# Patient Record
Sex: Male | Born: 1945
Health system: Southern US, Community
[De-identification: ages and names within clinical notes are randomized; demographics above are authoritative.]

## PROBLEM LIST (undated history)

## (undated) DIAGNOSIS — K219 Gastro-esophageal reflux disease without esophagitis: Secondary | ICD-10-CM

## (undated) DIAGNOSIS — F419 Anxiety disorder, unspecified: Secondary | ICD-10-CM

## (undated) DIAGNOSIS — E119 Type 2 diabetes mellitus without complications: Secondary | ICD-10-CM

## (undated) DIAGNOSIS — E669 Obesity, unspecified: Secondary | ICD-10-CM

## (undated) DIAGNOSIS — N200 Calculus of kidney: Secondary | ICD-10-CM

## (undated) DIAGNOSIS — M1990 Unspecified osteoarthritis, unspecified site: Secondary | ICD-10-CM

## (undated) DIAGNOSIS — G4730 Sleep apnea, unspecified: Secondary | ICD-10-CM

## (undated) DIAGNOSIS — D751 Secondary polycythemia: Secondary | ICD-10-CM

## (undated) DIAGNOSIS — I10 Essential (primary) hypertension: Secondary | ICD-10-CM

## (undated) HISTORY — DX: Obesity, unspecified: E66.9

## (undated) HISTORY — DX: Unspecified osteoarthritis, unspecified site: M19.90

## (undated) HISTORY — PX: CHOLECYSTECTOMY: SHX55

## (undated) HISTORY — DX: Sleep apnea, unspecified: G47.30

## (undated) HISTORY — DX: Essential (primary) hypertension: I10

## (undated) HISTORY — DX: Calculus of kidney: N20.0

## (undated) HISTORY — DX: Gastro-esophageal reflux disease without esophagitis: K21.9

## (undated) HISTORY — DX: Type 2 diabetes mellitus without complications: E11.9

## (undated) HISTORY — DX: Secondary polycythemia: D75.1

---

## 1998-06-24 ENCOUNTER — Encounter: Admission: RE | Admit: 1998-06-24 | Discharge: 1998-06-24 | Payer: Self-pay | Admitting: Family Medicine

## 1998-07-02 ENCOUNTER — Encounter: Admission: RE | Admit: 1998-07-02 | Discharge: 1998-07-02 | Payer: Self-pay | Admitting: Family Medicine

## 1998-07-05 ENCOUNTER — Encounter: Admission: RE | Admit: 1998-07-05 | Discharge: 1998-07-05 | Payer: Self-pay | Admitting: Family Medicine

## 1999-05-18 ENCOUNTER — Encounter: Admission: RE | Admit: 1999-05-18 | Discharge: 1999-05-18 | Payer: Self-pay | Admitting: Family Medicine

## 1999-07-13 ENCOUNTER — Encounter: Admission: RE | Admit: 1999-07-13 | Discharge: 1999-07-13 | Payer: Self-pay | Admitting: Family Medicine

## 1999-07-18 ENCOUNTER — Encounter: Admission: RE | Admit: 1999-07-18 | Discharge: 1999-07-18 | Payer: Self-pay | Admitting: Family Medicine

## 1999-08-19 ENCOUNTER — Encounter: Admission: RE | Admit: 1999-08-19 | Discharge: 1999-08-19 | Payer: Self-pay | Admitting: Family Medicine

## 1999-10-31 ENCOUNTER — Encounter: Admission: RE | Admit: 1999-10-31 | Discharge: 1999-10-31 | Payer: Self-pay | Admitting: Family Medicine

## 1999-12-09 ENCOUNTER — Encounter: Admission: RE | Admit: 1999-12-09 | Discharge: 1999-12-09 | Payer: Self-pay | Admitting: Family Medicine

## 1999-12-22 ENCOUNTER — Encounter: Admission: RE | Admit: 1999-12-22 | Discharge: 1999-12-22 | Payer: Self-pay | Admitting: Family Medicine

## 2000-03-01 ENCOUNTER — Encounter: Admission: RE | Admit: 2000-03-01 | Discharge: 2000-03-01 | Payer: Self-pay | Admitting: Family Medicine

## 2000-04-02 ENCOUNTER — Encounter: Admission: RE | Admit: 2000-04-02 | Discharge: 2000-04-02 | Payer: Self-pay | Admitting: Family Medicine

## 2000-05-13 ENCOUNTER — Emergency Department (HOSPITAL_COMMUNITY): Admission: EM | Admit: 2000-05-13 | Discharge: 2000-05-13 | Payer: Self-pay

## 2000-05-31 ENCOUNTER — Encounter: Admission: RE | Admit: 2000-05-31 | Discharge: 2000-05-31 | Payer: Self-pay | Admitting: Family Medicine

## 2000-07-09 ENCOUNTER — Ambulatory Visit (HOSPITAL_COMMUNITY): Admission: RE | Admit: 2000-07-09 | Discharge: 2000-07-09 | Payer: Self-pay | Admitting: Sports Medicine

## 2000-07-12 ENCOUNTER — Encounter: Admission: RE | Admit: 2000-07-12 | Discharge: 2000-07-12 | Payer: Self-pay | Admitting: Family Medicine

## 2000-07-23 ENCOUNTER — Encounter: Admission: RE | Admit: 2000-07-23 | Discharge: 2000-07-23 | Payer: Self-pay | Admitting: Family Medicine

## 2000-10-05 ENCOUNTER — Encounter: Admission: RE | Admit: 2000-10-05 | Discharge: 2000-10-05 | Payer: Self-pay | Admitting: Family Medicine

## 2000-11-06 ENCOUNTER — Encounter: Admission: RE | Admit: 2000-11-06 | Discharge: 2000-11-06 | Payer: Self-pay | Admitting: Sports Medicine

## 2001-02-19 ENCOUNTER — Encounter: Admission: RE | Admit: 2001-02-19 | Discharge: 2001-02-19 | Payer: Self-pay | Admitting: Family Medicine

## 2001-04-30 ENCOUNTER — Encounter: Admission: RE | Admit: 2001-04-30 | Discharge: 2001-04-30 | Payer: Self-pay | Admitting: Family Medicine

## 2001-06-27 ENCOUNTER — Encounter: Admission: RE | Admit: 2001-06-27 | Discharge: 2001-06-27 | Payer: Self-pay | Admitting: Family Medicine

## 2001-08-29 ENCOUNTER — Encounter: Admission: RE | Admit: 2001-08-29 | Discharge: 2001-08-29 | Payer: Self-pay | Admitting: Family Medicine

## 2002-01-02 ENCOUNTER — Encounter: Admission: RE | Admit: 2002-01-02 | Discharge: 2002-01-02 | Payer: Self-pay | Admitting: Family Medicine

## 2002-02-18 ENCOUNTER — Encounter: Admission: RE | Admit: 2002-02-18 | Discharge: 2002-02-18 | Payer: Self-pay | Admitting: Family Medicine

## 2002-06-09 ENCOUNTER — Ambulatory Visit (HOSPITAL_COMMUNITY): Admission: RE | Admit: 2002-06-09 | Discharge: 2002-06-09 | Payer: Self-pay | Admitting: *Deleted

## 2002-06-09 ENCOUNTER — Encounter (INDEPENDENT_AMBULATORY_CARE_PROVIDER_SITE_OTHER): Payer: Self-pay | Admitting: *Deleted

## 2003-02-10 ENCOUNTER — Encounter: Admission: RE | Admit: 2003-02-10 | Discharge: 2003-02-10 | Payer: Self-pay | Admitting: Family Medicine

## 2003-11-23 ENCOUNTER — Encounter: Admission: RE | Admit: 2003-11-23 | Discharge: 2003-11-23 | Payer: Self-pay | Admitting: Family Medicine

## 2004-12-29 ENCOUNTER — Ambulatory Visit: Payer: Self-pay | Admitting: Family Medicine

## 2005-02-10 ENCOUNTER — Ambulatory Visit: Payer: Self-pay | Admitting: Family Medicine

## 2005-04-27 ENCOUNTER — Ambulatory Visit: Payer: Self-pay | Admitting: Family Medicine

## 2005-10-24 ENCOUNTER — Ambulatory Visit: Payer: Self-pay | Admitting: Family Medicine

## 2006-02-13 ENCOUNTER — Ambulatory Visit: Payer: Self-pay | Admitting: Family Medicine

## 2006-06-14 DIAGNOSIS — E669 Obesity, unspecified: Secondary | ICD-10-CM | POA: Insufficient documentation

## 2006-06-14 DIAGNOSIS — N200 Calculus of kidney: Secondary | ICD-10-CM | POA: Insufficient documentation

## 2006-06-14 DIAGNOSIS — J309 Allergic rhinitis, unspecified: Secondary | ICD-10-CM | POA: Insufficient documentation

## 2006-06-14 DIAGNOSIS — I10 Essential (primary) hypertension: Secondary | ICD-10-CM | POA: Insufficient documentation

## 2006-06-14 DIAGNOSIS — G4733 Obstructive sleep apnea (adult) (pediatric): Secondary | ICD-10-CM | POA: Insufficient documentation

## 2006-10-11 ENCOUNTER — Ambulatory Visit: Payer: Self-pay | Admitting: Family Medicine

## 2006-10-11 ENCOUNTER — Ambulatory Visit (HOSPITAL_COMMUNITY): Admission: RE | Admit: 2006-10-11 | Discharge: 2006-10-11 | Payer: Self-pay | Admitting: Family Medicine

## 2006-10-11 LAB — CONVERTED CEMR LAB
BUN: 17 mg/dL (ref 6–23)
CO2: 26 meq/L (ref 19–32)
Calcium: 9.4 mg/dL (ref 8.4–10.5)
Chloride: 97 meq/L (ref 96–112)
Cholesterol: 166 mg/dL (ref 0–200)
Creatinine, Ser: 1.29 mg/dL (ref 0.40–1.50)
Glucose, Bld: 98 mg/dL (ref 70–99)
HDL: 54 mg/dL (ref >39)
LDL Cholesterol: 102 mg/dL — ABNORMAL HIGH (ref 0–99)
PSA: 0.69 ng/mL (ref 0.10–4.00)
Potassium: 4.2 meq/L (ref 3.5–5.3)
Sodium: 139 meq/L (ref 135–145)
Total CHOL/HDL Ratio: 3.1
Triglycerides: 49 mg/dL (ref <150)
VLDL: 10 mg/dL (ref 0–40)

## 2006-10-15 ENCOUNTER — Encounter: Payer: Self-pay | Admitting: Family Medicine

## 2006-10-25 ENCOUNTER — Encounter: Payer: Self-pay | Admitting: Family Medicine

## 2006-11-05 ENCOUNTER — Encounter: Payer: Self-pay | Admitting: Family Medicine

## 2006-11-09 ENCOUNTER — Ambulatory Visit: Payer: Self-pay | Admitting: Family Medicine

## 2006-11-09 ENCOUNTER — Encounter: Payer: Self-pay | Admitting: Family Medicine

## 2006-11-09 LAB — CONVERTED CEMR LAB
HCT: 50.5 % (ref 39.0–52.0)
Hemoglobin: 17.3 g/dL — ABNORMAL HIGH (ref 13.0–17.0)
MCHC: 34.3 g/dL (ref 30.0–36.0)
MCV: 91.8 fL (ref 78.0–100.0)
Platelets: 254 10*3/uL (ref 150–400)
RBC: 5.5 M/uL (ref 4.22–5.81)
RDW: 13.2 % (ref 11.5–14.0)
WBC: 9.7 10*3/uL (ref 4.0–10.5)

## 2006-11-12 ENCOUNTER — Encounter: Payer: Self-pay | Admitting: Family Medicine

## 2006-12-18 ENCOUNTER — Ambulatory Visit (HOSPITAL_COMMUNITY): Admission: RE | Admit: 2006-12-18 | Discharge: 2006-12-18 | Payer: Self-pay | Admitting: Orthopaedic Surgery

## 2007-02-18 ENCOUNTER — Ambulatory Visit: Payer: Self-pay | Admitting: Family Medicine

## 2007-03-05 ENCOUNTER — Ambulatory Visit (HOSPITAL_BASED_OUTPATIENT_CLINIC_OR_DEPARTMENT_OTHER): Admission: RE | Admit: 2007-03-05 | Discharge: 2007-03-05 | Payer: Self-pay | Admitting: Orthopaedic Surgery

## 2007-05-27 ENCOUNTER — Ambulatory Visit: Payer: Self-pay | Admitting: Family Medicine

## 2007-05-28 ENCOUNTER — Telehealth: Payer: Self-pay | Admitting: *Deleted

## 2007-07-11 ENCOUNTER — Telehealth (INDEPENDENT_AMBULATORY_CARE_PROVIDER_SITE_OTHER): Payer: Self-pay | Admitting: *Deleted

## 2007-11-25 ENCOUNTER — Ambulatory Visit: Payer: Self-pay | Admitting: Family Medicine

## 2007-11-25 DIAGNOSIS — R609 Edema, unspecified: Secondary | ICD-10-CM | POA: Insufficient documentation

## 2007-11-25 LAB — CONVERTED CEMR LAB
ALT: 51 units/L (ref 0–53)
AST: 30 units/L (ref 0–37)
Albumin: 4.4 g/dL (ref 3.5–5.2)
Alkaline Phosphatase: 96 units/L (ref 39–117)
BUN: 13 mg/dL (ref 6–23)
CO2: 26 meq/L (ref 19–32)
Calcium: 9.5 mg/dL (ref 8.4–10.5)
Chloride: 101 meq/L (ref 96–112)
Creatinine, Ser: 1.28 mg/dL (ref 0.40–1.50)
Glucose, Bld: 111 mg/dL — ABNORMAL HIGH (ref 70–99)
HCT: 50.5 % (ref 39.0–52.0)
Hemoglobin: 17.6 g/dL — ABNORMAL HIGH (ref 13.0–17.0)
MCHC: 34.9 g/dL (ref 30.0–36.0)
MCV: 88.3 fL (ref 78.0–100.0)
PSA: 0.72 ng/mL (ref 0.10–4.00)
Platelets: 247 10*3/uL (ref 150–400)
Potassium: 4.1 meq/L (ref 3.5–5.3)
Pro B Natriuretic peptide (BNP): 26 pg/mL (ref 0.0–100.0)
RBC: 5.72 M/uL (ref 4.22–5.81)
RDW: 13.1 % (ref 11.5–15.5)
Sodium: 143 meq/L (ref 135–145)
Total Bilirubin: 1.5 mg/dL — ABNORMAL HIGH (ref 0.3–1.2)
Total Protein: 7.5 g/dL (ref 6.0–8.3)
WBC: 10.6 10*3/uL — ABNORMAL HIGH (ref 4.0–10.5)

## 2007-11-26 ENCOUNTER — Encounter: Payer: Self-pay | Admitting: Family Medicine

## 2008-02-24 ENCOUNTER — Ambulatory Visit: Payer: Self-pay | Admitting: Family Medicine

## 2008-02-24 ENCOUNTER — Encounter: Admission: RE | Admit: 2008-02-24 | Discharge: 2008-02-24 | Payer: Self-pay | Admitting: Family Medicine

## 2008-02-24 DIAGNOSIS — R51 Headache: Secondary | ICD-10-CM | POA: Insufficient documentation

## 2008-02-24 DIAGNOSIS — R519 Headache, unspecified: Secondary | ICD-10-CM | POA: Insufficient documentation

## 2008-02-26 ENCOUNTER — Telehealth: Payer: Self-pay | Admitting: *Deleted

## 2008-04-17 HISTORY — PX: HAND SURGERY: SHX662

## 2009-02-02 ENCOUNTER — Emergency Department (HOSPITAL_COMMUNITY): Admission: EM | Admit: 2009-02-02 | Discharge: 2009-02-02 | Payer: Self-pay | Admitting: Emergency Medicine

## 2009-02-15 ENCOUNTER — Ambulatory Visit: Payer: Self-pay | Admitting: Family Medicine

## 2009-02-15 DIAGNOSIS — M549 Dorsalgia, unspecified: Secondary | ICD-10-CM

## 2009-02-15 LAB — CONVERTED CEMR LAB
BUN: 19 mg/dL (ref 6–23)
CO2: 28 meq/L (ref 19–32)
Calcium: 9.8 mg/dL (ref 8.4–10.5)
Chloride: 101 meq/L (ref 96–112)
Creatinine, Ser: 1.19 mg/dL (ref 0.40–1.50)
Glucose, Bld: 101 mg/dL — ABNORMAL HIGH (ref 70–99)
PSA: 0.94 ng/mL (ref 0.10–4.00)
Potassium: 3.8 meq/L (ref 3.5–5.3)
Sodium: 142 meq/L (ref 135–145)

## 2009-02-16 ENCOUNTER — Encounter: Payer: Self-pay | Admitting: Family Medicine

## 2009-05-31 ENCOUNTER — Ambulatory Visit: Payer: Self-pay | Admitting: Family Medicine

## 2010-01-31 ENCOUNTER — Ambulatory Visit: Payer: Self-pay | Admitting: Family Medicine

## 2010-02-09 ENCOUNTER — Encounter: Payer: Self-pay | Admitting: Family Medicine

## 2010-02-09 ENCOUNTER — Ambulatory Visit: Payer: Self-pay | Admitting: Family Medicine

## 2010-05-19 ENCOUNTER — Encounter: Payer: Self-pay | Admitting: *Deleted

## 2010-05-19 NOTE — Assessment & Plan Note (Signed)
Summary: zostavx vaccine/bmc  Nurse Visit   Vital Signs:  Patient profile:   65 year old male Temp:     98.5 degrees F  Vitals Entered By: Theresia Lo RN (February 09, 2010 12:05 PM)  Allergies: No Known Drug Allergies  Immunizations Administered:  Zostavax # 1:    Vaccine Type: Zostavax    Site: Left arm    Mfr: Merck    Dose: 0.65 ml    Route: IM    Given by: Theresia Lo RN    Exp. Date: 05/01/2010    Lot #: [id number]    VIS given: 01/27/05 given February 09, 2010.  Orders Added: 1)  Zoster (Shingles) Vaccine Live [90736] 2)  Admin 1st Vaccine [phone]

## 2010-05-19 NOTE — Assessment & Plan Note (Signed)
Summary: f/u,df   Vital Signs:  Patient profile:   65 year old male Height:      67.5 inches Weight:      222.4 pounds BMI:     34.44 Temp:     97.9 degrees F oral Pulse rate:   52 / minute BP sitting:   137 / 83  (left arm) Cuff size:   regular  Vitals Entered By: Garen Grams LPN (January 31, 2010 4:04 PM) CC: f/u Pain Assessment Patient in pain? yes     Location: right hip   CC:  f/u.  History of Present Illness:  BACK PAIN (ICD-724.5) Was better for a few months but now has returned.  Worse with walking.  As per other episodes the  pain in right sciatic area with radiation down leg  Has had on/off for years since accident in 1980s Tylenol helps with pain some.  No weakness of legs or incontinence of bowel or bladder   HYPERTENSION, BENIGN SYSTEMIC (ICD-401.1) Disease Monitoring   Blood pressure range:  not measuring regularly     Chest pain: N     Dyspnea:N Medications   Compliance: daily   Lightheadedness: N     Edema:None Has lost 20 lbs since last visit!  Edema resolved No shortness of breath or orthopnea   ROS - as above PMH - Medications reviewed and updated in medication list.  Smoking Status noted in VS form       Habits & Providers  Alcohol-Tobacco-Diet     Tobacco Status: never  Current Medications (verified): 1)  Ibuprofen 600 Mg  Tabs (Ibuprofen) .Marland Kitchen.. 1 By Mouth Three Times A Day With Food 2)  Hydrochlorothiazide 25 Mg  Tabs (Hydrochlorothiazide) .... Take 1 Tab By Mouth Every Morning  Allergies: No Known Drug Allergies  Physical Exam  General:  Well-developed,well-nourished,in no acute distress; alert,appropriate and cooperative throughout examination Msk:  Tender over R sacral paraspinous area.  No deformity.  + SLR on R at 80 degrees  Neurologic:  Able to walk on tip toes and heels.  Romberg normal.      Impression & Recommendations:  Problem # 1:  BACK PAIN (ICD-724.5) Assessment Deteriorated  seems most consistent with  sciatica or disc.  He is not interested in surgery currently.  No red flags.  Will try gabapentin and continue tylenol His updated medication list for this problem includes:    Ibuprofen 600 Mg Tabs (Ibuprofen) .Marland Kitchen... 1 by mouth three times a day with food  Orders: [facility]- Est  Level 4 (82956)  Problem # 2:  HYPERTENSION, BENIGN SYSTEMIC (ICD-401.1) Assessment: Improved  improving lost likely due to continued weight loss.  Maybe able to stop medications if can get below 200 lb (his goal) His updated medication list for this problem includes:    Hydrochlorothiazide 25 Mg Tabs (Hydrochlorothiazide) .Marland Kitchen... Take 1 tab by mouth every morning  BP today: 137/83 Prior BP: 150/90 (05/31/2009)  Labs Reviewed: K+: 3.8 (02/15/2009) Creat: : 1.19 (02/15/2009)   Chol: 166 (10/11/2006)   HDL: 54 (10/11/2006)   LDL: 102 (10/11/2006)   TG: 49 (10/11/2006)  Orders: [facility]- Est  Level 4 (21308)  Problem # 3:  EDEMA- LOCALIZED (ICD-782.3) Assessment: Improved nearly gone due to weight loss  His updated medication list for this problem includes:    Hydrochlorothiazide 25 Mg Tabs (Hydrochlorothiazide) .Marland Kitchen... Take 1 tab by mouth every morning  Complete Medication List: 1)  Ibuprofen 600 Mg Tabs (Ibuprofen) .Marland Kitchen.. 1 by mouth three times a day  with food 2)  Hydrochlorothiazide 25 Mg Tabs (Hydrochlorothiazide) .... Take 1 tab by mouth every morning 3)  Gabapentin 100 Mg Caps (Gabapentin) .Marland Kitchen.. 1 three times a day for pain 4)  Triamcinolone Acetonide 0.5 % Oint (Triamcinolone acetonide) .... Apply two times a day to rough areas on elbows and knees.  30 gram  Patient Instructions: 1)  Call me if you have severe pain or any weakness 2)  Use the tylenol as needed for pain and also massage 3)  Use the gabapentin 100 mg once at night for several nights if does not make you sleepy then can take during the day.  If seems to help but makes you sleepy then can take up to 3 at night. 4)  Congrat on the weight loss and blood  pressure control 5)  Consider the [place] Zostavax shot Prescriptions: TRIAMCINOLONE ACETONIDE 0.5 % OINT (TRIAMCINOLONE ACETONIDE) apply two times a day to rough areas on elbows and knees.  30 gram  #1 x 2   Entered and Authorized by:   Pearlean Brownie MD   Signed by:   Pearlean Brownie MD on 01/31/2010   Method used:   Electronically to        [facility] [address] [phone]* (retail)       [address]       [place], [place]  [address]       Ph: [phone]       Fax: [phone]   RxID:   [id number] GABAPENTIN 100 MG CAPS (GABAPENTIN) 1 three times a day for pain  #30 x 1   Entered and Authorized by:   Pearlean Brownie MD   Signed by:   Pearlean Brownie MD on 01/31/2010   Method used:   Electronically to        [facility] [address] [phone]* (retail)       [address]       [place], [place]  [address]       Ph: [phone]       Fax: [phone]   RxID:   [id number]    Orders Added: 1)  [facility]- Est  Level 4 [16010]     Prevention & Chronic Care Immunizations   Influenza vaccine: Fluvax 3+  (02/18/2007)   Influenza vaccine due: 02/18/2008    Tetanus booster: Not documented    Pneumococcal vaccine: Done.  (01/16/2003)   Pneumococcal vaccine due: None    H. zoster vaccine: Not documented  Colorectal Screening   Hemoccult: Done.  (05/18/2000)   Hemoccult due: Not Indicated    Colonoscopy: Done.  (05/18/2002)   Colonoscopy due: 05/18/2012  Other Screening   PSA: 0.94  (02/15/2009)   PSA due due: 10/11/2007   Smoking status: never  (01/31/2010)  Lipids   Total Cholesterol: 166  (10/11/2006)   LDL: 102  (10/11/2006)   LDL Direct: Not documented   HDL: 54  (10/11/2006)   Triglycerides: 49  (10/11/2006)  Hypertension   Last Blood Pressure: 137 / 83  (01/31/2010)   Serum creatinine: 1.19  (02/15/2009)   Serum potassium 3.8  (02/15/2009)    Hypertension flowsheet reviewed?: Yes   Progress toward BP goal: At goal  Self-Management Support :    Personal Goals (by the next clinic visit) :      Personal blood pressure goal: 140/90  (02/15/2009)   Hypertension self-management support: BP self-monitoring log, Written self-care plan  (02/15/2009)

## 2010-05-19 NOTE — Miscellaneous (Signed)
Summary: ABN  ABN   Imported By: De Nurse 02/10/2010 15:13:47  _____________________________________________________________________  External Attachment:    Type:   Image     Comment:   External Document

## 2010-05-19 NOTE — Assessment & Plan Note (Signed)
Summary: f/up,tcb   Vital Signs:  Patient profile:   65 year old male Height:      67.5 inches Weight:      243.1 pounds BMI:     37.65 Temp:     97.5 degrees F oral Pulse rate:   60 / minute BP sitting:   150 / 90  (left arm) Cuff size:   large  Vitals Entered By: Garen Grams LPN (May 31, 2009 3:28 PM)  Serial Vital Signs/Assessments:  Comments: 4:03 PM Manual BP: 150/88 By: Garen Grams LPN   CC: lower back pain with numbness in feet Is Patient Diabetic? No Pain Assessment Patient in pain? yes     Location: lower back Intensity: 6   CC:  lower back pain with numbness in feet.  History of Present Illness: Current Problems:  BACK PAIN (ICD-724.5) has pain in right sciatic area and now with more tingly numbness in his R foot.  Has had for years since accident in 64s but thinks maybe a bit worse.  Usually gets relief with motrin over the counter.  Exercise makes feel better  FACIAL PAIN (ICD-784.0) still pain in R jaw TMJ area.  Is better with motrin.  Worse with chewing.  CT last year was negative.  No hearing problems.  Has not seen dentist  HYPERTENSION, BENIGN SYSTEMIC (ICD-401.1) Disease Monitoring   Blood pressure range:  not measuring regularly     Chest pain: N     Dyspnea:N Medications   Compliance: daily   Lightheadedness: N     Edema:trace in legs at end of day     Habits & Providers  Alcohol-Tobacco-Diet     Tobacco Status: never  Current Medications (verified): 1)  Ibuprofen 600 Mg  Tabs (Ibuprofen) .Marland Kitchen.. 1 By Mouth Three Times A Day With Food 2)  Hydrochlorothiazide 25 Mg  Tabs (Hydrochlorothiazide) .... Take 1 Tab By Mouth Every Morning  Allergies: No Known Drug Allergies  Physical Exam  General:  Well-developed,well-nourished,in no acute distress; alert,appropriate and cooperative throughout examination    Ears:  External ear exam shows no significant lesions or deformities.  Otoscopic examination reveals clear canals, tympanic  membranes are intact bilaterally without bulging, retraction, inflammation or discharge. Hearing is grossly normal bilaterally. Mouth:  Tender over R TMJ area worse with movement No masses  Lungs:  Normal respiratory effort, chest expands symmetrically. Lungs are clear to auscultation, no crackles or wheezes. Heart:  Normal rate and regular rhythm. S1 and S2 normal without gallop, murmur, click, rub or other extra sounds. Extremities:  No straight leg raise pain on R, no skin breakdown on foot, sparse hair growth No palpable pulse  Neurologic:   Able to walk on tip toes and heels.   Monofilament sensation is decreased on R sole compared to L    Impression & Recommendations:  Problem # 1:  BACK PAIN (ICD-724.5) Assessment Unchanged  Most consistent with continued problems from old disc injury in 27s.  Does not have any signs of neuropathy.  Has scant pulses but alble to exercise well without pain.  He is not interested in surgery or imaging but will consider these if worsening Would start with vascular evaluation His updated medication list for this problem includes:    Ibuprofen 600 Mg Tabs (Ibuprofen) .Marland Kitchen... 1 by mouth three times a day with food  Orders: [facility]- Est  Level 4 (84132)  Problem # 2:  FACIAL PAIN (ICD-784.0)  consistent with TMJ syndrome.  Negative CT in past.  Suggested analgesics and to see his dentist  His updated medication list for this problem includes:    Ibuprofen 600 Mg Tabs (Ibuprofen) .Marland Kitchen... 1 by mouth three times a day with food  Orders: [facility]- Est  Level 4 (99214)  Problem # 3:  HYPERTENSION, BENIGN SYSTEMIC (ICD-401.1)  Will not increase medications as long as losing weight and exercising.  continue to monitor  His updated medication list for this problem includes:    Hydrochlorothiazide 25 Mg Tabs (Hydrochlorothiazide) .Marland Kitchen... Take 1 tab by mouth every morning  BP today: 150/90 Prior BP: 160/94 (02/15/2009)  Labs Reviewed: K+: 3.8 (02/15/2009) Creat: :  1.19 (02/15/2009)   Chol: 166 (10/11/2006)   HDL: 54 (10/11/2006)   LDL: 102 (10/11/2006)   TG: 49 (10/11/2006)  Orders: [facility]- Est  Level 4 (16109)  Problem # 4:  OBESITY, NOS (ICD-278.00) lost another 5 lbs.  His goal is to weigh 200.  He would like to see a dietician to help with food choices.  Discussed planning his meals and making on the weekend since he is too tired to cook well during the week.    Complete Medication List: 1)  Ibuprofen 600 Mg Tabs (Ibuprofen) .Marland Kitchen.. 1 by mouth three times a day with food 2)  Hydrochlorothiazide 25 Mg Tabs (Hydrochlorothiazide) .... Take 1 tab by mouth every morning  Patient Instructions: 1)  Needs appointment with Dr Gerily[facility] 2)  Keep a record of your blood pressure readings if regularly > 150/90 call us and bring in next visit 3)  Come back in 6 mo to check blood pressure and back 4)  If back pain is worseing or if numbness is worse call us - would get blood vessel first

## 2010-07-21 LAB — POCT URINALYSIS DIP (DEVICE)
Ketones, ur: NEGATIVE mg/dL
Protein, ur: NEGATIVE mg/dL
pH: 5 (ref 5.0–8.0)

## 2010-08-30 NOTE — Op Note (Signed)
NAME:  Donald Jones, Donald Jones NO.:  [id number]   MEDICAL RECORD NO.:  [id number]          PATIENT TYPE:  AMB   LOCATION:  DSC                          FACILITY:  [facility]   PHYSICIAN:  Vanita Panda. Magnus Ivan, M.D.DATE OF BIRTH:  May 03, 1945   DATE OF PROCEDURE:  03/05/2007  DATE OF DISCHARGE:  03/05/2007                               OPERATIVE REPORT   PREOPERATIVE DIAGNOSIS:  Right index finger irritation with infection  versus retained suture.   POSTOPERATIVE DIAGNOSIS:  Right index finger soft tissue reaction from  retained suture.   PROCEDURE:  Irrigation and debridement of right index finger with  removal of retained suture.   SURGEON:  Vanita Panda. Magnus Ivan, M.D.   ANESTHESIA:  Regional Bier block, right upper extremity.   BLOOD LOSS:  Minimal.   ANTIBIOTICS:  1 g IV Ancef.   COMPLICATIONS:  None.   INDICATIONS:  Briefly, Donald Jones is a 65 year old right-hand-dominant  male who just a few months ago injured his right index finger in a work-  related incident, and he had a partial extensor tendon injury, and he  was taken to the operating room and had repair of the extensor tendon.  He subsequently did well with therapy and had gone back to its baseline  level of activity, but he continued to have a chronic wound develop over  the MCP joint at the site of the suture repair.  We tried whirlpool  treatments on this through [facility], and he was treated  with a course of antibiotics, but he never did seem to be infected, but  I felt like this was more of a irritation from retained suture and his  body's reaction to a retained suture.  I recommended he undergo  exploration of the wound for assessment of infection versus removal of  retained suture.  The risks and benefits of this were explained to him  who understood and he agreed to proceed with surgery.   PROCEDURE:  After informed consent was obtained, the appropriate right  arm was marked,  he was brought to the operating room and placed upon the  operating table.  Bier block was obtained of the right upper extremity  by anesthesia.  His skin was prepped and draped with DuraPrep and  sterile drapes, a time-out was called to identify the correct patient  and correct extremity.  I then used a 15-blade and made incision  directly over the wound at the level of the irritated tissue.  I took  this down to the deep tissues of the extensor tendon.  There was a large  retained Ethibond suture and I believe this was what was causing the  reaction of the soft tissues.  There was a large knot to this and I  removed this sharply with a knife.  There was no evidence of infection  whatsoever.  I used a rongeur to debride irritated tissue in order to  get a good bleeding healing response.  Since he was awake during the  case, I had him flex and extend the finger and the extensor tendon  repair held and he had good motion.  I then irrigated the wound  copiously with normal saline solution and then closed the skin with  interrupted 3-0 nylon suture in a simple format.  The wound was  infiltrated with 0.25% plain Marcaine.  I then placed Xeroform and a  well-padded sterile dressing on the finger.  The tourniquet was let down  at 20 minutes.  Hemostasis was obtained and the fingers did  pinken nicely.  He was taken to the recovery room in stable condition.  Postoperatively I will allow him to use the hand.  Sutures will remain  in place for 2 weeks.  I will keep him out of work for the rest of this  week and I will let him resume strenuous activity in another 2 weeks.      Vanita Panda. Magnus Ivan, M.D.  Electronically Signed     CYB/MEDQ  D:  03/05/2007  T:  03/06/2007  Job:  [id number]

## 2010-08-30 NOTE — Op Note (Signed)
NAME:  Donald Jones, Donald Jones NO.:  [id number]   MEDICAL RECORD NO.:  [id number]          PATIENT TYPE:  AMB   LOCATION:  SDS                          FACILITY:  [facility]   PHYSICIAN:  Vanita Panda. Magnus Ivan, M.D.DATE OF BIRTH:  13-Dec-1945   DATE OF PROCEDURE:  12/18/2006  DATE OF DISCHARGE:                               OPERATIVE REPORT   PREOPERATIVE DIAGNOSIS:  Right index finger extensor tendon laceration.   POSTOPERATIVE DIAGNOSIS:  Right index finger 70% laceration of extensor  tendon/hood.   PROCEDURE:  Direct primary repair of right index finger extensor tendon  laceration.   SURGEON:  Vanita Panda. Magnus Ivan, M.D.   ANESTHESIA:  General.   ANTIBIOTICS:  1 gram IV Ancef.   ESTIMATED BLOOD LOSS:  Minimal.   COMPLICATIONS:  None.   TOURNIQUET TIME:  12 minutes.   INDICATIONS:  Briefly, [name] is a 61-year right hand dominant  individual who injured his index finger at work last Thursday when he  was cut with a metal object.  He was seen at an Ur[facility] and  the physician there told me that there was a laceration of at least 50-  75% of the extensor tendon at the MCP joint.  The soft tissue was closed  with sutures, he was placed in a sling, and given follow up in my  office.  Given the extent of the injury, I recommend that he undergo  direct primary repair. The risks and benefits of this were explained,  were well understood, and he agreed to proceed with surgery.   DESCRIPTION OF PROCEDURE:  After informed consent was obtained and the  appropriate right hand was marked, [name] was brought to the  operating room and placed supine on the operating table.  General  anesthesia was then obtained. A nonsterile tourniquet was placed around  his upper arm and his right hand was prepped and draped with Betadine  scrub and paint.  A time out was called to identify the correct patient  and correct extremity.  We then elevated the tourniquet to  250 mmHg.  The small wound on the dorsum of the hand was transverse at the MCP  joint.  I extended this proximally and distally and at the extensor  hood, you could see that the ulnar side was completely lacerated up  until the mid portion of the tendon.  I was able to irrigate and clean  the tissues and I brought the extensor hood back together using  interrupted 3-0 Ethibond suture.  I irrigated the tissues again and  closed the skin with interrupted 3-0 Prolene suture.  Xeroform followed  by a well padded sterile dressing and splint were placed on the finger.  The tourniquet was let down 12 minutes and the fingers pinkened nicely.  The patient was awakened, extubated, and taken to the recovery room in  stable condition.   Postoperatively, I will put him on an extensor tendon repair protocol  but it will likely be a shortened protocol since the tendon was about  75% lacerated, but it was more the extensor hood,  will probable put him  quickly into the dynamic active passive splinting.      Vanita Panda. Magnus Ivan, M.D.  Electronically Signed     CYB/MEDQ  D:  12/18/2006  T:  12/18/2006  Job:  [id number]

## 2010-12-26 ENCOUNTER — Encounter: Payer: Self-pay | Admitting: Internal Medicine

## 2011-01-17 ENCOUNTER — Encounter: Payer: Self-pay | Admitting: Family Medicine

## 2011-01-17 ENCOUNTER — Ambulatory Visit (INDEPENDENT_AMBULATORY_CARE_PROVIDER_SITE_OTHER): Payer: BC Managed Care – PPO | Admitting: Internal Medicine

## 2011-01-17 ENCOUNTER — Encounter: Payer: Self-pay | Admitting: Internal Medicine

## 2011-01-17 VITALS — BP 124/78 | HR 61 | Temp 98.2°F | Resp 14 | Ht 66.75 in | Wt 218.5 lb

## 2011-01-17 DIAGNOSIS — I10 Essential (primary) hypertension: Secondary | ICD-10-CM

## 2011-01-17 DIAGNOSIS — Z23 Encounter for immunization: Secondary | ICD-10-CM

## 2011-01-17 DIAGNOSIS — R51 Headache: Secondary | ICD-10-CM

## 2011-01-17 MED ORDER — HYDROCHLOROTHIAZIDE 25 MG PO TABS: 25.0000 mg | ORAL_TABLET | Freq: Every day | ORAL | Status: DC

## 2011-01-17 NOTE — Assessment & Plan Note (Signed)
Pain c/w TMJ rec advil as he is doing, ice, see a dentist

## 2011-01-17 NOTE — Progress Notes (Signed)
  Subjective:    Patient ID: Donald Jones, male    DOB: [birth year], 65 y.o.   MRN: [id number]  HPI New patient, to get establish Hypertension, good medication compliance. Sleep apnea, he used to use a CPAP 5 years ago decided to loose wt, he reports a 80 pound weight loss, since then he is feeling well and discontinued his CPAP. Also complaining of pain in the right TMJ, worse when chewing. He does not have a dentist, he is edentulous , has removable partials   Past Medical History  Diagnosis Date  . Sleep apnea     not using a CPAP  since ~ 2007  . Hypertension   . Obesity   . Kidney stones     several episodes    Past Surgical History  Procedure Date  . Cholecystectomy   . Hand surgery 2010    R hand , d/t a injury, 3 surgeries    History   Social History  . Marital Status: Married    Spouse Name: N/A    Number of Children: 3   . 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 Smoking status: Never Smoker   . Smokeless tobacco: Never Used  . Alcohol Use: No  . Drug Use: No  . Sexually Active: Not on file   Other Topics Concern  . Not on file   Social History Narrative   1ST WIFE DIED FROM LUNG CANCER, 2ND WIFE DIED FROM RENAL CANCER. NOW IS ENGAGED .   Family History  Problem Relation Age of Onset  . Diabetes Mother   . Stroke      M and F  . Coronary artery disease      M?  . Colon cancer Neg Hx   . Prostate cancer Neg Hx      Review of Systems Denies any chest pain, shortness of breath No nausea, vomiting, diarrhea.     Objective:   Physical Exam  Constitutional: He is oriented to person, place, and time. He appears well-developed. No distress.  HENT:  Head: Normocephalic and atraumatic.  Right Ear: External ear normal.       Mouth opening is limited, removable denture seems loose. Throat without redness or discharge. TMJ slightly tender on the right, no click  Cardiovascular: Normal rate,  regular rhythm and normal heart sounds.   No murmur heard. Pulmonary/Chest: Breath sounds normal. No respiratory distress. He has no wheezes. He has no rales.  Musculoskeletal: He exhibits no edema.  Neurological: He is alert and oriented to person, place, and time.  Skin: He is not diaphoretic.          Assessment & Plan:

## 2011-01-17 NOTE — Patient Instructions (Signed)
Advil, ice at night, see a dentist    TMJ Problems  (Temporal Mandibular Joint Dysfunction) TMJ dysfunction means there are problems with the joint between your jaw and your skull. This is a joint lined by cartilage like other joints in your body but also has a small disc in the joint which keeps the bones from rubbing on each other. These joints are like other joints and can get inflamed (sore) from arthritis and other problems. When this joint gets sore, it can cause headaches and pain in the jaw and the face. CAUSES Usually the arthritic types of problems are caused by soreness in the joint. Soreness in the joint can also be caused by overuse. This may come from grinding your teeth. It may also come from mis-alignment in the joint. DIAGNOSIS (LEARNING WHAT IS WRONG) Diagnosis of this condition can often be made by history and exam. Sometimes your caregiver may need X-rays or an MRI scan to determine the exact cause. It may be necessary to see your dentist to determine if your teeth and jaws are lined up correctly. TREATMENT Most of the time this problem is not serious; however, sometimes it can persist (become chronic). When this happens medications that will cut down on inflammation (soreness) help. Sometimes a shot of cortisone into the joint will be helpful. If your teeth are not aligned it may help for your dentist to make a splint for your mouth that can help this problem. If no physical problems can be found, the problem may come from tension. If tension is found to be the cause, biofeedback or relaxation techniques may be helpful. HOME CARE INSTRUCTIONS  Later in the day, applications of ice packs may be helpful. Ice can be used in a plastic bag with a towel around it to prevent frostbite to skin. This may be used about every 2 hours for 20 to 30 minutes, as needed while awake, or as directed by your caregiver.   Only take over-the-counter or prescription medicines for pain, discomfort, or  fever as directed by your caregiver.   If physical therapy was prescribed, follow your caregiver's directions.   Wear mouth appliances as directed if they were given.  Document Released: 12/27/2000 Document Re-Released: 06/30/2008 [facility] Patient Information 2011 [place], [place].

## 2011-01-17 NOTE — Assessment & Plan Note (Signed)
Reports good compliance with medications, BP today normal. Will refill his medications, labs

## 2011-01-18 ENCOUNTER — Telehealth: Payer: Self-pay | Admitting: *Deleted

## 2011-01-18 LAB — CBC WITH DIFFERENTIAL/PLATELET
Basophils Absolute: 0 10*3/uL (ref 0.0–0.1)
Eosinophils Absolute: 0.3 10*3/uL (ref 0.0–0.7)
Lymphocytes Relative: 54.8 % — ABNORMAL HIGH (ref 12.0–46.0)
MCHC: 33.2 g/dL (ref 30.0–36.0)
MCV: 96.4 fl (ref 78.0–100.0)
Monocytes Absolute: 0.7 10*3/uL (ref 0.1–1.0)
Neutrophils Relative %: 26.4 % — ABNORMAL LOW (ref 43.0–77.0)
Platelets: 216 10*3/uL (ref 150.0–400.0)
RDW: 13.3 % (ref 11.5–14.6)

## 2011-01-18 LAB — BASIC METABOLIC PANEL
BUN: 15 mg/dL (ref 6–23)
CO2: 30 mEq/L (ref 19–32)
Calcium: 9.7 mg/dL (ref 8.4–10.5)
Chloride: 102 mEq/L (ref 96–112)
Creatinine, Ser: 1.2 mg/dL (ref 0.4–1.5)
Glucose, Bld: 102 mg/dL — ABNORMAL HIGH (ref 70–99)

## 2011-01-18 NOTE — Telephone Encounter (Signed)
MD informed

## 2011-01-20 NOTE — Progress Notes (Signed)
Quick Note:    Labs mailed.  ______

## 2011-01-24 LAB — BASIC METABOLIC PANEL
CO2: 33 — ABNORMAL HIGH
Calcium: 9.5
Creatinine, Ser: 1.35
GFR calc Af Amer: >60
GFR calc non Af Amer: 54 — ABNORMAL LOW
Sodium: 141

## 2011-01-24 LAB — POCT HEMOGLOBIN-HEMACUE
Hemoglobin: 19 — ABNORMAL HIGH
Operator id: 128471

## 2011-01-27 LAB — BASIC METABOLIC PANEL
BUN: 11
Calcium: 9.5
GFR calc non Af Amer: >60
Glucose, Bld: 109 — ABNORMAL HIGH
Potassium: 3.9
Sodium: 139

## 2011-01-27 LAB — CBC
HCT: 52.9 — ABNORMAL HIGH
Hemoglobin: 18.2 — ABNORMAL HIGH
Platelets: 274
RDW: 12.9
WBC: 11.3 — ABNORMAL HIGH

## 2011-06-05 ENCOUNTER — Ambulatory Visit (INDEPENDENT_AMBULATORY_CARE_PROVIDER_SITE_OTHER): Payer: BC Managed Care – PPO | Admitting: Internal Medicine

## 2011-06-05 DIAGNOSIS — M21371 Foot drop, right foot: Secondary | ICD-10-CM | POA: Insufficient documentation

## 2011-06-05 DIAGNOSIS — Z0000 Encounter for general adult medical examination without abnormal findings: Secondary | ICD-10-CM

## 2011-06-05 DIAGNOSIS — I10 Essential (primary) hypertension: Secondary | ICD-10-CM

## 2011-06-05 DIAGNOSIS — M549 Dorsalgia, unspecified: Secondary | ICD-10-CM

## 2011-06-05 NOTE — Progress Notes (Signed)
  Subjective:    Patient ID: Donald Jones, male    DOB: [birth year], 66 y.o.   MRN: [id number]  HPI CPX  Past medical history Hypertension Sleep apnea, diagnosed in 16-Sep-2005, not using CPAP Nephrolithiasis Back pain, chronic . Used to get shots  R foot  drop , used to see neurology  Past surgical history Cholecystectomy Hand surgery x3, right side, d/t injury  Social history Moved from [place] in 09/16/1993 1st wife died from lung cancer, 2nd wife died from renal cancer; engaged at present  3 children Occupation Aeronautical engineer Tobacco, never Alcohol, no  FH DM-- M Stroke--M F CAD-- ?M Colon ca--no Prostate ca--no  Review of Systems doing well. He reports that 5 years ago, he decided to exercise more, he lost weight. Since then he is doing better, no chest pain, no shortness of breath. Denies nausea, vomiting, diarrhea. occ sees blood in the toilet paper only with hard bowel movements. No difficulty urinating or blood in the urine. No anxiety or depression. History of foot drop, getting worse?.     Objective:   Physical Exam  Constitutional: He is oriented to person, place, and time. He appears well-developed. No distress.  Neck: No thyromegaly present.  Cardiovascular: Normal rate, regular rhythm and normal heart sounds.   No murmur heard.      Nl femoral pulses B  Pulmonary/Chest: Effort normal and breath sounds normal. No respiratory distress. He has no wheezes. He has no rales.  Abdominal: Soft. Bowel sounds are normal. He exhibits no distension. There is no tenderness. There is no rebound.  Genitourinary: Rectum normal and prostate normal.  Musculoskeletal:       No lower extremity edema.   Neurological: He is alert and oriented to person, place, and time.       Speech and gait are normal. Right foot flexion decreased. DTRs are symmetric. There is mild muscular atrophy noted at the distal right lower extremity  Skin: He is not diaphoretic.       Assessment & Plan:

## 2011-06-05 NOTE — Assessment & Plan Note (Signed)
Td ~ 2009 per pt Pneumonia shot 2004 Shingles shot 2011  cscope ~ 2008 in [place], @ ?day surgery, no report available--- will call the hospital Counseled: diet, exercise Labs

## 2011-06-05 NOTE — Assessment & Plan Note (Signed)
Uses 4 to 6 advils a day as needed Will  reassess on RTC, consider alternative meds (Mobic?)

## 2011-06-05 NOTE — Assessment & Plan Note (Signed)
Refer to PT-- brace?

## 2011-06-05 NOTE — Patient Instructions (Signed)
Please come back fasting: CMP, CBC, TSH, FLP, PSA--- dx v70 Check the  blood pressure 2 or 3 times a week, be sure it is less than 140/85. If it is consistently higher, let me know

## 2011-06-05 NOTE — Assessment & Plan Note (Signed)
BP slt elevated, see instructions

## 2011-06-06 ENCOUNTER — Encounter: Payer: Self-pay | Admitting: Internal Medicine

## 2011-06-06 ENCOUNTER — Other Ambulatory Visit: Payer: BC Managed Care – PPO

## 2011-06-07 ENCOUNTER — Other Ambulatory Visit (INDEPENDENT_AMBULATORY_CARE_PROVIDER_SITE_OTHER): Payer: BC Managed Care – PPO

## 2011-06-07 DIAGNOSIS — Z0000 Encounter for general adult medical examination without abnormal findings: Secondary | ICD-10-CM

## 2011-06-07 LAB — CBC WITH DIFFERENTIAL/PLATELET
Basophils Relative: 0.3 % (ref 0.0–3.0)
Eosinophils Absolute: 0.2 10*3/uL (ref 0.0–0.7)
Eosinophils Relative: 3.8 % (ref 0.0–5.0)
Hemoglobin: 18.3 g/dL (ref 13.0–17.0)
Lymphocytes Relative: 32.8 % (ref 12.0–46.0)
MCHC: 34.1 g/dL (ref 30.0–36.0)
MCV: 94.4 fl (ref 78.0–100.0)
Monocytes Absolute: 0.4 10*3/uL (ref 0.1–1.0)
Neutro Abs: 3.4 10*3/uL (ref 1.4–7.7)
RBC: 5.67 Mil/uL (ref 4.22–5.81)
WBC: 6.1 10*3/uL (ref 4.5–10.5)

## 2011-06-07 LAB — COMPREHENSIVE METABOLIC PANEL
ALT: 36 U/L (ref 0–53)
AST: 25 U/L (ref 0–37)
Albumin: 4.3 g/dL (ref 3.5–5.2)
CO2: 29 mEq/L (ref 19–32)
Calcium: 9.1 mg/dL (ref 8.4–10.5)
Chloride: 100 mEq/L (ref 96–112)
GFR: 65.51 mL/min (ref >60.00)
Potassium: 4.2 mEq/L (ref 3.5–5.1)

## 2011-06-07 LAB — LIPID PANEL: Cholesterol: 157 mg/dL (ref 0–200)

## 2011-06-08 ENCOUNTER — Encounter: Payer: Self-pay | Admitting: *Deleted

## 2011-06-19 ENCOUNTER — Ambulatory Visit: Payer: BC Managed Care – PPO | Admitting: *Deleted

## 2011-06-23 ENCOUNTER — Ambulatory Visit: Payer: BC Managed Care – PPO

## 2011-06-23 ENCOUNTER — Ambulatory Visit (INDEPENDENT_AMBULATORY_CARE_PROVIDER_SITE_OTHER): Payer: BC Managed Care – PPO | Admitting: Family Medicine

## 2011-06-23 VITALS — BP 150/94 | HR 67 | Temp 98.4°F | Resp 16 | Ht 66.0 in | Wt 220.0 lb

## 2011-06-23 DIAGNOSIS — R112 Nausea with vomiting, unspecified: Secondary | ICD-10-CM

## 2011-06-23 DIAGNOSIS — R1084 Generalized abdominal pain: Secondary | ICD-10-CM

## 2011-06-23 DIAGNOSIS — E860 Dehydration: Secondary | ICD-10-CM

## 2011-06-23 DIAGNOSIS — K5289 Other specified noninfective gastroenteritis and colitis: Secondary | ICD-10-CM

## 2011-06-23 DIAGNOSIS — R1011 Right upper quadrant pain: Secondary | ICD-10-CM

## 2011-06-23 DIAGNOSIS — R197 Diarrhea, unspecified: Secondary | ICD-10-CM

## 2011-06-23 DIAGNOSIS — K529 Noninfective gastroenteritis and colitis, unspecified: Secondary | ICD-10-CM

## 2011-06-23 LAB — POCT UA - MICROSCOPIC ONLY
Bacteria, U Microscopic: NEGATIVE
Casts, Ur, LPF, POC: NEGATIVE
Crystals, Ur, HPF, POC: NEGATIVE
Mucus, UA: POSITIVE
RBC, urine, microscopic: NEGATIVE
Yeast, UA: NEGATIVE

## 2011-06-23 LAB — POCT CBC
Granulocyte percent: 93.2 %G — AB (ref 37–80)
HCT, POC: 58.7 % — AB (ref 43.5–53.7)
Hemoglobin: 19.7 g/dL — AB (ref 14.1–18.1)
Lymph, poc: 0.7 (ref 0.6–3.4)
MCH, POC: 31.7 pg — AB (ref 27–31.2)
MCHC: 33.6 g/dL (ref 31.8–35.4)
MCV: 94.5 fL (ref 80–97)
MID (cbc): 0.6 (ref 0–0.9)
MPV: 9.3 fL (ref 0–99.8)
POC Granulocyte: 17.1 — AB (ref 2–6.9)
POC LYMPH PERCENT: 3.6 %L — AB (ref 10–50)
POC MID %: 3.2 % (ref 0–12)
Platelet Count, POC: 258 10*3/uL (ref 142–424)
RBC: 6.21 M/uL — AB (ref 4.69–6.13)
RDW, POC: 14 %
WBC: 18.3 10*3/uL — AB (ref 4.6–10.2)

## 2011-06-23 LAB — POCT URINALYSIS DIPSTICK
Bilirubin, UA: NEGATIVE
Glucose, UA: NEGATIVE
Ketones, UA: NEGATIVE
Leukocytes, UA: NEGATIVE
Nitrite, UA: NEGATIVE
Protein, UA: NEGATIVE
Spec Grav, UA: 1.02
Urobilinogen, UA: 1
pH, UA: 6

## 2011-06-23 LAB — GLUCOSE, POCT (MANUAL RESULT ENTRY): POC Glucose: 179

## 2011-06-23 MED ORDER — CIPROFLOXACIN HCL 500 MG PO TABS: 500.0000 mg | ORAL_TABLET | Freq: Two times a day (BID) | ORAL | Status: AC

## 2011-06-23 MED ORDER — SODIUM CHLORIDE 0.9 % IV SOLN: 8.0000 mg | Freq: Once | INTRAVENOUS | Status: DC

## 2011-06-23 MED ORDER — PROMETHAZINE HCL 25 MG PO TABS: 25.0000 mg | ORAL_TABLET | Freq: Three times a day (TID) | ORAL | Status: AC | PRN

## 2011-06-23 MED ORDER — ONDANSETRON HCL 4 MG/2ML IJ SOLN
8.0000 mg | Freq: Once | INTRAMUSCULAR | Status: AC
  Administered 2011-06-23: 8 mg via INTRAVENOUS

## 2011-06-23 NOTE — Progress Notes (Signed)
[facility]:  Office Visit  Chief Complaint:  Chief Complaint  Patient presents with  . Nausea    x today  . Abdominal Pain    x today  zantac  . Vomiting    x today  . Diarrhea    x today  watery    HPI: Donald Jones is a 66 y.o. male who complains of nonradiating acute onset of diffuse abdominal pain started at 2 AM. Associated with chills, nausea, nonbilious vomiting, nonbloody diarrhea. 5-6 episodes of vomiting, 6-7 episodes of diarrhea. Recent visit to restauarnat and ate chicken and pizza < 24 hours ago. Has a h/o of renal stones but does not feel like it, did not take BP meds due to vomiting. + sick contact with similar sxs in son. No recent abx, meds, travels. Tried pushing fluids but unable to maintain PO intake.   Past Medical History  Diagnosis Date  . Sleep apnea     not using a CPAP  since ~ 2007  . Hypertension   . Obesity   . Kidney stones     several episodes    Past Surgical History  Procedure Date  . Cholecystectomy   . Hand surgery 2010    R hand , d/t a injury, 3 surgeries    History   Social History  . Marital Status: Widowed    Spouse Name: N/A    Number of Children: 3   . 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 Smoking status: Never Smoker   . Smokeless tobacco: Never Used  . Alcohol Use: No  . Drug Use: No  . Sexually Active: None   Other Topics Concern  . None   Social History Narrative   1ST WIFE DIED FROM LUNG CANCER, 2ND WIFE DIED FROM RENAL CANCER. NOW IS ENGAGED .   Family History  Problem Relation Age of Onset  . Diabetes Mother   . Stroke      M and F  . Coronary artery disease      M?  . Colon cancer Neg Hx   . Prostate cancer Neg Hx    No Known Allergies Prior to Admission medications   Medication Sig Start Date End Date Taking? Authorizing Provider  hydrochlorothiazide (HYDRODIURIL) 25 MG tablet Take 1 tablet (25 mg total) by mouth  daily. Every morning 01/17/11  Yes Wanda Plump, MD  ibuprofen (ADVIL,MOTRIN) 600 MG tablet Take 600 mg by mouth. Three times a day with food    Yes Historical Provider, MD  Multiple Vitamin (MULTIVITAMIN) tablet Take 1 tablet by mouth daily.     Yes Historical Provider, MD     ROS: The patient denies fevers, chills, night sweats, unintentional weight loss, chest pain, palpitations, wheezing, dyspnea on exertion, dysuria, hematuria, melena, numbness, weakness, or tingling. + n/v/abd pain  All other systems have been reviewed and were otherwise negative with the exception of those mentioned in the HPI and as above.    PHYSICAL EXAM: Filed Vitals:   06/23/11 1130  BP: 150/94  Pulse: 67  Temp: 98.4 F (36.9 C)  Resp: 16   There were no vitals filed for this visit. There is no height or weight on file to calculate BMI.  General: Alert, no acute distress, obese HEENT:  Normocephalic, atraumatic, oropharynx patent. Oral mucosa dry, TM normal Cardiovascular:  Regular rate and rhythm, no rubs murmurs or gallops.  No  Carotid bruits, radial pulse intact. No pedal edema.  Respiratory: Clear to auscultation bilaterally.  No wheezes, rales, or rhonchi.  No cyanosis, no use of accessory musculature GI: No organomegaly, abdomen is soft and non-tender, + decrease bowel sounds.  No masses. Skin: No rashes. Neurologic: Facial musculature symmetric. Psychiatric: Patient is appropriate throughout our interaction. Lymphatic: No cervical lymphadenopathy Musculoskeletal: Gait intact.   LABS: Results for orders placed in visit on 06/23/11  POCT CBC      Component Value Range   WBC 18.3 (*) 4.6 - 10.2 (K/uL)   Lymph, poc 0.7  0.6 - 3.4    POC LYMPH PERCENT 3.6 (*) 10 - 50 (%L)   MID (cbc) 0.6  0 - 0.9    POC MID % 3.2  0 - 12 (%M)   POC Granulocyte 17.1 (*) 2 - 6.9    Granulocyte percent 93.2 (*) 37 - 80 (%G)   RBC 6.21 (*) 4.69 - 6.13 (M/uL)   Hemoglobin 19.7 (*) 14.1 - 18.1 (g/dL)   HCT, POC  16.1 (*) 09.6 - 53.7 (%)   MCV 94.5  80 - 97 (fL)   MCH, POC 31.7 (*) 27 - 31.2 (pg)   MCHC 33.6  31.8 - 35.4 (g/dL)   RDW, POC 04.5     Platelet Count, POC 258  142 - 424 (K/uL)   MPV 9.3  0 - 99.8 (fL)  GLUCOSE, POCT (MANUAL RESULT ENTRY)      Component Value Range   POC Glucose 179       EKG/XRAY:   Primary read interpreted by Dr. Conley Rolls at [facility]. Abd and CXR no acute process    ASSESSMENT/PLAN: Encounter Diagnoses  Name Primary?  . Acute generalized abdominal pain Yes  . Nausea & vomiting   . Dehydration   . Diarrhea     Gastroenteritis-viral vs more likely bacterial GI infection. Stool to be collected at home for stool WBC. Cipro 500 mg BID x 10 days.  Phenergen 25 mg q6-8hrs.  Patient received 2 bags of NS IVF and felt better afterwards.    Md Smola PHUONG, DO 06/23/2011 1:23 PM

## 2011-06-24 LAB — COMPREHENSIVE METABOLIC PANEL
BUN: 19 mg/dL (ref 6–23)
CO2: 27 mEq/L (ref 19–32)
Calcium: 10 mg/dL (ref 8.4–10.5)
Chloride: 97 mEq/L (ref 96–112)
Creat: 1.18 mg/dL (ref 0.50–1.35)

## 2011-06-24 LAB — COMPREHENSIVE METABOLIC PANEL WITH GFR
ALT: 49 U/L (ref 0–53)
AST: 51 U/L — ABNORMAL HIGH (ref 0–37)
Albumin: 4.9 g/dL (ref 3.5–5.2)
Alkaline Phosphatase: 82 U/L (ref 39–117)
Glucose, Bld: 147 mg/dL — ABNORMAL HIGH (ref 70–99)
Potassium: 5 meq/L (ref 3.5–5.3)
Sodium: 140 meq/L (ref 135–145)
Total Bilirubin: 2.9 mg/dL — ABNORMAL HIGH (ref 0.3–1.2)
Total Protein: 7.5 g/dL (ref 6.0–8.3)

## 2011-08-28 ENCOUNTER — Other Ambulatory Visit: Payer: Self-pay | Admitting: Internal Medicine

## 2011-08-28 NOTE — Telephone Encounter (Signed)
Refill done.  

## 2011-11-06 ENCOUNTER — Telehealth: Payer: Self-pay | Admitting: Family Medicine

## 2011-11-06 NOTE — Telephone Encounter (Signed)
LM for patient to call me if he has abd pain still. Told him about elevated bili.

## 2012-02-14 ENCOUNTER — Ambulatory Visit
Admission: RE | Admit: 2012-02-14 | Discharge: 2012-02-14 | Disposition: A | Discharge status: Home / Self Care | Payer: BC Managed Care – PPO | Source: Ambulatory Visit | Attending: Emergency Medicine | Admitting: Emergency Medicine

## 2012-02-14 ENCOUNTER — Telehealth: Payer: Self-pay | Admitting: Family Medicine

## 2012-02-14 ENCOUNTER — Ambulatory Visit: Payer: BC Managed Care – PPO

## 2012-02-14 ENCOUNTER — Ambulatory Visit (INDEPENDENT_AMBULATORY_CARE_PROVIDER_SITE_OTHER): Payer: BC Managed Care – PPO | Admitting: Emergency Medicine

## 2012-02-14 ENCOUNTER — Other Ambulatory Visit: Payer: Self-pay | Admitting: Physician Assistant

## 2012-02-14 VITALS — BP 132/78 | HR 58 | Temp 98.0°F | Resp 17 | Ht 65.5 in | Wt 210.0 lb

## 2012-02-14 DIAGNOSIS — D751 Secondary polycythemia: Secondary | ICD-10-CM

## 2012-02-14 DIAGNOSIS — R109 Unspecified abdominal pain: Secondary | ICD-10-CM

## 2012-02-14 DIAGNOSIS — R7989 Other specified abnormal findings of blood chemistry: Secondary | ICD-10-CM

## 2012-02-14 DIAGNOSIS — D45 Polycythemia vera: Secondary | ICD-10-CM

## 2012-02-14 LAB — COMPREHENSIVE METABOLIC PANEL
Albumin: 4.7 g/dL (ref 3.5–5.2)
BUN: 16 mg/dL (ref 6–23)
Calcium: 10.1 mg/dL (ref 8.4–10.5)
Chloride: 99 mEq/L (ref 96–112)
Glucose, Bld: 91 mg/dL (ref 70–99)
Potassium: 4.1 mEq/L (ref 3.5–5.3)

## 2012-02-14 LAB — POCT URINALYSIS DIPSTICK
Bilirubin, UA: NEGATIVE
Blood, UA: NEGATIVE
Glucose, UA: NEGATIVE
Spec Grav, UA: >=1.03

## 2012-02-14 LAB — POCT UA - MICROSCOPIC ONLY
Bacteria, U Microscopic: NEGATIVE
Casts, Ur, LPF, POC: NEGATIVE
Epithelial cells, urine per micros: NEGATIVE
Mucus, UA: NEGATIVE

## 2012-02-14 LAB — IFOBT (OCCULT BLOOD): IFOBT: POSITIVE

## 2012-02-14 LAB — POCT CBC
HCT, POC: 59.2 % — AB (ref 43.5–53.7)
Hemoglobin: 19.2 g/dL — AB (ref 14.1–18.1)
MCH, POC: 32.1 pg — AB (ref 27–31.2)
MPV: 9 fL (ref 0–99.8)
POC MID %: 6.1 %M (ref 0–12)
RBC: 5.99 M/uL (ref 4.69–6.13)
WBC: 9.3 10*3/uL (ref 4.6–10.2)

## 2012-02-14 MED ORDER — IOHEXOL 300 MG/ML  SOLN
125.0000 mL | Freq: Once | INTRAMUSCULAR | Status: AC | PRN
  Administered 2012-02-14: 125 mL via INTRAVENOUS

## 2012-02-14 MED ORDER — DICYCLOMINE HCL 20 MG PO TABS: 20.0000 mg | ORAL_TABLET | Freq: Four times a day (QID) | ORAL | Status: DC

## 2012-02-14 NOTE — Progress Notes (Signed)
Subjective:    Patient ID: Donald Jones, male    DOB: 09-27-1945, 66 y.o.   MRN: [id number]  HPI patient enters with onset for 48 hours ago of upper and lower abdominal pain associated with an aching in his back. He has not had any definite fever but he has had chills. He has had frequent loose stools during the day. He has had some mild nausea but no vomiting. His son is also been ill but his son's illness has resolved. He is status post cholecystectomy. He has not had any recent travel nor has he been on antibiotics recently.    Review of Systems     Objective:   Physical Exam HEENT exam is unremarkable I could not demonstrate any scleral icterus throat was clear chest was clear to auscultation and percussion. Cardiac exam revealed a regular rate without murmurs the abdomen appears slightly distended he the most significant area of tenderness is deep in the left lower quadrant. There is some mild upper abdominal discomfort rectal exam revealed a normal sized prostate with loose yellowish material which was sent for heme sure  [facility] reading (PRIMARY) by  Dr. Cleta Alberts no acute disease no evidence of obstruction no free air.  Results for orders placed in visit on 02/14/12  POCT CBC      Component Value Range   WBC 9.3  4.6 - 10.2 K/uL   Lymph, poc 2.4  0.6 - 3.4   POC LYMPH PERCENT 25.7  10 - 50 %L   MID (cbc) 0.6  0 - 0.9   POC MID % 6.1  0 - 12 %M   POC Granulocyte 6.3  2 - 6.9   Granulocyte percent 68.2  37 - 80 %G   RBC 5.99  4.69 - 6.13 M/uL   Hemoglobin 19.2 (*) 14.1 - 18.1 g/dL   HCT, POC 40.9 (*) 81.1 - 53.7 %   MCV 98.8 (*) 80 - 97 fL   MCH, POC 32.1 (*) 27 - 31.2 pg   MCHC 32.4  31.8 - 35.4 g/dL   RDW, POC 91.4     Platelet Count, POC 273  142 - 424 K/uL   MPV 9.0  0 - 99.8 fL  POCT UA - MICROSCOPIC ONLY      Component Value Range   WBC, Ur, HPF, POC neg     RBC, urine, microscopic neg     Bacteria, U Microscopic neg     Mucus, UA neg     Epithelial cells, urine per  micros neg     Crystals, Ur, HPF, POC neg     Casts, Ur, LPF, POC neg     Yeast, UA neg    POCT URINALYSIS DIPSTICK      Component Value Range   Color, UA yellow     Clarity, UA clear     Glucose, UA neg     Bilirubin, UA neg     Ketones, UA trace     Spec Grav, UA >=1.030     Blood, UA neg     pH, UA 5.0     Protein, UA neg     Urobilinogen, UA 0.2     Nitrite, UA neg     Leukocytes, UA Negative    IFOBT (OCCULT BLOOD)      Component Value Range   IFOBT Positive     Patient began drinking barium contrast for CT scan at 2pm.-A. Reis     Assessment & Plan:  Patient here  with change in bowel habits heme positive stool. Does have a significantly high hemoglobin raising the possibility of an ischemic issue with his bowel. We'll set him up to see GI to an emergency T. abdomen and pelvis today I will give him some Bentyl for cramping if necessary. Referral will also be made to hematology to evaluate his polycythemia .

## 2012-02-14 NOTE — Telephone Encounter (Signed)
Called patient and fiance Diane with results of CT. Per Dr. Cleta Alberts CT showed no reason for diarrhea or blood in stool. It did show DDD low back and  A slightly enlarged prostate. Follow up with GI come back in if worsening or not better. We sent in an RX for Bentyl he needs to start.

## 2012-02-14 NOTE — Progress Notes (Signed)
Spoke with Dr. Cleta Alberts, given CT results. Will call in Bentyl per his request and notify patient. Patient is to follow up with GI referral.

## 2012-02-14 NOTE — Patient Instructions (Addendum)
Please go to [facility] for CT abdomen pelvis. Referrals have been made to GI and hematology

## 2012-02-14 NOTE — Telephone Encounter (Signed)
Patients fiance returned call. She was notified and voiced understanding. She will let [place] know. If he has any questions suggested him give [place] a call.

## 2012-02-15 ENCOUNTER — Telehealth: Payer: Self-pay | Admitting: Oncology

## 2012-02-15 ENCOUNTER — Ambulatory Visit: Payer: BC Managed Care – PPO

## 2012-02-15 ENCOUNTER — Ambulatory Visit: Payer: BC Managed Care – PPO | Admitting: Oncology

## 2012-02-15 ENCOUNTER — Other Ambulatory Visit: Payer: BC Managed Care – PPO | Admitting: Lab

## 2012-02-15 NOTE — Telephone Encounter (Signed)
S/W pt in re NP appt 11/08 @ 3 w/ Dr. Gaylyn Rong Referring Dr. Cleta Alberts Dx-Polycythemia Welcome packet mailed.

## 2012-02-23 ENCOUNTER — Other Ambulatory Visit (HOSPITAL_BASED_OUTPATIENT_CLINIC_OR_DEPARTMENT_OTHER): Payer: BC Managed Care – PPO

## 2012-02-23 ENCOUNTER — Ambulatory Visit: Payer: BC Managed Care – PPO

## 2012-02-23 ENCOUNTER — Encounter: Payer: Self-pay | Admitting: Oncology

## 2012-02-23 ENCOUNTER — Ambulatory Visit (HOSPITAL_BASED_OUTPATIENT_CLINIC_OR_DEPARTMENT_OTHER): Payer: BC Managed Care – PPO | Admitting: Oncology

## 2012-02-23 ENCOUNTER — Telehealth: Payer: Self-pay | Admitting: Oncology

## 2012-02-23 VITALS — BP 154/87 | HR 57 | Temp 97.2°F | Resp 20 | Ht 65.5 in | Wt 216.0 lb

## 2012-02-23 DIAGNOSIS — D751 Secondary polycythemia: Secondary | ICD-10-CM

## 2012-02-23 DIAGNOSIS — D45 Polycythemia vera: Secondary | ICD-10-CM

## 2012-02-23 NOTE — Patient Instructions (Addendum)
1.  Issue:  Elevated red blood cell counts.  2.  Potential causes:  Smoking, diuretics, COPD, sleep apnea.  Rarely, it can be due to polycythemia vera (PV).  PV is associated with increased risk of blood clot.  Rarely, elevated red blood cell can be seen in kidney cancer.   3.  Work up:  JAK-2 mutation to rule out PV.  Serum erythropoietin to rule out exogenous source of production (such as kidney cancer).  If there is blood in urine, flank pain, fever, we may consider CT abdomen.  4.  Treatment:  If PV is confirmed, then hydrea pill and aspirin to decrease risk of blood clot.

## 2012-02-23 NOTE — Telephone Encounter (Signed)
appts made and printed for pt aom °

## 2012-02-23 NOTE — Progress Notes (Signed)
[facility]  [phone] [phone]     INITIAL HEMATOLOGY CONSULTATION    Referral MD:  [name], M.D.   Reason for Referral: polycythemia.    HPI: Donald Jones is a 66 year-old man with OSA, not on CPAP since 2007.  He has had recurrent kidney stone where he presents with abdominal pain.  He presented to ED where CT abdomen was negative.  However, he was noted to have chronic polycythemia ever since record was kept at [facility] since 2008.  He was thus kindly referred to the [facility] for evaluation.  Donald Jones presented to clinic for the first time today with his son.  He reported snoring at night.  He sleeps by himself and thus does not know if he has apneic episodes.  However, he does wake up in the morning not feeling well rested at all. He denies fever, anorexia, weight loss, fatigue, headache, visual changes, confusion, drenching night sweats, palpable lymph node swelling, mucositis, odynophagia, dysphagia, nausea vomiting, jaundice, chest pain, palpitation, shortness of breath, dyspnea on exertion, productive cough, gum bleeding, epistaxis, hematemesis, hemoptysis, abdominal pain, abdominal swelling, early satiety, melena, hematochezia, hematuria, skin rash, spontaneous bleeding, joint swelling, joint pain, heat or cold intolerance, bowel bladder incontinence, back pain, focal motor weakness, paresthesia, depression, suicidal or homicidal ideation, feeling hopelessness.     Past Medical History  Diagnosis Date  . Sleep apnea     not using a CPAP  since ~ 2007  . Hypertension   . Obesity     100-lb weight loss since 2011.   . Kidney stones     several episodes.  Last one in 2011.    Marland Kitchen GERD (gastroesophageal reflux disease)   :    Past Surgical History  Procedure Date  . Cholecystectomy   . Hand surgery 2010    R hand , d/t a injury, 3 surgeries   . Colonoscopy     [facility]  Neg.   :   CURRENT MEDS: Current Outpatient  Prescriptions  Medication Sig Dispense Refill  . acetaminophen (TYLENOL) 325 MG tablet Take 650 mg by mouth every 6 (six) hours as needed.      . dicyclomine (BENTYL) 20 MG tablet Take 1 tablet (20 mg total) by mouth every 6 (six) hours.  40 tablet  1  . hydrochlorothiazide (HYDRODIURIL) 25 MG tablet TAKE 1 TABLET BY MOUTH EVERY MORNING  90 tablet  3  . Multiple Vitamin (MULTIVITAMIN) tablet Take 1 tablet by mouth daily.            Allergies  Allergen Reactions  . Sulfa Antibiotics Nausea And Vomiting  :  Family History  Problem Relation Age of Onset  . Diabetes Mother   . Stroke      M and F  . Coronary artery disease      M?  . Colon cancer Neg Hx   . Prostate cancer Neg Hx   . Stroke Father   . Heart disease Sister   . Cancer Sister   . Alcohol abuse Sister   . Cancer Sister     colon  :  History   Social History  . Marital Status: Widowed    Spouse Name: N/A    Number of Children: 3   . 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 Smoking status: Never Smoker   . Smokeless tobacco: Never Used  .  Alcohol Use: No  . Drug Use: No  . Sexually Active: No   Other Topics Concern  . Not on file   Social History Narrative   1ST WIFE DIED FROM LUNG CANCER, 2ND WIFE DIED FROM RENAL CANCER. NOW IS ENGAGED .  :  REVIEW OF SYSTEM:  The rest of the 14-point review of sytem was negative.   Exam: ECOG 0  General:  well-nourished man, in no acute distress.  Eyes:  no scleral icterus.  ENT:  There were no oropharyngeal lesions.  Neck was without thyromegaly.  Lymphatics:  Negative cervical, supraclavicular or axillary adenopathy.  Respiratory: lungs were clear bilaterally without wheezing or crackles.  Cardiovascular:  Regular rate and rhythm, S1/S2, without murmur, rub or gallop.  There was no pedal edema.  GI:  abdomen was soft, flat, nontender, nondistended, without organomegaly.  Muscoloskeletal:  no spinal  tenderness of palpation of vertebral spine.  Skin exam was without echymosis, petichae.  Neuro exam was nonfocal.  Patient was able to get on and off exam table without assistance.  Gait was normal.  Patient was alerted and oriented.  Attention was good.   Language was appropriate.  Mood was normal without depression.  Speech was not pressured.  Thought content was not tangential.    LABS:  Lab Results  Component Value Date   WBC 9.3 02/14/2012   HGB 19.2* 02/14/2012   HCT 59.2* 02/14/2012   PLT 207.0 06/07/2011   GLUCOSE 91 02/14/2012   CHOL 157 06/07/2011   TRIG 42.0 06/07/2011   HDL 49.00 06/07/2011   LDLCALC 100* 06/07/2011   ALT 28 02/14/2012   AST 21 02/14/2012   NA 138 02/14/2012   K 4.1 02/14/2012   CL 99 02/14/2012   CREATININE 1.07 02/14/2012   BUN 16 02/14/2012   CO2 29 02/14/2012   PSA 0.94 02/15/2009    Ct Abdomen Pelvis W Contrast  02/14/2012  *RADIOLOGY REPORT*  Clinical Data: Mid to upper abdominal pain for 2 days, nausea, diarrhea, melena  CT ABDOMEN AND PELVIS WITH CONTRAST  Technique:  Multidetector CT imaging of the abdomen and pelvis was performed following the standard protocol during bolus administration of intravenous contrast.  Contrast: OMNIPAQUE IOHEXOL 300 MG/ML  SOLN  Comparison: Abdomen films of 02/14/2012  Findings: The lung bases are clear.  The liver enhances with no focal abnormality and no ductal dilatation is seen.  The gallbladder appears have been resected.  The pancreas is normal in size and the pancreatic duct is not dilated.  The adrenal glands and spleen are unremarkable.  The stomach is moderately distended with no abnormality noted.  The kidneys enhance with no calculus or mass and no hydronephrosis is seen.  The abdominal aorta is normal in caliber.  The mesenteric vasculature appears normal.  The urinary bladder is not well distended but is unremarkable.  The prostate is slightly prominent measuring 4.2 x 5.2 cm.  No abnormality of the colon  is seen.  The terminal ileum and the appendix are unremarkable.  Degenerative disc disease is present particularly at L4-5 and L5-S1.  No small bowel distention is seen.  IMPRESSION:  1.  No explanation for the patient's nausea and diarrhea is seen. The appendix and terminal ileum appear normal. 2.  Slightly prominent prostate. 3.  Degenerative disc disease at L4-5 and L5-S1.   Original Report Authenticated By: Juline Patch, M.D.     ASSESSMENT AND PLAN:   1.  OSA:  Not on CPAP.  2.  HTN:  On HCTZ diuretics. 3.  History of 2nd hand smoke without ssx of COPD.  4.  Recurrent kidney stone.   5.  Polycythemia:  -Potential causes:  diuretics, sleep apnea.  Rarely, it can be due to polycythemia vera (PV).  PV is associated with increased risk of blood clot.  Rarely, elevated red blood cell can be seen in kidney cancer.  His abdominal CT in 01/2012 was negative for renal mass.  - Work up:  JAK-2 mutation to rule out PV.  Serum erythropoietin to rule out exogenous source of production (such as kidney cancer). -Treatment:  If PV is confirmed, then hydrea pill and aspirin to decrease risk of blood clot. I advised him to resume CPAP to see if his Hgb normalizes - Follow up:  Lab only appointment in about 6 months.  Return visit in about 1 year.      Thank you for this referral.   The length of time of the face-to-face encounter was 45 minutes. More than 50% of time was spent counseling and coordination of care.

## 2012-03-05 ENCOUNTER — Encounter: Payer: Self-pay | Admitting: Oncology

## 2012-03-19 ENCOUNTER — Other Ambulatory Visit: Payer: Self-pay | Admitting: Physician Assistant

## 2012-04-22 ENCOUNTER — Ambulatory Visit (INDEPENDENT_AMBULATORY_CARE_PROVIDER_SITE_OTHER): Payer: BC Managed Care – PPO | Admitting: Internal Medicine

## 2012-04-22 ENCOUNTER — Encounter: Payer: Self-pay | Admitting: Internal Medicine

## 2012-04-22 VITALS — BP 140/82 | HR 56 | Temp 98.1°F | Ht 66.5 in | Wt 215.0 lb

## 2012-04-22 DIAGNOSIS — M21371 Foot drop, right foot: Secondary | ICD-10-CM

## 2012-04-22 DIAGNOSIS — Z0000 Encounter for general adult medical examination without abnormal findings: Secondary | ICD-10-CM

## 2012-04-22 DIAGNOSIS — D45 Polycythemia vera: Secondary | ICD-10-CM

## 2012-04-22 DIAGNOSIS — G4730 Sleep apnea, unspecified: Secondary | ICD-10-CM

## 2012-04-22 DIAGNOSIS — D751 Secondary polycythemia: Secondary | ICD-10-CM

## 2012-04-22 DIAGNOSIS — K589 Irritable bowel syndrome without diarrhea: Secondary | ICD-10-CM

## 2012-04-22 DIAGNOSIS — I10 Essential (primary) hypertension: Secondary | ICD-10-CM

## 2012-04-22 MED ORDER — DICYCLOMINE HCL 20 MG PO TABS: 20.0000 mg | ORAL_TABLET | Freq: Three times a day (TID) | ORAL | Status: DC | PRN

## 2012-04-22 NOTE — Assessment & Plan Note (Signed)
Was seen with GI problems few months ago at another clinic, CT was negative, was prescribed until good results. IBS? Plan: Refill Bentyl for now

## 2012-04-22 NOTE — Assessment & Plan Note (Addendum)
Td ~ 2009 per pt Pneumonia shot 2004 Shingles shot 2011 Had a flu shot   cscope ~ 2008 in [place], @ ?day surgery, no report available---> refer to GI, has a family history of colon cancer Counseled: diet, exercise Labs  EKG normal sinus rhythm

## 2012-04-22 NOTE — Patient Instructions (Signed)
Please come back fasting: FLP, PSA--- dx v70 --- Check the  blood pressure 2 or 3 times a week, be sure it is between 110/60 and 140/85. If it is consistently higher or lower, let me know

## 2012-04-22 NOTE — Assessment & Plan Note (Signed)
Long  history of right foot drop, was referred to PT but the insurance issues was not able to go

## 2012-04-22 NOTE — Progress Notes (Signed)
  Subjective:    Patient ID: Donald Jones, male    DOB: Mar 10, 1946, 67 y.o.   MRN: [id number]  HPI Request a CPX  Past Medical History  Diagnosis Date  . Sleep apnea   . Hypertension   . Obesity     100-lb weight loss since 2011.   . Kidney stones     several episodes.  Last one in 2011.    Marland Kitchen GERD (gastroesophageal reflux disease)   . Polycythemia     Past Surgical History  Procedure Date  . Cholecystectomy   . Hand surgery 2010    R hand , d/t a injury, 3 surgeries   . Colonoscopy    History   Social History  . Marital Status: Widowed    Spouse Name: N/A    Number of Children: 3   . 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 Smoking status: Never Smoker   . Smokeless tobacco: Never Used  . Alcohol Use: Yes  . Drug Use: No  . Sexually Active: No   Other Topics Concern  . Not on file   Social History Narrative   1ST WIFE DIED FROM LUNG CANCER, 2ND WIFE DIED FROM RENAL CANCER. Engaged    Family History  Problem Relation Age of Onset  . Diabetes Mother   . Stroke      M and F  . Coronary artery disease      M?  . Colon cancer Sister     age ~ 65  . Prostate cancer Neg Hx   . Stroke Father   . Heart disease Sister     ?  Marland Kitchen Alcohol abuse Sister     Review of Systems Since the last time he was here, was eval for abd pain, CT was (-), doing well w/ bentyl Also was seen by hematology d/t polycythemia denies CP-SOB No N-V-D- blood in stools  No dysuria-gross hematuria No anxiety or depression    Objective:   Physical Exam General -- alert, well-developed, and overweight appearing.   Neck --no thyromegaly Lungs -- normal respiratory effort, no intercostal retractions, no accessory muscle use, and normal breath sounds.   Heart-- normal rate, regular rhythm, no murmur, and no gallop.   Abdomen--soft, non-tender, no distention, no masses, no HSM, no guarding, and no rigidity.   Extremities-- no  pretibial edema bilaterally Rectal-- No external abnormalities noted. Normal sphincter tone. No rectal masses or tenderness. Brown stool, Hemoccult negative Prostate:  Prostate gland firm and smooth, no enlargement, nodularity, tenderness, mass, asymmetry or induration. Neurologic-- alert & oriented X3 and strength normal in all extremities. Psych-- Cognition and judgment appear intact. Alert and cooperative with normal attention span and concentration.  not anxious appearing and not depressed appearing.       Assessment & Plan:

## 2012-04-22 NOTE — Assessment & Plan Note (Signed)
Continue with hydrochlorothiazide, see instructions

## 2012-04-22 NOTE — Assessment & Plan Note (Addendum)
Dx years ago, restarted use Cpap ~ 02-2012 when he was diagnosed with polycythemia Plan--- refer to pulmonary , likely needs his settings adjusted

## 2012-04-23 ENCOUNTER — Encounter: Payer: Self-pay | Admitting: Gastroenterology

## 2012-04-26 ENCOUNTER — Other Ambulatory Visit (INDEPENDENT_AMBULATORY_CARE_PROVIDER_SITE_OTHER): Payer: BC Managed Care – PPO

## 2012-04-26 ENCOUNTER — Telehealth: Payer: Self-pay | Admitting: Internal Medicine

## 2012-04-26 DIAGNOSIS — Z5181 Encounter for therapeutic drug level monitoring: Secondary | ICD-10-CM

## 2012-04-26 DIAGNOSIS — Z79899 Other long term (current) drug therapy: Secondary | ICD-10-CM

## 2012-04-26 LAB — LIPID PANEL
LDL Cholesterol: 104 mg/dL — ABNORMAL HIGH (ref 0–99)
Total CHOL/HDL Ratio: 3

## 2012-04-26 NOTE — Telephone Encounter (Signed)
Pt is here and asked to make sure that the colonoscopy needs to be coded as a preventative and his insurance will pay. If not he will have a bill. He would like you to call him.

## 2012-04-29 NOTE — Telephone Encounter (Signed)
All coding and billing related to patient's Colonoscopy is up to the provider who will perform the procedure.  Patient will be given this information when he goes to his pre-visit appointment on 05/13/12.  I left message of above on patient's phone.

## 2012-05-07 ENCOUNTER — Ambulatory Visit (INDEPENDENT_AMBULATORY_CARE_PROVIDER_SITE_OTHER): Payer: BC Managed Care – PPO | Admitting: Pulmonary Disease

## 2012-05-07 ENCOUNTER — Encounter: Payer: Self-pay | Admitting: Pulmonary Disease

## 2012-05-07 VITALS — Ht 67.5 in | Wt 223.2 lb

## 2012-05-07 DIAGNOSIS — G4730 Sleep apnea, unspecified: Secondary | ICD-10-CM

## 2012-05-07 NOTE — Patient Instructions (Addendum)
Will get you started back on cpap with a new machine, and will re-optimize your pressure on the automatic setting.  Will let you know your level. Work on continued weight loss If doing well, would like to see you back in 6mos.  Please call if having issues with your cpap.

## 2012-05-07 NOTE — Progress Notes (Signed)
  Subjective:    Patient ID: Donald Jones, male    DOB: [birth year], 67 y.o.   MRN: [id number]  HPI The patient is a 67 year old male who I've been asked to see for management of obstructive sleep apnea.  He was diagnosed with sleep apnea in 2003, but his sleep study is currently not available.  He was started on CPAP at that time by otolaryngology, and used compliantly until approximately one year ago.  He started exercising more, and lost 70 pounds, and felt much better.  However, in November of last year was diagnosed as having polycythemia, and nocturnal hypoxemia was felt to be the culprit.  He has since restarted his CPAP machine, but it is not functioning properly, and he is not getting benefit.  He continues to have loud snoring, but unclear if he has an abnormal breathing pattern during sleep.  He is not rested most mornings upon arising, and notes significant sleep pressured during the day with periods of inactivity.  He can also get sleepiness while driving.  Of note, his Epworth score today is 13.     Review of Systems  Constitutional: Positive for unexpected weight change (EXPECTED decrease with exercise). Negative for fever.  HENT: Positive for nosebleeds, congestion, rhinorrhea and dental problem. Negative for ear pain, sore throat, sneezing, trouble swallowing, postnasal drip and sinus pressure.   Eyes: Negative for redness and itching.  Respiratory: Negative for cough, chest tightness, shortness of breath and wheezing.   Cardiovascular: Negative for palpitations and leg swelling.  Gastrointestinal: Positive for abdominal pain ( treated with medication). Negative for nausea and vomiting.       Acid heartburn  Genitourinary: Negative for dysuria.  Musculoskeletal: Positive for joint swelling.  Skin: Negative for rash.  Neurological: Negative for headaches.  Hematological: Does not bruise/bleed easily.  Psychiatric/Behavioral: Negative for dysphoric mood. The patient is not  nervous/anxious.        Objective:   Physical Exam Constitutional:  Obese male, no acute distress  HENT:  Nares patent without discharge  Oropharynx without exudate, palate and uvula are very thick and elongated.   Eyes:  Perrla, eomi, no scleral icterus  Neck:  No JVD, no TMG  Cardiovascular:  Normal rate, regular rhythm, no rubs or gallops.  No murmurs        Intact distal pulses  Pulmonary :  Normal breath sounds, no stridor or respiratory distress   No rales, rhonchi, or wheezing  Abdominal:  Soft, nondistended, bowel sounds present.  No tenderness noted.   Musculoskeletal:  No lower extremity edema noted.  Lymph Nodes:  No cervical lymphadenopathy noted  Skin:  No cyanosis noted  Neurologic:  Alert, appropriate, moves all 4 extremities without obvious deficit.         Assessment & Plan:

## 2012-05-07 NOTE — Assessment & Plan Note (Signed)
The patient has a history of obstructive sleep apnea dating back to 2003, and has responded well to CPAP in the past.  He has lost significant weight, but obviously still has significant symptoms off CPAP.  It is very unlikely that his sleep apnea has resolved.  He now has polycythemia, and this potentially could be secondary to nocturnal hypoxemia from his sleep apnea.  At this point, I would like to get him back on CPAP, and have his pressure optimized again.  His machine is over 34 years old, and will obviously need to be replaced.  I have also encouraged him to continue working on aggressive weight loss.

## 2012-05-13 ENCOUNTER — Ambulatory Visit (AMBULATORY_SURGERY_CENTER): Payer: BC Managed Care – PPO | Admitting: *Deleted

## 2012-05-13 ENCOUNTER — Encounter: Payer: Self-pay | Admitting: Gastroenterology

## 2012-05-13 ENCOUNTER — Telehealth: Payer: Self-pay | Admitting: *Deleted

## 2012-05-13 VITALS — Ht 67.0 in | Wt 221.8 lb

## 2012-05-13 DIAGNOSIS — Z1211 Encounter for screening for malignant neoplasm of colon: Secondary | ICD-10-CM

## 2012-05-13 MED ORDER — MOVIPREP 100 G PO SOLR: ORAL | Status: DC

## 2012-05-13 NOTE — Telephone Encounter (Signed)
Release has been faxed  

## 2012-05-13 NOTE — Progress Notes (Signed)
Patient's last colonoscopy was 06-09-02 with Dr.Peter Santogade. Release of information signed and given to [facility].

## 2012-05-13 NOTE — Telephone Encounter (Signed)
Patient for Direct Screening colon on 05-27-12. Patient had last colonoscopy with Dr.Peter Santogade on 06-09-2002. Release of information filled out and signed by pt. Will give to [name].

## 2012-05-27 ENCOUNTER — Ambulatory Visit (AMBULATORY_SURGERY_CENTER): Payer: BC Managed Care – PPO | Admitting: Gastroenterology

## 2012-05-27 ENCOUNTER — Encounter: Payer: Self-pay | Admitting: Gastroenterology

## 2012-05-27 VITALS — BP 123/77 | HR 54 | Temp 97.5°F | Resp 18 | Ht 67.0 in | Wt 221.0 lb

## 2012-05-27 DIAGNOSIS — K5730 Diverticulosis of large intestine without perforation or abscess without bleeding: Secondary | ICD-10-CM

## 2012-05-27 DIAGNOSIS — Z800 Family history of malignant neoplasm of digestive organs: Secondary | ICD-10-CM

## 2012-05-27 DIAGNOSIS — Z1211 Encounter for screening for malignant neoplasm of colon: Secondary | ICD-10-CM

## 2012-05-27 MED ORDER — SODIUM CHLORIDE 0.9 % IV SOLN: 500.0000 mL | INTRAVENOUS | Status: DC

## 2012-05-27 NOTE — Op Note (Signed)
[facility] [address] [place], [address]   COLONOSCOPY PROCEDURE REPORT  PATIENT: Donald, Jones  MR#: [id number] BIRTHDATE: [birth year] , 66  yrs. old GENDER: Male ENDOSCOPIST: Rachael Fee, MD REFERRED XB:JYNW Drue Novel, M.D. PROCEDURE DATE:  05/27/2012 PROCEDURE:   Colonoscopy, diagnostic and Colonoscopy, screening ASA CLASS:   Class III INDICATIONS: elevated risk screening, sister had colon cancer MEDICATIONS: Fentanyl 50 mcg IV, Versed 5 mg IV, and These medications were titrated to patient response per physician's verbal order  DESCRIPTION OF PROCEDURE:   After the risks benefits and alternatives of the procedure were thoroughly explained, informed consent was obtained.  A digital rectal exam revealed no abnormalities of the rectum.   The LB CF-H180AL [id number] and LB CF-Q180AL [id number]  endoscope was introduced through the anus and advanced to the cecum, which was identified by both the appendix and ileocecal valve. No adverse events experienced.   The quality of the prep was good, using MoviPrep  The instrument was then slowly withdrawn as the colon was fully examined.   COLON FINDINGS: There were a few small diverticulum in left side. The examination was otherwise normal.  Retroflexed views revealed no abnormalities. The time to cecum=1 minutes 38 seconds. Withdrawal time=6 minutes 40 seconds.  The scope was withdrawn and the procedure completed. COMPLICATIONS: There were no complications.  ENDOSCOPIC IMPRESSION: There were a few small diverticulum in left side. The examination was otherwise normal.  No polyps or cancers  RECOMMENDATIONS: Given your significant family history of colon cancer (sister had colon cancer), you should have a repeat colonoscopy in 5 years   eSigned:  Rachael Fee, MD 05/27/2012 8:39 AM

## 2012-05-27 NOTE — Patient Instructions (Signed)
YOU HAD AN ENDOSCOPIC PROCEDURE TODAY AT THE [facility]: Refer to the procedure report that was given to you for any specific questions about what was found during the examination.  If the procedure report does not answer your questions, please call your gastroenterologist to clarify.  If you requested that your care partner not be given the details of your procedure findings, then the procedure report has been included in a sealed envelope for you to review at your convenience later.  YOU SHOULD EXPECT: Some feelings of bloating in the abdomen. Passage of more gas than usual.  Walking can help get rid of the air that was put into your GI tract during the procedure and reduce the bloating. If you had a lower endoscopy (such as a colonoscopy or flexible sigmoidoscopy) you may notice spotting of blood in your stool or on the toilet paper. If you underwent a bowel prep for your procedure, then you may not have a normal bowel movement for a few days.  DIET: Your first meal following the procedure should be a light meal and then it is ok to progress to your normal diet.  A half-sandwich or bowl of soup is an example of a good first meal.  Heavy or fried foods are harder to digest and may make you feel nauseous or bloated.  Likewise meals heavy in dairy and vegetables can cause extra gas to form and this can also increase the bloating.  Drink plenty of fluids but you should avoid alcoholic beverages for 24 hours.  ACTIVITY: Your care partner should take you home directly after the procedure.  You should plan to take it easy, moving slowly for the rest of the day.  You can resume normal activity the day after the procedure however you should NOT DRIVE or use heavy machinery for 24 hours (because of the sedation medicines used during the test).    SYMPTOMS TO REPORT IMMEDIATELY: A gastroenterologist can be reached at any hour.  During normal business hours, 8:30 AM to 5:00 PM Monday through Friday,  call [phone].  After hours and on weekends, please call the GI answering service at [phone] who will take a message and have the physician on call contact you.   Following lower endoscopy (colonoscopy or flexible sigmoidoscopy):  Excessive amounts of blood in the stool  Significant tenderness or worsening of abdominal pains  Swelling of the abdomen that is new, acute  Fever of 100F or higher    FOLLOW UP: If any biopsies were taken you will be contacted by phone or by letter within the next 1-3 weeks.  Call your gastroenterologist if you have not heard about the biopsies in 3 weeks.  Our staff will call the home number listed on your records the next business day following your procedure to check on you and address any questions or concerns that you may have at that time regarding the information given to you following your procedure. This is a courtesy call and so if there is no answer at the home number and we have not heard from you through the emergency physician on call, we will assume that you have returned to your regular daily activities without incident.  SIGNATURES/CONFIDENTIALITY: You and/or your care partner have signed paperwork which will be entered into your electronic medical record.  These signatures attest to the fact that that the information above on your After Visit Summary has been reviewed and is understood.  Full responsibility of the confidentiality   of this discharge information lies with you and/or your care-partner.     

## 2012-05-27 NOTE — Progress Notes (Signed)
Patient did not have preoperative order for IV antibiotic SSI prophylaxis. (G8918)  Patient did not experience any of the following events: a burn prior to discharge; a fall within the facility; wrong site/side/patient/procedure/implant event; or a hospital transfer or hospital admission upon discharge from the facility. (G8907)  

## 2012-05-28 ENCOUNTER — Telehealth: Payer: Self-pay

## 2012-05-28 NOTE — Telephone Encounter (Signed)
Left message on answering machine. 

## 2012-06-04 ENCOUNTER — Telehealth: Payer: Self-pay | Admitting: Gastroenterology

## 2012-06-04 NOTE — Telephone Encounter (Signed)
Forward 4 pages from [facility] to Dr. Rob Bunting for review on 06-04-12 ym

## 2012-06-08 ENCOUNTER — Telehealth: Payer: Self-pay | Admitting: Gastroenterology

## 2012-06-08 NOTE — Telephone Encounter (Signed)
Colonoscopy Dr. Luther Parody 05/2002 for positive FOBT; no polyps described, was recommended to have repeat colonoscopy at 10 year interval

## 2012-08-22 ENCOUNTER — Other Ambulatory Visit: Payer: BC Managed Care – PPO | Admitting: Lab

## 2012-09-17 ENCOUNTER — Encounter (HOSPITAL_COMMUNITY): Payer: Self-pay | Admitting: Emergency Medicine

## 2012-09-17 ENCOUNTER — Encounter: Payer: Self-pay | Admitting: Family Medicine

## 2012-09-17 ENCOUNTER — Emergency Department (HOSPITAL_COMMUNITY)
Admission: EM | Admit: 2012-09-17 | Discharge: 2012-09-17 | Disposition: A | Discharge status: Home / Self Care | Payer: BC Managed Care – PPO | Attending: Emergency Medicine | Admitting: Emergency Medicine

## 2012-09-17 ENCOUNTER — Emergency Department (HOSPITAL_COMMUNITY): Payer: BC Managed Care – PPO

## 2012-09-17 ENCOUNTER — Ambulatory Visit (INDEPENDENT_AMBULATORY_CARE_PROVIDER_SITE_OTHER): Payer: BC Managed Care – PPO | Admitting: Family Medicine

## 2012-09-17 VITALS — BP 174/90 | HR 90 | Temp 98.5°F | Resp 20 | Ht 65.5 in | Wt 216.0 lb

## 2012-09-17 DIAGNOSIS — K219 Gastro-esophageal reflux disease without esophagitis: Secondary | ICD-10-CM | POA: Insufficient documentation

## 2012-09-17 DIAGNOSIS — Z8589 Personal history of malignant neoplasm of other organs and systems: Secondary | ICD-10-CM | POA: Insufficient documentation

## 2012-09-17 DIAGNOSIS — R5381 Other malaise: Secondary | ICD-10-CM | POA: Insufficient documentation

## 2012-09-17 DIAGNOSIS — Z79899 Other long term (current) drug therapy: Secondary | ICD-10-CM | POA: Insufficient documentation

## 2012-09-17 DIAGNOSIS — R079 Chest pain, unspecified: Secondary | ICD-10-CM

## 2012-09-17 DIAGNOSIS — E669 Obesity, unspecified: Secondary | ICD-10-CM | POA: Insufficient documentation

## 2012-09-17 DIAGNOSIS — R42 Dizziness and giddiness: Secondary | ICD-10-CM | POA: Insufficient documentation

## 2012-09-17 DIAGNOSIS — I10 Essential (primary) hypertension: Secondary | ICD-10-CM | POA: Insufficient documentation

## 2012-09-17 DIAGNOSIS — G4730 Sleep apnea, unspecified: Secondary | ICD-10-CM | POA: Insufficient documentation

## 2012-09-17 DIAGNOSIS — Z87442 Personal history of urinary calculi: Secondary | ICD-10-CM | POA: Insufficient documentation

## 2012-09-17 DIAGNOSIS — R1013 Epigastric pain: Secondary | ICD-10-CM

## 2012-09-17 LAB — POCT I-STAT TROPONIN I: Troponin i, poc: 0.01 ng/mL (ref 0.00–0.08)

## 2012-09-17 LAB — COMPREHENSIVE METABOLIC PANEL
AST: 22 U/L (ref 0–37)
Albumin: 3.8 g/dL (ref 3.5–5.2)
Calcium: 9.3 mg/dL (ref 8.4–10.5)
Chloride: 100 mEq/L (ref 96–112)
Creatinine, Ser: 1.19 mg/dL (ref 0.50–1.35)
Total Bilirubin: 1.2 mg/dL (ref 0.3–1.2)
Total Protein: 6.8 g/dL (ref 6.0–8.3)

## 2012-09-17 LAB — CBC WITH DIFFERENTIAL/PLATELET
Eosinophils Absolute: 0 10*3/uL (ref 0.0–0.7)
Eosinophils Relative: 1 % (ref 0–5)
HCT: 47.6 % (ref 39.0–52.0)
Hemoglobin: 17.5 g/dL — ABNORMAL HIGH (ref 13.0–17.0)
Lymphocytes Relative: 18 % (ref 12–46)
Lymphs Abs: 1.5 10*3/uL (ref 0.7–4.0)
MCH: 32.5 pg (ref 26.0–34.0)
MCV: 88.5 fL (ref 78.0–100.0)
Monocytes Absolute: 0.5 10*3/uL (ref 0.1–1.0)
Monocytes Relative: 6 % (ref 3–12)
RBC: 5.38 MIL/uL (ref 4.22–5.81)
WBC: 8.3 10*3/uL (ref 4.0–10.5)

## 2012-09-17 MED ORDER — NITROGLYCERIN 0.4 MG SL SUBL: 0.4000 mg | SUBLINGUAL_TABLET | Freq: Once | SUBLINGUAL | Status: DC

## 2012-09-17 MED ORDER — GI COCKTAIL ~~LOC~~
30.0000 mL | Freq: Once | ORAL | Status: AC
  Administered 2012-09-17: 30 mL via ORAL

## 2012-09-17 NOTE — ED Notes (Addendum)
Pt to ED via EMS with c/o chest pain at right epigastric, onset 1 week. Pt has Hx GERD and he thought it was a GERD related, indigestion. Pt given 81mg  ASA x4 and 1 nitro by [employer] urgent then EMS gave him 2 nitro. Pain decreased from 4/10 to 1/10. BP-133/69, pulse-64, O2 97% on room air, RR-16 nonlabored. ECG unremarkable for urgent care and EMS.

## 2012-09-17 NOTE — Progress Notes (Signed)
[facility] [address] [place] [address] [phone]  Date:  09/17/2012   Name:  Donald Jones   DOB:  1946/03/12   MRN:  [id number]  PCP:  [name], MD    Chief Complaint: No chief complaint on file.   History of Present Illness:  Donald Jones is a 67 y.o. very pleasant male patient who presents with the following:  He is here today with epigastric CP- he has noted worsening sx that he thinks are due to GERD over the last couple of weeks He exercises regularly on the elliptical machine at the gym and has not noted any problems with CP.  His pain gets worse after he eats, better with burping.  He does not have any known history of CAD, but notes that it does run in his family.   Also, for the last couple of days he has felt lightheaded and been more tired toward the end of the day.  He has been sweating a lot, and has felt nauseated.    No aspirin yet today  Patient Active Problem List   Diagnosis Date Noted  . IBS ? 04/22/2012  . Polycythemia   . Annual physical exam 06/05/2011  . Foot drop, right 06/05/2011  . BACK PAIN 02/15/2009  . OBESITY, NOS 06/14/2006  . HYPERTENSION, BENIGN SYSTEMIC 06/14/2006  . RHINITIS, ALLERGIC 06/14/2006  . NEPHROLITHIASIS 06/14/2006  . OSA (obstructive sleep apnea) 06/14/2006    Past Medical History  Diagnosis Date  . Sleep apnea   . Hypertension   . Obesity     100-lb weight loss since 2011.   . Kidney stones     several episodes.  Last one in 2011.    Marland Kitchen GERD (gastroesophageal reflux disease)   . Polycythemia     Past Surgical History  Procedure Laterality Date  . Cholecystectomy    . Hand surgery  2010    R hand , d/t a injury, 3 surgeries   . Colonoscopy  06-09-02    Dr.Santogade    History  Substance Use Topics  . Smoking status: Never Smoker   . Smokeless tobacco: Never Used  . Alcohol Use: No     Comment: none for 15 years    Family History  Problem Relation Age of Onset  . Diabetes  Mother   . Stroke      M and F  . Coronary artery disease      M?  . Colon cancer Sister 44    age ~ 90  . Prostate cancer Neg Hx   . Stroke Father   . Heart disease Sister     ?  Marland Kitchen Alcohol abuse Sister     Allergies  Allergen Reactions  . Sulfa Antibiotics Nausea And Vomiting    Medication list has been reviewed and updated.  Current Outpatient Prescriptions on File Prior to Visit  Medication Sig Dispense Refill  . acetaminophen (TYLENOL) 325 MG tablet Take 650 mg by mouth every 6 (six) hours as needed.      . Cyanocobalamin (B-12) 500 MCG TABS Take 1 tablet by mouth daily.      Marland Kitchen dicyclomine (BENTYL) 20 MG tablet Take 1 tablet (20 mg total) by mouth 3 (three) times daily as needed.  60 tablet  1  . hydrochlorothiazide (HYDRODIURIL) 25 MG tablet TAKE 1 TABLET BY MOUTH EVERY MORNING  90 tablet  3  . Multiple Vitamin (MULTIVITAMIN) tablet Take 1 tablet by mouth daily.  No current facility-administered medications on file prior to visit.    Review of Systems:  As per HPI- otherwise negative.   Physical Examination: There were no vitals filed for this visit. There were no vitals filed for this visit. There is no weight on file to calculate BMI. Ideal Body Weight:    GEN: WDWN, NAD, Non-toxic, A & O x 3, overweight HEENT: Atraumatic, Normocephalic. Neck supple. No masses, No LAD. Ears and Nose: No external deformity. CV: RRR, No M/G/R. No JVD. No thrill. No extra heart sounds. PULM: CTA B, no wheezes, crackles, rhonchi. No retractions. No resp. distress. No accessory muscle use. ABD: S, NT, ND, +BS. No rebound. No HSM. EXTR: No c/c/e NEURO Normal gait.  PSYCH: Normally interactive. Conversant. Not depressed or anxious appearing.  Calm demeanor.   EKG: normal Given a GI cocktail.  This helped some, but he continued to note chest discomfort and sweating.   At that point called EMS, applied O2 2L via [place], and gave aspirin 81 mg X4 po.   He reports he does not  use any medication such as viagra, cialis or levitra  Given nitroglycerin 0.4 mg once at 12:17 pm.  Recheck BP: 120/85.  He did note relief of his pain after nitro use Assessment and Plan: Chest pain, unspecified - Plan: EKG 12-Lead, gi cocktail (Maalox,Lidocaine,Donnatal)  Keighan is here with chest pain today.  May indicate unstable angina, although GERD may also be to blame.  He was brought back urgently due to "sweating buckets" in the lobby per front desk staff, and is noted to be sweating in clinic.  Will refer to ED via EMS today for further evaluation and CP rule- out. Also consider cholecystitis.  Of note he did feel better after nitroglycerin in clinic.     Signed Abbe Amsterdam, MD

## 2012-09-17 NOTE — ED Provider Notes (Signed)
History     CSN: [id number]  Arrival date & time 09/17/12  1258   First MD Initiated Contact with Patient 09/17/12 1310      Chief Complaint  Patient presents with  . Chest Pain     Patient is a 67 y.o. male presenting with abdominal pain. The history is provided by the patient.  Abdominal Pain This is a recurrent problem. The current episode started more than 1 week ago. The problem occurs daily. The problem has been gradually worsening. Associated symptoms include abdominal pain. Pertinent negatives include no shortness of breath. The symptoms are aggravated by eating. The symptoms are relieved by rest.  pt reports he has had epigastric pain for over 2 weeks.  He reports everytime he eats (usually with ketchup) he has burning and pain.  No sob.  No syncope This morning, the pain became severe in his epigastric region and he then developed slight dizziness He now feels improved He does report he was diaphoretic this morning with the pain but this has resolved   He has no h/o CAD/DVT/PE   He is very active and uses elliptical machine and has not had any CP/weakness or limitations in the past several weeks  Past Medical History  Diagnosis Date  . Sleep apnea   . Hypertension   . Obesity     100-lb weight loss since 2011.   . Kidney stones     several episodes.  Last one in 2011.    Marland Kitchen GERD (gastroesophageal reflux disease)   . Polycythemia     Past Surgical History  Procedure Laterality Date  . Cholecystectomy    . Hand surgery  2010    R hand , d/t a injury, 3 surgeries   . Colonoscopy  06-09-02    Dr.Santogade    Family History  Problem Relation Age of Onset  . Diabetes Mother   . Stroke      M and F  . Coronary artery disease      M?  . Colon cancer Sister 33    age ~ 29  . Prostate cancer Neg Hx   . Stroke Father   . Heart disease Sister     ?  Marland Kitchen Alcohol abuse Sister     History  Substance Use Topics  . Smoking status: Never Smoker   . Smokeless  tobacco: Never Used  . Alcohol Use: No     Comment: none for 15 years      Review of Systems  Constitutional: Positive for fatigue. Negative for fever.  Respiratory: Negative for shortness of breath.   Gastrointestinal: Positive for abdominal pain. Negative for vomiting and blood in stool.  Neurological: Positive for dizziness.  All other systems reviewed and are negative.    Allergies  Sulfa antibiotics  Home Medications   Current Outpatient Rx  Name  Route  Sig  Dispense  Refill  . acetaminophen (TYLENOL) 325 MG tablet   Oral   Take 650 mg by mouth every 6 (six) hours as needed.         . Cyanocobalamin (B-12) 500 MCG TABS   Oral   Take 1 tablet by mouth daily.         Marland Kitchen dicyclomine (BENTYL) 20 MG tablet   Oral   Take 1 tablet (20 mg total) by mouth 3 (three) times daily as needed.   60 tablet   1   . hydrochlorothiazide (HYDRODIURIL) 25 MG tablet      TAKE 1  TABLET BY MOUTH EVERY MORNING   90 tablet   3   . Multiple Vitamin (MULTIVITAMIN) tablet   Oral   Take 1 tablet by mouth daily.           Marland Kitchen omeprazole (PRILOSEC) 20 MG capsule   Oral   Take 20 mg by mouth daily.           BP 127/69  Pulse 58  Temp(Src) 98.6 F (37 C) (Oral)  Resp 12  SpO2 95%  Physical Exam CONSTITUTIONAL: Well developed/well nourished HEAD: Normocephalic/atraumatic EYES: EOMI/PERRL ENMT: Mucous membranes moist NECK: supple no meningeal signs SPINE:entire spine nontender CV: S1/S2 noted, no murmurs/rubs/gallops noted LUNGS: Lungs are clear to auscultation bilaterally, no apparent distress ABDOMEN: soft, nontender, no rebound or guarding GU:no cva tenderness NEURO: Pt is awake/alert, moves all extremitiesx4 EXTREMITIES: pulses normal, full ROM SKIN: warm, color normal PSYCH: no abnormalities of mood noted  ED Course  Procedures  Labs Reviewed  COMPREHENSIVE METABOLIC PANEL  CBC WITH DIFFERENTIAL  LIPASE, BLOOD  1:58 PM Pt here from urgent care for  evaluation He has no active CP His last episode of epigastric pain/sweating was around 10am It seems related to food and he reports he is very active My suspicion for ACS/PE/Dissection is low His HEART score is 3 currently He has already been given ASA He has no current pain 4:27 PM Pt stable, no new complaints Repeat EKG unchanged 4:48 PM Second troponin negative Referred to cardiology Pt stable in the ED   MDM  Nursing notes including past medical history and social history reviewed and considered in documentation xrays reviewed and considered Labs/vital reviewed and considered Previous records reviewed and considered - urgent care notes reviewed      Date: 09/17/2012  1306  Rate: 55  Rhythm: normal sinus rhythm  QRS Axis: normal  Intervals: normal  ST/T Wave abnormalities: nonspecific ST changes  Conduction Disutrbances:none  Narrative Interpretation:   Old EKG Reviewed: unchanged     Date: 09/17/2012 1607  Rate: 49  Rhythm: sinus bradycardia  QRS Axis: normal  Intervals: normal  ST/T Wave abnormalities: nonspecific ST changes  Conduction Disutrbances:none  Narrative Interpretation:   Old EKG Reviewed: unchanged from earlier    Joya Gaskins, MD 09/17/12 1648

## 2012-09-19 ENCOUNTER — Other Ambulatory Visit: Payer: Self-pay | Admitting: Internal Medicine

## 2012-09-19 NOTE — Telephone Encounter (Signed)
Refill done.  

## 2012-09-24 ENCOUNTER — Ambulatory Visit (INDEPENDENT_AMBULATORY_CARE_PROVIDER_SITE_OTHER): Payer: BC Managed Care – PPO | Admitting: Internal Medicine

## 2012-09-24 ENCOUNTER — Encounter: Payer: Self-pay | Admitting: Internal Medicine

## 2012-09-24 VITALS — BP 128/84 | HR 55 | Temp 98.5°F | Wt 216.0 lb

## 2012-09-24 DIAGNOSIS — I10 Essential (primary) hypertension: Secondary | ICD-10-CM

## 2012-09-24 DIAGNOSIS — L989 Disorder of the skin and subcutaneous tissue, unspecified: Secondary | ICD-10-CM | POA: Insufficient documentation

## 2012-09-24 DIAGNOSIS — K219 Gastro-esophageal reflux disease without esophagitis: Secondary | ICD-10-CM

## 2012-09-24 MED ORDER — OMEPRAZOLE 40 MG PO CPDR: 40.0000 mg | DELAYED_RELEASE_CAPSULE | Freq: Every day | ORAL | Status: DC

## 2012-09-24 NOTE — Assessment & Plan Note (Addendum)
Recent symptoms as described in the history of present illness are likely GERD related. Plan: Continue with PPIs, a new prescription for omeprazole was provided. Patient will call if symptoms increase or resurface. As long as he respond to PPIs, I don't see an indication for a EGD.

## 2012-09-24 NOTE — Assessment & Plan Note (Signed)
Well-controlled, recent BMP normal, no change 

## 2012-09-24 NOTE — Progress Notes (Signed)
  Subjective:    Patient ID: Donald Jones, male    DOB: [birth year], 67 y.o.   MRN: [id number]  HPI ER followup Seen in the ER 09/17/2012 with  Epigastric and R sided CP. Pain is described as burning, increase after eating, decrease after burping. He also reports that he self discontinue Prilosec 2 months prior to the symptoms because he was feeling very good. He thought it could be his heart because he felt sweaty and lightheaded from time to time however he is able to do Exercise vigorously without chest pain or shortness or breath. At the ER he had the following tests:  EKG normal sinus rhythm, chest x-ray essentially negative. CMP normal, CBC normal (hemoglobin 17.5 which is high but slightly lower than  Baseline). Troponin negative twice.  He take his BP medications regularly, normal ambulatory BPs. Has 2 skin lesions that he is concerned about. They have been there for 5 years and not changing much  Past Medical History  Diagnosis Date  . Sleep apnea     on Cpap  . Hypertension   . Obesity     100-lb weight loss since 2011.   . Kidney stones     several episodes.  Last one in 2011.    Marland Kitchen GERD (gastroesophageal reflux disease)   . Polycythemia    Past Surgical History  Procedure Laterality Date  . Cholecystectomy    . Hand surgery  2010    R hand , d/t a injury, 3 surgeries   . Colonoscopy  06-09-02    Dr.Santogade   History   Social History  . Marital Status: Widowed    Spouse Name: N/A    Number of Children: 3   . 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 Smoking status: Never Smoker   . Smokeless tobacco: Never Used  . Alcohol Use: No     Comment: none for 15 years  . Drug Use: No  . Sexually Active: No   Other Topics Concern  . Not on file   Social History Narrative   1ST WIFE DIED FROM LUNG CANCER, 2ND WIFE DIED FROM RENAL CANCER. Engaged     Review of Systems Since the ER visit, she restarted  OTC Prilosec 20 mg tablet daily and  feels better. Symptoms essentially resolved. Denies nausea, vomiting, change in the color of the stools. No recent fever, chills, cold fever No dysphagia or odynophagia.     Objective:   Physical Exam  HENT:  Head:     ,BP 128/84  Pulse 55  Temp(Src) 98.5 F (36.9 C) (Oral)  Wt 216 lb (97.977 kg)  BMI 35.38 kg/m2  SpO2 96%  General -- alert, well-developed, nad .    Lungs -- normal respiratory effort, no intercostal retractions, no accessory muscle use, and normal breath sounds.   Heart-- normal rate, regular rhythm, no murmur, and no gallop.   Abdomen--soft, non-tender, no distention, no masses, no HSM, no guarding, and no rigidity.   Extremities-- no pretibial edema bilaterally  Neurologic-- alert & oriented X3 and strength normal in all extremities. Psych-- Cognition and judgment appear intact. Alert and cooperative with normal attention span and concentration.  not anxious appearing and not depressed appearing.        Assessment & Plan:

## 2012-09-24 NOTE — Patient Instructions (Addendum)
Next visit with me should be by January 2015 (Physical exam, fasting ), please cancel the appointment for next month and schedule a new one.

## 2012-09-24 NOTE — Assessment & Plan Note (Signed)
Skin lesions, the one at  the left ear could be a  SCC. Patient is concerned, refer to dermatology

## 2012-09-25 ENCOUNTER — Encounter: Payer: Self-pay | Admitting: Internal Medicine

## 2012-10-21 ENCOUNTER — Ambulatory Visit: Payer: BC Managed Care – PPO | Admitting: Internal Medicine

## 2012-11-18 ENCOUNTER — Ambulatory Visit: Payer: BC Managed Care – PPO | Admitting: Pulmonary Disease

## 2012-11-25 ENCOUNTER — Ambulatory Visit: Payer: BC Managed Care – PPO | Admitting: Pulmonary Disease

## 2012-11-28 ENCOUNTER — Encounter: Payer: Self-pay | Admitting: Pulmonary Disease

## 2012-11-28 ENCOUNTER — Ambulatory Visit (INDEPENDENT_AMBULATORY_CARE_PROVIDER_SITE_OTHER): Payer: BC Managed Care – PPO | Admitting: Pulmonary Disease

## 2012-11-28 VITALS — BP 150/86 | HR 55 | Temp 98.3°F | Ht 65.0 in | Wt 222.4 lb

## 2012-11-28 DIAGNOSIS — G4733 Obstructive sleep apnea (adult) (pediatric): Secondary | ICD-10-CM

## 2012-11-28 NOTE — Patient Instructions (Addendum)
Continue with cpap, and try to wear at least every 4-6 hrs each night Keep up with mask cushion changes and supplies.  Work on weight loss followup with me in one year, but call if having issues with your machine.

## 2012-11-28 NOTE — Progress Notes (Signed)
  Subjective:    Patient ID: Donald Jones, male    DOB: 03/22/1946, 67 y.o.   MRN: [id number]  HPI Patient comes in today for followup of his obstructive sleep apnea.  He is wearing his CPAP on the automatic setting, and his download shows no significant mask leak and excellent control of his AHI.  His compliance is not quite an acceptable level, and I have encouraged him to work on this.  He feels that he is sleeping well with the device, and reports no issues with mask or pressure.   Review of Systems  Constitutional: Negative for fever and unexpected weight change.  HENT: Negative for ear pain, nosebleeds, congestion, sore throat, rhinorrhea, sneezing, trouble swallowing, dental problem, postnasal drip and sinus pressure.   Eyes: Negative for redness and itching.  Respiratory: Negative for cough, chest tightness, shortness of breath and wheezing.   Cardiovascular: Negative for palpitations and leg swelling.  Gastrointestinal: Negative for nausea and vomiting.  Genitourinary: Negative for dysuria.  Musculoskeletal: Negative for joint swelling.  Skin: Negative for rash.  Neurological: Negative for headaches.  Hematological: Does not bruise/bleed easily.  Psychiatric/Behavioral: Negative for dysphoric mood. The patient is not nervous/anxious.        Objective:   Physical Exam Overweight male in no acute distress Nose without purulence or discharge noted Neck without lymphadenopathy or thyromegaly No skin breakdown or pressure necrosis from the CPAP mask Lower extremities with minimal edema, no cyanosis Alert and oriented, does not appear to be sleepy, moves all 4 extremities       Assessment & Plan:

## 2012-11-28 NOTE — Assessment & Plan Note (Signed)
The patient feels that he is doing well with CPAP, and that it has helped his sleep and daytime alertness.  His download shows excellent control of his AHI, but I would like to see him use for more hours each night.  The patient states that he will work on this.  I have also encouraged him to work on weight loss.

## 2012-12-03 ENCOUNTER — Encounter: Payer: Self-pay | Admitting: Family Medicine

## 2012-12-03 ENCOUNTER — Ambulatory Visit (INDEPENDENT_AMBULATORY_CARE_PROVIDER_SITE_OTHER): Payer: BC Managed Care – PPO | Admitting: Family Medicine

## 2012-12-03 VITALS — BP 122/82 | HR 87 | Temp 98.9°F | Wt 221.4 lb

## 2012-12-03 DIAGNOSIS — E669 Obesity, unspecified: Secondary | ICD-10-CM | POA: Insufficient documentation

## 2012-12-03 DIAGNOSIS — Z23 Encounter for immunization: Secondary | ICD-10-CM

## 2012-12-03 DIAGNOSIS — L03115 Cellulitis of right lower limb: Secondary | ICD-10-CM

## 2012-12-03 DIAGNOSIS — L02419 Cutaneous abscess of limb, unspecified: Secondary | ICD-10-CM

## 2012-12-03 MED ORDER — CEPHALEXIN 500 MG PO CAPS: 500.0000 mg | ORAL_CAPSULE | Freq: Two times a day (BID) | ORAL | Status: DC

## 2012-12-03 NOTE — Patient Instructions (Addendum)
Cellulitis Cellulitis is an infection of the skin and the tissue beneath it. The infected area is usually red and tender. Cellulitis occurs most often in the arms and lower legs.  CAUSES  Cellulitis is caused by bacteria that enter the skin through cracks or cuts in the skin. The most common types of bacteria that cause cellulitis are Staphylococcus and Streptococcus. SYMPTOMS   Redness and warmth.  Swelling.  Tenderness or pain.  Fever. DIAGNOSIS  Your caregiver can usually determine what is wrong based on a physical exam. Blood tests may also be done. TREATMENT  Treatment usually involves taking an antibiotic medicine. HOME CARE INSTRUCTIONS   Take your antibiotics as directed. Finish them even if you start to feel better.  Keep the infected arm or leg elevated to reduce swelling.  Apply a warm cloth to the affected area up to 4 times per day to relieve pain.  Only take over-the-counter or prescription medicines for pain, discomfort, or fever as directed by your caregiver.  Keep all follow-up appointments as directed by your caregiver. SEEK MEDICAL CARE IF:   You notice red streaks coming from the infected area.  Your red area gets larger or turns dark in color.  Your bone or joint underneath the infected area becomes painful after the skin has healed.  Your infection returns in the same area or another area.  You notice a swollen bump in the infected area.  You develop new symptoms. SEEK IMMEDIATE MEDICAL CARE IF:   You have a fever.  You feel very sleepy.  You develop vomiting or diarrhea.  You have a general ill feeling (malaise) with muscle aches and pains. MAKE SURE YOU:   Understand these instructions.  Will watch your condition.  Will get help right away if you are not doing well or get worse. Document Released: 01/11/2005 Document Revised: 10/03/2011 Document Reviewed: 06/19/2011 ExitCare Patient Information 2014 [employer].  

## 2012-12-04 ENCOUNTER — Encounter: Payer: Self-pay | Admitting: Family Medicine

## 2012-12-04 DIAGNOSIS — L03115 Cellulitis of right lower limb: Secondary | ICD-10-CM | POA: Insufficient documentation

## 2012-12-04 NOTE — Assessment & Plan Note (Signed)
abx Alt cold and warm compresses rto prn

## 2012-12-04 NOTE — Progress Notes (Signed)
  Subjective:    Patient ID: Donald Jones, male    DOB: 03/05/1946, 67 y.o.   MRN: [id number]  HPI Pt here c/o large knot on R shin after falling on rocks about 3 weeks ago.  It is tender, hot to touch.  No fevers.  Review of Systems As above    Objective:   Physical Exam BP 122/82  Pulse 87  Temp(Src) 98.9 F (37.2 C) (Oral)  Wt 221 lb 6.4 oz (100.426 kg)  BMI 36.84 kg/m2  SpO2 97% General appearance: alert, cooperative, appears stated age and no distress Skin: + lump with surrounding errythema about 2-3 in diameter , nontender        Assessment & Plan:

## 2013-01-30 ENCOUNTER — Other Ambulatory Visit: Payer: Self-pay | Admitting: Oncology

## 2013-02-03 ENCOUNTER — Telehealth: Payer: Self-pay | Admitting: *Deleted

## 2013-02-03 NOTE — Telephone Encounter (Signed)
i will mail a letter/avs making the pt aware of his appts that was moved from 11/7 to 02/24/13...td

## 2013-02-05 ENCOUNTER — Ambulatory Visit (INDEPENDENT_AMBULATORY_CARE_PROVIDER_SITE_OTHER): Payer: BC Managed Care – PPO

## 2013-02-05 DIAGNOSIS — Z23 Encounter for immunization: Secondary | ICD-10-CM

## 2013-02-21 ENCOUNTER — Other Ambulatory Visit: Payer: BC Managed Care – PPO | Admitting: Lab

## 2013-02-21 ENCOUNTER — Other Ambulatory Visit: Payer: Self-pay | Admitting: Hematology and Oncology

## 2013-02-21 ENCOUNTER — Ambulatory Visit: Payer: BC Managed Care – PPO | Admitting: Oncology

## 2013-02-21 DIAGNOSIS — D751 Secondary polycythemia: Secondary | ICD-10-CM

## 2013-02-24 ENCOUNTER — Other Ambulatory Visit (HOSPITAL_BASED_OUTPATIENT_CLINIC_OR_DEPARTMENT_OTHER): Payer: BC Managed Care – PPO | Admitting: Lab

## 2013-02-24 ENCOUNTER — Ambulatory Visit (HOSPITAL_BASED_OUTPATIENT_CLINIC_OR_DEPARTMENT_OTHER): Payer: BC Managed Care – PPO | Admitting: Hematology and Oncology

## 2013-02-24 ENCOUNTER — Telehealth: Payer: Self-pay | Admitting: Hematology and Oncology

## 2013-02-24 VITALS — BP 138/78 | HR 57 | Temp 98.2°F | Resp 18 | Ht 65.0 in | Wt 225.1 lb

## 2013-02-24 DIAGNOSIS — D751 Secondary polycythemia: Secondary | ICD-10-CM

## 2013-02-24 DIAGNOSIS — G4733 Obstructive sleep apnea (adult) (pediatric): Secondary | ICD-10-CM

## 2013-02-24 LAB — CBC & DIFF AND RETIC
BASO%: 0.4 % (ref 0.0–2.0)
Basophils Absolute: 0 10*3/uL (ref 0.0–0.1)
EOS%: 1.9 % (ref 0.0–7.0)
HCT: 50 % — ABNORMAL HIGH (ref 38.4–49.9)
HGB: 18 g/dL — ABNORMAL HIGH (ref 13.0–17.1)
Immature Retic Fract: 3.8 % (ref 3.00–10.60)
MCH: 32 pg (ref 27.2–33.4)
MONO#: 0.5 10*3/uL (ref 0.1–0.9)
NEUT%: 54.8 % (ref 39.0–75.0)
RDW: 12.8 % (ref 11.0–14.6)
Retic %: 1.64 % (ref 0.80–1.80)
Retic Ct Abs: 92.17 10*3/uL (ref 34.80–93.90)
WBC: 8.4 10*3/uL (ref 4.0–10.3)
lymph#: 3.1 10*3/uL (ref 0.9–3.3)

## 2013-02-24 NOTE — Telephone Encounter (Signed)
appts made per 11/10 POF MW and Seward Grater both gone for day Email to MW to add 1 unit of blood for tomorrow after 3 pm..Marland KitchenCall pt when we know what time shh

## 2013-02-24 NOTE — Progress Notes (Signed)
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 He had background history of obstructive sleep apnea and stated that his been using CPAP machine for approximately 7 hours a day. He had phlebotomy 1 unit year ago INTERVAL HISTORY: Donald Jones 67 y.o. male returns for further workup. He denies any signs and symptoms of severe polycythemia. Denies any headaches, leg cramps, shortness breath or chest pain. He stated his been compliant using his CPAP machine. He denies any recent smoking. Denies any testosterone use.  I have reviewed the past medical history, past surgical history, social history and family history with the patient and they are unchanged from previous note.  ALLERGIES:  is allergic to sulfa antibiotics.  MEDICATIONS:  Current Outpatient Prescriptions  Medication Sig Dispense Refill  . acetaminophen (TYLENOL) 325 MG tablet Take 650 mg by mouth every 6 (six) hours as needed for pain.       . cephALEXin (KEFLEX) 500 MG capsule Take 1 capsule (500 mg total) by mouth 2 (two) times daily.  20 capsule  0  . Cyanocobalamin (B-12) 500 MCG TABS Take 1 tablet by mouth daily.      . Homeopathic Products (SINUS BUSTER NA) Place 1 puff into the nose daily as needed.      . hydrochlorothiazide (HYDRODIURIL) 25 MG tablet TAKE 1 TABLET BY MOUTH EVERY MORNING  90 tablet  1  . Multiple Vitamin (MULTIVITAMIN) tablet Take 1 tablet by mouth daily.       Marland Kitchen omeprazole (PRILOSEC) 40 MG capsule Take 1 capsule (40 mg total) by mouth daily.  90 capsule  3  . Tolnaftate (ANTI-FUNGAL EX) Apply 1 application topically daily as needed (for foot fungus.).      Marland Kitchen dicyclomine (BENTYL) 20 MG tablet Take 20 mg by mouth 3 (three) times daily as needed.       Current Facility-Administered Medications  Medication  Dose Route Frequency Provider Last Rate Last Dose  . nitroGLYCERIN (NITROSTAT) SL tablet 0.4 mg  0.4 mg Sublingual Once Gwenlyn Found Copland, MD         REVIEW OF SYSTEMS:   Constitutional: Denies fevers, chills or night sweats Eyes: Denies blurriness of vision Ears, nose, mouth, throat, and face: Denies mucositis or sore throat Respiratory: Denies cough, dyspnea or wheezes Cardiovascular: Denies palpitation, chest discomfort or lower extremity swelling Gastrointestinal:  Denies nausea, heartburn or change in bowel habits Skin: Denies abnormal skin rashes Lymphatics: Denies new lymphadenopathy or easy bruising Neurological:Denies numbness, tingling or new weaknesses Behavioral/Psych: Mood is stable, no new changes  All other systems were reviewed with the patient and are negative.  PHYSICAL EXAMINATION: ECOG PERFORMANCE STATUS: 0 - Asymptomatic  Filed Vitals:   02/24/13 1507  BP: 138/78  Pulse: 57  Temp: 98.2 F (36.8 C)  Resp: 18   Filed Weights   02/24/13 1507  Weight: 225 lb 1.6 oz (102.105 kg)    GENERAL:alert, no distress and comfortable. Patient is morbidly obese SKIN: skin color, texture, turgor are normal, no rashes or significant lesions there is a plethoric complexion EYES: normal, Conjunctiva are pink and non-injected, sclera clear OROPHARYNX:no exudate, no erythema and lips, buccal mucosa, and tongue normal  NECK: supple, thyroid normal size, non-tender, without nodularity LYMPH:  no palpable lymphadenopathy in the cervical, axillary or inguinal LUNGS: clear to auscultation and percussion with normal breathing effort  HEART: regular rate & rhythm and no murmurs and no lower extremity edema ABDOMEN:abdomen soft, non-tender and normal bowel sounds Musculoskeletal:no cyanosis of digits and no clubbing  NEURO: alert & oriented x 3 with fluent speech, no focal motor/sensory deficits  LABORATORY DATA:  I have reviewed the data as listed Results for orders placed in  visit on 02/24/13 (from the past 48 hour(s))  CBC & DIFF AND RETIC     Status: Abnormal   Collection Time    02/24/13  2:26 PM      Result Value Range   WBC 8.4  4.0 - 10.3 10e3/uL   NEUT# 4.6  1.5 - 6.5 10e3/uL   HGB 18.0 (*) 13.0 - 17.1 g/dL   HCT 81.1 (*) 91.4 - 78.2 %   Platelets 222  140 - 400 10e3/uL   MCV 89.0  79.3 - 98.0 fL   MCH 32.0  27.2 - 33.4 pg   MCHC 36.0  32.0 - 36.0 g/dL   RBC 9.56  2.13 - 0.86 10e6/uL   RDW 12.8  11.0 - 14.6 %   lymph# 3.1  0.9 - 3.3 10e3/uL   MONO# 0.5  0.1 - 0.9 10e3/uL   Eosinophils Absolute 0.2  0.0 - 0.5 10e3/uL   Basophils Absolute 0.0  0.0 - 0.1 10e3/uL   NEUT% 54.8  39.0 - 75.0 %   LYMPH% 36.7  14.0 - 49.0 %   MONO% 6.2  0.0 - 14.0 %   EOS% 1.9  0.0 - 7.0 %   BASO% 0.4  0.0 - 2.0 %   nRBC 0  0 - 0 %   Retic % 1.64  0.80 - 1.80 %   Retic Ct Abs 92.17  34.80 - 93.90 10e3/uL   Immature Retic Fract 3.80  3.00 - 10.60 %      ASSESSMENT:  Significant polycythemia  PLAN:  #1 secondary erythrocytosis He has a JAK 2 mutation analysis performed last year it came back negative. I have recheck erythropoietin level and is pending. I am also checking his ferritin level. I reviewed his most recent CT imaging myself which showed no evidence of splenomegaly I recommend removing one unit of blood as soon as possible. The patient agrees. In the meantime I have asked him to start aspirin to prevent risk of blood clot. I will see him back next week to review further test results. Depending on his ferritin level, we might have to phlebotomize him periodically in the future to keep his ferritin level down and prevent severe erythrocytosis. I do not think he would need a bone marrow aspirate and biopsy to rule out myeloproliferative disorder as I believe the yield is very low. #2 obstructive sleep apnea This is the most likely cause of his secondary erythrocytosis. The patient states that he's been compliant. Patient probably need to lose weight.  The  risk and benefits of phlebotomy is discussed with the patient and he agreed to proceed. All questions were answered. The patient knows to call the clinic with any problems, questions or concerns. No barriers to learning was detected.    Atiana Levier, MD 02/24/2013 3:30 PM

## 2013-02-25 ENCOUNTER — Ambulatory Visit (HOSPITAL_BASED_OUTPATIENT_CLINIC_OR_DEPARTMENT_OTHER): Payer: BC Managed Care – PPO

## 2013-02-25 ENCOUNTER — Telehealth: Payer: Self-pay | Admitting: Hematology and Oncology

## 2013-02-25 ENCOUNTER — Other Ambulatory Visit: Payer: Self-pay | Admitting: Hematology and Oncology

## 2013-02-25 VITALS — BP 130/85 | HR 58 | Temp 98.4°F | Resp 18

## 2013-02-25 DIAGNOSIS — D751 Secondary polycythemia: Secondary | ICD-10-CM

## 2013-02-25 DIAGNOSIS — D45 Polycythemia vera: Secondary | ICD-10-CM

## 2013-02-25 LAB — ERYTHROPOIETIN: Erythropoietin: 12.1 m[IU]/mL (ref 2.6–18.5)

## 2013-02-25 LAB — SEDIMENTATION RATE: Sed Rate: 1 mm/hr (ref 0–16)

## 2013-02-25 NOTE — Patient Instructions (Signed)

## 2013-02-25 NOTE — Progress Notes (Signed)
Therapeutic phlebotomy performed per MD order. 1 unit of blood removed. Patient tolerated well without any complications. Patient observed 30 mins afterwards and VS taken at that time. VS stable. Patient discharged home. Angelena Form, RN

## 2013-02-25 NOTE — Telephone Encounter (Signed)
sw pt adv to be here today at 3pm for phlebotomy and also adv if Dr Reece Agar replies to my email about whether or not he needs to be type and crossed and needs to arrive any earlier I will call him back as soon as I know..If pt does not hear from me will arrive at 3pm shh

## 2013-03-03 ENCOUNTER — Ambulatory Visit (HOSPITAL_BASED_OUTPATIENT_CLINIC_OR_DEPARTMENT_OTHER): Payer: BC Managed Care – PPO | Admitting: Hematology and Oncology

## 2013-03-03 ENCOUNTER — Other Ambulatory Visit (HOSPITAL_BASED_OUTPATIENT_CLINIC_OR_DEPARTMENT_OTHER): Payer: BC Managed Care – PPO | Admitting: Lab

## 2013-03-03 ENCOUNTER — Telehealth: Payer: Self-pay | Admitting: Hematology and Oncology

## 2013-03-03 VITALS — BP 145/80 | HR 58 | Temp 98.3°F | Resp 19 | Ht 65.0 in | Wt 226.8 lb

## 2013-03-03 DIAGNOSIS — G4733 Obstructive sleep apnea (adult) (pediatric): Secondary | ICD-10-CM

## 2013-03-03 DIAGNOSIS — D751 Secondary polycythemia: Secondary | ICD-10-CM

## 2013-03-03 LAB — CBC WITH DIFFERENTIAL/PLATELET
BASO%: 0.5 % (ref 0.0–2.0)
Basophils Absolute: 0.1 10*3/uL (ref 0.0–0.1)
EOS%: 2.9 % (ref 0.0–7.0)
HCT: 46.9 % (ref 38.4–49.9)
HGB: 16 g/dL (ref 13.0–17.1)
LYMPH%: 33.5 % (ref 14.0–49.0)
MCH: 31.6 pg (ref 27.2–33.4)
MCHC: 34.1 g/dL (ref 32.0–36.0)
MCV: 92.8 fL (ref 79.3–98.0)
MONO%: 7.3 % (ref 0.0–14.0)
NEUT%: 55.8 % (ref 39.0–75.0)

## 2013-03-03 NOTE — Progress Notes (Signed)
[facility] OFFICE PROGRESS NOTE  [name], MD DIAGNOSIS:  Secondary erythrocytosis from obstructive sleep apnea  SUMMARY OF HEMATOLOGIC HISTORY: This is a pleasant 53 year gentleman who is being referred here because of erythrocytosis. On 02/24/2013 we remove one unit of blood INTERVAL HISTORY: Donald Jones 67 y.o. male returns for further followup. He has very mild dizziness after phlebotomy but the next day he felt better with great improvement of energy level  I have reviewed the past medical history, past surgical history, social history and family history with the patient and they are unchanged from previous note.  ALLERGIES:  is allergic to sulfa antibiotics.  MEDICATIONS:  Current Outpatient Prescriptions  Medication Sig Dispense Refill  . acetaminophen (TYLENOL) 325 MG tablet Take 650 mg by mouth every 6 (six) hours as needed for pain.       . Cyanocobalamin (B-12) 500 MCG TABS Take 1 tablet by mouth daily.      Marland Kitchen dicyclomine (BENTYL) 20 MG tablet Take 20 mg by mouth 3 (three) times daily as needed.      . Homeopathic Products (SINUS BUSTER NA) Place 1 puff into the nose daily as needed.      . hydrochlorothiazide (HYDRODIURIL) 25 MG tablet TAKE 1 TABLET BY MOUTH EVERY MORNING  90 tablet  1  . Multiple Vitamin (MULTIVITAMIN) tablet Take 1 tablet by mouth daily.       Marland Kitchen omeprazole (PRILOSEC) 40 MG capsule Take 1 capsule (40 mg total) by mouth daily.  90 capsule  3  . Tolnaftate (ANTI-FUNGAL EX) Apply 1 application topically daily as needed (for foot fungus.).       Current Facility-Administered Medications  Medication Dose Route Frequency Provider Last Rate Last Dose  . nitroGLYCERIN (NITROSTAT) SL tablet 0.4 mg  0.4 mg Sublingual Once Gwenlyn Found Copland, MD         REVIEW OF SYSTEMS:   Constitutional: Denies fevers, chills or night sweats Eyes: Denies blurriness of vision Ears, nose, mouth, throat, and face: Denies mucositis or sore throat Respiratory:  Denies cough, dyspnea or wheezes Cardiovascular: Denies palpitation, chest discomfort or lower extremity swelling Gastrointestinal:  Denies nausea, heartburn or change in bowel habits Skin: Denies abnormal skin rashes Lymphatics: Denies new lymphadenopathy or easy bruising Neurological:Denies numbness, tingling or new weaknesses Behavioral/Psych: Mood is stable, no new changes  All other systems were reviewed with the patient and are negative.  PHYSICAL EXAMINATION: ECOG PERFORMANCE STATUS: 0 - Asymptomatic  Filed Vitals:   03/03/13 1531  BP: 145/80  Pulse: 58  Temp: 98.3 F (36.8 C)  Resp: 19   Filed Weights   03/03/13 1531  Weight: 226 lb 12.8 oz (102.876 kg)    GENERAL:alert, no distress and comfortable. The patient will plethoric  & morbidly obese NEURO: alert & oriented x 3 with fluent speech, no focal motor/sensory deficits  LABORATORY DATA:  I have reviewed the data as listed Results for orders placed in visit on 03/03/13 (from the past 48 hour(s))  CBC WITH DIFFERENTIAL     Status: None   Collection Time    03/03/13  3:07 PM      Result Value Range   WBC 9.7  4.0 - 10.3 10e3/uL   NEUT# 5.4  1.5 - 6.5 10e3/uL   HGB 16.0  13.0 - 17.1 g/dL   HCT 16.1  09.6 - 04.5 %   Platelets 243  140 - 400 10e3/uL   MCV 92.8  79.3 - 98.0 fL   MCH  31.6  27.2 - 33.4 pg   MCHC 34.1  32.0 - 36.0 g/dL   RBC 1.61  0.96 - 0.45 10e6/uL   RDW 13.1  11.0 - 14.6 %   lymph# 3.3  0.9 - 3.3 10e3/uL   MONO# 0.7  0.1 - 0.9 10e3/uL   Eosinophils Absolute 0.3  0.0 - 0.5 10e3/uL   Basophils Absolute 0.1  0.0 - 0.1 10e3/uL   NEUT% 55.8  39.0 - 75.0 %   LYMPH% 33.5  14.0 - 49.0 %   MONO% 7.3  0.0 - 14.0 %   EOS% 2.9  0.0 - 7.0 %   BASO% 0.5  0.0 - 2.0 %    Lab Results  Component Value Date   WBC 9.7 03/03/2013   HGB 16.0 03/03/2013   HCT 46.9 03/03/2013   MCV 92.8 03/03/2013   PLT 243 03/03/2013   ASSESSMENT & PLAN:  #1 secondary erythrocytosis Erythropoietin level was normal. This  is consistent with secondary erythrocytosis, likely due to under treated obstructive sleep apnea. I recommend to have his CBC rechecked periodically and keep the hemoglobin under 16 g. His hemoglobin today is exactly at 16 g. He does not require further phlebotomy. I gave him an order so to have CBC rechecked in January with his primary care provider. The patient will call me if his hemoglobin is over 16. I will see him back again in 6 months. All questions were answered. The patient knows to call the clinic with any problems, questions or concerns. No barriers to learning was detected.  I spent 15 minutes counseling the patient face to face. The total time spent in the appointment was 20 minutes and more than 50% was on counseling.     [facility], Waymond Meador, MD 03/03/2013 3:46 PM

## 2013-03-03 NOTE — Telephone Encounter (Signed)
gv and printed appt sched and avs for pt for May 2015 °

## 2013-03-04 ENCOUNTER — Ambulatory Visit: Payer: BC Managed Care – PPO | Admitting: Hematology and Oncology

## 2013-03-21 ENCOUNTER — Other Ambulatory Visit: Payer: Self-pay | Admitting: Internal Medicine

## 2013-03-21 NOTE — Telephone Encounter (Signed)
rx refilled per protocol. DJR  

## 2013-04-23 ENCOUNTER — Telehealth: Payer: Self-pay

## 2013-04-23 NOTE — Telephone Encounter (Signed)
Left message for call back Non identifiable  Flu vaccine--01/2013 Tdap--11/2012 PNA--2014, 2004 Shingles--01/2010 CCS--05/2012--Dr Jacobs--neg--next due 2019 due to [place] PSA--04/2012--2.62

## 2013-04-25 ENCOUNTER — Ambulatory Visit (INDEPENDENT_AMBULATORY_CARE_PROVIDER_SITE_OTHER): Payer: BC Managed Care – PPO | Admitting: Internal Medicine

## 2013-04-25 ENCOUNTER — Encounter: Payer: Self-pay | Admitting: Internal Medicine

## 2013-04-25 VITALS — BP 157/82 | HR 60 | Temp 98.0°F | Ht 66.9 in | Wt 225.0 lb

## 2013-04-25 DIAGNOSIS — L989 Disorder of the skin and subcutaneous tissue, unspecified: Secondary | ICD-10-CM

## 2013-04-25 DIAGNOSIS — Z0000 Encounter for general adult medical examination without abnormal findings: Secondary | ICD-10-CM

## 2013-04-25 DIAGNOSIS — G4733 Obstructive sleep apnea (adult) (pediatric): Secondary | ICD-10-CM

## 2013-04-25 DIAGNOSIS — K219 Gastro-esophageal reflux disease without esophagitis: Secondary | ICD-10-CM

## 2013-04-25 DIAGNOSIS — I10 Essential (primary) hypertension: Secondary | ICD-10-CM

## 2013-04-25 DIAGNOSIS — D751 Secondary polycythemia: Secondary | ICD-10-CM

## 2013-04-25 LAB — CBC WITH DIFFERENTIAL/PLATELET
BASOS ABS: 0 10*3/uL (ref 0.0–0.1)
Basophils Relative: 0.6 % (ref 0.0–3.0)
EOS PCT: 3.1 % (ref 0.0–5.0)
Eosinophils Absolute: 0.2 10*3/uL (ref 0.0–0.7)
HCT: 50.8 % (ref 39.0–52.0)
Hemoglobin: 17.3 g/dL — ABNORMAL HIGH (ref 13.0–17.0)
Lymphocytes Relative: 25.6 % (ref 12.0–46.0)
Lymphs Abs: 1.9 10*3/uL (ref 0.7–4.0)
MCHC: 34 g/dL (ref 30.0–36.0)
MCV: 93.1 fl (ref 78.0–100.0)
MONO ABS: 0.5 10*3/uL (ref 0.1–1.0)
MONOS PCT: 7.1 % (ref 3.0–12.0)
NEUTROS PCT: 63.6 % (ref 43.0–77.0)
Neutro Abs: 4.8 10*3/uL (ref 1.4–7.7)
PLATELETS: 252 10*3/uL (ref 150.0–400.0)
RBC: 5.46 Mil/uL (ref 4.22–5.81)
RDW: 13 % (ref 11.5–14.6)
WBC: 7.5 10*3/uL (ref 4.5–10.5)

## 2013-04-25 LAB — COMPREHENSIVE METABOLIC PANEL
ALBUMIN: 4.2 g/dL (ref 3.5–5.2)
ALT: 38 U/L (ref 0–53)
AST: 26 U/L (ref 0–37)
Alkaline Phosphatase: 67 U/L (ref 39–117)
BUN: 16 mg/dL (ref 6–23)
CHLORIDE: 102 meq/L (ref 96–112)
CO2: 30 meq/L (ref 19–32)
Calcium: 9.3 mg/dL (ref 8.4–10.5)
Creatinine, Ser: 1.2 mg/dL (ref 0.4–1.5)
GFR: 63.28 mL/min (ref >60.00)
GLUCOSE: 148 mg/dL — AB (ref 70–99)
POTASSIUM: 4 meq/L (ref 3.5–5.1)
SODIUM: 138 meq/L (ref 135–145)
TOTAL PROTEIN: 7 g/dL (ref 6.0–8.3)
Total Bilirubin: 1.4 mg/dL — ABNORMAL HIGH (ref 0.3–1.2)

## 2013-04-25 LAB — PSA: PSA: 1.83 ng/mL (ref 0.10–4.00)

## 2013-04-25 LAB — TSH: TSH: 0.34 u[IU]/mL — AB (ref 0.35–5.50)

## 2013-04-25 NOTE — Assessment & Plan Note (Signed)
Having secondary polycythemia, advise patient to contact pulmonary, may need an adjustment on his CPAP

## 2013-04-25 NOTE — Assessment & Plan Note (Signed)
States he went to see a dermatologist, they removed the left ear lesions and was "precancerous". Was not recommended to return to the office, advise patient to monitor his skin

## 2013-04-25 NOTE — Assessment & Plan Note (Addendum)
Td ~ 2009 per pt Pneumonia shot 2004 Shingles shot 2011 Had a flu shot   cscope ~ 2008 in [place],  [place] again 05-2012, Dr Ardis Hughs, neg, next 2019 due to [place] Counseled: diet, exercise Labs  (Patient nonfasting, will check cholesterol on return to the office)

## 2013-04-25 NOTE — Progress Notes (Signed)
Subjective:    Patient ID: Donald Jones, male    DOB: [birth year], 68 y.o.   MRN: [id number]  HPI Here for a CPX ,in addition we discussed the following issues: Needs a  CBC for hematology Developed URI 2 weeks ago with sore throat, mild cough, taking  an antihistamine which helps. Still has some clear nasal discharge, symptoms are worsened today.  Past Medical History  Diagnosis Date  . Sleep apnea     on Cpap  . Hypertension   . Obesity     100-lb weight loss since 2011.   . Kidney stones     several episodes.  Last one in 2011.    Marland Kitchen GERD (gastroesophageal reflux disease)   . Polycythemia    Past Surgical History  Procedure Laterality Date  . Cholecystectomy    . Hand surgery  2010    R hand , d/t a injury, 3 surgeries   . Colonoscopy  06-09-02    Dr.Santogade   Family History  Problem Relation Age of Onset  . Diabetes Mother   . Coronary artery disease Mother   . Colon cancer Sister 68    age ~ 23  . Prostate cancer Neg Hx   . Stroke Father     M and F  . Heart disease Sister     ?  Marland Kitchen Alcohol abuse Sister   . Hypertension Father    History   Social History  . Marital Status: Widowed    Spouse Name: N/A    Number of Children: 3   . 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 Smoking status: Never Smoker   . Smokeless tobacco: Never Used  . Alcohol Use: No     Comment: none for 15 years  . Drug Use: No  . Sexual Activity: No   Other Topics Concern  . Not on file   Social History Narrative   1ST WIFE DIED FROM LUNG CANCER, 2ND WIFE DIED FROM RENAL CANCER.     Married x 4      Review of Systems Diet, Exercise-- going back to the gym this month, diet no changed but plans to change  Had CP few months ago--- resolved  No  SOB, lower extremity edema No orthopnea , DOE Denies  nausea, vomiting diarrhea Denies  blood in the stools  GERD  Sx-- well controlled  No dysuria, gross hematuria,  difficulty urinating   No anxiety, depression     Objective:   Physical Exam  BP 157/82  Pulse 60  Temp(Src) 98 F (36.7 C)  Ht 5' 6.9" (1.699 m)  Wt 225 lb (102.059 kg)  BMI 35.36 kg/m2  SpO2 98% General -- alert, well-developed, NAD.   HEENT-- Not pale.   Nose slt  congested.  Lungs -- normal respiratory effort, no intercostal retractions, no accessory muscle use, and normal breath sounds.  Heart-- normal rate, regular rhythm, no murmur.  Abdomen-- Not distended, good bowel sounds,soft, non-tender.  Rectal-- No external abnormalities noted. Normal sphincter tone. No rectal masses or tenderness. No  Stool found Prostate--Prostate gland firm and smooth, no enlargement, nodularity, tenderness, mass, asymmetry or induration. Extremities-- no pretibial edema bilaterally  Neurologic--  alert & oriented X3. Speech normal, gait normal, strength normal in all extremities.   Psych-- Cognition and judgment appear intact. Cooperative with normal attention span and concentration. No anxious or depressed appearing.  Assessment & Plan:  URI-- see instructions

## 2013-04-25 NOTE — Assessment & Plan Note (Signed)
Followup by hematology, felt to be secondary probably due to under treated sleep apnea. Will check a CBC. Patient was started on aspirin.

## 2013-04-25 NOTE — Patient Instructions (Signed)
Get your blood work before you leave      Next visit is for routine check up regards  hypertension, cholesterol  in 6 months ,  fasting Please make an appointment    Check the  blood pressure 2 or 3 times a month   be sure it is between 110/60 and 140/85. Ideal blood pressure is 120/80. If it is consistently higher or lower, let me know  For the cold: Rest, fluids , tylenol For cough, take Mucinex DM twice a day as needed  For congestion use OTC Nasocort: 2 nasal sprays on each side of the nose daily until you feel better  Call if no better in few days Call anytime if the symptoms are severe    If you need more information about a healthy diet  visit  the American Heart Association, it  is a great resource online at:  http://www.richard-flynn.net/

## 2013-04-25 NOTE — Telephone Encounter (Signed)
Unable to reach pre visit.  

## 2013-04-25 NOTE — Assessment & Plan Note (Signed)
BP slightly elevated today but otherwise normal when checked. No change, self monitoring.

## 2013-04-25 NOTE — Progress Notes (Signed)
Pre visit review using our clinic review tool, if applicable. No additional management support is needed unless otherwise documented below in the visit note. 

## 2013-04-25 NOTE — Assessment & Plan Note (Signed)
Doing great on PPIs

## 2013-04-27 ENCOUNTER — Encounter: Payer: Self-pay | Admitting: Internal Medicine

## 2013-04-28 ENCOUNTER — Ambulatory Visit: Payer: BC Managed Care – PPO

## 2013-04-28 DIAGNOSIS — Z0000 Encounter for general adult medical examination without abnormal findings: Secondary | ICD-10-CM

## 2013-04-28 LAB — T4, FREE: Free T4: 1.03 ng/dL (ref 0.60–1.60)

## 2013-04-28 LAB — T3, FREE: T3 FREE: 3.1 pg/mL (ref 2.3–4.2)

## 2013-04-29 ENCOUNTER — Ambulatory Visit: Payer: BC Managed Care – PPO

## 2013-04-29 ENCOUNTER — Telehealth: Payer: Self-pay | Admitting: Internal Medicine

## 2013-04-29 DIAGNOSIS — Z0000 Encounter for general adult medical examination without abnormal findings: Secondary | ICD-10-CM

## 2013-04-29 LAB — HEMOGLOBIN A1C: HEMOGLOBIN A1C: 5.9 % (ref 4.6–6.5)

## 2013-04-29 NOTE — Telephone Encounter (Signed)
Advise patient: His blood sugar is slightly elevated, does not need any medication but definitely recommend to work on healthy diet ,stay active and lose some weight. His hemoglobin is 17.3, I sent a message to Dr Alvy Bimler The prostate, liver, kidney and potassium tests are normal. One of the thyroid test was slightly off and needs to be recheck when he comes back in 6 months. Overall good results.

## 2013-04-30 ENCOUNTER — Other Ambulatory Visit: Payer: Self-pay | Admitting: Hematology and Oncology

## 2013-04-30 ENCOUNTER — Telehealth: Payer: Self-pay | Admitting: *Deleted

## 2013-04-30 DIAGNOSIS — D751 Secondary polycythemia: Secondary | ICD-10-CM

## 2013-04-30 NOTE — Telephone Encounter (Signed)
Pt called back and left VM.  Called pt's home and s/w wife.  Informed her of order for phlebotomy and to expect call from [place].  She verbalized understanding and will relay message to pt.

## 2013-04-30 NOTE — Telephone Encounter (Signed)
Pt.notified

## 2013-04-30 NOTE — Telephone Encounter (Signed)
Message copied by Cathlean Cower on Wed Apr 30, 2013 10:37 AM ------      Message from: [facility], [place]: Wed Apr 30, 2013 10:20 AM      Regarding: need phlebotomy       Hi,            I just sent a POF for this pt to get phlebotomy. Please call pt      Thanks       ------

## 2013-04-30 NOTE — Telephone Encounter (Signed)
Left VM for pt to return nurse's call.  

## 2013-05-02 ENCOUNTER — Telehealth: Payer: Self-pay | Admitting: Hematology and Oncology

## 2013-05-02 ENCOUNTER — Telehealth: Payer: Self-pay | Admitting: *Deleted

## 2013-05-02 NOTE — Telephone Encounter (Signed)
Per staff message and POF I have scheduled appts.  JMW  

## 2013-05-02 NOTE — Telephone Encounter (Signed)
, °

## 2013-05-07 ENCOUNTER — Ambulatory Visit (HOSPITAL_BASED_OUTPATIENT_CLINIC_OR_DEPARTMENT_OTHER): Payer: BC Managed Care – PPO

## 2013-05-07 VITALS — BP 131/74 | HR 53 | Temp 97.7°F | Resp 18

## 2013-05-07 DIAGNOSIS — D751 Secondary polycythemia: Secondary | ICD-10-CM

## 2013-05-07 NOTE — Patient Instructions (Signed)

## 2013-05-07 NOTE — Progress Notes (Signed)
Patient given more po fluids to drink.  He was discharged at 1546.  He denied any dizziness when he stood.  He is aware of his next appointment, however reviewed with him to please call us if he has any sx before then and we can certainly check his hemoglobin.  Encouraged patient to rest, continue to drink fluids and no heavy lifting the rest of day.  Patients pulse was at 53 at time of discharge which is nearer his baseline.  He did admit to being nervous before phlebotomy (pulse was 80 at that time.)

## 2013-06-24 ENCOUNTER — Other Ambulatory Visit: Payer: Self-pay | Admitting: Internal Medicine

## 2013-07-17 ENCOUNTER — Encounter: Payer: Self-pay | Admitting: Internal Medicine

## 2013-07-17 ENCOUNTER — Ambulatory Visit (INDEPENDENT_AMBULATORY_CARE_PROVIDER_SITE_OTHER): Payer: BC Managed Care – PPO | Admitting: Internal Medicine

## 2013-07-17 VITALS — BP 143/83 | HR 55 | Temp 98.5°F | Wt 220.0 lb

## 2013-07-17 DIAGNOSIS — R197 Diarrhea, unspecified: Secondary | ICD-10-CM

## 2013-07-17 MED ORDER — DICYCLOMINE HCL 20 MG PO TABS: 20.0000 mg | ORAL_TABLET | Freq: Three times a day (TID) | ORAL | Status: DC | PRN

## 2013-07-17 NOTE — Progress Notes (Signed)
Subjective:    Patient ID: Donald Jones, male    DOB: 17-Apr-1946, 68 y.o.   MRN: [id number]  DOS:  07/17/2013 Type of  visit: Acute visit  Symptoms started 3 days ago with upper and lower crampy abdominal pain on and off, the next day he developed watery diarrhea without blood in the stools, maybe with some mucus. He took bentyl as needed and also yesterday he took 2 Imodium this.  Diarrhea is slowing down, had no diarrhea today. The abd pain has also decreased    ROS Denies fever or chills. Good by mouth tolerance, appetite is okay Has mild nausea but no vomiting. Other family members are afflicted with similar sx  Past Medical History  Diagnosis Date  . Sleep apnea     on Cpap  . Hypertension   . Obesity     100-lb weight loss since 2011.   . Kidney stones     several episodes.  Last one in 2011.    Marland Kitchen GERD (gastroesophageal reflux disease)   . Polycythemia     Past Surgical History  Procedure Laterality Date  . Cholecystectomy    . Hand surgery  2010    R hand , d/t a injury, 3 surgeries   . Colonoscopy  06-09-02    Dr.Santogade    History   Social History  . Marital Status: Widowed    Spouse Name: N/A    Number of Children: 3   . 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 Smoking status: Never Smoker   . Smokeless tobacco: Never Used  . Alcohol Use: No     Comment: none for 15 years  . Drug Use: No  . Sexual Activity: No   Other Topics Concern  . Not on file   Social History Narrative   1ST WIFE DIED FROM LUNG CANCER, 2ND WIFE DIED FROM RENAL CANCER.     Married x 4         Medication List       This list is accurate as of: 07/17/13  1:50 PM.  Always use your most recent med list.               acetaminophen 325 MG tablet  Commonly known as:  TYLENOL  Take 650 mg by mouth every 6 (six) hours as needed for pain.     ANTI-FUNGAL EX  Apply 1 application topically daily as needed (for  foot fungus.).     aspirin 81 MG tablet  Take 81 mg by mouth daily.     B-12 500 MCG Tabs  Take 1 tablet by mouth daily.     dicyclomine 20 MG tablet  Commonly known as:  BENTYL  Take 1 tablet (20 mg total) by mouth 3 (three) times daily as needed.     Fish Oil 1000 MG Caps  Take by mouth.     hydrochlorothiazide 25 MG tablet  Commonly known as:  HYDRODIURIL  TAKE 1 TABLET BY MOUTH EVERY MORNING     multivitamin tablet  Take 1 tablet by mouth daily.     omeprazole 40 MG capsule  Commonly known as:  PRILOSEC  Take 1 capsule (40 mg total) by mouth daily.     SINUS BUSTER NA  Place 1 puff into the nose daily as needed.           Objective:   Physical Exam BP 143/83  Pulse 55  Temp(Src) 98.5 F (36.9 C)  Wt 220 lb (99.791 kg)  SpO2 98%  General -- alert, well-developed, NAD.   HEENT-- Not pale or jaundice Lungs -- normal respiratory effort, no intercostal retractions, no accessory muscle use, and normal breath sounds.  Heart-- normal rate, regular rhythm, no murmur.  Abdomen-- Not distended, + bowel sounds,soft,  Minimal tenderness throughout w/o mass-rebound-rigidity Extremities-- no pretibial edema bilaterally   Psych-- Cognition and judgment appear intact. Cooperative with normal attention span and concentration. No anxious or depressed appearing.       Assessment & Plan:   Diarrhea, Acute diarrhea in the setting of other family members with similar symptoms. Already getting better. Recommend as needed Imodium and Bentyl, bland diet. See instructions

## 2013-07-17 NOTE — Patient Instructions (Signed)
Drink plenty of clear fluids. Follow a bland diet (chicken soup with rice for instance) and gradually go back to a normal diet. Okay to bentyl and Imodium as needed Call if not back to normal in few days, call if you have severe symptoms  Next appointment should be by June 2015

## 2013-07-17 NOTE — Progress Notes (Signed)
Pre visit review using our clinic review tool, if applicable. No additional management support is needed unless otherwise documented below in the visit note. 

## 2013-09-01 ENCOUNTER — Other Ambulatory Visit (HOSPITAL_BASED_OUTPATIENT_CLINIC_OR_DEPARTMENT_OTHER): Payer: BC Managed Care – PPO

## 2013-09-01 ENCOUNTER — Ambulatory Visit (HOSPITAL_BASED_OUTPATIENT_CLINIC_OR_DEPARTMENT_OTHER): Payer: BC Managed Care – PPO

## 2013-09-01 ENCOUNTER — Encounter: Payer: Self-pay | Admitting: Hematology and Oncology

## 2013-09-01 ENCOUNTER — Ambulatory Visit (HOSPITAL_BASED_OUTPATIENT_CLINIC_OR_DEPARTMENT_OTHER): Payer: BC Managed Care – PPO | Admitting: Hematology and Oncology

## 2013-09-01 VITALS — BP 153/73 | HR 56 | Temp 98.7°F | Resp 20 | Ht 69.5 in | Wt 225.9 lb

## 2013-09-01 DIAGNOSIS — D751 Secondary polycythemia: Secondary | ICD-10-CM

## 2013-09-01 DIAGNOSIS — G4733 Obstructive sleep apnea (adult) (pediatric): Secondary | ICD-10-CM

## 2013-09-01 LAB — CBC & DIFF AND RETIC
BASO%: 0.4 % (ref 0.0–2.0)
Basophils Absolute: 0 10*3/uL (ref 0.0–0.1)
EOS%: 4 % (ref 0.0–7.0)
Eosinophils Absolute: 0.3 10*3/uL (ref 0.0–0.5)
HEMATOCRIT: 49 % (ref 38.4–49.9)
HGB: 17.3 g/dL — ABNORMAL HIGH (ref 13.0–17.1)
Immature Retic Fract: 3 % (ref 3.00–10.60)
LYMPH%: 29 % (ref 14.0–49.0)
MCH: 31.7 pg (ref 27.2–33.4)
MCHC: 35.3 g/dL (ref 32.0–36.0)
MCV: 89.7 fL (ref 79.3–98.0)
MONO#: 0.6 10*3/uL (ref 0.1–0.9)
MONO%: 7.9 % (ref 0.0–14.0)
NEUT#: 4.2 10*3/uL (ref 1.5–6.5)
NEUT%: 58.7 % (ref 39.0–75.0)
Platelets: 204 10*3/uL (ref 140–400)
RBC: 5.46 10*6/uL (ref 4.20–5.82)
RDW: 13 % (ref 11.0–14.6)
RETIC %: 1.71 % (ref 0.80–1.80)
RETIC CT ABS: 93.37 10*3/uL (ref 34.80–93.90)
WBC: 7.2 10*3/uL (ref 4.0–10.3)
lymph#: 2.1 10*3/uL (ref 0.9–3.3)

## 2013-09-01 LAB — FERRITIN CHCC: FERRITIN: 54 ng/mL (ref 22–316)

## 2013-09-01 NOTE — Patient Instructions (Signed)

## 2013-09-01 NOTE — Progress Notes (Signed)
Phlebotomy completed per MD order. 1 unit of blood removed from right antecubital. Patient observed 30 minutes after phlebotomy. No problems or complications noted. Patient discharged home. Cindi Carbon, RN

## 2013-09-01 NOTE — Progress Notes (Signed)
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 Further workup excluded myeloproliferative disorder.  On 02/24/2013  and January 2015, we remove one unit of blood each time  INTERVAL HISTORY: Donald Jones 68 y.o. male returns for further followup. He feels well. He is compliant with aspirin therapy. He denies any chest pain, dizziness, leg cramps or any problems with each phlebotomy. No recent diagnosis of blood clots. The patient was recently diagnosed with irritable bowel syndrome.  I have reviewed the past medical history, past surgical history, social history and family history with the patient and they are unchanged from previous note.  ALLERGIES:  is allergic to sulfa antibiotics.  MEDICATIONS:  Current Outpatient Prescriptions  Medication Sig Dispense Refill  . acetaminophen (TYLENOL) 325 MG tablet Take 650 mg by mouth every 6 (six) hours as needed for pain.       Marland Kitchen aspirin 81 MG tablet Take 81 mg by mouth daily.      . Cyanocobalamin (B-12) 500 MCG TABS Take 1 tablet by mouth daily.      Marland Kitchen dicyclomine (BENTYL) 20 MG tablet Take 1 tablet (20 mg total) by mouth 3 (three) times daily as needed.  180 tablet  1  . Homeopathic Products (SINUS BUSTER NA) Place 1 puff into the nose daily as needed.      . hydrochlorothiazide (HYDRODIURIL) 25 MG tablet TAKE 1 TABLET BY MOUTH EVERY MORNING  90 tablet  1  . Multiple Vitamin (MULTIVITAMIN) tablet Take 1 tablet by mouth daily.       . Omega-3 Fatty Acids (FISH OIL) 1000 MG CAPS Take by mouth.      Marland Kitchen omeprazole (PRILOSEC) 40 MG capsule Take 1 capsule (40 mg total) by mouth daily.  90 capsule  3  . Tolnaftate (ANTI-FUNGAL EX) Apply 1 application topically daily as needed (for foot fungus.).       Current Facility-Administered  Medications  Medication Dose Route Frequency Provider Last Rate Last Dose  . nitroGLYCERIN (NITROSTAT) SL tablet 0.4 mg  0.4 mg Sublingual Once Gay Filler Copland, MD         REVIEW OF SYSTEMS:   Constitutional: Denies fevers, chills or night sweats Eyes: Denies blurriness of vision Ears, nose, mouth, throat, and face: Denies mucositis or sore throat Respiratory: Denies cough, dyspnea or wheezes Cardiovascular: Denies palpitation, chest discomfort or lower extremity swelling Skin: Denies abnormal skin rashes Lymphatics: Denies new lymphadenopathy or easy bruising Neurological:Denies numbness, tingling or new weaknesses Behavioral/Psych: Mood is stable, no new changes  All other systems were reviewed with the patient and are negative.  PHYSICAL EXAMINATION: ECOG PERFORMANCE STATUS: 0 - Asymptomatic  Filed Vitals:   09/01/13 1452  BP: 153/73  Pulse: 56  Temp: 98.7 F (37.1 C)  Resp: 20   Filed Weights   09/01/13 1452  Weight: 225 lb 14.4 oz (102.468 kg)    GENERAL:alert, no distress and comfortable. He looks plethoric  SKIN: skin color, texture, turgor are normal, no rashes or significant lesions EYES: normal, Conjunctiva are pink and non-injected, sclera clear OROPHARYNX:no exudate, no erythema and lips, buccal mucosa, and tongue normal  NECK: supple, thyroid normal size, non-tender, without nodularity LUNGS: clear to auscultation and percussion with normal breathing effort HEART: regular rate & rhythm and no murmurs and no lower extremity edema NEURO: alert & oriented  x 3 with fluent speech, no focal motor/sensory deficits  LABORATORY DATA:  I have reviewed the data as listed Results for orders placed in visit on 09/01/13 (from the past 48 hour(s))  CBC & DIFF AND RETIC     Status: Abnormal   Collection Time    09/01/13  2:29 PM      Result Value Ref Range   WBC 7.2  4.0 - 10.3 10e3/uL   NEUT# 4.2  1.5 - 6.5 10e3/uL   HGB 17.3 (*) 13.0 - 17.1 g/dL   HCT 49.0  38.4 -  49.9 %   Platelets 204  140 - 400 10e3/uL   MCV 89.7  79.3 - 98.0 fL   MCH 31.7  27.2 - 33.4 pg   MCHC 35.3  32.0 - 36.0 g/dL   RBC 5.46  4.20 - 5.82 10e6/uL   RDW 13.0  11.0 - 14.6 %   lymph# 2.1  0.9 - 3.3 10e3/uL   MONO# 0.6  0.1 - 0.9 10e3/uL   Eosinophils Absolute 0.3  0.0 - 0.5 10e3/uL   Basophils Absolute 0.0  0.0 - 0.1 10e3/uL   NEUT% 58.7  39.0 - 75.0 %   LYMPH% 29.0  14.0 - 49.0 %   MONO% 7.9  0.0 - 14.0 %   EOS% 4.0  0.0 - 7.0 %   BASO% 0.4  0.0 - 2.0 %   Retic % 1.71  0.80 - 1.80 %   Retic Ct Abs 93.37  34.80 - 93.90 10e3/uL   Immature Retic Fract 3.00  3.00 - 10.60 %    Lab Results  Component Value Date   WBC 7.2 09/01/2013   HGB 17.3* 09/01/2013   HCT 49.0 09/01/2013   MCV 89.7 09/01/2013   PLT 204 09/01/2013   ASSESSMENT & PLAN:  #1 secondary erythrocytosis Erythropoietin level was normal. This is consistent with secondary erythrocytosis, likely due to under treated obstructive sleep apnea. I recommend to have his CBC rechecked periodically and keep the hemoglobin under 16 g. The patient will remain on aspirin indefinitely. I will order another unit of blood to be removed today. I recommend the patient go to blood bank to attempt blood donation every 3 months to keep his hemoglobin down. I have not make a return appointment for the patient to come back unless he gets refused from blood donation the patient will call me. All questions were answered. The patient knows to call the clinic with any problems, questions or concerns. No barriers to learning was detected.  I spent 15 minutes counseling the patient face to face. The total time spent in the appointment was 20 minutes and more than 50% was on counseling.     Heath Lark, MD 09/01/2013 3:05 PM

## 2013-10-04 ENCOUNTER — Ambulatory Visit (INDEPENDENT_AMBULATORY_CARE_PROVIDER_SITE_OTHER): Payer: BC Managed Care – PPO | Admitting: Family Medicine

## 2013-10-04 VITALS — BP 128/78 | HR 72 | Temp 98.5°F | Ht 66.5 in | Wt 220.0 lb

## 2013-10-04 DIAGNOSIS — M545 Low back pain, unspecified: Secondary | ICD-10-CM

## 2013-10-04 LAB — POCT UA - MICROSCOPIC ONLY
Bacteria, U Microscopic: NEGATIVE
CASTS, UR, LPF, POC: NEGATIVE
CRYSTALS, UR, HPF, POC: NEGATIVE
Mucus, UA: NEGATIVE
RBC, urine, microscopic: NEGATIVE
Yeast, UA: NEGATIVE

## 2013-10-04 LAB — POCT URINALYSIS DIPSTICK
Bilirubin, UA: NEGATIVE
Glucose, UA: NEGATIVE
Ketones, UA: NEGATIVE
Leukocytes, UA: NEGATIVE
Nitrite, UA: NEGATIVE
PH UA: 7
PROTEIN UA: NEGATIVE
RBC UA: NEGATIVE
Spec Grav, UA: 1.01
UROBILINOGEN UA: 0.2

## 2013-10-04 MED ORDER — TRAMADOL HCL 50 MG PO TABS: 50.0000 mg | ORAL_TABLET | Freq: Three times a day (TID) | ORAL | Status: DC | PRN

## 2013-10-04 NOTE — Patient Instructions (Signed)
Your back pain does not seem to be either a kidney infection or stone.   I have sent in some pain relievers if you need them.  This is likely from the IBS.  If you start having pain when you pee, fevers, chills, vomiting, or other concerns come back and see [place].    It was good to meet you today

## 2013-10-04 NOTE — Progress Notes (Signed)
Donald Jones is a 68 y.o. male who presents to Urgent Care today with concern for UTI vs kidney stone:  1.  Back pain:  Started on Wednesday.  Right sided flank pain.  States radiates to Right abdomen.  Pain lasted until Friday, resolved yesterday PM.  Some very mild back pain today and also radiating to Left lumbar region.  Has history of kidney stones.  Last was 5 years ago.  States does not feel like that.  No dysuria, no urgency, no frequency, no hesitancy.  No fevers, chills, nausea, vomiting.  Eating and drinking well.  No malaise.  Has not tried any OTC analgesics for relief.       PMH reviewed.  Past Medical History  Diagnosis Date  . Sleep apnea     on Cpap  . Hypertension   . Obesity     100-lb weight loss since 2011.   . Kidney stones     several episodes.  Last one in 2011.    Marland Kitchen GERD (gastroesophageal reflux disease)   . Polycythemia    Past Surgical History  Procedure Laterality Date  . Cholecystectomy    . Hand surgery  2010    R hand , d/t a injury, 3 surgeries   . Colonoscopy  06-09-02    Dr.Santogade    Medications reviewed. Current Outpatient Prescriptions  Medication Sig Dispense Refill  . acetaminophen (TYLENOL) 325 MG tablet Take 650 mg by mouth every 6 (six) hours as needed for pain.       Marland Kitchen aspirin 81 MG tablet Take 81 mg by mouth daily.      . Cyanocobalamin (B-12) 500 MCG TABS Take 1 tablet by mouth daily.      Marland Kitchen dicyclomine (BENTYL) 20 MG tablet Take 1 tablet (20 mg total) by mouth 3 (three) times daily as needed.  180 tablet  1  . Homeopathic Products (SINUS BUSTER NA) Place 1 puff into the nose daily as needed.      . hydrochlorothiazide (HYDRODIURIL) 25 MG tablet TAKE 1 TABLET BY MOUTH EVERY MORNING  90 tablet  1  . Multiple Vitamin (MULTIVITAMIN) tablet Take 1 tablet by mouth daily.       . Omega-3 Fatty Acids (FISH OIL) 1000 MG CAPS Take by mouth.      Marland Kitchen omeprazole (PRILOSEC) 40 MG capsule Take 1 capsule (40 mg total) by mouth daily.  90 capsule   3  . Tolnaftate (ANTI-FUNGAL EX) Apply 1 application topically daily as needed (for foot fungus.).       Current Facility-Administered Medications  Medication Dose Route Frequency Oluwaseun Bruyere Last Rate Last Dose  . nitroGLYCERIN (NITROSTAT) SL tablet 0.4 mg  0.4 mg Sublingual Once Gay Filler Copland, MD        ROS as above otherwise neg.  No chest pain, palpitations, SOB, Fever, Chills, Abd pain, N/V/D.   Physical Exam:  BP 128/78  Pulse 72  Temp(Src) 98.5 F (36.9 C) (Oral)  Ht 5' 6.5" (1.689 m)  Wt 220 lb (99.791 kg)  BMI 34.98 kg/m2  SpO2 97% Gen:  Alert, cooperative patient who appears stated age in no acute distress.  Vital signs reviewed.  Ruddy complexion, well-tanned face, arms, chest. HEENT: EOMI,  MMM Pulm:  Clear to auscultation bilaterally with good air movement.  Cardiac:  Regular rate and rhythm without murmur auscultated.   Abd:  Soft/nondistended.  Minimally tender Right flank. No lower quandrant pain.   Back:  Minimal lumbar pain BL.  No actual CVA  tenderness.   Results for orders placed in visit on 10/04/13  POCT URINALYSIS DIPSTICK      Result Value Ref Range   Color, UA yellow     Clarity, UA clear     Glucose, UA neg     Bilirubin, UA neg     Ketones, UA neg     Spec Grav, UA 1.010     Blood, UA neg     pH, UA 7.0     Protein, UA neg     Urobilinogen, UA 0.2     Nitrite, UA neg     Leukocytes, UA Negative    POCT UA - MICROSCOPIC ONLY      Result Value Ref Range   WBC, Ur, HPF, POC 0-1     RBC, urine, microscopic neg     Bacteria, U Microscopic neg     Mucus, UA neg     Epithelial cells, urine per micros 0-1     Crystals, Ur, HPF, POC neg     Casts, Ur, LPF, POC neg     Yeast, UA neg      Assessment and Plan:  1.  Back pain: - sounds more like muscle pain than kidney stone, especially as radiates to left lower back - Urine completely negative.  No evidence of UTI nor stone currently. - Discussed likely MSK pain with patient.  Warning  precautions provided.   - Attempt OTC analgesics if needed.  Prescription for Tramadol if required. - also with extensive history of IBS.  Diarrhea has been persistent since Monday.  No abdominal pain with this except with BM's.  This is also possibility of etiology.

## 2013-10-13 ENCOUNTER — Other Ambulatory Visit: Payer: Self-pay | Admitting: Internal Medicine

## 2013-10-24 ENCOUNTER — Ambulatory Visit (INDEPENDENT_AMBULATORY_CARE_PROVIDER_SITE_OTHER): Payer: BC Managed Care – PPO | Admitting: Internal Medicine

## 2013-10-24 ENCOUNTER — Encounter: Payer: Self-pay | Admitting: Internal Medicine

## 2013-10-24 VITALS — BP 132/83 | HR 55 | Temp 97.8°F | Wt 221.0 lb

## 2013-10-24 DIAGNOSIS — K589 Irritable bowel syndrome without diarrhea: Secondary | ICD-10-CM

## 2013-10-24 DIAGNOSIS — I10 Essential (primary) hypertension: Secondary | ICD-10-CM

## 2013-10-24 DIAGNOSIS — E669 Obesity, unspecified: Secondary | ICD-10-CM

## 2013-10-24 DIAGNOSIS — R7989 Other specified abnormal findings of blood chemistry: Secondary | ICD-10-CM

## 2013-10-24 LAB — TSH: TSH: 0.3 u[IU]/mL — ABNORMAL LOW (ref 0.35–4.50)

## 2013-10-24 LAB — T3, FREE: T3 FREE: 3.3 pg/mL (ref 2.3–4.2)

## 2013-10-24 LAB — T4, FREE: FREE T4: 1.06 ng/dL (ref 0.60–1.60)

## 2013-10-24 NOTE — Assessment & Plan Note (Signed)
Last A1c was slightly elevated, we again discussed the need to diet and exercise

## 2013-10-24 NOTE — Progress Notes (Signed)
Pre visit review using our clinic review tool, if applicable. No additional management support is needed unless otherwise documented below in the visit note. 

## 2013-10-24 NOTE — Assessment & Plan Note (Signed)
reports that overall feels better when he takes [place]

## 2013-10-24 NOTE — Patient Instructions (Signed)
Get your blood work before you leave   Next visit is for a physical exam by 04-2014 fasting Please make an appointment

## 2013-10-24 NOTE — Progress Notes (Signed)
Subjective:    Patient ID: Donald Jones, male    DOB: Feb 02, 1946, 67 y.o.   MRN: [id number]  DOS:  10/24/2013 Type of visit - description: ROV History: Since the last time he was here he is doing well. Ambulatory BPs are usually normal GERD symptoms well controlled Last TSH suppressed, due for repeat labs   ROS Denies chest pain or difficulty breathing. No weight loss. No headache or dizziness  Past Medical History  Diagnosis Date  . Sleep apnea     on Cpap  . Hypertension   . Obesity     100-lb weight loss since 2011.   . Kidney stones     several episodes.  Last one in 2011.    Marland Kitchen GERD (gastroesophageal reflux disease)   . Polycythemia     Past Surgical History  Procedure Laterality Date  . Cholecystectomy    . Hand surgery  2010    R hand , d/t a injury, 3 surgeries   . Colonoscopy  06-09-02    Dr.Santogade    History   Social History  . Marital Status: Widowed    Spouse Name: N/A    Number of Children: 3   . 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 Smoking status: Never Smoker   . Smokeless tobacco: Never Used  . Alcohol Use: No     Comment: none for 15 years  . Drug Use: No  . Sexual Activity: No   Other Topics Concern  . Not on file   Social History Narrative   1ST WIFE DIED FROM LUNG CANCER, 2ND WIFE DIED FROM RENAL CANCER.     Married x 4         Medication List       This list is accurate as of: 10/24/13 11:59 PM.  Always use your most recent med list.               acetaminophen 325 MG tablet  Commonly known as:  TYLENOL  Take 650 mg by mouth every 6 (six) hours as needed for pain.     ANTI-FUNGAL EX  Apply 1 application topically daily as needed (for foot fungus.).     aspirin 81 MG tablet  Take 81 mg by mouth daily.     B-12 500 MCG Tabs  Take 1 tablet by mouth daily.     dicyclomine 20 MG tablet  Commonly known as:  BENTYL  Take 1 tablet (20 mg total) by mouth 3  (three) times daily as needed.     Fish Oil 1000 MG Caps  Take by mouth.     hydrochlorothiazide 25 MG tablet  Commonly known as:  HYDRODIURIL  TAKE 1 TABLET BY MOUTH EVERY MORNING     multivitamin tablet  Take 1 tablet by mouth daily.     omeprazole 40 MG capsule  Commonly known as:  PRILOSEC  TAKE 1 CAPSULE BY MOUTH DAILY.     SINUS BUSTER NA  Place 1 puff into the nose daily as needed.     traMADol 50 MG tablet  Commonly known as:  ULTRAM  Take 1 tablet (50 mg total) by mouth every 8 (eight) hours as needed.           Objective:   Physical Exam BP 132/83  Pulse 55  Temp(Src) 97.8 F (36.6 C)  Wt 221 lb (100.245 kg)  SpO2 96% General -- alert,  well-developed, NAD.   Lungs -- normal respiratory effort, no intercostal retractions, no accessory muscle use, and normal breath sounds.  Heart-- normal rate, regular rhythm, no murmur.   Extremities-- no pretibial edema bilaterally  Neurologic--  alert & oriented X3. Speech normal, gait appropriate for age, strength symmetric and appropriate for age.  Psych-- Cognition and judgment appear intact. Cooperative with normal attention span and concentration. No anxious or depressed appearing.        Assessment & Plan:    Abnormal TSH, check labs

## 2013-10-24 NOTE — Assessment & Plan Note (Signed)
Good compliance of medication, normal ambulatory BPs, last BMP normal, no change

## 2013-10-27 ENCOUNTER — Telehealth: Payer: Self-pay | Admitting: Internal Medicine

## 2013-10-27 NOTE — Telephone Encounter (Signed)
Relevant patient education assigned to patient using Emmi. ° °

## 2013-11-28 ENCOUNTER — Ambulatory Visit: Payer: BC Managed Care – PPO | Admitting: Pulmonary Disease

## 2013-12-01 ENCOUNTER — Ambulatory Visit (INDEPENDENT_AMBULATORY_CARE_PROVIDER_SITE_OTHER): Payer: BC Managed Care – PPO | Admitting: Pulmonary Disease

## 2013-12-01 ENCOUNTER — Encounter: Payer: Self-pay | Admitting: Pulmonary Disease

## 2013-12-01 VITALS — BP 110/70 | HR 57 | Temp 97.9°F | Ht 65.0 in | Wt 224.4 lb

## 2013-12-01 DIAGNOSIS — G4733 Obstructive sleep apnea (adult) (pediatric): Secondary | ICD-10-CM

## 2013-12-01 NOTE — Progress Notes (Signed)
   Subjective:    Patient ID: Donald Jones, male    DOB: 1946-01-19, 68 y.o.   MRN: [id number]  HPI The patient comes in today for followup of his obstructive sleep apnea. He is wearing CPAP compliantly, and is having no issues with his mask fit. His only complaint is that his lower pressure limit on the automatic setting does not seem high enough when he initially puts on the CPAP device. I have told him that we can increase this. Overall, he feels that he is sleeping well with the device, and is satisfied with his daytime alertness. The patient's weight is fairly stable from the last visit a year.   Review of Systems  Constitutional: Negative for fever and unexpected weight change.  HENT: Negative for congestion, dental problem, ear pain, nosebleeds, postnasal drip, rhinorrhea, sinus pressure, sneezing, sore throat and trouble swallowing.   Eyes: Negative for redness and itching.  Respiratory: Negative for cough, chest tightness, shortness of breath and wheezing.   Cardiovascular: Negative for palpitations and leg swelling.  Gastrointestinal: Negative for nausea and vomiting.  Genitourinary: Negative for dysuria.  Musculoskeletal: Negative for joint swelling.  Skin: Negative for rash.  Neurological: Negative for headaches.  Hematological: Does not bruise/bleed easily.  Psychiatric/Behavioral: Negative for dysphoric mood. The patient is not nervous/anxious.        Objective:   Physical Exam Overweight male in no acute distress Nose without purulence or discharge noted Neck without lymphadenopathy or thyromegaly No skin breakdown or pressure necrosis from the CPAP mask Lower extremities without edema, no cyanosis Alert and oriented, moves all 4 extremities.       Assessment & Plan:

## 2013-12-01 NOTE — Assessment & Plan Note (Signed)
The patient is doing extremely well on CPAP, and feels that he sleeps well with adequate daytime alertness. He would like to have a little more initial pressure, and we'll therefore change the lower limit of his setting to 7 cm of water. I've also encouraged him to work aggressively on weight loss, and to keep up with his mask changes and supplies.

## 2013-12-01 NOTE — Patient Instructions (Signed)
Will have your lower pressure limit increased to 7.  Let me know if this is not enough. Keep working on weight loss, and keep up with mask changes and supplies. followup with me again in one year.

## 2013-12-12 ENCOUNTER — Other Ambulatory Visit: Payer: Self-pay | Admitting: Pulmonary Disease

## 2013-12-12 DIAGNOSIS — G4733 Obstructive sleep apnea (adult) (pediatric): Secondary | ICD-10-CM

## 2013-12-23 ENCOUNTER — Other Ambulatory Visit: Payer: Self-pay

## 2013-12-23 MED ORDER — HYDROCHLOROTHIAZIDE 25 MG PO TABS: ORAL_TABLET | ORAL | Status: DC

## 2014-01-27 ENCOUNTER — Encounter: Payer: Self-pay | Admitting: Internal Medicine

## 2014-01-27 ENCOUNTER — Ambulatory Visit (INDEPENDENT_AMBULATORY_CARE_PROVIDER_SITE_OTHER): Payer: BC Managed Care – PPO | Admitting: Internal Medicine

## 2014-01-27 VITALS — BP 142/88 | HR 60 | Temp 98.7°F | Wt 225.5 lb

## 2014-01-27 DIAGNOSIS — K589 Irritable bowel syndrome without diarrhea: Secondary | ICD-10-CM

## 2014-01-27 DIAGNOSIS — Z23 Encounter for immunization: Secondary | ICD-10-CM

## 2014-01-27 NOTE — Progress Notes (Signed)
Pre visit review using our clinic review tool, if applicable. No additional management support is needed unless otherwise documented below in the visit note. 

## 2014-01-27 NOTE — Assessment & Plan Note (Addendum)
One-day history of GI symptoms as described above, on further questioning, he has these symptoms on and off. Chart is reviewed: ---cscope ~ 2008 in [place], [place] again 05-2012, Dr Ardis Hughs, neg, next 2019 due to [place] ---CT abd.  01-2012 neg In the past he was diagnosed with IBS, I agreed that that is probably the culprit of his symptoms. Recommend to go back on [place], take Bentyl as needed (he does not been sleepy on it) and call if he is not improving (Symptoms could be related with early appendicitis or diverticulitis, patient aware). See instructions

## 2014-01-27 NOTE — Patient Instructions (Addendum)
Go back on Activia  daily Take diciclomine  as needed Call anytime if you have severe symptoms, fever, chills, blood in the stools or if you are not gradually improving in few days Next visit by January 2016 for your physical

## 2014-01-27 NOTE — Progress Notes (Signed)
Subjective:    Patient ID: Donald Jones, male    DOB: Jul 15, 1945, 68 y.o.   MRN: [id number]  DOS:  01/27/2014 Type of visit - description : acute Interval history: One-day history of need for abdominal pain described as grabbing, pressure, decrease when he passes gas or has a bowel movement. Took gas-x OTC and feels better. A week ago he ran out of probiotic called Activia  ROS Denies fever or chills Appetite is normal No nausea, vomiting, heartburn. 2 weeks ago so few drops of red blood in the toilet paper after a bowel movement, he was mildly constipated. Otherwise no blood in the stools. No dysuria, gross hematuria or difficulty urinating. Admits to moderate stress.   Past Medical History  Diagnosis Date  . Sleep apnea     on Cpap  . Hypertension   . Obesity     100-lb weight loss since 2011.   . Kidney stones     several episodes.  Last one in 2011.    Marland Kitchen GERD (gastroesophageal reflux disease)   . Polycythemia     Past Surgical History  Procedure Laterality Date  . Cholecystectomy    . Hand surgery  2010    R hand , d/t a injury, 3 surgeries   . Colonoscopy  06-09-02    Dr.Santogade    History   Social History  . Marital Status: Widowed    Spouse Name: N/A    Number of Children: 3   . 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 Smoking status: Never Smoker   . Smokeless tobacco: Never Used  . Alcohol Use: No     Comment: none for 15 years  . Drug Use: No  . Sexual Activity: No   Other Topics Concern  . Not on file   Social History Narrative   1ST WIFE DIED FROM LUNG CANCER, 2ND WIFE DIED FROM RENAL CANCER.     Married x 4         Medication List       This list is accurate as of: 01/27/14  6:27 PM.  Always use your most recent med list.               acetaminophen 325 MG tablet  Commonly known as:  TYLENOL  Take 650 mg by mouth every 6 (six) hours as needed for pain.     ANTI-FUNGAL EX  Apply 1 application topically daily as needed (for foot fungus.).     aspirin 81 MG tablet  Take 81 mg by mouth daily.     B-12 500 MCG Tabs  Take 1 tablet by mouth daily.     dicyclomine 20 MG tablet  Commonly known as:  BENTYL  Take 1 tablet (20 mg total) by mouth 3 (three) times daily as needed.     Fish Oil 1000 MG Caps  Take by mouth.     hydrochlorothiazide 25 MG tablet  Commonly known as:  HYDRODIURIL  TAKE 1 TABLET BY MOUTH EVERY MORNING     multivitamin tablet  Take 1 tablet by mouth daily.     omeprazole 40 MG capsule  Commonly known as:  PRILOSEC  TAKE 1 CAPSULE BY MOUTH DAILY.     SINUS BUSTER NA  Place 1 puff into the nose daily as needed.           Objective:   Physical Exam BP 142/88  Pulse  60  Temp(Src) 98.7 F (37.1 C) (Oral)  Wt 225 lb 8 oz (102.286 kg)  SpO2 97% General -- alert, well-developed, NAD.  HEENT-- Not pale. No jaundice  Lungs -- normal respiratory effort, no intercostal retractions, no accessory muscle use, and normal breath sounds.  Heart-- normal rate, regular rhythm, no murmur.  Abdomen-- Not distended, good bowel sounds,soft, non-tender. No rebound or rigidity. No mass,organomegaly.  Extremities-- no pretibial edema bilaterally  Neurologic--  alert & oriented X3. Speech normal, gait appropriate for age, strength symmetric and appropriate for age.  Psych-- Cognition and judgment appear intact. Cooperative with normal attention span and concentration. No anxious or depressed appearing.     Assessment & Plan:

## 2014-02-17 ENCOUNTER — Ambulatory Visit (INDEPENDENT_AMBULATORY_CARE_PROVIDER_SITE_OTHER): Payer: BC Managed Care – PPO | Admitting: Internal Medicine

## 2014-02-17 ENCOUNTER — Encounter: Payer: Self-pay | Admitting: Internal Medicine

## 2014-02-17 VITALS — BP 165/86 | HR 62 | Temp 98.1°F | Wt 223.5 lb

## 2014-02-17 DIAGNOSIS — J069 Acute upper respiratory infection, unspecified: Secondary | ICD-10-CM

## 2014-02-17 MED ORDER — BENZONATATE 100 MG PO CAPS: 100.0000 mg | ORAL_CAPSULE | Freq: Three times a day (TID) | ORAL | Status: DC | PRN

## 2014-02-17 MED ORDER — AMOXICILLIN 500 MG PO CAPS: 1000.0000 mg | ORAL_CAPSULE | Freq: Two times a day (BID) | ORAL | Status: DC

## 2014-02-17 NOTE — Patient Instructions (Addendum)
Rest, fluids , tylenol  If  cough, take tessalon perles  Ok to try Mucinex DM again if you like to , watch for side effects   If nasal  congestion use OTC Nasocort: 2 nasal sprays on each side of the nose daily until you feel better  Take the antibiotic as prescribed  (Amoxicillin) if no better in 3-4 days   Call if not gradually better over the next  10 days  Call anytime if the symptoms are severe   Check the  blood pressure 2 or 3 times a   Week  Be sure your blood pressure is between  145/85  and 110/65.  if it is consistently higher or lower, let me know  <

## 2014-02-17 NOTE — Progress Notes (Signed)
Pre visit review using our clinic review tool, if applicable. No additional management support is needed unless otherwise documented below in the visit note. 

## 2014-02-17 NOTE — Progress Notes (Signed)
Subjective:    Patient ID: Donald Jones, male    DOB: 10-27-1945, 68 y.o.   MRN: [id number]  DOS:  02/17/2014 Type of visit - description : acute  Interval history: Symptoms started about 7 days ago: Cough, sore throat, chest congestion. Took Mucinex DM OTC and he felt his urinary flow was  slow, discontinued OTCs  yesterday and feels better today.   ROS Some ear/eye discomfort. No fever chills + Sinus pain and congestion without nasal discharge No nausea vomiting, had mild diarrhea this week. Occasional sputum production.  Past Medical History  Diagnosis Date  . Sleep apnea     on Cpap  . Hypertension   . Obesity     100-lb weight loss since 2011.   . Kidney stones     several episodes.  Last one in 2011.    Marland Kitchen GERD (gastroesophageal reflux disease)   . Polycythemia     Past Surgical History  Procedure Laterality Date  . Cholecystectomy    . Hand surgery  2010    R hand , d/t a injury, 3 surgeries   . Colonoscopy  06-09-02    Dr.Santogade    History   Social History  . Marital Status: Widowed    Spouse Name: N/A    Number of Children: 3   . 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 Smoking status: Never Smoker   . Smokeless tobacco: Never Used  . Alcohol Use: No     Comment: none for 15 years  . Drug Use: No  . Sexual Activity: No   Other Topics Concern  . Not on file   Social History Narrative   1ST WIFE DIED FROM LUNG CANCER, 2ND WIFE DIED FROM RENAL CANCER.     Married x 4         Medication List       This list is accurate as of: 02/17/14  6:10 PM.  Always use your most recent med list.               acetaminophen 325 MG tablet  Commonly known as:  TYLENOL  Take 650 mg by mouth every 6 (six) hours as needed for pain.     amoxicillin 500 MG capsule  Commonly known as:  AMOXIL  Take 2 capsules (1,000 mg total) by mouth 2 (two) times daily.     ANTI-FUNGAL EX  Apply 1  application topically daily as needed (for foot fungus.).     aspirin 81 MG tablet  Take 81 mg by mouth daily.     B-12 500 MCG Tabs  Take 1 tablet by mouth daily.     benzonatate 100 MG capsule  Commonly known as:  TESSALON  Take 1 capsule (100 mg total) by mouth 3 (three) times daily as needed for cough.     dicyclomine 20 MG tablet  Commonly known as:  BENTYL  Take 1 tablet (20 mg total) by mouth 3 (three) times daily as needed.     Fish Oil 1000 MG Caps  Take by mouth.     hydrochlorothiazide 25 MG tablet  Commonly known as:  HYDRODIURIL  TAKE 1 TABLET BY MOUTH EVERY MORNING     multivitamin tablet  Take 1 tablet by mouth daily.     omeprazole 40 MG capsule  Commonly known as:  PRILOSEC  TAKE 1 CAPSULE BY MOUTH DAILY.     SINUS  BUSTER NA  Place 1 puff into the nose daily as needed.           Objective:   Physical Exam BP 165/86 mmHg  Pulse 62  Temp(Src) 98.1 F (36.7 C) (Oral)  Wt 223 lb 8 oz (101.379 kg)  SpO2 96%  General -- alert, well-developed, NAD.  HEENT-- Not pale.  R Ear-- normal L ear-- normal Throat symmetric, no redness or discharge. Face symmetric, sinuses not tender to palpation. Nose slt congested. Lungs -- normal respiratory effort, no intercostal retractions, no accessory muscle use, and  Few ronchi.  Heart-- normal rate, regular rhythm, no murmur.  Extremities-- no pretibial edema bilaterally  Neurologic--  alert & oriented X3. Speech normal, gait appropriate for age, strength symmetric and appropriate for age.  Psych-- Cognition and judgment appear intact. Cooperative with normal attention span and concentration. No anxious or depressed appearing.       Assessment & Plan:    URI, See  instructions, apparently had some side effects with Mucinex DM, okay to try again cautiously.  BP today slightly elevated, recommend to check BPs at home.

## 2014-03-19 ENCOUNTER — Other Ambulatory Visit: Payer: Self-pay | Admitting: Internal Medicine

## 2014-06-30 ENCOUNTER — Other Ambulatory Visit: Payer: Self-pay | Admitting: Internal Medicine

## 2014-07-24 ENCOUNTER — Encounter: Payer: Self-pay | Admitting: Internal Medicine

## 2014-07-24 ENCOUNTER — Ambulatory Visit (INDEPENDENT_AMBULATORY_CARE_PROVIDER_SITE_OTHER): Payer: Managed Care, Other (non HMO) | Admitting: Internal Medicine

## 2014-07-24 VITALS — BP 134/80 | HR 55 | Temp 98.1°F | Resp 16 | Ht 65.0 in | Wt 226.0 lb

## 2014-07-24 DIAGNOSIS — H6981 Other specified disorders of Eustachian tube, right ear: Secondary | ICD-10-CM | POA: Diagnosis not present

## 2014-07-24 DIAGNOSIS — J3089 Other allergic rhinitis: Secondary | ICD-10-CM | POA: Diagnosis not present

## 2014-07-24 DIAGNOSIS — J029 Acute pharyngitis, unspecified: Secondary | ICD-10-CM | POA: Diagnosis not present

## 2014-07-24 MED ORDER — AMOXICILLIN 500 MG PO CAPS: 500.0000 mg | ORAL_CAPSULE | Freq: Three times a day (TID) | ORAL | Status: DC

## 2014-07-24 NOTE — Patient Instructions (Addendum)
Plain Mucinex (NOT D) for thick secretions ;force NON dairy fluids .   Nasal cleansing in the shower as discussed with lather of mild shampoo.After 10 seconds wash off lather while  exhaling through nostrils. Make sure that all residual soap is removed to prevent irritation.  Flonase OR Nasacort AQ 1 spray in each nostril twice a day as needed. Use the "crossover" technique into opposite nostril spraying toward opposite ear @ 45 degree angle, not straight up into nostril.  Plain Allegra (NOT D )  160 daily , Loratidine 10 mg , OR Zyrtec 10 mg @ bedtime  as needed for itchy eyes & sneezing.  Fill the  prescription for antibiotic if fever, discolored nasal or chest secretions or significant pain above & below eyes appear in the next 48-72 hours. 

## 2014-07-24 NOTE — Progress Notes (Signed)
   Subjective:    Patient ID: Donald Jones, male    DOB: 05-08-1945, 69 y.o.   MRN: [id number]  HPI Symptoms began 7-10 days ago as itchy eyes and rhinitis with clear drainage. As of last night he developed a scratchy throat. This morning he awoke with pressure discomfort in the right ear approximately @ 3 AM. He describes frontal and facial pressure without frank pain. He has clear postnasal drainage which persists. He has some anosmia and nasal obstruction.  He's been using Tylenol, OTC cough drops & Nasacort but only once a day.   Review of Systems    He denies nasal purulence, fever, chills, sweats. He has no cough, wheezing, shortness of breath.      Objective:   Physical Exam General appearance:Adequately nourished; no acute distress or increased work of breathing is present.  Pattern alopecia is present. He has a mustache and beard. There is increase hirsutism in otic canals and nares.  Lymphatic: No  lymphadenopathy about the head, neck, or axilla .  Eyes: No conjunctival inflammation or lid edema is present. There is no scleral icterus.  Ears:  External ear exam shows no significant lesions or deformities.  Otoscopic examination reveals clear canals, tympanic membranes are intact bilaterally without bulging, retraction, inflammation or discharge.The tympanic membranes are dull.  Nose:  External nasal examination shows no deformity or inflammation. There is marked erythema of the nasal mucosa especially left more than the right.  No septal dislocation or deviation.No obstruction to airflow.   Oral exam: Dental hygiene is good; lips and gums are healthy appearing.There is no oropharyngeal erythema or exudate .  Neck:  No deformities, thyromegaly, masses, or tenderness noted.   Supple with full range of motion without pain.   Heart:  Normal rate and regular rhythm. S1 and S2 normal without gallop, murmur, click, rub or other extra sounds.   Lungs:Chest clear to auscultation; no  wheezes, rhonchi,rales ,or rubs present.  Extremities:  No cyanosis, edema, or clubbing  noted    Skin: Warm & dry w/o tenting or jaundice. No significant lesions or rash.       Assessment & Plan:  #1 allergic rhinitis with sore throat; criteria for rhinosinusitis not present  #2 eustachian tube dysfunction, right  Plan: See orders and recommendations

## 2014-07-24 NOTE — Progress Notes (Signed)
Pre visit review using our clinic review tool, if applicable. No additional management support is needed unless otherwise documented below in the visit note. 

## 2014-09-09 ENCOUNTER — Other Ambulatory Visit: Payer: Self-pay | Admitting: Internal Medicine

## 2014-10-05 ENCOUNTER — Ambulatory Visit (INDEPENDENT_AMBULATORY_CARE_PROVIDER_SITE_OTHER): Payer: Managed Care, Other (non HMO) | Admitting: Internal Medicine

## 2014-10-05 ENCOUNTER — Encounter: Payer: Self-pay | Admitting: Internal Medicine

## 2014-10-05 VITALS — BP 124/64 | HR 58 | Temp 98.2°F | Ht 65.0 in | Wt 227.2 lb

## 2014-10-05 DIAGNOSIS — R7309 Other abnormal glucose: Secondary | ICD-10-CM

## 2014-10-05 DIAGNOSIS — D649 Anemia, unspecified: Secondary | ICD-10-CM

## 2014-10-05 DIAGNOSIS — E0590 Thyrotoxicosis, unspecified without thyrotoxic crisis or storm: Secondary | ICD-10-CM | POA: Insufficient documentation

## 2014-10-05 DIAGNOSIS — I10 Essential (primary) hypertension: Secondary | ICD-10-CM

## 2014-10-05 DIAGNOSIS — R7989 Other specified abnormal findings of blood chemistry: Secondary | ICD-10-CM

## 2014-10-05 DIAGNOSIS — E119 Type 2 diabetes mellitus without complications: Secondary | ICD-10-CM | POA: Insufficient documentation

## 2014-10-05 DIAGNOSIS — D751 Secondary polycythemia: Secondary | ICD-10-CM

## 2014-10-05 DIAGNOSIS — R7303 Prediabetes: Secondary | ICD-10-CM

## 2014-10-05 NOTE — Assessment & Plan Note (Signed)
Check an A1c today

## 2014-10-05 NOTE — Assessment & Plan Note (Signed)
BP seems to be well-controlled, on hydrochlorothiazide, check a BMP

## 2014-10-05 NOTE — Assessment & Plan Note (Signed)
TSH was suppressed at some point, normal free T3 free T4. Plan: Labs

## 2014-10-05 NOTE — Assessment & Plan Note (Addendum)
History of polycythemia, last seen by hematology 11-2013. See hematology comments below. He now presents with reportedly anemia in the context of frequent blood donations (q 2 months?), GI review systems negative, he is up-to-date on his colonoscopies. Plan: CBC, iron, ferritin, Z12 folic acid. Further advice w/ results,.  Hematology 11-2013: secondary erythrocytosis Erythropoietin level was normal. This is consistent with secondary erythrocytosis, likely due to under treated obstructive sleep apnea. I recommend to have his CBC rechecked periodically and keep the hemoglobin under 16 g. The patient will remain on aspirin indefinitely. I will order another unit of blood to be removed today. I recommend the patient go to blood bank to attempt blood donation every 3 months to keep his hemoglobin down. I have not make a return appointment for the patient to come back unless he gets refused from blood donation the patient will call me.

## 2014-10-05 NOTE — Progress Notes (Signed)
Pre visit review using our clinic review tool, if applicable. No additional management support is needed unless otherwise documented below in the visit note. 

## 2014-10-05 NOTE — Patient Instructions (Signed)
Go to the lab.

## 2014-10-05 NOTE — Progress Notes (Signed)
Subjective:    Patient ID: Donald Jones, male    DOB: 1945-06-24, 69 y.o.   MRN: [id number]  DOS:  10/05/2014 Type of visit - description : Acute Interval history: Last week went to donate blood and was told he has  anemia, reportedly hemoglobin was 12.4. Also, history of hypertension, good compliance of medication, no recent labs, due for a BMP Sleep apnea, good compliance with the CPAP every night History of abnormal TSH, due for labs    Review of Systems  No fever chills No nausea, vomiting, diarrhea blood in the stools. No abdominal pain. Not feeling sleepy, somewhat fatigue.  Past Medical History  Diagnosis Date  . Sleep apnea     on Cpap  . Hypertension   . Obesity     100-lb weight loss since 2011.   . Kidney stones     several episodes.  Last one in 2011.    Marland Kitchen GERD (gastroesophageal reflux disease)   . Polycythemia     Past Surgical History  Procedure Laterality Date  . Cholecystectomy    . Hand surgery  2010    R hand , d/t a injury, 3 surgeries   . Colonoscopy  06-09-02    Dr.Santogade    History   Social History  . Marital Status: Widowed    Spouse Name: N/A  . Number of Children: 3   . 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 Smoking status: Never Smoker   . Smokeless tobacco: Never Used  . Alcohol Use: No     Comment: none for 15 years  . Drug Use: No  . Sexual Activity: No   Other Topics Concern  . Not on file   Social History Narrative   1ST WIFE DIED FROM LUNG CANCER, 2ND WIFE DIED FROM RENAL CANCER.     Married x 4         Medication List       This list is accurate as of: 10/05/14 11:59 PM.  Always use your most recent med list.               acetaminophen 325 MG tablet  Commonly known as:  TYLENOL  Take 650 mg by mouth every 6 (six) hours as needed for pain.     ANTI-FUNGAL EX  Apply 1 application topically daily as needed (for foot fungus.).     aspirin 81  MG tablet  Take 81 mg by mouth daily.     B-12 500 MCG Tabs  Take 1 tablet by mouth daily.     dicyclomine 20 MG tablet  Commonly known as:  BENTYL  Take 1 tablet (20 mg total) by mouth 3 (three) times daily as needed for spasms.     Fish Oil 1000 MG Caps  Take by mouth.     Ginkgo 60 MG Tabs  Take 2 tablets by mouth daily.     hydrochlorothiazide 25 MG tablet  Commonly known as:  HYDRODIURIL  TAKE 1 TABLET BY MOUTH EVERY MORNING     multivitamin tablet  Take 1 tablet by mouth daily.     omeprazole 40 MG capsule  Commonly known as:  PRILOSEC  TAKE 1 CAPSULE BY MOUTH DAILY.     SINUS BUSTER NA  Place 1 puff into the nose daily as needed.           Objective:   Physical Exam BP 124/64 mmHg  Pulse 58  Temp(Src) 98.2 F (36.8 C) (Oral)  Ht 5\' 5"  (1.651 m)  Wt 227 lb 4 oz (103.08 kg)  BMI 37.82 kg/m2  SpO2 98%  General:   Well developed, well nourished . NAD.  HEENT:  Normocephalic . Face symmetric, atraumatic Neck: No thyromegaly Lungs:  CTA B Normal respiratory effort, no intercostal retractions, no accessory muscle use. Heart: RRR,  no murmur.  trace pretibial edema bilaterally  Abdomen,  Not distended, soft, nontender Skin: Not pale. Not jaundice Neurologic:  alert & oriented X3.  Speech normal, gait appropriate for age and unassisted Psych--  Cognition and judgment appear intact.  Cooperative with normal attention span and concentration.  Behavior appropriate. No anxious or depressed appearing.       Assessment & Plan:

## 2014-10-06 LAB — TSH: TSH: 0.51 u[IU]/mL (ref 0.35–4.50)

## 2014-10-06 LAB — CBC WITH DIFFERENTIAL/PLATELET
Basophils Absolute: 0.1 10*3/uL (ref 0.0–0.1)
Basophils Relative: 1.3 % (ref 0.0–3.0)
Eosinophils Absolute: 0.4 10*3/uL (ref 0.0–0.7)
Eosinophils Relative: 4.3 % (ref 0.0–5.0)
HEMATOCRIT: 38.8 % — AB (ref 39.0–52.0)
HEMOGLOBIN: 12 g/dL — AB (ref 13.0–17.0)
LYMPHS PCT: 30.8 % (ref 12.0–46.0)
Lymphs Abs: 2.6 10*3/uL (ref 0.7–4.0)
MCHC: 31 g/dL (ref 30.0–36.0)
MCV: 76.3 fl — ABNORMAL LOW (ref 78.0–100.0)
Monocytes Absolute: 0.5 10*3/uL (ref 0.1–1.0)
Monocytes Relative: 5.4 % (ref 3.0–12.0)
Neutro Abs: 5 10*3/uL (ref 1.4–7.7)
Neutrophils Relative %: 58.2 % (ref 43.0–77.0)
PLATELETS: 254 10*3/uL (ref 150.0–400.0)
RBC: 5.08 Mil/uL (ref 4.22–5.81)
RDW: 16.9 % — ABNORMAL HIGH (ref 11.5–15.5)
WBC: 8.6 10*3/uL (ref 4.0–10.5)

## 2014-10-06 LAB — BASIC METABOLIC PANEL
BUN: 22 mg/dL (ref 6–23)
CO2: 30 meq/L (ref 19–32)
CREATININE: 1.29 mg/dL (ref 0.40–1.50)
Calcium: 9.6 mg/dL (ref 8.4–10.5)
Chloride: 105 mEq/L (ref 96–112)
GFR: 59.07 mL/min — ABNORMAL LOW (ref >60.00)
GLUCOSE: 90 mg/dL (ref 70–99)
POTASSIUM: 4.5 meq/L (ref 3.5–5.1)
Sodium: 140 mEq/L (ref 135–145)

## 2014-10-06 LAB — IRON: IRON: 8 ug/dL — AB (ref 42–165)

## 2014-10-06 LAB — HEMOGLOBIN A1C: Hgb A1c MFr Bld: 6.5 % (ref 4.6–6.5)

## 2014-10-06 LAB — T4, FREE: FREE T4: 1.05 ng/dL (ref 0.60–1.60)

## 2014-10-06 LAB — FERRITIN: Ferritin: 6.2 ng/mL — ABNORMAL LOW (ref 22.0–322.0)

## 2014-10-06 LAB — VITAMIN B12: Vitamin B-12: 1175 pg/mL — ABNORMAL HIGH (ref 211–911)

## 2014-10-06 LAB — T3, FREE: T3 FREE: 3.5 pg/mL (ref 2.3–4.2)

## 2014-10-06 LAB — FOLATE

## 2014-10-08 NOTE — Addendum Note (Signed)
Addended by: Wilfrid Lund on: 10/08/2014 03:37 PM   Modules accepted: Orders

## 2014-10-12 ENCOUNTER — Other Ambulatory Visit: Payer: Self-pay

## 2014-10-14 ENCOUNTER — Other Ambulatory Visit: Payer: Self-pay | Admitting: Internal Medicine

## 2014-10-27 ENCOUNTER — Ambulatory Visit: Payer: Managed Care, Other (non HMO)

## 2014-10-27 ENCOUNTER — Other Ambulatory Visit (HOSPITAL_BASED_OUTPATIENT_CLINIC_OR_DEPARTMENT_OTHER): Payer: Managed Care, Other (non HMO)

## 2014-10-27 ENCOUNTER — Ambulatory Visit (HOSPITAL_BASED_OUTPATIENT_CLINIC_OR_DEPARTMENT_OTHER): Payer: Managed Care, Other (non HMO) | Admitting: Family

## 2014-10-27 ENCOUNTER — Other Ambulatory Visit: Payer: Self-pay | Admitting: Family

## 2014-10-27 ENCOUNTER — Encounter: Payer: Self-pay | Admitting: Family

## 2014-10-27 VITALS — BP 160/71 | HR 51 | Temp 97.9°F | Wt 227.0 lb

## 2014-10-27 DIAGNOSIS — D751 Secondary polycythemia: Secondary | ICD-10-CM | POA: Diagnosis not present

## 2014-10-27 DIAGNOSIS — D649 Anemia, unspecified: Secondary | ICD-10-CM

## 2014-10-27 LAB — CBC WITH DIFFERENTIAL (CANCER CENTER ONLY)
BASO#: 0 10*3/uL (ref 0.0–0.2)
BASO%: 0.4 % (ref 0.0–2.0)
EOS%: 4.4 % (ref 0.0–7.0)
Eosinophils Absolute: 0.3 10*3/uL (ref 0.0–0.5)
HCT: 38.9 % (ref 38.7–49.9)
HEMOGLOBIN: 12.3 g/dL — AB (ref 13.0–17.1)
LYMPH#: 2.8 10*3/uL (ref 0.9–3.3)
LYMPH%: 40.8 % (ref 14.0–48.0)
MCH: 23.9 pg — ABNORMAL LOW (ref 28.0–33.4)
MCHC: 31.6 g/dL — ABNORMAL LOW (ref 32.0–35.9)
MCV: 76 fL — AB (ref 82–98)
MONO#: 0.6 10*3/uL (ref 0.1–0.9)
MONO%: 8.4 % (ref 0.0–13.0)
NEUT#: 3.2 10*3/uL (ref 1.5–6.5)
NEUT%: 46 % (ref 40.0–80.0)
Platelets: 256 10*3/uL (ref 145–400)
RBC: 5.14 10*6/uL (ref 4.20–5.70)
RDW: 16.4 % — ABNORMAL HIGH (ref 11.1–15.7)
WBC: 6.9 10*3/uL (ref 4.0–10.0)

## 2014-10-27 LAB — CHCC SATELLITE - SMEAR

## 2014-10-27 NOTE — Progress Notes (Signed)
Hematology/Oncology Consultation   Name: Pinchos Topel      MRN: [id number]    Location: Room/bed info not found  Date: 10/27/2014 Time:11:49 AM   REFERRING PHYSICIAN: Colon Branch, MD  REASON FOR CONSULT: Anemia   DIAGNOSIS: Secondary Polycythemia   HISTORY OF PRESENT ILLNESS: Mr. Savage is a very pleasant 69 yo male with a history of polycythemia secondary to obstructive sleep apnea and diuretics. He has been going the [facility] and donating blood for the last 3 years. He recently went to donate blood and they told him his hgb was too low at 12.5. Males must have a Hgb pf 13.0 or greater. He was found to be iron deficient as well.  He now uses his CPAP machine at night. He is asymptomatic at this time and has no complaints.  His JAK2 assay in November 2013 was negative.  He has no family history of elevated Hgb or anemia.  He had a sister that passed away from cirrhosis due to ETOH abuse. No other history of liver problems.  His last colonoscopy was in 2014 and was negative. He had a sister diagnosed with colon cancer. With her history, he has a colonoscopy every 6 years. There have been no changes in his bowel or bladder habits. No blood in his urine or stool. He does have IBS and fluctuates between constipation and diarrhea. He takes Bentyl daily and Prilosec.  He has had no episodes of bleeding or bruising.   He stay active going to the gym 3 days a week. He works in Biomedical scientist full time. He has had some SOB and fatigue when working out in the heat and humidity but states that it passes quickly.  He has seasonal allergies and takes claritin.  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 He was knocked off a platform by a forklift and sustained some nerve damage in his lower back. As a result, he has some foot drop in the right foot.  He dose not smoke or drink alcohol.  He has a healthy appetite and makes sure to stay well hydrated. No  significant weight loss or gain.   ROS: All other 10 point review of systems is negative.   PAST MEDICAL HISTORY:   Past Medical History  Diagnosis Date  . Sleep apnea     on Cpap  . Hypertension   . Obesity     100-lb weight loss since 2011.   . Kidney stones     several episodes.  Last one in 2011.    Marland Kitchen GERD (gastroesophageal reflux disease)   . Polycythemia     ALLERGIES: Allergies  Allergen Reactions  . Sulfa Antibiotics Nausea And Vomiting      MEDICATIONS:  Current Outpatient Prescriptions on File Prior to Visit  Medication Sig Dispense Refill  . acetaminophen (TYLENOL) 325 MG tablet Take 650 mg by mouth every 6 (six) hours as needed for pain.     Marland Kitchen aspirin 81 MG tablet Take 81 mg by mouth daily.    . Cyanocobalamin (B-12) 500 MCG TABS Take 1 tablet by mouth daily.    Marland Kitchen dicyclomine (BENTYL) 20 MG tablet Take 1 tablet (20 mg total) by mouth 3 (three) times daily as needed for spasms. 180 tablet 0  . Ginkgo 60 MG TABS Take 2 tablets by mouth daily.    . Homeopathic Products (SINUS BUSTER NA) Place 1 puff into the  nose daily as needed.    . hydrochlorothiazide (HYDRODIURIL) 25 MG tablet TAKE 1 TABLET BY MOUTH EVERY MORNING 90 tablet 1  . Multiple Vitamin (MULTIVITAMIN) tablet Take 1 tablet by mouth daily.     . Omega-3 Fatty Acids (FISH OIL) 1000 MG CAPS Take by mouth.    Marland Kitchen omeprazole (PRILOSEC) 40 MG capsule Take 1 capsule (40 mg total) by mouth daily. 90 capsule 3  . Tolnaftate (ANTI-FUNGAL EX) Apply 1 application topically daily as needed (for foot fungus.).     Current Facility-Administered Medications on File Prior to Visit  Medication Dose Route Frequency Provider Last Rate Last Dose  . nitroGLYCERIN (NITROSTAT) SL tablet 0.4 mg  0.4 mg Sublingual Once Darreld Mclean, MD         PAST SURGICAL HISTORY Past Surgical History  Procedure Laterality Date  . Cholecystectomy    . Hand surgery  2010    R hand , d/t a injury, 3 surgeries   . Colonoscopy  06-09-02     Dr.Santogade    FAMILY HISTORY: Family History  Problem Relation Age of Onset  . Diabetes Mother   . Coronary artery disease Mother   . Colon cancer Sister 43    age ~ 4  . Prostate cancer Neg Hx   . Stroke Father     M and F  . Heart disease Sister     ?  Marland Kitchen Alcohol abuse Sister   . Hypertension Father     SOCIAL HISTORY:  reports that he has never smoked. He has never used smokeless tobacco. He reports that he does not drink alcohol or use illicit drugs.  PERFORMANCE STATUS: The patient's performance status is 0 - Asymptomatic  PHYSICAL EXAM: Most Recent Vital Signs: Blood pressure 160/71, pulse 51, temperature 97.9 F (36.6 C), temperature source Oral, weight 227 lb (102.967 kg). BP 160/71 mmHg  Pulse 51  Temp(Src) 97.9 F (36.6 C) (Oral)  Wt 227 lb (102.967 kg)  General Appearance:    Alert, cooperative, no distress, appears stated age  Head:    Normocephalic, without obvious abnormality, atraumatic  Eyes:    PERRL, conjunctiva/corneas clear, EOM's intact, fundi    benign, both eyes             Throat:   Lips, mucosa, and tongue normal; teeth and gums normal  Neck:   Supple, symmetrical, trachea midline, no adenopathy;       thyroid:  No enlargement/tenderness/nodules; no carotid   bruit or JVD  Back:     Symmetric, no curvature, ROM normal, no CVA tenderness  Lungs:     Clear to auscultation bilaterally, respirations unlabored  Chest wall:    No tenderness or deformity  Heart:    Regular rate and rhythm, S1 and S2 normal, no murmur, rub   or gallop  Abdomen:     Soft, non-tender, bowel sounds active all four quadrants,    no masses, no organomegaly        Extremities:   Extremities normal, atraumatic, no cyanosis or edema  Pulses:   2+ and symmetric all extremities  Skin:   Skin color, texture, turgor normal, no rashes or lesions  Lymph nodes:   Cervical, supraclavicular, and axillary nodes normal  Neurologic:   CNII-XII intact. Normal strength, sensation  and reflexes      throughout   LABORATORY DATA:  Results for orders placed or performed in visit on 10/27/14 (from the past 48 hour(s))  CBC with Differential [facility])  Status: Abnormal   Collection Time: 10/27/14 11:25 AM  Result Value Ref Range   WBC 6.9 4.0 - 10.0 10e3/uL   RBC 5.14 4.20 - 5.70 10e6/uL   HGB 12.3 (L) 13.0 - 17.1 g/dL   HCT 38.9 38.7 - 49.9 %   MCV 76 (L) 82 - 98 fL   MCH 23.9 (L) 28.0 - 33.4 pg   MCHC 31.6 (L) 32.0 - 35.9 g/dL   RDW 16.4 (H) 11.1 - 15.7 %   Platelets 256 145 - 400 10e3/uL   NEUT# 3.2 1.5 - 6.5 10e3/uL   LYMPH# 2.8 0.9 - 3.3 10e3/uL   MONO# 0.6 0.1 - 0.9 10e3/uL   Eosinophils Absolute 0.3 0.0 - 0.5 10e3/uL   BASO# 0.0 0.0 - 0.2 10e3/uL   NEUT% 46.0 40.0 - 80.0 %   LYMPH% 40.8 14.0 - 48.0 %   MONO% 8.4 0.0 - 13.0 %   EOS% 4.4 0.0 - 7.0 %   BASO% 0.4 0.0 - 2.0 %      RADIOGRAPHY: No results found.     PATHOLOGY: None  ASSESSMENT/PLAN: Mr. Machuca is a very pleasant 69 yo male with a history of polycythemia secondary to obstructive sleep apnea and diuretics. He has been going the red cross and donating blood for the last 3 years but was recently denies due to his Hgb being below their new parameter. He is asymptomatic and has no complaints at this time.  Now that he is using his CPAP each night he has noticed a major change. This has helped correct his polycythemia for the most part. He can still donate every 2-4 months when his Hgb meets the [facility] guidelines. This will help keep him iron deficient.  Dr. Marin Olp did view his blood smear and did not identify any abnormality or evidence of malignancy. All questions were answered. We do not need to follow-up with him at this time. He is welcome to contact us with any hematologic questions or concerns in the future. We would certainly be glad to see him again.   He was discussed with and also seen by Dr. Marin Olp and he is in agreement with the aforementioned.   [facility]     Addendum:   I saw and examined the patient with Ezzard Ditmer. We look at his blood smear. He had  A normochromic and normocytic population of red blood cells. There were no nucleated red cells. There were no teardrop cells. She had no target cells. There were no inclusion bodies. White cells were normalin morphology maturation. I saw no hypersegmented polys. There were no immature myeloid cells. There were no atypical lymphocytes. Platelets were adequate in number and size.  I would have to think that he has  Anemia from iron deficiency because of blood donations.  I really don't see anything that we need to do regarding this. He is asymptomatic.  He certainly could have secondary polycythemia from sleep apnea. His iron studies showed a ferritin of 12 and iron saturation of 8%.  His vitamin B-12 was 1466. his reticulocyte count was normal. If he wants to keep donating blood to the [facility] , he certainly can.   I don't see any evidence of a bone marrow disorder. He had his JAK2 assay done 3 years ago and this was normal.  This does not need to be repeated.  I don't think we have to see him back in the office.  It was nice talking to him about his life up  in [place].   We spent about 45 minutes with him.   Laurey Arrow

## 2014-10-28 LAB — IRON AND TIBC CHCC
%SAT: 8 % — ABNORMAL LOW (ref 20–55)
Iron: 29 ug/dL — ABNORMAL LOW (ref 42–163)
TIBC: 351 ug/dL (ref 202–409)
UIBC: 323 ug/dL (ref 117–376)

## 2014-10-28 LAB — FERRITIN CHCC: Ferritin: 12 ng/ml — ABNORMAL LOW (ref 22–316)

## 2014-10-29 LAB — LACTATE DEHYDROGENASE: LDH: 171 U/L (ref 94–250)

## 2014-10-29 LAB — COMPREHENSIVE METABOLIC PANEL
ALK PHOS: 61 U/L (ref 39–117)
ALT: 23 U/L (ref 0–53)
AST: 19 U/L (ref 0–37)
Albumin: 4.2 g/dL (ref 3.5–5.2)
BILIRUBIN TOTAL: 1.2 mg/dL (ref 0.2–1.2)
BUN: 20 mg/dL (ref 6–23)
CALCIUM: 9.4 mg/dL (ref 8.4–10.5)
CO2: 27 meq/L (ref 19–32)
Chloride: 102 mEq/L (ref 96–112)
Creatinine, Ser: 1.22 mg/dL (ref 0.50–1.35)
GLUCOSE: 106 mg/dL — AB (ref 70–99)
Potassium: 4.3 mEq/L (ref 3.5–5.3)
Sodium: 141 mEq/L (ref 135–145)
TOTAL PROTEIN: 6.8 g/dL (ref 6.0–8.3)

## 2014-10-29 LAB — VITAMIN B12: Vitamin B-12: 1466 pg/mL — ABNORMAL HIGH (ref 211–911)

## 2014-10-29 LAB — RETICULOCYTES (CHCC)
ABS Retic: 78.3 10*3/uL (ref 19.0–186.0)
RBC.: 5.22 MIL/uL (ref 4.22–5.81)
RETIC CT PCT: 1.5 % (ref 0.4–2.3)

## 2014-10-29 LAB — ERYTHROPOIETIN: Erythropoietin: 42.9 m[IU]/mL — ABNORMAL HIGH (ref 2.6–18.5)

## 2014-12-02 ENCOUNTER — Ambulatory Visit: Payer: Medicare Other | Admitting: Pulmonary Disease

## 2014-12-12 ENCOUNTER — Other Ambulatory Visit: Payer: Self-pay | Admitting: Internal Medicine

## 2014-12-14 NOTE — Telephone Encounter (Signed)
Rx sent 

## 2014-12-14 NOTE — Telephone Encounter (Signed)
Ok 180, no rf

## 2014-12-14 NOTE — Telephone Encounter (Signed)
Pt is requesting refill on Bentyl.  Last OV: 10/05/2014 Last Fill: 09/10/2014 #[id number] 0RF  Okay to refill?

## 2015-01-03 ENCOUNTER — Ambulatory Visit (INDEPENDENT_AMBULATORY_CARE_PROVIDER_SITE_OTHER): Payer: Managed Care, Other (non HMO) | Admitting: Physician Assistant

## 2015-01-03 VITALS — BP 132/70 | HR 67 | Temp 98.4°F | Resp 16 | Ht 65.0 in | Wt 228.0 lb

## 2015-01-03 DIAGNOSIS — T63444A Toxic effect of venom of bees, undetermined, initial encounter: Secondary | ICD-10-CM

## 2015-01-03 MED ORDER — CLOBETASOL PROPIONATE 0.05 % EX OINT: 1.0000 | TOPICAL_OINTMENT | Freq: Two times a day (BID) | CUTANEOUS | Status: DC

## 2015-01-03 NOTE — Patient Instructions (Signed)
Applying cold compresses over the areas will help. Applying the steroid topical 1-2 times daily over irritated areas until the itching subsides will help.  Taking ibuprofen every 6 hours will help. Taking an antihistamine like zyrtec daily for the next week will help.

## 2015-01-03 NOTE — Progress Notes (Signed)
   Subjective:    Patient ID: Donald Jones, male    DOB: 13-Apr-1946, 69 y.o.   MRN: [id number]  Chief Complaint  Patient presents with  . Insect Bite    Stung by Yellow Jacket yesterday    Medications, allergies, past medical history, surgical history, family history, social history and problem list reviewed and updated.  HPI  69 yom presents after wasp sting.   Sustained several wasp stings yest afternoon to arms, back, chest, abdomen. Itching and swelling has persisted over areas so presents today. Denies cp, sob, trouble swallowing, lip/tongue swelling. Has not applied anything over the stings.   Review of Systems No fevers, chills.     Objective:   Physical Exam  Constitutional: He appears well-developed and well-nourished.  Non-toxic appearance. He does not have a sickly appearance. He does not appear ill. No distress.  BP 132/70 mmHg  Pulse 67  Temp(Src) 98.4 F (36.9 C) (Oral)  Resp 16  Ht 5\' 5"  (1.651 m)  Wt 228 lb (103.42 kg)  BMI 37.94 kg/m2  SpO2 97%   HENT:  Mouth/Throat: Uvula is midline, oropharynx is clear and moist and mucous membranes are normal.  Pulmonary/Chest: Effort normal and breath sounds normal.  Skin:  Multiple urticarial areas bilateral arms, right flank, abdomen with swelling, redness. Each approx 0.5-1cm in size.       Assessment & Plan:   Bee sting reaction, undetermined intent, initial encounter - Plan: clobetasol ointment (TEMOVATE) 0.05 %, DISCONTINUED: clobetasol ointment (TEMOVATE) 0.05 % --topical steroids, nsaids, cold compresses, anti histamines --no oral steroids as last a1c 6.5 --no signs angioedema  Julieta Gutting, PA-C Physician Assistant-Certified [facility]  01/03/2015 4:04 PM

## 2015-01-04 ENCOUNTER — Other Ambulatory Visit: Payer: Self-pay | Admitting: Internal Medicine

## 2015-01-21 ENCOUNTER — Telehealth: Payer: Self-pay | Admitting: Behavioral Health

## 2015-01-21 NOTE — Telephone Encounter (Signed)
Unable to reach patient at time of Pre-Visit Call.  Left message for patient to return call when available.    

## 2015-01-22 ENCOUNTER — Encounter: Payer: Self-pay | Admitting: Internal Medicine

## 2015-01-22 ENCOUNTER — Ambulatory Visit (INDEPENDENT_AMBULATORY_CARE_PROVIDER_SITE_OTHER): Payer: Managed Care, Other (non HMO) | Admitting: Internal Medicine

## 2015-01-22 VITALS — BP 130/78 | HR 57 | Temp 97.8°F | Ht 65.0 in | Wt 226.5 lb

## 2015-01-22 DIAGNOSIS — Z0000 Encounter for general adult medical examination without abnormal findings: Secondary | ICD-10-CM | POA: Diagnosis not present

## 2015-01-22 DIAGNOSIS — Z23 Encounter for immunization: Secondary | ICD-10-CM | POA: Diagnosis not present

## 2015-01-22 DIAGNOSIS — E119 Type 2 diabetes mellitus without complications: Secondary | ICD-10-CM

## 2015-01-22 DIAGNOSIS — Z1159 Encounter for screening for other viral diseases: Secondary | ICD-10-CM

## 2015-01-22 DIAGNOSIS — Z09 Encounter for follow-up examination after completed treatment for conditions other than malignant neoplasm: Secondary | ICD-10-CM

## 2015-01-22 DIAGNOSIS — D751 Secondary polycythemia: Secondary | ICD-10-CM

## 2015-01-22 LAB — CBC WITH DIFFERENTIAL/PLATELET
BASOS ABS: 0.1 10*3/uL (ref 0.0–0.1)
Basophils Relative: 1 % (ref 0–1)
Eosinophils Absolute: 0.2 10*3/uL (ref 0.0–0.7)
Eosinophils Relative: 3 % (ref 0–5)
HEMATOCRIT: 37.2 % — AB (ref 39.0–52.0)
HEMOGLOBIN: 11.1 g/dL — AB (ref 13.0–17.0)
LYMPHS ABS: 2.9 10*3/uL (ref 0.7–4.0)
LYMPHS PCT: 38 % (ref 12–46)
MCH: 22.1 pg — ABNORMAL LOW (ref 26.0–34.0)
MCHC: 29.8 g/dL — ABNORMAL LOW (ref 30.0–36.0)
MCV: 74 fL — AB (ref 78.0–100.0)
MPV: 9.2 fL (ref 8.6–12.4)
Monocytes Absolute: 0.7 10*3/uL (ref 0.1–1.0)
Monocytes Relative: 9 % (ref 3–12)
NEUTROS ABS: 3.8 10*3/uL (ref 1.7–7.7)
NEUTROS PCT: 49 % (ref 43–77)
Platelets: 289 10*3/uL (ref 150–400)
RBC: 5.03 MIL/uL (ref 4.22–5.81)
RDW: 16.5 % — ABNORMAL HIGH (ref 11.5–15.5)
WBC: 7.7 10*3/uL (ref 4.0–10.5)

## 2015-01-22 LAB — LIPID PANEL
CHOL/HDL RATIO: 2.9 ratio (ref <=5.0)
CHOLESTEROL: 148 mg/dL (ref 125–200)
HDL: 51 mg/dL (ref >=40)
LDL Cholesterol: 89 mg/dL (ref <130)
Triglycerides: 42 mg/dL (ref <150)
VLDL: 8 mg/dL (ref <30)

## 2015-01-22 LAB — HEMOGLOBIN A1C
Hgb A1c MFr Bld: 6.6 % — ABNORMAL HIGH (ref <5.7)
MEAN PLASMA GLUCOSE: 143 mg/dL — AB (ref <117)

## 2015-01-22 LAB — IRON: IRON: 16 ug/dL — AB (ref 50–180)

## 2015-01-22 NOTE — Progress Notes (Signed)
Pre visit review using our clinic review tool, if applicable. No additional management support is needed unless otherwise documented below in the visit note. 

## 2015-01-22 NOTE — Patient Instructions (Signed)
Get your blood work before you leave    Please consider visit these websites for more information:  www.begintheconversation.org  theconversationproject.org  Next visit in 6 months, fasting.   Fall Prevention and Home Safety Falls cause injuries and can affect all age groups. It is possible to use preventive measures to significantly decrease the likelihood of falls. There are many simple measures which can make your home safer and prevent falls. OUTDOORS  Repair cracks and edges of walkways and driveways.  Remove high doorway thresholds.  Trim shrubbery on the main path into your home.  Have good outside lighting.  Clear walkways of tools, rocks, debris, and clutter.  Check that handrails are not broken and are securely fastened. Both sides of steps should have handrails.  Have leaves, snow, and ice cleared regularly.  Use sand or salt on walkways during winter months.  In the garage, clean up grease or oil spills. BATHROOM  Install night lights.  Install grab bars by the toilet and in the tub and shower.  Use non-skid mats or decals in the tub or shower.  Place a plastic non-slip stool in the shower to sit on, if needed.  Keep floors dry and clean up all water on the floor immediately.  Remove soap buildup in the tub or shower on a regular basis.  Secure bath mats with non-slip, double-sided rug tape.  Remove throw rugs and tripping hazards from the floors. BEDROOMS  Install night lights.  Make sure a bedside light is easy to reach.  Do not use oversized bedding.  Keep a telephone by your bedside.  Have a firm chair with side arms to use for getting dressed.  Remove throw rugs and tripping hazards from the floor. KITCHEN  Keep handles on pots and pans turned toward the center of the stove. Use back burners when possible.  Clean up spills quickly and allow time for drying.  Avoid walking on wet floors.  Avoid hot utensils and knives.  Position  shelves so they are not too high or low.  Place commonly used objects within easy reach.  If necessary, use a sturdy step stool with a grab bar when reaching.  Keep electrical cables out of the way.  Do not use floor polish or wax that makes floors slippery. If you must use wax, use non-skid floor wax.  Remove throw rugs and tripping hazards from the floor. STAIRWAYS  Never leave objects on stairs.  Place handrails on both sides of stairways and use them. Fix any loose handrails. Make sure handrails on both sides of the stairways are as long as the stairs.  Check carpeting to make sure it is firmly attached along stairs. Make repairs to worn or loose carpet promptly.  Avoid placing throw rugs at the top or bottom of stairways, or properly secure the rug with carpet tape to prevent slippage. Get rid of throw rugs, if possible.  Have an electrician put in a light switch at the top and bottom of the stairs. OTHER FALL PREVENTION TIPS  Wear low-heel or rubber-soled shoes that are supportive and fit well. Wear closed toe shoes.  When using a stepladder, make sure it is fully opened and both spreaders are firmly locked. Do not climb a closed stepladder.  Add color or contrast paint or tape to grab bars and handrails in your home. Place contrasting color strips on first and last steps.  Learn and use mobility aids as needed. Install an electrical emergency response system.  Turn  on lights to avoid dark areas. Replace light bulbs that burn out immediately. Get light switches that glow.  Arrange furniture to create clear pathways. Keep furniture in the same place.  Firmly attach carpet with non-skid or double-sided tape.  Eliminate uneven floor surfaces.  Select a carpet pattern that does not visually hide the edge of steps.  Be aware of all pets. OTHER HOME SAFETY TIPS  Set the water temperature for 120 F (48.8 C).  Keep emergency numbers on or near the telephone.  Keep  smoke detectors on every level of the home and near sleeping areas. Document Released: 03/24/2002 Document Revised: 10/03/2011 Document Reviewed: 06/23/2011 [facility] Patient Information 2015 [place], [place]. This information is not intended to replace advice given to you by your health care provider. Make sure you discuss any questions you have with your health care provider.   Preventive Care for Adults Ages 39 and over  Blood pressure check.** / Every 1 to 2 years.  Lipid and cholesterol check.**/ Every 5 years beginning at age 60.  Lung cancer screening. / Every year if you are aged 57-80 years and have a 30-pack-year history of smoking and currently smoke or have quit within the past 15 years. Yearly screening is stopped once you have quit smoking for at least 15 years or develop a health problem that would prevent you from having lung cancer treatment.  Fecal occult blood test (FOBT) of stool. / Every year beginning at age 70 and continuing until age 94. You may not have to do this test if you get a colonoscopy every 10 years.  Flexible sigmoidoscopy** or colonoscopy.** / Every 5 years for a flexible sigmoidoscopy or every 10 years for a colonoscopy beginning at age 78 and continuing until age 54.  Hepatitis C blood test.** / For all people born from [birth year] through 1963 and any individual with known risks for hepatitis C.  Abdominal aortic aneurysm (AAA) screening.** / A one-time screening for ages 68 to 31 years who are current or former smokers.  Skin self-exam. / Monthly.  Influenza vaccine. / Every year.  Tetanus, diphtheria, and acellular pertussis (Tdap/Td) vaccine.** / 1 dose of Td every 10 years.  Varicella vaccine.** / Consult your health care provider.  Zoster vaccine.** / 1 dose for adults aged 61 years or older.  Pneumococcal 13-valent conjugate (PCV13) vaccine.** / Consult your health care provider.  Pneumococcal polysaccharide (PPSV23) vaccine.** / 1 dose for all  adults aged 16 years and older.  Meningococcal vaccine.** / Consult your health care provider.  Hepatitis A vaccine.** / Consult your health care provider.  Hepatitis B vaccine.** / Consult your health care provider.  Haemophilus influenzae type b (Hib) vaccine.** / Consult your health care provider. **Family history and personal history of risk and conditions may change your health care provider's recommendations. Document Released: 05/30/2001 Document Revised: 04/08/2013 Document Reviewed: 08/29/2010 [facility] Patient Information 2015 [place], [place]. This information is not intended to replace advice given to you by your health care provider. Make sure you discuss any questions you have with your health care provider.

## 2015-01-22 NOTE — Progress Notes (Signed)
Subjective:    Patient ID: Donald Jones, male    DOB: 1945-12-11, 69 y.o.   MRN: [id number]  DOS:  01/22/2015 Type of visit - description :  Here for Medicare AWV:  1. Risk factors based on Past M, S, F history: reviewed 2. Physical Activities:  Gym x 3/week  3. Depression/mood: neg screening  4. Hearing:  No problems noted or reported  5. ADL's: independent, drives  6. Fall Risk: no recent falls, prevention discussed , see AVS 7. home Safety: does feel safe at home  8. Height, weight, & visual acuity: see VS, due to see the eye doctor, offered referral  9. Counseling: provided 10. Labs ordered based on risk factors: if needed  11. Referral Coordination: if needed 12. Care Plan, see assessment and plan , written personalized plan provided , see AVS 13. Cognitive Assessment: motor skills and cognition appropriate for age 62. Care team updated  15. End-of-life care discussed , see AVS  In addition, today we discussed the following:  HTN: good compliance with meds, reports normal ambulatory BPs    sleep apnea: on  CPAP     Review of Systems Constitutional: No fever. No chills. No unexplained wt changes. No unusual sweats  HEENT: No dental problems, no ear discharge, no facial swelling, no voice changes. No eye discharge, no eye  redness , no  intolerance to light   Respiratory: No wheezing , no  difficulty breathing. No cough , no mucus production  Cardiovascular: No CP, occasionally peri-ankle edema at the end of the day. No palpitations  GI: no nausea, no vomiting, no diarrhea , no  abdominal pain.  No blood in the stools. No dysphagia, no odynophagia    Endocrine: No polyphagia, no polyuria , no polydipsia  GU: No dysuria, gross hematuria, difficulty urinating. No urinary urgency, no frequency.  Musculoskeletal: No joint swellings or unusual aches or pains  Skin: No change in the color of the skin, palor , no  Rash  Allergic, immunologic: No environmental  allergies , no  food allergies  Neurological: No dizziness no  syncope. No headaches. No diplopia, no slurred, no slurred speech, no motor deficits, no facial  Numbness  Hematological: No enlarged lymph nodes, no easy bruising , no unusual bleedings  Psychiatry: No suicidal ideas, no hallucinations, no beavior problems, no confusion.  No unusual/severe anxiety, no depression    Past Medical History  Diagnosis Date  . Sleep apnea     on Cpap  . Hypertension   . Obesity     100-lb weight loss since 2011.   . Kidney stones     several episodes.  Last one in 2011.    Marland Kitchen GERD (gastroesophageal reflux disease)   . Polycythemia     Past Surgical History  Procedure Laterality Date  . Cholecystectomy    . Hand surgery  2010    R hand , d/t a injury, 3 surgeries   . Colonoscopy  06-09-02    Dr.Santogade    Social History   Social History  . Marital Status: Widowed    Spouse Name: N/A  . Number of Children: 3   . 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 Smoking status: Never Smoker   . Smokeless tobacco: Never Used  . Alcohol Use: No     Comment: none for 15 years  . Drug Use: No  . Sexual Activity: No  Other Topics Concern  . Not on file   Social History Narrative   1ST WIFE DIED FROM LUNG CANCER, 2ND WIFE DIED FROM RENAL CANCER.     Married x 4 , lives w/ wife     Family History  Problem Relation Age of Onset  . Diabetes Mother   . Coronary artery disease Mother   . Colon cancer Sister 66    age ~ 10  . Prostate cancer Neg Hx   . Stroke Father     M and F  . Heart disease Sister     ?  Marland Kitchen Alcohol abuse Sister   . Hypertension Father        Medication List       This list is accurate as of: 01/22/15 11:59 PM.  Always use your most recent med list.               acetaminophen 325 MG tablet  Commonly known as:  TYLENOL  Take 650 mg by mouth every 6 (six) hours as needed for pain.      ANTI-FUNGAL EX  Apply 1 application topically daily as needed (for foot fungus.).     aspirin 81 MG tablet  Take 81 mg by mouth daily.     B-12 500 MCG Tabs  Take 1 tablet by mouth daily.     clobetasol ointment 0.05 %  Commonly known as:  TEMOVATE  Apply 1 application topically 2 (two) times daily.     dicyclomine 20 MG tablet  Commonly known as:  BENTYL  Take 1 tablet (20 mg total) by mouth 3 (three) times daily as needed for spasms.     Fish Oil 1000 MG Caps  Take by mouth.     Ginkgo 60 MG Tabs  Take 2 tablets by mouth daily.     hydrochlorothiazide 25 MG tablet  Commonly known as:  HYDRODIURIL  Take 1 tablet (25 mg total) by mouth every morning.     multivitamin tablet  Take 1 tablet by mouth daily.     omeprazole 40 MG capsule  Commonly known as:  PRILOSEC  Take 1 capsule (40 mg total) by mouth daily.     SINUS BUSTER NA  Place 1 puff into the nose daily as needed.           Objective:   Physical Exam BP 130/78 mmHg  Pulse 57  Temp(Src) 97.8 F (36.6 C) (Oral)  Ht 5\' 5"  (1.651 m)  Wt 226 lb 8 oz (102.74 kg)  BMI 37.69 kg/m2  SpO2 98% General:   Well developed, well nourished . NAD.  Neck:  Full range of motion. Supple. No  thyromegaly HEENT:  Normocephalic . Face symmetric, atraumatic Lungs:  CTA B Normal respiratory effort, no intercostal retractions, no accessory muscle use. Heart: RRR,  no murmur.  No pretibial edema bilaterally  Abdomen:  Not distended, soft, non-tender. No rebound or rigidity. Rectal:  External abnormalities: none. Normal sphincter tone. No rectal masses or tenderness.  No stools Prostate: Prostate gland firm and smooth, no enlargement, nodularity, tenderness, mass, asymmetry or induration.  Skin: Exposed areas without rash. Not pale. Not jaundice Neurologic:  alert & oriented X3.  Speech normal, gait appropriate for age and unassisted Strength symmetric and appropriate for age.  Psych: Cognition and judgment  appear intact.  Cooperative with normal attention span and concentration.  Behavior appropriate. No anxious or depressed appearing.    Assessment & Plan:    Assessment  > DM  HTN Polycythemia OSA on CPAP GERD IBS? Urolithiasis, several episodes Foot drop, right  Plan  DM: Diet and exercise discussed. Recommend an eye check regularly HTN: Seems well-controlled, check a BMP Polycythemia: Recently seen by hematology, they recommend him to continue with donations if Hg is ok. Will check a CBC, iron and ferritin. OSA On CPAP RTC 6 months

## 2015-01-22 NOTE — Assessment & Plan Note (Addendum)
Td 2014, pneumonia shot 2004; prevnar: 01-2015; Shingles shot 2011 flu shot today  CCS: cscope ~ 2008 in [place],  [place] again 05-2012, Dr Ardis Hughs, neg, next 2019 due to [place] DRE wnl, PSA today Counseled: diet, exercise

## 2015-01-23 DIAGNOSIS — Z09 Encounter for follow-up examination after completed treatment for conditions other than malignant neoplasm: Secondary | ICD-10-CM | POA: Insufficient documentation

## 2015-01-23 LAB — MICROALBUMIN / CREATININE URINE RATIO
CREATININE, URINE: 142.9 mg/dL
MICROALB UR: 0.4 mg/dL (ref <2.0)
Microalb Creat Ratio: 2.8 mg/g (ref 0.0–30.0)

## 2015-01-23 LAB — HEPATITIS C ANTIBODY: HCV Ab: NEGATIVE

## 2015-01-23 LAB — FERRITIN: FERRITIN: 10 ng/mL — AB (ref 22–322)

## 2015-01-23 NOTE — Assessment & Plan Note (Signed)
DM: Diet and exercise discussed. Recommend an eye check regularly HTN: Seems well-controlled, check a BMP Polycythemia: Recently seen by hematology, they recommend him to continue with donations if Hg is ok. Will check a CBC, iron and ferritin. OSA On CPAP RTC 6 months

## 2015-02-11 ENCOUNTER — Encounter: Payer: Self-pay | Admitting: Internal Medicine

## 2015-02-11 ENCOUNTER — Ambulatory Visit (INDEPENDENT_AMBULATORY_CARE_PROVIDER_SITE_OTHER): Payer: Managed Care, Other (non HMO) | Admitting: Internal Medicine

## 2015-02-11 VITALS — BP 110/64 | HR 52 | Ht 65.0 in | Wt 233.0 lb

## 2015-02-11 DIAGNOSIS — K219 Gastro-esophageal reflux disease without esophagitis: Secondary | ICD-10-CM | POA: Diagnosis not present

## 2015-02-11 DIAGNOSIS — G4733 Obstructive sleep apnea (adult) (pediatric): Secondary | ICD-10-CM | POA: Diagnosis not present

## 2015-02-11 NOTE — Patient Instructions (Addendum)
Order- [facility]- continue CPAP auto 5-20, mask of choice, supplies, humidifier.  Patient needs replacement hose and supplies now. Please add AirView       Dx OSA

## 2015-02-11 NOTE — Progress Notes (Signed)
12/01/13- Dr Gwenette Greet HPI The patient comes in today for followup of his obstructive sleep apnea. He is wearing CPAP compliantly, and is having no issues with his mask fit. His only complaint is that his lower pressure limit on the automatic setting does not seem high enough when he initially puts on the CPAP device. I have told him that we can increase this. Overall, he feels that he is sleeping well with the device, and is satisfied with his daytime alertness. The patient's weight is fairly stable from the last visit a year.  02/11/15- 76 yoM followed for obstructive sleep apnea, complicated by essential hypertension, obesity, allergic rhinitis, obesity FOLLOWS FOR:  pt. states he wears CPAP 6-7 hours every night. pressure is good. supplies needed(hose and mask) CPAP auto 5-20, [facility] Don't find original sleep study. Had flu and pneumonia vax. Sleeping well wo EDS or snore. Notices a little wheeze occasionally only while eating  ROS-see HPI   Negative unless "+" Constitutional:    weight loss, night sweats, fevers, chills, fatigue, lassitude. HEENT:    headaches, difficulty swallowing, tooth/dental problems, sore throat,       sneezing, itching, ear ache, nasal congestion, post nasal drip, snoring CV:    chest pain, orthopnea, PND, swelling in lower extremities, anasarca,                                                 dizziness, palpitations Resp:   shortness of breath with exertion or at rest.                productive cough,   non-productive cough, coughing up of blood.              change in color of mucus.  wheezing.   Skin:    rash or lesions. GI:  No-   heartburn, indigestion, abdominal pain, nausea, vomiting, diarrhea,                 change in bowel habits, loss of appetite GU: dysuria, change in color of urine, no urgency or frequency.   flank pain. MS:   joint pain, stiffness, decreased range of motion, back pain. Neuro-     nothing unusual Psych:  change in mood or affect.   depression or anxiety.   memory loss.  OBJ- Physical Exam General- Alert, Oriented, Affect-appropriate, Distress- none acute Skin- rash-none, lesions- none, excoriation- none Lymphadenopathy- none Head- atraumatic            Eyes- Gross vision intact, PERRLA, conjunctivae and secretions clear            Ears- Hearing, canals-normal            Nose- Clear, no-Septal dev, mucus, polyps, erosion, perforation             Throat- Mallampati II , mucosa clear , drainage- none, tonsils- atrophic Neck- flexible , trachea midline, no stridor , thyroid nl, carotid no bruit Chest - symmetrical excursion , unlabored           Heart/CV- RRR , no murmur , no gallop  , no rub, nl s1 s2                           - JVD- none , edema- none, stasis changes- none, varices- none  Lung- clear to P&A, wheeze- none, cough- none , dullness-none, rub- none           Chest wall-  Abd-  Br/ Gen/ Rectal- Not done, not indicated Extrem- cyanosis- none, clubbing, none, atrophy- none, strength- nl Neuro- grossly intact to observation

## 2015-02-13 NOTE — Assessment & Plan Note (Signed)
He is satisfied with his current settings Plan-no changes needed. Download for documentation

## 2015-02-13 NOTE — Assessment & Plan Note (Signed)
We discussed careful swallowing and LPR precautions

## 2015-03-01 ENCOUNTER — Other Ambulatory Visit: Payer: Self-pay | Admitting: Internal Medicine

## 2015-03-29 ENCOUNTER — Other Ambulatory Visit: Payer: Self-pay | Admitting: Internal Medicine

## 2015-06-28 ENCOUNTER — Other Ambulatory Visit: Payer: Self-pay | Admitting: Internal Medicine

## 2015-06-28 NOTE — Telephone Encounter (Signed)
Okay #180, no refills 

## 2015-06-28 NOTE — Telephone Encounter (Signed)
Pt is requesting refill on Bentyl.  Last OV: 01/22/2015  Last Fill: 03/01/2015 #180 and 0RF Pt sig: 1 tablet tid PRN   Okay to refill?

## 2015-06-28 NOTE — Telephone Encounter (Signed)
Rx sent 

## 2015-10-14 ENCOUNTER — Other Ambulatory Visit: Payer: Self-pay | Admitting: Internal Medicine

## 2015-10-28 ENCOUNTER — Other Ambulatory Visit: Payer: Self-pay | Admitting: Internal Medicine

## 2015-10-28 NOTE — Telephone Encounter (Signed)
Okay #180, no refills 

## 2015-10-28 NOTE — Telephone Encounter (Signed)
Pt is requesting refill on Bentyl.  Last OV: 01/22/2015, appt scheduled 11/17/2015 Last Fill: 06/28/2015 #180 and 0RF Pt sig: 1 tablet po tid prn   Okay to refill?

## 2015-10-28 NOTE — Telephone Encounter (Signed)
Rx sent 

## 2015-11-17 ENCOUNTER — Ambulatory Visit (INDEPENDENT_AMBULATORY_CARE_PROVIDER_SITE_OTHER): Payer: 59 | Admitting: Internal Medicine

## 2015-11-17 ENCOUNTER — Encounter: Payer: Self-pay | Admitting: Internal Medicine

## 2015-11-17 VITALS — BP 122/72 | HR 58 | Temp 98.1°F | Resp 12 | Ht 65.0 in | Wt 225.2 lb

## 2015-11-17 DIAGNOSIS — D751 Secondary polycythemia: Secondary | ICD-10-CM | POA: Diagnosis not present

## 2015-11-17 DIAGNOSIS — E118 Type 2 diabetes mellitus with unspecified complications: Secondary | ICD-10-CM

## 2015-11-17 DIAGNOSIS — I10 Essential (primary) hypertension: Secondary | ICD-10-CM

## 2015-11-17 NOTE — Progress Notes (Signed)
Pre visit review using our clinic review tool, if applicable. No additional management support is needed unless otherwise documented below in the visit note. 

## 2015-11-17 NOTE — Patient Instructions (Addendum)
GO TO THE LAB : Get the blood work     GO TO THE FRONT DESK Schedule your next appointment for a  physical exam in 3-4 months   You have diabetes, good information about diet can be found at the American diabetes Association website. If you like we can send you to see a nutritionist.

## 2015-11-17 NOTE — Progress Notes (Signed)
Subjective:    Patient ID: Donald Jones, male    DOB: 03-25-1946, 70 y.o.   MRN: [id number]  DOS:  11/17/2015 Type of visit - description : Routine checkup Interval history: DJD, has on and off left knee pain, decreased to some extent when he  start moving but when he frequently gets in and out of the truck it hurts more. No swelling. Taking Tylenol. DM: Patient says he did not know he has diabetes. HTN: Good med compliance, BP today is very good Polycythemia, no recent donations per my instructions.  Review of Systems Denies chest pain or difficulty breathing No nausea, vomiting, diarrhea. On a CPAP  Past Medical History:  Diagnosis Date  . GERD (gastroesophageal reflux disease)   . Hypertension   . Kidney stones    several episodes.  Last one in 2011.    . Obesity    100-lb weight loss since 2011.   . Polycythemia   . Sleep apnea    on Cpap    Past Surgical History:  Procedure Laterality Date  . CHOLECYSTECTOMY    . HAND SURGERY  2010   R hand , d/t a injury, 3 surgeries     Social History   Social History  . Marital status: Widowed    Spouse name: N/A  . Number of children: 3   . 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 Smoking status: Never Smoker  . Smokeless tobacco: Never Used  . Alcohol use No     Comment: none for 15 years  . Drug use: No  . Sexual activity: No   Other Topics Concern  . Not on file   Social History Narrative   1ST WIFE DIED FROM LUNG CANCER, 2ND WIFE DIED FROM RENAL CANCER.     Married x 4 , lives w/ wife        Medication List       Accurate as of 11/17/15 11:59 PM. Always use your most recent med list.          acetaminophen 325 MG tablet Commonly known as:  TYLENOL Take 650 mg by mouth every 6 (six) hours as needed for pain.   aspirin 81 MG tablet Take 81 mg by mouth daily.   B-12 500 MCG Tabs Take 1 tablet by mouth daily.   dicyclomine 20 MG  tablet Commonly known as:  BENTYL Take 1 tablet (20 mg total) by mouth 3 (three) times daily with meals as needed for spasms.   Flaxseed Oil 1000 MG Caps Take 1,000 mg by mouth daily.   Ginkgo 60 MG Tabs Take 2 tablets by mouth daily.   hydrochlorothiazide 25 MG tablet Commonly known as:  HYDRODIURIL Take 1 tablet (25 mg total) by mouth daily.   multivitamin tablet Take 1 tablet by mouth daily.   omeprazole 40 MG capsule Commonly known as:  PRILOSEC Take 1 capsule (40 mg total) by mouth daily.   SINUS BUSTER NA Place 1 puff into the nose daily as needed.          Objective:   Physical Exam BP 122/72 (BP Location: Left Arm, Patient Position: Sitting, Cuff Size: Normal)   Pulse (!) 58   Temp 98.1 F (36.7 C) (Oral)   Resp 12   Ht 5\' 5"  (1.651 m)   Wt 225 lb 4 oz (102.2 kg)   SpO2 96%   BMI 37.48 kg/m  General:  Well developed, well nourished . NAD.  HEENT:  Normocephalic . Face symmetric, atraumatic Lungs:  CTA B Normal respiratory effort, no intercostal retractions, no accessory muscle use. Heart: RRR,  no murmur.  No pretibial edema bilaterally  DIABETIC FEET EXAM: No lower extremity edema Skin normal, nails dystrophic throughout Pinprick examination of the feet normal. Neurologic:  alert & oriented X3.  Speech normal, gait  unassisted and slightly limited by foot drop Psych--  Cognition and judgment appear intact.  Cooperative with normal attention span and concentration.  Behavior appropriate. No anxious or depressed appearing.      Assessment & Plan:   Assessment  > DM  HTN Polycythemia- sees hematology, likely d/t OSA-diuretics, JAK2 (-) ~ 2014, last OV 10-2014, f/u prn OSA on CPAP DJD- saw Dr Rhona Raider before  GERD IBS? Urolithiasis, several episodes Foot drop, right  PLAN: DM: I advised patient that he does have DM with a A1c of 6.6, diet discussed, he is active. Encourage self learning. See instructions. Will check A1c. HTN: cont  diuretics, check a BMP Polycythemia: Last visit with hematology ~10-2014, rec to RTC PRN. Last labs show mild anemia and iron deficiency, he was instructed not to donate blood . Will check labs again today. DJD: C/o of knee pain on and off, wonders about the dose of Tylenol, recommend not to use > 1000 mg bid on regular basis RTC 3-4 months, CPX

## 2015-11-18 LAB — CBC WITH DIFFERENTIAL/PLATELET
BASOS PCT: 0.3 % (ref 0.0–3.0)
Basophils Absolute: 0 10*3/uL (ref 0.0–0.1)
EOS PCT: 2.9 % (ref 0.0–5.0)
Eosinophils Absolute: 0.3 10*3/uL (ref 0.0–0.7)
HEMATOCRIT: 42.6 % (ref 39.0–52.0)
HEMOGLOBIN: 13.6 g/dL (ref 13.0–17.0)
LYMPHS ABS: 3.1 10*3/uL (ref 0.7–4.0)
Lymphocytes Relative: 35.5 % (ref 12.0–46.0)
MCHC: 31.8 g/dL (ref 30.0–36.0)
MCV: 76.2 fl — AB (ref 78.0–100.0)
MONO ABS: 0.6 10*3/uL (ref 0.1–1.0)
Monocytes Relative: 7.1 % (ref 3.0–12.0)
NEUTROS ABS: 4.8 10*3/uL (ref 1.4–7.7)
Neutrophils Relative %: 54.2 % (ref 43.0–77.0)
Platelets: 264 10*3/uL (ref 150.0–400.0)
RBC: 5.59 Mil/uL (ref 4.22–5.81)
RDW: 18 % — ABNORMAL HIGH (ref 11.5–15.5)
WBC: 8.8 10*3/uL (ref 4.0–10.5)

## 2015-11-18 LAB — BASIC METABOLIC PANEL
BUN: 17 mg/dL (ref 6–23)
CO2: 31 meq/L (ref 19–32)
Calcium: 9.6 mg/dL (ref 8.4–10.5)
Chloride: 102 mEq/L (ref 96–112)
Creatinine, Ser: 1.36 mg/dL (ref 0.40–1.50)
GFR: 55.39 mL/min — AB (ref >60.00)
GLUCOSE: 99 mg/dL (ref 70–99)
POTASSIUM: 4.5 meq/L (ref 3.5–5.1)
Sodium: 140 mEq/L (ref 135–145)

## 2015-11-18 LAB — FERRITIN: Ferritin: 8.6 ng/mL — ABNORMAL LOW (ref 22.0–322.0)

## 2015-11-18 LAB — HEMOGLOBIN A1C: Hgb A1c MFr Bld: 6.8 % — ABNORMAL HIGH (ref 4.6–6.5)

## 2015-11-18 LAB — IRON: IRON: 13 ug/dL — AB (ref 42–165)

## 2015-11-18 NOTE — Assessment & Plan Note (Signed)
DM: I advised patient that he does have DM with a A1c of 6.6, diet discussed, he is active. Encourage self learning. See instructions. Will check A1c. HTN: cont diuretics, check a BMP Polycythemia: Last visit with hematology ~10-2014, rec to RTC PRN. Last labs show mild anemia and iron deficiency, he was instructed not to donate blood . Will check labs again today. DJD: C/o of knee pain on and off, wonders about the dose of Tylenol, recommend not to use > 1000 mg bid on regular basis RTC 3-4 months, CPX

## 2015-12-31 ENCOUNTER — Other Ambulatory Visit: Payer: Self-pay | Admitting: Internal Medicine

## 2016-01-14 ENCOUNTER — Other Ambulatory Visit: Payer: Self-pay | Admitting: Internal Medicine

## 2016-01-28 ENCOUNTER — Ambulatory Visit: Payer: Medicare Other

## 2016-02-03 ENCOUNTER — Ambulatory Visit (INDEPENDENT_AMBULATORY_CARE_PROVIDER_SITE_OTHER): Payer: 59

## 2016-02-03 DIAGNOSIS — Z23 Encounter for immunization: Secondary | ICD-10-CM | POA: Diagnosis not present

## 2016-02-08 ENCOUNTER — Other Ambulatory Visit: Payer: Self-pay | Admitting: Internal Medicine

## 2016-02-14 ENCOUNTER — Encounter: Payer: Self-pay | Admitting: Internal Medicine

## 2016-02-14 ENCOUNTER — Ambulatory Visit (INDEPENDENT_AMBULATORY_CARE_PROVIDER_SITE_OTHER): Payer: 59 | Admitting: Internal Medicine

## 2016-02-14 DIAGNOSIS — G4733 Obstructive sleep apnea (adult) (pediatric): Secondary | ICD-10-CM | POA: Diagnosis not present

## 2016-02-14 NOTE — Progress Notes (Signed)
12/01/13- Dr Gwenette Greet HPI The patient comes in today for followup of his obstructive sleep apnea. He is wearing CPAP compliantly, and is having no issues with his mask fit. His only complaint is that his lower pressure limit on the automatic setting does not seem high enough when he initially puts on the CPAP device. I have told him that we can increase this. Overall, he feels that he is sleeping well with the device, and is satisfied with his daytime alertness. The patient's weight is fairly stable from the last visit a year.  02/11/15- 9 yoM followed for obstructive sleep apnea, complicated by essential hypertension, obesity, allergic rhinitis, obesity FOLLOWS FOR:  pt. states he wears CPAP 6-7 hours every night. pressure is good. supplies needed(hose and mask) CPAP auto 5-20, [facility] Don't find original sleep study. Had flu and pneumonia vax. Sleeping well wo EDS or snore. Notices a little wheeze occasionally only while eating  02/14/2016-70 year old male never smoker followed for OSA, complicated by HBP, obesity, allergic rhinitis CPAP auto 5-20/[facility] Follow up. pt states need a new mask. pt states he is sleeping 7 hours.  He feels he is doing quite well with CPAP auto at these pressures and his nasal mask. Sleeping better. Notices occasional wheeze after meals without heartburn or reflux recognized.  ROS-see HPI   Negative unless "+" Constitutional:    weight loss, night sweats, fevers, chills, fatigue, lassitude. HEENT:    headaches, difficulty swallowing, tooth/dental problems, sore throat,       sneezing, itching, ear ache, nasal congestion, post nasal drip, snoring CV:    chest pain, orthopnea, PND, swelling in lower extremities, anasarca,                                                 dizziness, palpitations Resp:   shortness of breath with exertion or at rest.                productive cough,   non-productive cough, coughing up of blood.              change in color of mucus.   + wheezing.   Skin:    rash or lesions. GI:  No-   heartburn, indigestion, abdominal pain, nausea, vomiting, diarrhea,                 change in bowel habits, loss of appetite GU: dysuria, change in color of urine, no urgency or frequency.   flank pain. MS:   joint pain, stiffness, decreased range of motion, back pain. Neuro-     nothing unusual Psych:  change in mood or affect.  depression or anxiety.   memory loss.  OBJ- Physical Exam General- Alert, Oriented, Affect-appropriate, Distress- none acute, + overweight Skin- rash-none, lesions- none, excoriation- none, + tattoos Lymphadenopathy- none Head- atraumatic            Eyes- Gross vision intact, PERRLA, conjunctivae and secretions clear            Ears- Hearing, canals-normal            Nose- Clear, no-Septal dev, mucus, polyps, erosion, perforation             Throat- Mallampati II , mucosa clear , drainage- none, tonsils- atrophic Neck- flexible , trachea midline, no stridor , thyroid nl, carotid no bruit Chest - symmetrical excursion ,  unlabored           Heart/CV- RRR , no murmur , no gallop  , no rub, nl s1 s2                           - JVD- none , edema- none, stasis changes- none, varices- none           Lung- clear to P&A, wheeze- none, cough- none , dullness-none, rub- none           Chest wall-  Abd-  Br/ Gen/ Rectal- Not done, not indicated Extrem- cyanosis- none, clubbing, none, atrophy- none, strength- nl Neuro- grossly intact to observation

## 2016-02-14 NOTE — Patient Instructions (Addendum)
We can continue CPAP auto 5-20 , mask of choice, humidifier, supplies, AirView     Dx OSA  Please call if we can help 

## 2016-02-15 NOTE — Assessment & Plan Note (Signed)
Doing well with auto 5-20/[facility]. Weight loss would help. He needs replacement mask and supplies as discussed.

## 2016-02-15 NOTE — Assessment & Plan Note (Signed)
Weight loss would help medical status significantly and is encouraged.

## 2016-02-22 ENCOUNTER — Ambulatory Visit (INDEPENDENT_AMBULATORY_CARE_PROVIDER_SITE_OTHER): Payer: 59 | Admitting: Internal Medicine

## 2016-02-22 ENCOUNTER — Encounter: Payer: Self-pay | Admitting: Internal Medicine

## 2016-02-22 VITALS — BP 124/80 | HR 58 | Temp 98.1°F | Resp 14 | Ht 65.0 in | Wt 221.1 lb

## 2016-02-22 DIAGNOSIS — I10 Essential (primary) hypertension: Secondary | ICD-10-CM | POA: Diagnosis not present

## 2016-02-22 DIAGNOSIS — Z0000 Encounter for general adult medical examination without abnormal findings: Secondary | ICD-10-CM | POA: Diagnosis not present

## 2016-02-22 DIAGNOSIS — D751 Secondary polycythemia: Secondary | ICD-10-CM

## 2016-02-22 DIAGNOSIS — M21371 Foot drop, right foot: Secondary | ICD-10-CM | POA: Diagnosis not present

## 2016-02-22 DIAGNOSIS — Z125 Encounter for screening for malignant neoplasm of prostate: Secondary | ICD-10-CM

## 2016-02-22 NOTE — Assessment & Plan Note (Addendum)
Td 2014, pneumonia shot 2004; prevnar: 01-2015; Shingles shot 2011; had a flu shot    CCS: cscope ~ 2008 in [place],  [place] again 05-2012, Dr Ardis Hughs, neg, next 2019 due to [place] DRE wnl 2016, check a  PSA today Counseled: diet, exercise Labs: AST, ALT, FLP, CBC, iron, ferritin, PSA, A1c

## 2016-02-22 NOTE — Progress Notes (Signed)
Pre visit review using our clinic review tool, if applicable. No additional management support is needed unless otherwise documented below in the visit note. 

## 2016-02-22 NOTE — Patient Instructions (Signed)
GO TO THE FRONT DESK Schedule your next appointment for a  Check up in 6 months   Schedule labs to be done this week, fasting        Fall Prevention and Home Safety Falls cause injuries and can affect all age groups. It is possible to use preventive measures to significantly decrease the likelihood of falls. There are many simple measures which can make your home safer and prevent falls. OUTDOORS  Repair cracks and edges of walkways and driveways.  Remove high doorway thresholds.  Trim shrubbery on the main path into your home.  Have good outside lighting.  Clear walkways of tools, rocks, debris, and clutter.  Check that handrails are not broken and are securely fastened. Both sides of steps should have handrails.  Have leaves, snow, and ice cleared regularly.  Use sand or salt on walkways during winter months.  In the garage, clean up grease or oil spills. BATHROOM  Install night lights.  Install grab bars by the toilet and in the tub and shower.  Use non-skid mats or decals in the tub or shower.  Place a plastic non-slip stool in the shower to sit on, if needed.  Keep floors dry and clean up all water on the floor immediately.  Remove soap buildup in the tub or shower on a regular basis.  Secure bath mats with non-slip, double-sided rug tape.  Remove throw rugs and tripping hazards from the floors. BEDROOMS  Install night lights.  Make sure a bedside light is easy to reach.  Do not use oversized bedding.  Keep a telephone by your bedside.  Have a firm chair with side arms to use for getting dressed.  Remove throw rugs and tripping hazards from the floor. KITCHEN  Keep handles on pots and pans turned toward the center of the stove. Use back burners when possible.  Clean up spills quickly and allow time for drying.  Avoid walking on wet floors.  Avoid hot utensils and knives.  Position shelves so they are not too high or low.  Place commonly  used objects within easy reach.  If necessary, use a sturdy step stool with a grab bar when reaching.  Keep electrical cables out of the way.  Do not use floor polish or wax that makes floors slippery. If you must use wax, use non-skid floor wax.  Remove throw rugs and tripping hazards from the floor. STAIRWAYS  Never leave objects on stairs.  Place handrails on both sides of stairways and use them. Fix any loose handrails. Make sure handrails on both sides of the stairways are as long as the stairs.  Check carpeting to make sure it is firmly attached along stairs. Make repairs to worn or loose carpet promptly.  Avoid placing throw rugs at the top or bottom of stairways, or properly secure the rug with carpet tape to prevent slippage. Get rid of throw rugs, if possible.  Have an electrician put in a light switch at the top and bottom of the stairs. OTHER FALL PREVENTION TIPS  Wear low-heel or rubber-soled shoes that are supportive and fit well. Wear closed toe shoes.  When using a stepladder, make sure it is fully opened and both spreaders are firmly locked. Do not climb a closed stepladder.  Add color or contrast paint or tape to grab bars and handrails in your home. Place contrasting color strips on first and last steps.  Learn and use mobility aids as needed. Install an electrical emergency response  system.  Turn on lights to avoid dark areas. Replace light bulbs that burn out immediately. Get light switches that glow.  Arrange furniture to create clear pathways. Keep furniture in the same place.  Firmly attach carpet with non-skid or double-sided tape.  Eliminate uneven floor surfaces.  Select a carpet pattern that does not visually hide the edge of steps.  Be aware of all pets. OTHER HOME SAFETY TIPS  Set the water temperature for 120 F (48.8 C).  Keep emergency numbers on or near the telephone.  Keep smoke detectors on every level of the home and near sleeping  areas. Document Released: 03/24/2002 Document Revised: 10/03/2011 Document Reviewed: 06/23/2011 [facility] Patient Information 2015 [place], [place]. This information is not intended to replace advice given to you by your health care provider. Make sure you discuss any questions you have with your health care provider.   Preventive Care for Adults Ages 79 and over  Blood pressure check.** / Every 1 to 2 years.  Lipid and cholesterol check.**/ Every 5 years beginning at age 68.  Lung cancer screening. / Every year if you are aged 68-80 years and have a 30-pack-year history of smoking and currently smoke or have quit within the past 15 years. Yearly screening is stopped once you have quit smoking for at least 15 years or develop a health problem that would prevent you from having lung cancer treatment.  Fecal occult blood test (FOBT) of stool. / Every year beginning at age 68 and continuing until age 88. You may not have to do this test if you get a colonoscopy every 10 years.  Flexible sigmoidoscopy** or colonoscopy.** / Every 5 years for a flexible sigmoidoscopy or every 10 years for a colonoscopy beginning at age 68 and continuing until age 16.  Hepatitis C blood test.** / For all people born from [birth year] through 1963 and any individual with known risks for hepatitis C.  Abdominal aortic aneurysm (AAA) screening.** / A one-time screening for ages 72 to 30 years who are current or former smokers.  Skin self-exam. / Monthly.  Influenza vaccine. / Every year.  Tetanus, diphtheria, and acellular pertussis (Tdap/Td) vaccine.** / 1 dose of Td every 10 years.  Varicella vaccine.** / Consult your health care provider.  Zoster vaccine.** / 1 dose for adults aged 16 years or older.  Pneumococcal 13-valent conjugate (PCV13) vaccine.** / Consult your health care provider.  Pneumococcal polysaccharide (PPSV23) vaccine.** / 1 dose for all adults aged 45 years and older.  Meningococcal vaccine.** /  Consult your health care provider.  Hepatitis A vaccine.** / Consult your health care provider.  Hepatitis B vaccine.** / Consult your health care provider.  Haemophilus influenzae type b (Hib) vaccine.** / Consult your health care provider. **Family history and personal history of risk and conditions may change your health care provider's recommendations. Document Released: 05/30/2001 Document Revised: 04/08/2013 Document Reviewed: 08/29/2010 [facility] Patient Information 2015 [place], [place]. This information is not intended to replace advice given to you by your health care provider. Make sure you discuss any questions you have with your health care provider.

## 2016-02-22 NOTE — Progress Notes (Signed)
Subjective:    Patient ID: Donald Jones, male    DOB: October 18, 1945, 70 y.o.   MRN: [id number]  DOS:  02/22/2016 Type of visit - description : Complete physical exam Interval history: In general feeling well, we review his labs and medications.   Review of Systems Constitutional: No fever. No chills. No unexplained wt changes. No unusual sweats  HEENT: No dental problems, no ear discharge, no facial swelling, no voice changes. No eye discharge, no eye  redness , no  intolerance to light   Respiratory: No wheezing , no  difficulty breathing. No cough , no mucus production  Cardiovascular: No CP, no leg swelling , no  Palpitations  GI: Last week for a day had some diarrhea with loose stools and frequent bowel movements, no blood in the stools, nausea, vomiting. Symptoms resolved  Endocrine: No polyphagia, no polyuria , no polydipsia  GU: No dysuria, gross hematuria, difficulty urinating. No urinary urgency, no frequency.  Musculoskeletal: No joint swellings or unusual aches or pains  Skin: No change in the color of the skin, palor , no  Rash  Allergic, immunologic: No environmental allergies , no  food allergies  Neurological: No dizziness no  syncope. No headaches. No diplopia, no slurred, no slurred speech, no motor deficits, no facial  Numbness  Hematological: No enlarged lymph nodes, no easy bruising , no unusual bleedings  Psychiatry: No suicidal ideas, no hallucinations, no beavior problems, no confusion.  No unusual/severe anxiety, no depression   Past Medical History:  Diagnosis Date  . GERD (gastroesophageal reflux disease)   . Hypertension   . Kidney stones    several episodes.  Last one in 2011.    . Obesity    100-lb weight loss since 2011.   . Polycythemia   . Sleep apnea    on Cpap    Past Surgical History:  Procedure Laterality Date  . CHOLECYSTECTOMY    . HAND SURGERY  2010   R hand , d/t a injury, 3 surgeries     Social History   Social  History  . Marital status: Widowed    Spouse name: N/A  . Number of children: 3   . 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 Smoking status: Never Smoker  . Smokeless tobacco: Never Used  . Alcohol use No     Comment: none for 15 years  . Drug use: No  . Sexual activity: No   Other Topics Concern  . Not on file   Social History Narrative   1ST WIFE DIED FROM LUNG CANCER, 2ND WIFE DIED FROM RENAL CANCER.     Married x 4 , lives w/ wife     Family History  Problem Relation Age of Onset  . Diabetes Mother   . Coronary artery disease Mother   . Stroke Father     M and F  . Hypertension Father   . Heart disease Sister     ?  Marland Kitchen Alcohol abuse Sister   . Colon cancer Sister 71    age ~ 70  . Prostate cancer Neg Hx        Medication List       Accurate as of 02/22/16 11:59 PM. Always use your most recent med list.          acetaminophen 325 MG tablet Commonly known as:  TYLENOL Take 650 mg by mouth every 6 (six)  hours as needed for pain.   aspirin 81 MG tablet Take 81 mg by mouth daily.   B-12 500 MCG Tabs Take 1 tablet by mouth daily.   dicyclomine 20 MG tablet Commonly known as:  BENTYL Take 1 tablet (20 mg total) by mouth 3 (three) times daily before meals.   Flaxseed Oil 1000 MG Caps Take 1,000 mg by mouth daily.   Ginkgo 60 MG Tabs Take 2 tablets by mouth daily.   hydrochlorothiazide 25 MG tablet Commonly known as:  HYDRODIURIL Take 1 tablet (25 mg total) by mouth daily.   multivitamin tablet Take 1 tablet by mouth daily.   omeprazole 40 MG capsule Commonly known as:  PRILOSEC Take 1 capsule (40 mg total) by mouth daily.   SINUS BUSTER NA Place 1 puff into the nose daily as needed.          Objective:   Physical Exam BP 124/80 (BP Location: Left Arm, Patient Position: Sitting, Cuff Size: Normal)   Pulse (!) 58   Temp 98.1 F (36.7 C) (Oral)   Resp 14   Ht 5\' 5"  (1.651 m)    Wt 221 lb 2 oz (100.3 kg)   SpO2 97%   BMI 36.80 kg/m   General:   Well developed, overweight appearing . NAD.  Neck: No  thyromegaly  HEENT:  Normocephalic . Face symmetric, atraumatic. Anicteric face Lungs:  CTA B Normal respiratory effort, no intercostal retractions, no accessory muscle use. Heart: RRR,  no murmur.  No pretibial edema bilaterally  Abdomen:  Not distended, soft, non-tender. No rebound or rigidity.   Skin: Exposed areas without rash. Not pale. Not jaundice Neurologic:  alert & oriented X3.  Speech normal, gait appropriate for age and unassisted Strength symmetric and appropriate for age.  Psych: Cognition and judgment appear intact.  Cooperative with normal attention span and concentration.  Behavior appropriate. No anxious or depressed appearing.    Assessment & Plan:   assessment  > DM  HTN Polycythemia- sees hematology, likely d/t OSA-diuretics, JAK2 (-) ~ 2014, last OV 10-2014, f/u prn OSA on CPAP DJD- saw Dr Rhona Raider before  R Foot drop  GERD IBS? GI sx on and off, previously dx w/ IBS Urolithiasis, several episodes Foot drop, right  PLAN: DM:  Room for  improvement on diet, counseled, check A1c, microalbumin. Had an eye checkup 2 weeks ago and reportedly normal HTN: Controlled on diuretics. Polycythemia: Last hemoglobin satisfactory but iron was low, since then he has not donated blood. Recheck a CBC, iron and ferritin. Sleep apnea: Encourage good CPAP compliance Right foot drop: Was about to fall few times, recommend to see rehabilitation medicine for treatment. Will call when ready. RTC 6 months

## 2016-02-23 NOTE — Assessment & Plan Note (Signed)
DM:  Room for  improvement on diet, counseled, check A1c, microalbumin. Had an eye checkup 2 weeks ago and reportedly normal HTN: Controlled on diuretics. Polycythemia: Last hemoglobin satisfactory but iron was low, since then he has not donated blood. Recheck a CBC, iron and ferritin. Sleep apnea: Encourage good CPAP compliance Right foot drop: Was about to fall few times, recommend to see rehabilitation medicine for treatment. Will call when ready. RTC 6 months

## 2016-02-24 ENCOUNTER — Other Ambulatory Visit (INDEPENDENT_AMBULATORY_CARE_PROVIDER_SITE_OTHER): Payer: 59

## 2016-02-24 DIAGNOSIS — Z0000 Encounter for general adult medical examination without abnormal findings: Secondary | ICD-10-CM | POA: Diagnosis not present

## 2016-02-24 DIAGNOSIS — Z125 Encounter for screening for malignant neoplasm of prostate: Secondary | ICD-10-CM

## 2016-02-24 DIAGNOSIS — D751 Secondary polycythemia: Secondary | ICD-10-CM | POA: Diagnosis not present

## 2016-02-24 LAB — CBC WITH DIFFERENTIAL/PLATELET
BASOS ABS: 0 10*3/uL (ref 0.0–0.1)
Basophils Relative: 0.6 % (ref 0.0–3.0)
EOS ABS: 0.2 10*3/uL (ref 0.0–0.7)
Eosinophils Relative: 2.5 % (ref 0.0–5.0)
HEMATOCRIT: 45 % (ref 39.0–52.0)
Hemoglobin: 14.7 g/dL (ref 13.0–17.0)
LYMPHS ABS: 2 10*3/uL (ref 0.7–4.0)
LYMPHS PCT: 30.9 % (ref 12.0–46.0)
MCHC: 32.7 g/dL (ref 30.0–36.0)
MCV: 79.5 fl (ref 78.0–100.0)
MONO ABS: 0.5 10*3/uL (ref 0.1–1.0)
Monocytes Relative: 8.6 % (ref 3.0–12.0)
NEUTROS ABS: 3.7 10*3/uL (ref 1.4–7.7)
NEUTROS PCT: 57.4 % (ref 43.0–77.0)
PLATELETS: 236 10*3/uL (ref 150.0–400.0)
RBC: 5.66 Mil/uL (ref 4.22–5.81)
RDW: 17.1 % — AB (ref 11.5–15.5)
WBC: 6.4 10*3/uL (ref 4.0–10.5)

## 2016-02-24 LAB — MICROALBUMIN / CREATININE URINE RATIO
CREATININE, U: 112.4 mg/dL
MICROALB UR: 0.9 mg/dL (ref 0.0–1.9)
Microalb Creat Ratio: 0.8 mg/g (ref 0.0–30.0)

## 2016-02-24 LAB — LIPID PANEL
CHOL/HDL RATIO: 3
Cholesterol: 156 mg/dL (ref 0–200)
HDL: 47.6 mg/dL (ref >39.00)
LDL CALC: 99 mg/dL (ref 0–99)
NonHDL: 108.77
TRIGLYCERIDES: 47 mg/dL (ref 0.0–149.0)
VLDL: 9.4 mg/dL (ref 0.0–40.0)

## 2016-02-24 LAB — PSA: PSA: 2.34 ng/mL (ref 0.10–4.00)

## 2016-02-24 LAB — AST: AST: 21 U/L (ref 0–37)

## 2016-02-24 LAB — ALT: ALT: 27 U/L (ref 0–53)

## 2016-02-24 LAB — IRON: Iron: 57 ug/dL (ref 42–165)

## 2016-02-24 LAB — HEMOGLOBIN A1C: HEMOGLOBIN A1C: 6.6 % — AB (ref 4.6–6.5)

## 2016-02-24 LAB — FERRITIN: Ferritin: 10.3 ng/mL — ABNORMAL LOW (ref 22.0–322.0)

## 2016-02-28 ENCOUNTER — Other Ambulatory Visit: Payer: Self-pay | Admitting: Internal Medicine

## 2016-03-05 ENCOUNTER — Encounter: Payer: Self-pay | Admitting: Internal Medicine

## 2016-05-22 ENCOUNTER — Encounter (HOSPITAL_COMMUNITY): Payer: Self-pay

## 2016-05-22 ENCOUNTER — Emergency Department (HOSPITAL_COMMUNITY)
Admission: EM | Admit: 2016-05-22 | Discharge: 2016-05-22 | Disposition: A | Discharge status: Home / Self Care | Payer: 59 | Attending: Emergency Medicine | Admitting: Emergency Medicine

## 2016-05-22 DIAGNOSIS — S61211A Laceration without foreign body of left index finger without damage to nail, initial encounter: Secondary | ICD-10-CM | POA: Insufficient documentation

## 2016-05-22 DIAGNOSIS — W260XXA Contact with knife, initial encounter: Secondary | ICD-10-CM | POA: Diagnosis not present

## 2016-05-22 DIAGNOSIS — Y939 Activity, unspecified: Secondary | ICD-10-CM | POA: Insufficient documentation

## 2016-05-22 DIAGNOSIS — Y92009 Unspecified place in unspecified non-institutional (private) residence as the place of occurrence of the external cause: Secondary | ICD-10-CM | POA: Insufficient documentation

## 2016-05-22 DIAGNOSIS — Y999 Unspecified external cause status: Secondary | ICD-10-CM | POA: Insufficient documentation

## 2016-05-22 DIAGNOSIS — Z23 Encounter for immunization: Secondary | ICD-10-CM | POA: Insufficient documentation

## 2016-05-22 DIAGNOSIS — Z7982 Long term (current) use of aspirin: Secondary | ICD-10-CM | POA: Diagnosis not present

## 2016-05-22 DIAGNOSIS — Z79899 Other long term (current) drug therapy: Secondary | ICD-10-CM | POA: Diagnosis not present

## 2016-05-22 DIAGNOSIS — I10 Essential (primary) hypertension: Secondary | ICD-10-CM | POA: Diagnosis not present

## 2016-05-22 MED ORDER — TETANUS-DIPHTH-ACELL PERTUSSIS 5-2.5-18.5 LF-MCG/0.5 IM SUSP
0.5000 mL | Freq: Once | INTRAMUSCULAR | Status: AC
  Administered 2016-05-22: 0.5 mL via INTRAMUSCULAR
  Filled 2016-05-22: qty 0.5

## 2016-05-22 MED ORDER — LIDOCAINE HCL (PF) 1 % IJ SOLN
5.0000 mL | Freq: Once | INTRAMUSCULAR | Status: AC
  Administered 2016-05-22: 5 mL
  Filled 2016-05-22: qty 30

## 2016-05-22 NOTE — ED Notes (Signed)
Pt states that he cut himself with a pocket knife while trying to cut tennis balls to place on a walker, pt has a laceration noted on left hand pointer finger near the crease of the hand that has stopped bleeding at this time, area has pain 1/10  Soreness per pt . Pt is alert and oriented x 4.

## 2016-05-22 NOTE — Discharge Instructions (Signed)
Keep area clean and dry. Pat dry after shower. Do not soak. Keep out of sun to help prevent scar formation. Please return here or urgent care in 10 days for recheck and suture removal.   Contact a health care provider if: You received a tetanus shot and you have swelling, severe pain, redness, or bleeding at the injection site. You have a fever. A wound that was closed breaks open. You notice a bad smell coming from your wound or your dressing. You notice something coming out of the wound, such as wood or glass. Your pain is not controlled with medicine. You have increased redness, swelling, or pain at the site of your wound. You have fluid, blood, or pus coming from your wound. You notice a change in the color of your skin near your wound. You need to change the dressing frequently due to fluid, blood, or pus draining from the wound. You develop a new rash. You develop numbness around the wound. Get help right away if: You develop severe swelling around the wound. Your pain suddenly increases and is severe. You develop painful lumps near the wound or on skin that is anywhere on your body. You have a red streak going away from your wound. The wound is on your hand or foot and you cannot properly move a finger or toe. The wound is on your hand or foot and you notice that your fingers or toes look pale or bluish.

## 2016-05-22 NOTE — ED Notes (Signed)
Pt laceration cleansed with wound cleanser, and covered with clean dry gauze.

## 2016-05-22 NOTE — ED Provider Notes (Signed)
[facility] DEPT Provider Note   CSN: [id number] Arrival date & time: 05/22/16  1846   By signing my name below, I, Donald Jones, attest that this documentation has been prepared under the direction and in the presence of non-physician practitioner, Flonnie Overman, PA-C. Electronically Signed: Dolores Jones, Scribe. 05/22/2016. 8:42 PM.   History   Chief Complaint Chief Complaint  Patient presents with  . Laceration   The history is provided by the patient. No language interpreter was used.   HPI Comments:  Donald Jones is a 71 y.o. male who presents to the Emergency Department having cut his left second digit with a knife at home. He notes that he was trying to cut a tennis ball with a pocket knife when he slipped and cut his finger. Bleeding controlled PTA. He has not tried anything PTA. He denies any fevers, chills, nausea, or vomiting. Pt has full ROM, strength and sensation to the area. He is compliant with a daily aspirin but otherwise does not take any blood-thinning medication. Pt does not know the timing of his last tetanus.   Past Medical History:  Diagnosis Date  . GERD (gastroesophageal reflux disease)   . Hypertension   . Kidney stones    several episodes.  Last one in 2011.    . Obesity    100-lb weight loss since 2011.   . Polycythemia   . Sleep apnea    on Cpap    Patient Active Problem List   Diagnosis Date Noted  . PCP NOTES >>> 01/23/2015  . Abnormal TSH 10/05/2014  . Prediabetes 10/05/2014  . Obesity (BMI 30-39.9) 12/03/2012  . GERD (gastroesophageal reflux disease) 09/24/2012  . Skin lesion 09/24/2012  . IBS ? 04/22/2012  . Polycythemia   . Annual physical exam 06/05/2011  . Foot drop, right 06/05/2011  . BACK PAIN 02/15/2009  . Obesity 06/14/2006  . HYPERTENSION, BENIGN SYSTEMIC 06/14/2006  . RHINITIS, ALLERGIC 06/14/2006  . NEPHROLITHIASIS 06/14/2006  . OSA (obstructive sleep apnea) 06/14/2006    Past Surgical History:  Procedure  Laterality Date  . CHOLECYSTECTOMY    . HAND SURGERY  2010   R hand , d/t a injury, 3 surgeries        Home Medications    Prior to Admission medications   Medication Sig Start Date End Date Taking? Authorizing Provider  acetaminophen (TYLENOL) 325 MG tablet Take 650 mg by mouth every 6 (six) hours as needed for pain.     Historical Provider, MD  aspirin 81 MG tablet Take 81 mg by mouth daily.    Historical Provider, MD  Cyanocobalamin (B-12) 500 MCG TABS Take 1 tablet by mouth daily.    Historical Provider, MD  dicyclomine (BENTYL) 20 MG tablet Take 1 tablet (20 mg total) by mouth 3 (three) times daily before meals. 02/08/16   Colon Branch, MD  Flaxseed, Linseed, (FLAXSEED OIL) 1000 MG CAPS Take 1,000 mg by mouth daily.    Historical Provider, MD  Ginkgo 60 MG TABS Take 2 tablets by mouth daily.    Historical Provider, MD  Homeopathic Products (SINUS BUSTER NA) Place 1 puff into the nose daily as needed.    Historical Provider, MD  hydrochlorothiazide (HYDRODIURIL) 25 MG tablet Take 1 tablet (25 mg total) by mouth daily. 02/28/16   Colon Branch, MD  Multiple Vitamin (MULTIVITAMIN) tablet Take 1 tablet by mouth daily.     Historical Provider, MD  omeprazole (PRILOSEC) 40 MG capsule Take 1 capsule (40 mg  total) by mouth daily. 01/14/16   Colon Branch, MD    Family History Family History  Problem Relation Age of Onset  . Diabetes Mother   . Coronary artery disease Mother   . Stroke Father     M and F  . Hypertension Father   . Heart disease Sister     ?  Marland Kitchen Alcohol abuse Sister   . Colon cancer Sister 54    age ~ 57  . Prostate cancer Neg Hx     Social History Social History  Substance Use Topics  . Smoking status: Never Smoker  . Smokeless tobacco: Never Used  . Alcohol use No     Comment: none for 15 years     Allergies   Sulfa antibiotics   Review of Systems Review of Systems  Constitutional: Negative for chills and fever.  Gastrointestinal: Negative for nausea and  vomiting.  Skin: Positive for wound.  Neurological: Negative for weakness and numbness.     Physical Exam Updated Vital Signs BP 168/77 (BP Location: Right Arm)   Pulse 76   Temp 98.9 F (37.2 C) (Oral)   Resp 18   Ht 5\' 5"  (1.651 m)   Wt 102.5 kg   SpO2 100%   BMI 37.61 kg/m   Physical Exam  Constitutional: He is oriented to person, place, and time. He appears well-developed and well-nourished. No distress.  HENT:  Head: Normocephalic and atraumatic.  Eyes: Conjunctivae are normal.  Cardiovascular: Normal rate and intact distal pulses.   Pulmonary/Chest: Effort normal.  Abdominal: He exhibits no distension.  Neurological: He is alert and oriented to person, place, and time.  Strength and sensation intact bilaterally.   Skin: Skin is warm and dry.  2cm laceration to left second digit with no surrounding erythema.   Psychiatric: He has a normal mood and affect.  Nursing note and vitals reviewed.    ED Treatments / Results  DIAGNOSTIC STUDIES:  Oxygen Saturation is 100% on RA, normal by my interpretation.    COORDINATION OF CARE:  8:58 PM Discussed treatment plan with pt at bedside and pt agreed to plan.  Labs (all labs ordered are listed, but only abnormal results are displayed) Labs Reviewed - No data to display  EKG  EKG Interpretation None       Radiology No results found.  Procedures .Marland KitchenLaceration Repair Date/Time: 05/22/2016 10:22 PM Performed by: Bettey Costa Authorized by: Bettey Costa   Consent:    Consent obtained:  Verbal and written   Consent given by:  Patient   Risks discussed:  Infection, pain, poor cosmetic result and poor wound healing   Alternatives discussed:  No treatment and delayed treatment Anesthesia (see MAR for exact dosages):    Anesthesia method:  Local infiltration and nerve block   Local anesthetic:  Lidocaine 1% w/o epi   Block location:  Base of second digit of left hand   Block needle gauge:   25 G   Block anesthetic:  Lidocaine 1% w/o epi   Block technique:  Digital block   Block injection procedure:  Negative aspiration for blood, anatomic landmarks identified, anatomic landmarks palpated, introduced needle and incremental injection   Block outcome:  Anesthesia achieved Laceration details:    Location:  Finger   Finger location:  L index finger   Length (cm):  2   Depth (mm):  2 Repair type:    Repair type:  Simple Pre-procedure details:    Preparation:  Patient  was prepped and draped in usual sterile fashion Exploration:    Hemostasis achieved with:  Direct pressure   Wound exploration: wound explored through full range of motion and entire depth of wound probed and visualized     Wound extent: areolar tissue violated     Wound extent: no fascia violation noted, no foreign bodies/material noted, no muscle damage noted, no nerve damage noted, no tendon damage noted, no underlying fracture noted and no vascular damage noted     Contaminated: no   Treatment:    Area cleansed with:  Betadine and Shur-Clens   Amount of cleaning:  Standard   Irrigation solution:  Sterile saline   Irrigation volume:  50 cc   Irrigation method:  Pressure wash   Visualized foreign bodies/material removed: no   Skin repair:    Repair method:  Sutures   Suture size:  4-0   Suture material:  Prolene   Suture technique:  Simple interrupted   Number of sutures:  2 Approximation:    Approximation:  Close   Vermilion border: well-aligned   Post-procedure details:    Dressing:  Bulky dressing   Patient tolerance of procedure:  Tolerated well, no immediate complications   (including critical care time)  Medications Ordered in ED Medications  Tdap (BOOSTRIX) injection 0.5 mL (0.5 mLs Intramuscular Given 05/22/16 2112)  lidocaine (PF) (XYLOCAINE) 1 % injection 5 mL (5 mLs Infiltration Given 05/22/16 2113)     Initial Impression / Assessment and Plan / ED Course  I have reviewed the triage  vital signs and the nursing notes.  Pertinent labs & imaging results that were available during my care of the patient were reviewed by me and considered in my medical decision making (see chart for details).     Pressure irrigation performed. Wound explored and base of wound visualized in a bloodless field without evidence of foreign body.  Laceration occurred < 8 hours prior to repair which was well tolerated. Tdap given today.  Pt has no comorbidities to effect normal wound healing. Pt discharged without antibiotics.  Discussed suture home care with patient and answered questions. Pt to follow-up for wound check and suture removal in 10 days; they are to return to the ED sooner for signs of infection. Pt is hemodynamically stable with no complaints prior to dc.  Applied splint to affected left index finger prior to discharge.    Final Clinical Impressions(s) / ED Diagnoses   Final diagnoses:  Laceration of left index finger without foreign body without damage to nail, initial encounter    New Prescriptions Discharge Medication List as of 05/22/2016 10:33 PM    I personally performed the services described in this documentation, which was scribed in my presence. The recorded information has been reviewed and is accurate.    [place], [place] 05/23/16 [place], MD 05/23/16 [phone]

## 2016-05-22 NOTE — ED Triage Notes (Signed)
Pt was cutting a tennis ball for a walker for his son and cut his L pointer finger with the knife. Bleeding controlled. No blood thinners. Pt has a hx of HTN and is medicated.

## 2016-05-31 ENCOUNTER — Ambulatory Visit (INDEPENDENT_AMBULATORY_CARE_PROVIDER_SITE_OTHER): Payer: 59 | Admitting: Internal Medicine

## 2016-05-31 ENCOUNTER — Encounter: Payer: Self-pay | Admitting: Internal Medicine

## 2016-05-31 VITALS — BP 138/80 | HR 57 | Temp 98.0°F | Resp 14 | Ht 65.0 in | Wt 226.2 lb

## 2016-05-31 DIAGNOSIS — S6990XD Unspecified injury of unspecified wrist, hand and finger(s), subsequent encounter: Secondary | ICD-10-CM

## 2016-05-31 DIAGNOSIS — I10 Essential (primary) hypertension: Secondary | ICD-10-CM | POA: Diagnosis not present

## 2016-05-31 NOTE — Progress Notes (Signed)
Subjective:    Patient ID: Donald Jones, male    DOB: 08-15-1945, 71 y.o.   MRN: [id number]  DOS:  05/31/2016 Type of visit - description : ER follow-up Interval history: Martin Majestic to the ER 05/22/2016, had a cut to his left second digit with a knife at home. Got a tetanus shot, had a laceration repair . Here for stitches removal. Also concerned about his BP, days ago was in the 180s/115. He also thinks they BPs at different from one arm to the other    BP Readings from Last 3 Encounters:  05/31/16 138/80  05/22/16 168/77  02/22/16 124/80     Review of Systems No fever, chills. No discharge from the finger  Past Medical History:  Diagnosis Date  . GERD (gastroesophageal reflux disease)   . Hypertension   . Kidney stones    several episodes.  Last one in 2011.    . Obesity    100-lb weight loss since 2011.   . Polycythemia   . Sleep apnea    on Cpap    Past Surgical History:  Procedure Laterality Date  . CHOLECYSTECTOMY    . HAND SURGERY  2010   R hand , d/t a injury, 3 surgeries     Social History   Social History  . Marital status: Widowed    Spouse name: N/A  . Number of children: 3   . 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 Smoking status: Never Smoker  . Smokeless tobacco: Never Used  . Alcohol use No     Comment: none for 15 years  . Drug use: No  . Sexual activity: No   Other Topics Concern  . Not on file   Social History Narrative   1ST WIFE DIED FROM LUNG CANCER, 2ND WIFE DIED FROM RENAL CANCER.     Married x 4 , lives w/ wife      Allergies as of 05/31/2016      Reactions   Sulfa Antibiotics Nausea And Vomiting      Medication List       Accurate as of 05/31/16  3:06 PM. Always use your most recent med list.          acetaminophen 325 MG tablet Commonly known as:  TYLENOL Take 650 mg by mouth every 6 (six) hours as needed for pain.   aspirin 81 MG tablet Take 81 mg by  mouth daily.   B-12 500 MCG Tabs Take 1 tablet by mouth daily.   dicyclomine 20 MG tablet Commonly known as:  BENTYL Take 1 tablet (20 mg total) by mouth 3 (three) times daily before meals.   Flaxseed Oil 1000 MG Caps Take 1,000 mg by mouth daily.   Ginkgo 60 MG Tabs Take 2 tablets by mouth daily.   hydrochlorothiazide 25 MG tablet Commonly known as:  HYDRODIURIL Take 1 tablet (25 mg total) by mouth daily.   multivitamin tablet Take 1 tablet by mouth daily.   omeprazole 40 MG capsule Commonly known as:  PRILOSEC Take 1 capsule (40 mg total) by mouth daily.   SINUS BUSTER NA Place 1 puff into the nose daily as needed.          Objective:   Physical Exam BP 138/80 (BP Location: Right Arm, Patient Position: Sitting, Cuff Size: Normal)   Pulse (!) 57   Temp 98 F (36.7 C) (Oral)   Resp 14  Ht 5\' 5"  (1.651 m)   Wt 226 lb 4 oz (102.6 kg)   SpO2 97%   BMI 37.65 kg/m   General:   Well developed, well nourished . NAD.  HEENT:  Normocephalic . Face symmetric, atraumatic  MSK: Wound@ left second finger seems to be healing well, no redness, discharge, bleeding. Distal capillary refill normal. Good color and digit is warm. Neurologic:  alert & oriented X3.  Speech normal, gait appropriate for age and unassisted Psych--  Cognition and judgment appear intact.  Cooperative with normal attention span and concentration.  Behavior appropriate. No anxious or depressed appearing.      Assessment & Plan:  assessment  > DM  HTN Polycythemia- sees hematology, likely d/t OSA-diuretics, JAK2 (-) ~ 2014, last OV 10-2014, f/u prn OSA on CPAP DJD- saw Dr Rhona Raider before  R Foot drop  GERD IBS? GI sx on and off, previously dx w/ IBS Urolithiasis, several episodes Foot drop, right  PLAN: Injury, secondary finger: 2 stitches removed (apparently he had an additional stitch, it is gone). Rec to keep the area covered, clean ,dry.Area was redressed with a large gause and  tape. HTN: BP was elevated one time, in the 180s. Today is very good. Good compliance with medication. Also thinks that his BPs discrepant, we checked the BP R arm 138, on the left 126. Not a big difference. Will recheck in both arms on RTC. Continue monitoring  BPs at home

## 2016-05-31 NOTE — Progress Notes (Signed)
Pre visit review using our clinic review tool, if applicable. No additional management support is needed unless otherwise documented below in the visit note. 

## 2016-05-31 NOTE — Patient Instructions (Signed)
  Keep the  finger clean, dry and covered for few more days. Call if you are not gradually improving   Check the  blood pressure 2 or 3 times a   week   daily Be sure your blood pressure is between 110/65 and  145/85. If it is consistently higher or lower, let me know

## 2016-06-01 NOTE — Assessment & Plan Note (Signed)
Injury, secondary finger: 2 stitches removed (apparently he had an additional stitch, it is gone). Rec to keep the area covered, clean ,dry.Area was redressed with a large gause and tape. HTN: BP was elevated one time, in the 180s. Today is very good. Good compliance with medication. Also thinks that his BPs discrepant, we checked the BP R arm 138, on the left 126. Not a big difference. Will recheck in both arms on RTC. Continue monitoring  BPs at home

## 2016-08-21 ENCOUNTER — Ambulatory Visit (INDEPENDENT_AMBULATORY_CARE_PROVIDER_SITE_OTHER): Payer: 59 | Admitting: Internal Medicine

## 2016-08-21 ENCOUNTER — Encounter: Payer: Self-pay | Admitting: Internal Medicine

## 2016-08-21 VITALS — BP 128/78 | HR 79 | Temp 97.9°F | Resp 14 | Ht 65.0 in | Wt 222.4 lb

## 2016-08-21 DIAGNOSIS — R7303 Prediabetes: Secondary | ICD-10-CM | POA: Diagnosis not present

## 2016-08-21 DIAGNOSIS — I10 Essential (primary) hypertension: Secondary | ICD-10-CM | POA: Diagnosis not present

## 2016-08-21 DIAGNOSIS — D751 Secondary polycythemia: Secondary | ICD-10-CM

## 2016-08-21 DIAGNOSIS — L237 Allergic contact dermatitis due to plants, except food: Secondary | ICD-10-CM | POA: Diagnosis not present

## 2016-08-21 MED ORDER — OMEPRAZOLE 40 MG PO CPDR: 40.0000 mg | DELAYED_RELEASE_CAPSULE | Freq: Every day | ORAL | 3 refills | Status: DC

## 2016-08-21 MED ORDER — HYDROCORTISONE 2.5 % EX CREA: TOPICAL_CREAM | Freq: Two times a day (BID) | CUTANEOUS | 1 refills | Status: DC

## 2016-08-21 MED ORDER — ZOSTER VAC RECOMB ADJUVANTED 50 MCG/0.5ML IM SUSR: 0.5000 mL | Freq: Once | INTRAMUSCULAR | 1 refills | Status: AC

## 2016-08-21 NOTE — Patient Instructions (Signed)
GO TO THE LAB : Get the blood work     GO TO THE FRONT DESK Schedule your next appointment for a  Physical exam by 02-2017

## 2016-08-21 NOTE — Progress Notes (Signed)
Subjective:    Patient ID: Donald Jones, male    DOB: 1945-10-03, 71 y.o.   MRN: [id number]  DOS:  08/21/2016 Type of visit - description : rov Interval history: Good medication compliance, no apparent side effects, needs some refills. Was working on his son's yard few days ago, got poison ivy, wonders if I could prescribe a cream to have just in case. Overall itching has decreased.   Review of Systems Denies fever chills  Past Medical History:  Diagnosis Date  . GERD (gastroesophageal reflux disease)   . Hypertension   . Kidney stones    several episodes.  Last one in 2011.    . Obesity    100-lb weight loss since 2011.   . Polycythemia   . Sleep apnea    on Cpap    Past Surgical History:  Procedure Laterality Date  . CHOLECYSTECTOMY    . HAND SURGERY  2010   R hand , d/t a injury, 3 surgeries     Social History   Social History  . Marital status: Widowed    Spouse name: N/A  . Number of children: 3   . 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 Smoking status: Never Smoker  . Smokeless tobacco: Never Used  . Alcohol use No     Comment: none for 15 years  . Drug use: No  . Sexual activity: No   Other Topics Concern  . Not on file   Social History Narrative   1ST WIFE DIED FROM LUNG CANCER, 2ND WIFE DIED FROM RENAL CANCER.     Married x 4 , lives w/ wife      Allergies as of 08/21/2016      Reactions   Sulfa Antibiotics Nausea And Vomiting      Medication List       Accurate as of 08/21/16 11:59 PM. Always use your most recent med list.          acetaminophen 325 MG tablet Commonly known as:  TYLENOL Take 650 mg by mouth every 6 (six) hours as needed for pain.   aspirin 81 MG tablet Take 81 mg by mouth daily.   B-12 500 MCG Tabs Take 1 tablet by mouth daily.   dicyclomine 20 MG tablet Commonly known as:  BENTYL Take 1 tablet (20 mg total) by mouth 3 (three) times daily before  meals.   Flaxseed Oil 1000 MG Caps Take 1,000 mg by mouth daily.   Ginkgo 60 MG Tabs Take 2 tablets by mouth daily.   hydrochlorothiazide 25 MG tablet Commonly known as:  HYDRODIURIL Take 1 tablet (25 mg total) by mouth daily.   hydrocortisone 2.5 % cream Apply topically 2 (two) times daily.   multivitamin tablet Take 1 tablet by mouth daily.   omeprazole 40 MG capsule Commonly known as:  PRILOSEC Take 1 capsule (40 mg total) by mouth daily.   SINUS BUSTER NA Place 1 puff into the nose daily as needed.   Zoster Vac Recomb Adjuvanted injection Commonly known as:  SHINGRIX Inject 0.5 mLs into the muscle once.          Objective:   Physical Exam BP 128/78 (BP Location: Left Arm, Patient Position: Sitting, Cuff Size: Normal)   Pulse 79   Temp 97.9 F (36.6 C) (Oral)   Resp 14   Ht 5\' 5"  (1.651 m)   Wt 222 lb 6 oz (  100.9 kg)   SpO2 97%   BMI 37.01 kg/m  General:   Well developed, well nourished . NAD.  HEENT:  Normocephalic . Face symmetric, atraumatic Lungs:  CTA B Normal respiratory effort, no intercostal retractions, no accessory muscle use. Heart: RRR,  no murmur.  No pretibial edema bilaterally  Skin: Not pale. Not jaundice. Few scattered maculopapular erythematous areas betwen the elbows and wrists Neurologic:  alert & oriented X3.  Speech normal, gait appropriate for age , does have a dropfoot Psych--  Cognition and judgment appear intact.  Cooperative with normal attention span and concentration.  Behavior appropriate. No anxious or depressed appearing.      Assessment & Plan:   Assessment   DM  HTN Polycythemia- sees hematology, likely d/t OSA-diuretics, JAK2 (-) ~ 2014, last OV 10-2014, f/u prn OSA on CPAP DJD- saw Dr Rhona Raider before  R Foot drop  GERD IBS? GI sx on and off, previously dx w/ IBS Urolithiasis, several episodes Foot drop, right  PLAN: DM: Diet control, check a A1c HTN: On hydrochlorothiazide, checking a BMP History  of polycythemia: Check a CBC GERD: Refill PPIs, requests 90 day supply Poison ivy: Prevention discussed, prescribed hydrocortisone cream 2.5% as needed Primary care: Wonders about the new shingles shot, discussed, printed shingrex Rx (we are out of it today) to be used at his local pharmacy. RTC, CPX 02-2017

## 2016-08-21 NOTE — Progress Notes (Signed)
Pre visit review using our clinic review tool, if applicable. No additional management support is needed unless otherwise documented below in the visit note. 

## 2016-08-22 LAB — CBC WITH DIFFERENTIAL/PLATELET
BASOS PCT: 0.9 % (ref 0.0–3.0)
Basophils Absolute: 0.1 10*3/uL (ref 0.0–0.1)
EOS PCT: 3 % (ref 0.0–5.0)
Eosinophils Absolute: 0.3 10*3/uL (ref 0.0–0.7)
HCT: 48.4 % (ref 39.0–52.0)
Hemoglobin: 15.6 g/dL (ref 13.0–17.0)
LYMPHS ABS: 2.8 10*3/uL (ref 0.7–4.0)
Lymphocytes Relative: 31.3 % (ref 12.0–46.0)
MCHC: 32.3 g/dL (ref 30.0–36.0)
MCV: 83.8 fl (ref 78.0–100.0)
MONO ABS: 0.7 10*3/uL (ref 0.1–1.0)
MONOS PCT: 7.5 % (ref 3.0–12.0)
Neutro Abs: 5.2 10*3/uL (ref 1.4–7.7)
Neutrophils Relative %: 57.3 % (ref 43.0–77.0)
Platelets: 243 10*3/uL (ref 150.0–400.0)
RBC: 5.77 Mil/uL (ref 4.22–5.81)
RDW: 15.7 % — AB (ref 11.5–15.5)
WBC: 9.1 10*3/uL (ref 4.0–10.5)

## 2016-08-22 LAB — BASIC METABOLIC PANEL
BUN: 16 mg/dL (ref 6–23)
CO2: 31 mEq/L (ref 19–32)
Calcium: 9.9 mg/dL (ref 8.4–10.5)
Chloride: 102 mEq/L (ref 96–112)
Creatinine, Ser: 1.31 mg/dL (ref 0.40–1.50)
GFR: 57.71 mL/min — ABNORMAL LOW (ref >60.00)
Glucose, Bld: 99 mg/dL (ref 70–99)
POTASSIUM: 4.8 meq/L (ref 3.5–5.1)
SODIUM: 140 meq/L (ref 135–145)

## 2016-08-22 LAB — HEMOGLOBIN A1C: HEMOGLOBIN A1C: 7.1 % — AB (ref 4.6–6.5)

## 2016-08-22 NOTE — Assessment & Plan Note (Signed)
DM: Diet control, check a A1c HTN: On hydrochlorothiazide, checking a BMP History of polycythemia: Check a CBC GERD: Refill PPIs, requests 90 day supply Poison ivy: Prevention discussed, prescribed hydrocortisone cream 2.5% as needed Primary care: Wonders about the new shingles shot, discussed, printed shingrex Rx (we are out of it today) to be used at his local pharmacy. RTC, CPX 02-2017

## 2016-08-26 ENCOUNTER — Other Ambulatory Visit: Payer: Self-pay | Admitting: Internal Medicine

## 2016-10-30 ENCOUNTER — Ambulatory Visit (INDEPENDENT_AMBULATORY_CARE_PROVIDER_SITE_OTHER): Payer: 59 | Admitting: Internal Medicine

## 2016-10-30 ENCOUNTER — Encounter: Payer: Self-pay | Admitting: Internal Medicine

## 2016-10-30 VITALS — BP 136/70 | HR 51 | Temp 97.7°F | Resp 14 | Ht 65.0 in | Wt 223.5 lb

## 2016-10-30 DIAGNOSIS — R1032 Left lower quadrant pain: Secondary | ICD-10-CM | POA: Diagnosis not present

## 2016-10-30 LAB — CBC WITH DIFFERENTIAL/PLATELET
BASOS PCT: 0.5 % (ref 0.0–3.0)
Basophils Absolute: 0 10*3/uL (ref 0.0–0.1)
EOS PCT: 2.1 % (ref 0.0–5.0)
Eosinophils Absolute: 0.2 10*3/uL (ref 0.0–0.7)
HCT: 47.1 % (ref 39.0–52.0)
HEMOGLOBIN: 15.4 g/dL (ref 13.0–17.0)
LYMPHS ABS: 2.5 10*3/uL (ref 0.7–4.0)
Lymphocytes Relative: 31 % (ref 12.0–46.0)
MCHC: 32.8 g/dL (ref 30.0–36.0)
MCV: 84.3 fl (ref 78.0–100.0)
MONO ABS: 0.6 10*3/uL (ref 0.1–1.0)
Monocytes Relative: 7 % (ref 3.0–12.0)
NEUTROS PCT: 59.4 % (ref 43.0–77.0)
Neutro Abs: 4.8 10*3/uL (ref 1.4–7.7)
Platelets: 225 10*3/uL (ref 150.0–400.0)
RBC: 5.58 Mil/uL (ref 4.22–5.81)
RDW: 16.2 % — AB (ref 11.5–15.5)
WBC: 8 10*3/uL (ref 4.0–10.5)

## 2016-10-30 LAB — URINALYSIS, ROUTINE W REFLEX MICROSCOPIC
Bilirubin Urine: NEGATIVE
Hgb urine dipstick: NEGATIVE
KETONES UR: NEGATIVE
Leukocytes, UA: NEGATIVE
Nitrite: NEGATIVE
PH: 6 (ref 5.0–8.0)
RBC / HPF: NONE SEEN (ref 0–?)
SPECIFIC GRAVITY, URINE: 1.02 (ref 1.000–1.030)
Total Protein, Urine: NEGATIVE
Urine Glucose: NEGATIVE
Urobilinogen, UA: 0.2 (ref 0.0–1.0)
WBC UA: NONE SEEN (ref 0–?)

## 2016-10-30 MED ORDER — DICYCLOMINE HCL 20 MG PO TABS: 20.0000 mg | ORAL_TABLET | Freq: Three times a day (TID) | ORAL | 2 refills | Status: DC

## 2016-10-30 NOTE — Patient Instructions (Addendum)
GO TO THE LAB : Get the blood work and provide a urine sample    Drink plenty of fluids  Bentyl as needed for pain  Pepto-Bismol as needed for diarrhea.  Call if not gradually better in few days  Call if severe symptoms, fever, chills, blood in the stools

## 2016-10-30 NOTE — Progress Notes (Signed)
Pre visit review using our clinic review tool, if applicable. No additional management support is needed unless otherwise documented below in the visit note. 

## 2016-10-30 NOTE — Progress Notes (Signed)
Subjective:    Patient ID: Donald Jones, male    DOB: 1946-02-10, 71 y.o.   MRN: [id number]  DOS:  10/30/2016 Type of visit - description : Acute Interval history:  Symptoms started approximately a week ago with pain located on the LLQ  abdomen, pain is not radiating, on and off, described as a soreness or cramping. Yesterday, started with diarrhea described as loose, yellowish stools, several episodes, some today as well (two). Had these symptoms before, at some point was diagnosed with IBS  Review of Systems  No fever chills Have nausea yesterday but no vomiting. No constipation. No blood in the stools No dysuria or gross hematuria. Appetite remains normal.  Past Medical History:  Diagnosis Date  . GERD (gastroesophageal reflux disease)   . Hypertension   . Kidney stones    several episodes.  Last one in 2011.    . Obesity    100-lb weight loss since 2011.   . Polycythemia   . Sleep apnea    on Cpap    Past Surgical History:  Procedure Laterality Date  . CHOLECYSTECTOMY    . HAND SURGERY  2010   R hand , d/t a injury, 3 surgeries     Social History   Social History  . Marital status: Widowed    Spouse name: N/A  . Number of children: 3   . 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 Smoking status: Never Smoker  . Smokeless tobacco: Never Used  . Alcohol use No     Comment: none for 15 years  . Drug use: No  . Sexual activity: No   Other Topics Concern  . Not on file   Social History Narrative   1ST WIFE DIED FROM LUNG CANCER, 2ND WIFE DIED FROM RENAL CANCER.     Married x 4 , lives w/ wife      Allergies as of 10/30/2016      Reactions   Sulfa Antibiotics Nausea And Vomiting      Medication List       Accurate as of 10/30/16 11:59 PM. Always use your most recent med list.          acetaminophen 325 MG tablet Commonly known as:  TYLENOL Take 650 mg by mouth every 6 (six) hours as  needed for pain.   aspirin 81 MG tablet Take 81 mg by mouth daily.   B-12 500 MCG Tabs Take 1 tablet by mouth daily.   dicyclomine 20 MG tablet Commonly known as:  BENTYL Take 1 tablet (20 mg total) by mouth 3 (three) times daily before meals.   Flaxseed Oil 1000 MG Caps Take 1,000 mg by mouth daily.   Ginkgo 60 MG Tabs Take 2 tablets by mouth daily.   hydrochlorothiazide 25 MG tablet Commonly known as:  HYDRODIURIL Take 1 tablet (25 mg total) by mouth daily.   hydrocortisone 2.5 % cream Apply topically 2 (two) times daily.   multivitamin tablet Take 1 tablet by mouth daily.   omeprazole 40 MG capsule Commonly known as:  PRILOSEC Take 1 capsule (40 mg total) by mouth daily.   SINUS BUSTER NA Place 1 puff into the nose daily as needed.          Objective:   Physical Exam  Abdominal:     BP 136/70 (BP Location: Left Arm, Patient Position: Sitting, Cuff Size: Normal)   Pulse (!) 51  Temp 97.7 F (36.5 C) (Oral)   Resp 14   Ht 5\' 5"  (1.651 m)   Wt 223 lb 8 oz (101.4 kg)   SpO2 96%   BMI 37.19 kg/m  General:   Well developed, well nourished . NAD.  HEENT:  Normocephalic . Face symmetric, atraumatic. Not pale or jaundice. Lungs:  CTA B Normal respiratory effort, no intercostal retractions, no accessory muscle use. Heart: RRR,  no murmur.  no pretibial edema bilaterally  Abdomen:  Not distended, see graphic Skin: Not pale. Not jaundice Neurologic:  alert & oriented X3.  Speech normal, gait appropriate for age and unassisted Psych--  Cognition and judgment appear intact.  Cooperative with normal attention span and concentration.  Behavior appropriate. No anxious or depressed appearing.    Assessment & Plan:     Assessment   DM  HTN Polycythemia- sees hematology, likely d/t OSA-diuretics, JAK2 (-) ~ 2014, last OV 10-2014, f/u prn OSA on CPAP DJD- saw Dr Rhona Raider before  R Foot drop  GERD IBS? GI sx on and off, previously dx w/  IBS Urolithiasis, several episodes Foot drop, right  PLAN: LLQ abd pain: Pain started a week ago, developed diarrhea yesterday. No red flag sx, exam confirms discomfort in the area but no worrisome findings. IBS? Early diverticulitis? Pain related to acute diarrhea? Others? Plam: UA, CBC, urine culture. Conservative treatment with lots of fluids, Bentyl, Pepto-Bismol. If no better needs to let me know, consider further eval versus empiric treatment for possible diverticulitis. RTC, CPX 02-2017

## 2016-10-31 ENCOUNTER — Other Ambulatory Visit: Payer: Self-pay

## 2016-10-31 ENCOUNTER — Telehealth: Payer: Self-pay | Admitting: Internal Medicine

## 2016-10-31 LAB — URINE CULTURE: Organism ID, Bacteria: NO GROWTH

## 2016-10-31 MED ORDER — DICYCLOMINE HCL 20 MG PO TABS: 20.0000 mg | ORAL_TABLET | Freq: Three times a day (TID) | ORAL | 2 refills | Status: DC

## 2016-10-31 NOTE — Telephone Encounter (Signed)
Caller name: Lawerance Matsuo Relationship to patient:self Can be reached: [phone] Pharmacy: [facility], [place]  Reason for call: pt called pharmacy this morning and they did not receive. May be having trouble with e-scripts. Please call in per pt request.

## 2016-10-31 NOTE — Telephone Encounter (Signed)
Bentyl prescription printed and faxed to [facility].

## 2016-10-31 NOTE — Assessment & Plan Note (Signed)
LLQ abd pain: Pain started a week ago, developed diarrhea yesterday. No red flag sx, exam confirms discomfort in the area but no worrisome findings. IBS? Early diverticulitis? Pain related to acute diarrhea? Others? Plam: UA, CBC, urine culture. Conservative treatment with lots of fluids, Bentyl, Pepto-Bismol. If no better needs to let me know, consider further eval versus empiric treatment for possible diverticulitis. RTC, CPX 02-2017

## 2016-11-13 ENCOUNTER — Ambulatory Visit (HOSPITAL_COMMUNITY)
Admission: EM | Admit: 2016-11-13 | Discharge: 2016-11-13 | Disposition: A | Discharge status: Home / Self Care | Payer: 59 | Attending: Emergency Medicine | Admitting: Emergency Medicine

## 2016-11-13 ENCOUNTER — Encounter (HOSPITAL_COMMUNITY): Payer: Self-pay | Admitting: *Deleted

## 2016-11-13 DIAGNOSIS — R1032 Left lower quadrant pain: Secondary | ICD-10-CM | POA: Diagnosis not present

## 2016-11-13 DIAGNOSIS — R197 Diarrhea, unspecified: Secondary | ICD-10-CM

## 2016-11-13 LAB — POCT URINALYSIS DIP (DEVICE)
BILIRUBIN URINE: NEGATIVE
Glucose, UA: NEGATIVE mg/dL
HGB URINE DIPSTICK: NEGATIVE
KETONES UR: NEGATIVE mg/dL
LEUKOCYTES UA: NEGATIVE
NITRITE: NEGATIVE
PH: 5.5 (ref 5.0–8.0)
Protein, ur: NEGATIVE mg/dL
Specific Gravity, Urine: 1.025 (ref 1.005–1.030)
Urobilinogen, UA: 0.2 mg/dL (ref 0.0–1.0)

## 2016-11-13 MED ORDER — METRONIDAZOLE 500 MG PO TABS: 500.0000 mg | ORAL_TABLET | Freq: Two times a day (BID) | ORAL | 0 refills | Status: DC

## 2016-11-13 MED ORDER — CIPROFLOXACIN HCL 500 MG PO TABS
500.0000 mg | ORAL_TABLET | Freq: Two times a day (BID) | ORAL | 0 refills | Status: DC
Start: 2016-11-13 — End: 2016-11-17

## 2016-11-13 NOTE — Discharge Instructions (Signed)
Pt will need to have a ct scan to abd to rule out diverticulitis

## 2016-11-13 NOTE — ED Triage Notes (Addendum)
Went to  His  Dr      3   [place]  Ago  Was   Seen    For  Diverticulitis      He  Reports   l   Sided   Abdominal  Pain  And  Diarrhea            Off  And  On  X  3  Weeks  Pain  In l  Flank  As  Well   History  Of  Kidney  Stones  In past

## 2016-11-13 NOTE — ED Provider Notes (Signed)
CSN: [id number]     Arrival date & time 11/13/16  1532 History   First MD Initiated Contact with Patient 11/13/16 1658     Chief Complaint  Patient presents with  . Abdominal Pain   (Consider location/radiation/quality/duration/timing/severity/associated sxs/prior Treatment) Pt c/o LLQ abd pain for a few days now with loose bowels x4 today. States that he seen his pcp a few weeks ago for diverticulitis flair up and they said they would just watch and monitor. Thought that it was better but is not completley going away. Denies any n/v no fevers. States that the pain is radiating to LT flank. He has had an kidney stone in the past but wanted to rule this out before going to the hospital for a ct scan. Eating and drinking fluids. Has not taken anything PTA       Past Medical History:  Diagnosis Date  . GERD (gastroesophageal reflux disease)   . Hypertension   . Kidney stones    several episodes.  Last one in 2011.    . Obesity    100-lb weight loss since 2011.   . Polycythemia   . Sleep apnea    on Cpap   Past Surgical History:  Procedure Laterality Date  . CHOLECYSTECTOMY    . HAND SURGERY  2010   R hand , d/t a injury, 3 surgeries    Family History  Problem Relation Age of Onset  . Diabetes Mother   . Coronary artery disease Mother   . Stroke Father        M and F  . Hypertension Father   . Heart disease Sister        ?  Marland Kitchen Alcohol abuse Sister   . Colon cancer Sister 20       age ~ 29  . Prostate cancer Neg Hx    Social History  Substance Use Topics  . Smoking status: Never Smoker  . Smokeless tobacco: Never Used  . Alcohol use No     Comment: none for 15 years    Review of Systems  Constitutional: Negative.   Respiratory: Negative.   Cardiovascular: Negative.   Gastrointestinal: Positive for abdominal pain and diarrhea.  Endocrine: Negative.   Genitourinary: Negative.   Musculoskeletal: Negative.   Neurological: Negative.     Allergies  Sulfa  antibiotics  Home Medications   Prior to Admission medications   Medication Sig Start Date End Date Taking? Authorizing Provider  acetaminophen (TYLENOL) 325 MG tablet Take 650 mg by mouth every 6 (six) hours as needed for pain.     [provider]  aspirin 81 MG tablet Take 81 mg by mouth daily.    [provider]  Cyanocobalamin (B-12) 500 MCG TABS Take 1 tablet by mouth daily.    [provider]  dicyclomine (BENTYL) 20 MG tablet Take 1 tablet (20 mg total) by mouth 3 (three) times daily before meals. 10/31/16   Colon Branch, MD  Flaxseed, Linseed, (FLAXSEED OIL) 1000 MG CAPS Take 1,000 mg by mouth daily.    [provider]  Ginkgo 60 MG TABS Take 2 tablets by mouth daily.    [provider]  Homeopathic Products (SINUS BUSTER NA) Place 1 puff into the nose daily as needed.    [provider]  hydrochlorothiazide (HYDRODIURIL) 25 MG tablet Take 1 tablet (25 mg total) by mouth daily. 02/28/16   Colon Branch, MD  hydrocortisone 2.5 % cream Apply topically 2 (two) times daily.  08/21/16   Colon Branch, MD  Multiple Vitamin (MULTIVITAMIN) tablet Take 1 tablet by mouth daily.     [provider]  omeprazole (PRILOSEC) 40 MG capsule Take 1 capsule (40 mg total) by mouth daily. 08/21/16   Colon Branch, MD   Meds Ordered and Administered this Visit  Medications - No data to display  BP (!) 178/76 (BP Location: Left Arm) Comment: rn notified  Pulse 60   Temp 98.7 F (37.1 C) (Oral)   Resp 16   SpO2 100%  No data found.   Physical Exam  Constitutional: He appears well-developed.  Cardiovascular: Normal rate and regular rhythm.   Pulmonary/Chest: Effort normal and breath sounds normal.  Abdominal: Soft. Bowel sounds are normal.  Musculoskeletal: Normal range of motion.  Neurological: He is alert.  Skin: Skin is warm. Capillary refill takes less than 2 seconds.    Urgent Care Course     Procedures (including critical care  time)  Labs Review Labs Reviewed - No data to display  Imaging Review No results found.           MDM   1. Left lower quadrant pain   2. Diarrhea, unspecified type     Will need to be seen with pcp and have a ct scan for rule out of diverticulitis. Will start on abx regimen.  Follow up with your pcp    Melanee Left, NP 11/14/16 1147

## 2016-11-15 ENCOUNTER — Encounter (HOSPITAL_COMMUNITY): Payer: Self-pay | Admitting: Emergency Medicine

## 2016-11-15 ENCOUNTER — Telehealth: Payer: Self-pay | Admitting: Medical

## 2016-11-15 ENCOUNTER — Emergency Department (HOSPITAL_COMMUNITY): Payer: 59

## 2016-11-15 ENCOUNTER — Emergency Department (HOSPITAL_COMMUNITY)
Admission: EM | Admit: 2016-11-15 | Discharge: 2016-11-15 | Disposition: A | Discharge status: Home / Self Care | Payer: 59 | Attending: Emergency Medicine | Admitting: Emergency Medicine

## 2016-11-15 DIAGNOSIS — E876 Hypokalemia: Secondary | ICD-10-CM

## 2016-11-15 DIAGNOSIS — R109 Unspecified abdominal pain: Secondary | ICD-10-CM | POA: Diagnosis present

## 2016-11-15 DIAGNOSIS — Z79899 Other long term (current) drug therapy: Secondary | ICD-10-CM | POA: Diagnosis not present

## 2016-11-15 DIAGNOSIS — I10 Essential (primary) hypertension: Secondary | ICD-10-CM | POA: Insufficient documentation

## 2016-11-15 DIAGNOSIS — Z7982 Long term (current) use of aspirin: Secondary | ICD-10-CM | POA: Insufficient documentation

## 2016-11-15 DIAGNOSIS — M5136 Other intervertebral disc degeneration, lumbar region: Secondary | ICD-10-CM

## 2016-11-15 DIAGNOSIS — R197 Diarrhea, unspecified: Secondary | ICD-10-CM

## 2016-11-15 LAB — URINALYSIS, ROUTINE W REFLEX MICROSCOPIC
Bacteria, UA: NONE SEEN
Bilirubin Urine: NEGATIVE
Glucose, UA: NEGATIVE mg/dL
Hgb urine dipstick: NEGATIVE
KETONES UR: NEGATIVE mg/dL
Leukocytes, UA: NEGATIVE
Nitrite: NEGATIVE
PH: 5 (ref 5.0–8.0)
Protein, ur: NEGATIVE mg/dL
RBC / HPF: NONE SEEN RBC/hpf (ref 0–5)
SPECIFIC GRAVITY, URINE: 1.012 (ref 1.005–1.030)
SQUAMOUS EPITHELIAL / LPF: NONE SEEN
WBC, UA: NONE SEEN WBC/hpf (ref 0–5)

## 2016-11-15 LAB — COMPREHENSIVE METABOLIC PANEL
ALT: 33 U/L (ref 17–63)
AST: 31 U/L (ref 15–41)
Albumin: 4.4 g/dL (ref 3.5–5.0)
Alkaline Phosphatase: 62 U/L (ref 38–126)
Anion gap: 8 (ref 5–15)
BILIRUBIN TOTAL: 1.5 mg/dL — AB (ref 0.3–1.2)
BUN: 13 mg/dL (ref 6–20)
CHLORIDE: 100 mmol/L — AB (ref 101–111)
CO2: 26 mmol/L (ref 22–32)
CREATININE: 1.32 mg/dL — AB (ref 0.61–1.24)
Calcium: 9.4 mg/dL (ref 8.9–10.3)
GFR calc Af Amer: >60 mL/min (ref >60)
GFR, EST NON AFRICAN AMERICAN: 54 mL/min — AB (ref >60)
GLUCOSE: 121 mg/dL — AB (ref 65–99)
POTASSIUM: 3.4 mmol/L — AB (ref 3.5–5.1)
Sodium: 134 mmol/L — ABNORMAL LOW (ref 135–145)
Total Protein: 7.3 g/dL (ref 6.5–8.1)

## 2016-11-15 LAB — CBC
HEMATOCRIT: 46.6 % (ref 39.0–52.0)
Hemoglobin: 15.6 g/dL (ref 13.0–17.0)
MCH: 27.3 pg (ref 26.0–34.0)
MCHC: 33.5 g/dL (ref 30.0–36.0)
MCV: 81.6 fL (ref 78.0–100.0)
PLATELETS: 242 10*3/uL (ref 150–400)
RBC: 5.71 MIL/uL (ref 4.22–5.81)
RDW: 14.9 % (ref 11.5–15.5)
WBC: 6.9 10*3/uL (ref 4.0–10.5)

## 2016-11-15 LAB — LIPASE, BLOOD: LIPASE: 27 U/L (ref 11–51)

## 2016-11-15 MED ORDER — POTASSIUM CHLORIDE CRYS ER 20 MEQ PO TBCR
40.0000 meq | EXTENDED_RELEASE_TABLET | Freq: Once | ORAL | Status: AC
  Administered 2016-11-15: 40 meq via ORAL
  Filled 2016-11-15: qty 2

## 2016-11-15 MED ORDER — TRAMADOL HCL 50 MG PO TABS: 50.0000 mg | ORAL_TABLET | Freq: Four times a day (QID) | ORAL | 0 refills | Status: DC | PRN

## 2016-11-15 MED ORDER — TRAMADOL HCL 50 MG PO TABS
50.0000 mg | ORAL_TABLET | Freq: Once | ORAL | Status: AC
  Administered 2016-11-15: 50 mg via ORAL
  Filled 2016-11-15: qty 1

## 2016-11-15 NOTE — ED Triage Notes (Signed)
Pt presents with LLQ abd pain for couple days with darker than usual stools; pt reports "been told it could be diverticulosis";

## 2016-11-15 NOTE — Discharge Instructions (Signed)
It was our pleasure to provide your ER care today - we hope that you feel better.  The ct scan was read as showing no acute process.   Note was made of degenerative disc changes in your lower back, which could cause nerve impingement and your pain, pain with trying to stand/straighten back up, etc.   Since there is no indication of diverticulitis, you may stop the antibiotic.  For diarrhea, drink plenty of fluids. You may try imodium.   Take motrin or aleve as need for pain.   You may also take ultram as need for pain - no driving for the next 4 hours, or when taking ultram.   Follow up with primary care doctor in the next couple weeks.  Also have your blood pressure rechecked then as it is high today.   Your potassium level is slightly low (3.4) - eat plenty of fruits and vegetables, and follow up with your doctor.   Return to ER if worse, new symptoms, intractable pain, other concern.

## 2016-11-15 NOTE — Telephone Encounter (Signed)
Appointment with me on Friday should be 30 minute. He is actually in ED now. Saw Dr. [name] 2 weeks ago. May have gone to UC an now in ED. Looks like cause of pain difficult to find. So need 30 minutes rather than 15.

## 2016-11-15 NOTE — Telephone Encounter (Signed)
Done

## 2016-11-15 NOTE — ED Provider Notes (Signed)
[facility] DEPT Provider Note   CSN: [id number] Arrival date & time: 11/15/16  4166     History   Chief Complaint Chief Complaint  Patient presents with  . Abdominal Pain    HPI Donald Jones is a 71 y.o. male.  Pt c/o left flank pain for past 2 weeks, gradual onset, constant, moderate, waxes/wanes intensity. No radiation to groin or leg. Hx irritable bowel, kidney stones and diverticula.  Was placed on cipro and flagyl 2 days ago for possible diverticulitis. Denies hematuria or dysuria. No fever or chills. States yesterday bent over, pain was worse and painful to straighten back up. Normal appetite. +few episodes diarrhea, had 2 loose stools today.    The history is provided by the patient.    Past Medical History:  Diagnosis Date  . GERD (gastroesophageal reflux disease)   . Hypertension   . Kidney stones    several episodes.  Last one in 2011.    . Obesity    100-lb weight loss since 2011.   . Polycythemia   . Sleep apnea    on Cpap    Patient Active Problem List   Diagnosis Date Noted  . PCP NOTES >>> 01/23/2015  . Abnormal TSH 10/05/2014  . Prediabetes 10/05/2014  . Obesity (BMI 30-39.9) 12/03/2012  . GERD (gastroesophageal reflux disease) 09/24/2012  . Skin lesion 09/24/2012  . IBS ? 04/22/2012  . Polycythemia   . Annual physical exam 06/05/2011  . Foot drop, right 06/05/2011  . BACK PAIN 02/15/2009  . Obesity 06/14/2006  . HYPERTENSION, BENIGN SYSTEMIC 06/14/2006  . RHINITIS, ALLERGIC 06/14/2006  . NEPHROLITHIASIS 06/14/2006  . OSA (obstructive sleep apnea) 06/14/2006    Past Surgical History:  Procedure Laterality Date  . CHOLECYSTECTOMY    . HAND SURGERY  2010   R hand , d/t a injury, 3 surgeries        Home Medications    Prior to Admission medications   Medication Sig Start Date End Date Taking? Authorizing Provider  acetaminophen (TYLENOL) 325 MG tablet Take 650 mg by mouth every 6 (six) hours as needed for pain.     [provider]  aspirin 81 MG tablet Take 81 mg by mouth daily.    [provider]  ciprofloxacin (CIPRO) 500 MG tablet Take 1 tablet (500 mg total) by mouth 2 (two) times daily. 11/13/16   Melanee Left, NP  Cyanocobalamin (B-12) 500 MCG TABS Take 1 tablet by mouth daily.    [provider]  dicyclomine (BENTYL) 20 MG tablet Take 1 tablet (20 mg total) by mouth 3 (three) times daily before meals. 10/31/16   Colon Branch, MD  Flaxseed, Linseed, (FLAXSEED OIL) 1000 MG CAPS Take 1,000 mg by mouth daily.    [provider]  Ginkgo 60 MG TABS Take 2 tablets by mouth daily.    [provider]  Homeopathic Products (SINUS BUSTER NA) Place 1 puff into the nose daily as needed.    [provider]  hydrochlorothiazide (HYDRODIURIL) 25 MG tablet Take 1 tablet (25 mg total) by mouth daily. 02/28/16   Colon Branch, MD  hydrocortisone 2.5 % cream Apply topically 2 (two) times daily. 08/21/16   Colon Branch, MD  metroNIDAZOLE (FLAGYL) 500 MG tablet Take 1 tablet (500 mg total) by mouth 2 (two) times daily. 11/13/16   Melanee Left, NP  Multiple Vitamin (MULTIVITAMIN) tablet Take 1 tablet by mouth daily.     [provider]  omeprazole (PRILOSEC) 40 MG capsule Take 1 capsule (40 mg total) by mouth daily. 08/21/16   Colon Branch, MD    Family History Family History  Problem Relation Age of Onset  . Diabetes Mother   . Coronary artery disease Mother   . Stroke Father        M and F  . Hypertension Father   . Heart disease Sister        ?  Marland Kitchen Alcohol abuse Sister   . Colon cancer Sister 73       age ~ 9  . Prostate cancer Neg Hx     Social History Social History  Substance Use Topics  . Smoking status: Never Smoker  . Smokeless tobacco: Never Used  . Alcohol use No     Comment: none for 15 years     Allergies   Sulfa antibiotics   Review of Systems Review of Systems  Constitutional: Negative for fever.  HENT: Negative for sore  throat.   Eyes: Negative for redness.  Respiratory: Negative for shortness of breath.   Cardiovascular: Negative for chest pain.  Gastrointestinal: Positive for abdominal pain and diarrhea.  Endocrine: Negative for polyuria.  Genitourinary: Positive for flank pain. Negative for dysuria, hematuria and testicular pain.  Musculoskeletal: Positive for back pain. Negative for neck pain.  Skin: Negative for rash.  Neurological: Negative for headaches.  Hematological: Does not bruise/bleed easily.  Psychiatric/Behavioral: Negative for confusion.     Physical Exam Updated Vital Signs BP (!) 176/86 (BP Location: Right Arm)   Pulse (!) 58   Temp 98.1 F (36.7 C) (Oral)   Resp 16   Ht 1.651 m (5\' 5" )   Wt 102.5 kg (226 lb)   SpO2 99%   BMI 37.61 kg/m   Physical Exam  Constitutional: He is oriented to person, place, and time. He appears well-developed and well-nourished. No distress.  HENT:  Mouth/Throat: Oropharynx is clear and moist.  Eyes: Conjunctivae are normal.  Neck: Neck supple. No tracheal deviation present.  Cardiovascular: Normal rate, regular rhythm, normal heart sounds and intact distal pulses.   Pulmonary/Chest: Effort normal and breath sounds normal. No accessory muscle usage. No respiratory distress.  Abdominal: Soft. Bowel sounds are normal. He exhibits no distension and no mass. There is no tenderness. There is no rebound and no guarding. No hernia.  Genitourinary:  Genitourinary Comments: No cva tenderness. No scrotal/testicular pain/tenderness.  Musculoskeletal: He exhibits no edema.  Neurological: He is alert and oriented to person, place, and time.  Skin: Skin is warm and dry. No rash noted. He is not diaphoretic.  No shingles/rash to area of pain  Psychiatric: He has a normal mood and affect.  Nursing note and vitals reviewed.    ED Treatments / Results  Labs (all labs ordered are listed, but only abnormal results are displayed) Results for orders placed  or performed during the hospital encounter of 11/15/16  Lipase, blood  Result Value Ref Range   Lipase 27 11 - 51 U/L  Comprehensive metabolic panel  Result Value Ref Range   Sodium 134 (L) 135 - 145 mmol/L   Potassium 3.4 (L) 3.5 - 5.1 mmol/L   Chloride 100 (L) 101 - 111 mmol/L   CO2 26 22 - 32 mmol/L   Glucose, Bld 121 (H) 65 - 99 mg/dL   BUN 13 6 - 20 mg/dL   Creatinine, Ser 1.32 (H) 0.61 - 1.24 mg/dL   Calcium 9.4 8.9 - 10.3 mg/dL  Total Protein 7.3 6.5 - 8.1 g/dL   Albumin 4.4 3.5 - 5.0 g/dL   AST 31 15 - 41 U/L   ALT 33 17 - 63 U/L   Alkaline Phosphatase 62 38 - 126 U/L   Total Bilirubin 1.5 (H) 0.3 - 1.2 mg/dL   GFR calc non Af Amer 54 (L) >60 mL/min   GFR calc Af Amer >60 >60 mL/min   Anion gap 8 5 - 15  CBC  Result Value Ref Range   WBC 6.9 4.0 - 10.5 K/uL   RBC 5.71 4.22 - 5.81 MIL/uL   Hemoglobin 15.6 13.0 - 17.0 g/dL   HCT 46.6 39.0 - 52.0 %   MCV 81.6 78.0 - 100.0 fL   MCH 27.3 26.0 - 34.0 pg   MCHC 33.5 30.0 - 36.0 g/dL   RDW 14.9 11.5 - 15.5 %   Platelets 242 150 - 400 K/uL  Urinalysis, Routine w reflex microscopic  Result Value Ref Range   Color, Urine YELLOW YELLOW   APPearance CLEAR CLEAR   Specific Gravity, Urine 1.012 1.005 - 1.030   pH 5.0 5.0 - 8.0   Glucose, UA NEGATIVE NEGATIVE mg/dL   Hgb urine dipstick NEGATIVE NEGATIVE   Bilirubin Urine NEGATIVE NEGATIVE   Ketones, ur NEGATIVE NEGATIVE mg/dL   Protein, ur NEGATIVE NEGATIVE mg/dL   Nitrite NEGATIVE NEGATIVE   Leukocytes, UA NEGATIVE NEGATIVE   RBC / HPF NONE SEEN 0 - 5 RBC/hpf   WBC, UA NONE SEEN 0 - 5 WBC/hpf   Bacteria, UA NONE SEEN NONE SEEN   Squamous Epithelial / LPF NONE SEEN NONE SEEN   Mucous PRESENT     EKG  EKG Interpretation None       Radiology Ct Renal Stone Study  Result Date: 11/15/2016 CLINICAL DATA:  Left flank pain and nausea. History of kidney stones. EXAM: CT ABDOMEN AND PELVIS WITHOUT CONTRAST TECHNIQUE: Multidetector CT imaging of the abdomen and pelvis  was performed following the standard protocol without IV contrast. COMPARISON:  CT abdomen and pelvis dated February 14, 2012. FINDINGS: Lower chest: No acute abnormality.  Bilateral gynecomastia. Hepatobiliary: Hepatic steatosis. Status post cholecystectomy. No biliary dilatation. Pancreas: Moderate fatty atrophy. No ductal dilatation or surrounding inflammatory changes. Spleen: Normal in size without focal abnormality. Adrenals/Urinary Tract: Adrenal glands are unremarkable. Bilateral renal cortical scarring. No renal calculi, focal lesion, or hydronephrosis. Bladder is unremarkable. Stomach/Bowel: Stomach is within normal limits. Appendix appears normal. No evidence of bowel wall thickening, distention, or inflammatory changes. Vascular/Lymphatic: Aortic atherosclerosis. No enlarged abdominal or pelvic lymph nodes. Reproductive: Mild prostatomegaly, unchanged. Other: No abdominal wall hernia or abnormality. No abdominopelvic ascites. Musculoskeletal: Degenerative changes of the thoracolumbar spine, worst at L4-L5 and L5-S1, similar to prior study. No acute osseous abnormality. IMPRESSION: 1. No acute intra-abdominal process. No evidence of nephrolithiasis. No hydronephrosis. 2. Hepatic steatosis. 3.  Aortic Atherosclerosis (ICD10-I70.0). Electronically Signed   By: Titus Dubin M.D.   On: 11/15/2016 08:12    Procedures Procedures (including critical care time)  Medications Ordered in ED Medications  traMADol (ULTRAM) tablet 50 mg (not administered)     Initial Impression / Assessment and Plan / ED Course  I have reviewed the triage vital signs and the nursing notes.  Pertinent labs & imaging results that were available during my care of the patient were reviewed by me and considered in my medical decision making (see chart for details).  Pt requests ct.   Reviewed nursing notes and prior charts for additional  history. pts pcp and uc notes mention ct to better define pts symptoms.  Labs.   Imaging.  Based on aspects of hx (difficult/painful to straighten up post bending) ?whether possible musculoskeletal/nerve impingement/ddd related.  Ultram for pain.   Final Clinical Impressions(s) / ED Diagnoses   Final diagnoses:  None    New Prescriptions New Prescriptions   No medications on file     Lajean Saver, MD 11/15/16 [phone]

## 2016-11-17 ENCOUNTER — Ambulatory Visit (INDEPENDENT_AMBULATORY_CARE_PROVIDER_SITE_OTHER): Payer: 59 | Admitting: Internal Medicine

## 2016-11-17 ENCOUNTER — Encounter: Payer: Self-pay | Admitting: Internal Medicine

## 2016-11-17 ENCOUNTER — Ambulatory Visit: Payer: 59 | Admitting: Medical

## 2016-11-17 VITALS — BP 138/88 | HR 59 | Temp 97.9°F | Resp 14 | Ht 65.0 in | Wt 224.2 lb

## 2016-11-17 DIAGNOSIS — M545 Low back pain: Secondary | ICD-10-CM | POA: Diagnosis not present

## 2016-11-17 DIAGNOSIS — R1032 Left lower quadrant pain: Secondary | ICD-10-CM

## 2016-11-17 MED ORDER — CYCLOBENZAPRINE HCL 10 MG PO TABS: 10.0000 mg | ORAL_TABLET | Freq: Every evening | ORAL | 0 refills | Status: DC | PRN

## 2016-11-17 NOTE — Progress Notes (Signed)
Subjective:    Patient ID: Donald Jones, male    DOB: 09/06/1945, 71 y.o.   MRN: [id number]  DOS:  11/17/2016 Type of visit - description : acute Interval history: Since the last office visit, she continue with abdominal pain. Went to urgent care, was prescribed antibiotics for presumed diverticulitis, took one or 2 doses and then quit. Eventually went to the ER 11/15/2016 Workup as follows: Lipase negative, CMP essentially normal, potassium is slightly low. CBC with elevated white count, urine negative. CT renal stone study: No acute processes.  While he was at the ER, he figured out that this pain is somewhat similar to a problem he had in the 1980s: Left back pain with radiation to the leg but also some radiation anteriorly to the abdomen. He thinks that is his problem at this point. On further observation, he noticed left back pain mostly when he bends at work, has been taking Motrin. He was also prescribed tramadol which helped but he doesn't like to take  Review of Systems Denies fever chills Had diarrhea, resolved. Appetite is normal.   Past Medical History:  Diagnosis Date  . GERD (gastroesophageal reflux disease)   . Hypertension   . Kidney stones    several episodes.  Last one in 2011.    . Obesity    100-lb weight loss since 2011.   . Polycythemia   . Sleep apnea    on Cpap    Past Surgical History:  Procedure Laterality Date  . CHOLECYSTECTOMY    . HAND SURGERY  2010   R hand , d/t a injury, 3 surgeries     Social History   Social History  . Marital status: Married    Spouse name: N/A  . Number of children: 3   . 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 Smoking status: Never Smoker  . Smokeless tobacco: Never Used  . Alcohol use No     Comment: none for 15 years  . Drug use: No  . Sexual activity: No   Other Topics Concern  . Not on file   Social History Narrative   1ST WIFE DIED  FROM LUNG CANCER, 2ND WIFE DIED FROM RENAL CANCER.     Married x 4 , lives w/ wife      Allergies as of 11/17/2016      Reactions   Sulfa Antibiotics Nausea And Vomiting      Medication List       Accurate as of 11/17/16 11:59 PM. Always use your most recent med list.          acetaminophen 325 MG tablet Commonly known as:  TYLENOL Take 650 mg by mouth every 6 (six) hours as needed for pain.   aspirin 81 MG tablet Take 81 mg by mouth daily.   B-12 500 MCG Tabs Take 1 tablet by mouth daily.   cyclobenzaprine 10 MG tablet Commonly known as:  FLEXERIL Take 1 tablet (10 mg total) by mouth at bedtime as needed for muscle spasms.   dicyclomine 20 MG tablet Commonly known as:  BENTYL Take 1 tablet (20 mg total) by mouth 3 (three) times daily before meals.   Flaxseed Oil 1000 MG Caps Take 1,000 mg by mouth daily.   Ginkgo 60 MG Tabs Take 2 tablets by mouth daily.   hydrochlorothiazide 25 MG tablet Commonly known as:  HYDRODIURIL Take 1 tablet (25 mg total)  by mouth daily.   hydrocortisone 2.5 % cream Apply topically 2 (two) times daily.   multivitamin tablet Take 1 tablet by mouth daily.   omeprazole 40 MG capsule Commonly known as:  PRILOSEC Take 1 capsule (40 mg total) by mouth daily.   SINUS BUSTER NA Place 1 puff into the nose daily as needed.   traMADol 50 MG tablet Commonly known as:  ULTRAM Take 1 tablet (50 mg total) by mouth every 6 (six) hours as needed.          Objective:   Physical Exam BP 138/88 (BP Location: Left Arm, Patient Position: Sitting, Cuff Size: Normal)   Pulse (!) 59   Temp 97.9 F (36.6 C) (Oral)   Resp 14   Ht 5\' 5"  (1.651 m)   Wt 224 lb 4 oz (101.7 kg)   SpO2 97%   BMI 37.32 kg/m  General:   Well developed, well nourished . NAD.  HEENT:  Normocephalic . Face symmetric, atraumatic   Abdomen:  Not distended, soft, non-tender. No rebound or rigidity.  Skin: Not pale. Not jaundice Neurologic:  alert & oriented X3.    Speech normal, gait appropriate for age and unassisted Straight leg test on the left: Increased pain in the low back but no radiculopathy. Psych--  Cognition and judgment appear intact.  Cooperative with normal attention span and concentration.  Behavior appropriate. No anxious or depressed appearing.    Assessment & Plan:    Assessment   DM  HTN Polycythemia- sees hematology, likely d/t OSA-diuretics, JAK2 (-) ~ 2014, last OV 10-2014, f/u prn OSA on CPAP DJD- saw Dr Rhona Raider before  R Foot drop  GERD IBS? GI sx on and off, previously dx w/ IBS Urolithiasis, several episodes   PLAN: LLQ abd pain: A UTI, stone or diverticulitis havebeen effectively ruled out. Pain resembles previous episode of back pain with radiation to the abdomen. It is possible that in fact the abdominal pain is coming from the back. Plan:  Ortho referral, heating pad, Flexeril and Tylenol. Stop Motrin due to potential for side effects, patient self DC Ultram.  Call if no better   Today, I spent more than  25  min with the patient: >50% of the time counseling regards Treatment options for what seems to be a Amy skin issue. Also discussed some GI side effects of Motrin and reviewing the chart and labs ordered by other providers

## 2016-11-17 NOTE — Progress Notes (Signed)
Pre visit review using our clinic review tool, if applicable. No additional management support is needed unless otherwise documented below in the visit note. 

## 2016-11-17 NOTE — Patient Instructions (Addendum)
Tylenol  500 mg OTC 2 tabs a day every 8 hours as needed for pain  Flexeril  at night only  Heating pad

## 2016-11-20 NOTE — Assessment & Plan Note (Signed)
LLQ abd pain: A UTI, stone or diverticulitis havebeen effectively ruled out. Pain resembles previous episode of back pain with radiation to the abdomen. It is possible that in fact the abdominal pain is coming from the back. Plan:  Ortho referral, heating pad, Flexeril and Tylenol. Stop Motrin due to potential for side effects, patient self DC Ultram.  Call if no better

## 2016-12-20 ENCOUNTER — Telehealth: Payer: Self-pay | Admitting: Internal Medicine

## 2016-12-20 DIAGNOSIS — Z0000 Encounter for general adult medical examination without abnormal findings: Secondary | ICD-10-CM

## 2016-12-20 NOTE — Telephone Encounter (Signed)
Please advise 

## 2016-12-20 NOTE — Telephone Encounter (Signed)
Pt has his cpe scheduled for 01/29/17 at 1:15p. Pt would like to come in the day before to have his labs    Could orders be placed for scheduling

## 2016-12-21 NOTE — Telephone Encounter (Signed)
Lab orders placed. Please schedule fasting lab appt at Pt's convenience. Thank you.

## 2016-12-21 NOTE — Telephone Encounter (Signed)
Arrange for FLP, A1c, TSH, BMP to be done few days before the physical exam

## 2017-01-26 ENCOUNTER — Other Ambulatory Visit (INDEPENDENT_AMBULATORY_CARE_PROVIDER_SITE_OTHER): Payer: 59

## 2017-01-26 DIAGNOSIS — Z0000 Encounter for general adult medical examination without abnormal findings: Secondary | ICD-10-CM

## 2017-01-26 LAB — LIPID PANEL
CHOLESTEROL: 160 mg/dL (ref 0–200)
HDL: 42.3 mg/dL (ref >39.00)
LDL CALC: 106 mg/dL — AB (ref 0–99)
NonHDL: 117.91
Total CHOL/HDL Ratio: 4
Triglycerides: 58 mg/dL (ref 0.0–149.0)
VLDL: 11.6 mg/dL (ref 0.0–40.0)

## 2017-01-26 LAB — BASIC METABOLIC PANEL
BUN: 15 mg/dL (ref 6–23)
CHLORIDE: 101 meq/L (ref 96–112)
CO2: 30 mEq/L (ref 19–32)
Calcium: 8.7 mg/dL (ref 8.4–10.5)
Creatinine, Ser: 1.27 mg/dL (ref 0.40–1.50)
GFR: 59.73 mL/min — AB (ref >60.00)
Glucose, Bld: 116 mg/dL — ABNORMAL HIGH (ref 70–99)
POTASSIUM: 4.4 meq/L (ref 3.5–5.1)
SODIUM: 138 meq/L (ref 135–145)

## 2017-01-26 LAB — TSH: TSH: 0.61 u[IU]/mL (ref 0.35–4.50)

## 2017-01-26 LAB — HEMOGLOBIN A1C: HEMOGLOBIN A1C: 7 % — AB (ref 4.6–6.5)

## 2017-01-29 ENCOUNTER — Ambulatory Visit (INDEPENDENT_AMBULATORY_CARE_PROVIDER_SITE_OTHER): Payer: 59 | Admitting: Internal Medicine

## 2017-01-29 ENCOUNTER — Encounter: Payer: Self-pay | Admitting: Internal Medicine

## 2017-01-29 VITALS — BP 124/72 | HR 55 | Temp 97.5°F | Resp 14 | Ht 65.0 in | Wt 222.5 lb

## 2017-01-29 DIAGNOSIS — Z23 Encounter for immunization: Secondary | ICD-10-CM

## 2017-01-29 DIAGNOSIS — E119 Type 2 diabetes mellitus without complications: Secondary | ICD-10-CM | POA: Diagnosis not present

## 2017-01-29 DIAGNOSIS — Z0000 Encounter for general adult medical examination without abnormal findings: Secondary | ICD-10-CM | POA: Diagnosis not present

## 2017-01-29 MED ORDER — METFORMIN HCL 500 MG PO TABS: 500.0000 mg | ORAL_TABLET | Freq: Two times a day (BID) | ORAL | 3 refills | Status: DC

## 2017-01-29 NOTE — Patient Instructions (Addendum)
GO TO THE LAB : Provide a urine sample   GO TO THE FRONT DESK Schedule your next appointment for a  checkup in 3 months  Start metformin 500 mg: The first week take 1 tablet with breakfast After one week take 1 tablet twice a day with breakfast and dinner  For pain: -Tylenol  500 mg OTC 2 tabs a day every 8 hours as needed  -IBUPROFEN (Advil or Motrin) 200 mg 2 tablets every 12 hours as needed .  Always take it with food because may cause gastritis and ulcers.  If you notice nausea, stomach pain, change in the color of stools --->  Stop the medicine and let us know

## 2017-01-29 NOTE — Progress Notes (Signed)
Pre visit review using our clinic review tool, if applicable. No additional management support is needed unless otherwise documented below in the visit note. 

## 2017-01-29 NOTE — Progress Notes (Signed)
Subjective:    Patient ID: Donald Jones, male    DOB: [birth year], 71 y.o.   MRN: [id number]  DOS:  01/29/2017 Type of visit - description : cpx Interval history: No major concerns Has DJD, worse at the left knee, saw ortho  Review of Systems See last visit, LLQ abdominal pain resolved. Denies lower extremity paresthesias  Other than above, a 14 point review of systems is negative     Past Medical History:  Diagnosis Date  . GERD (gastroesophageal reflux disease)   . Hypertension   . Kidney stones    several episodes.  Last one in 2011.    . Obesity    100-lb weight loss since 2011.   . Polycythemia   . Sleep apnea    on Cpap    Past Surgical History:  Procedure Laterality Date  . CHOLECYSTECTOMY    . HAND SURGERY  2010   R hand , d/t a injury, 3 surgeries     Social History   Social History  . Marital status: Married    Spouse name: N/A  . Number of children: 3   . 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 Smoking status: Never Smoker  . Smokeless tobacco: Never Used  . Alcohol use No     Comment: none for 15 years  . Drug use: No  . Sexual activity: No   Other Topics Concern  . Not on file   Social History Narrative   1ST WIFE DIED FROM LUNG CANCER, 2ND WIFE DIED FROM RENAL CANCER.     Married x 4 , lives w/ wife   3 sisters and 3 brother : lost 2 sisters      Family History  Problem Relation Age of Onset  . Diabetes Mother   . Coronary artery disease Mother   . Stroke Father        M and F  . Hypertension Father   . Heart disease Sister        ?  Marland Kitchen Alcohol abuse Sister   . Colon cancer Sister 49       age ~ 29  . Prostate cancer Neg Hx      Allergies as of 01/29/2017      Reactions   Sulfa Antibiotics Nausea And Vomiting      Medication List       Accurate as of 01/29/17  4:55 PM. Always use your most recent med list.          acetaminophen 325 MG tablet Commonly  known as:  TYLENOL Take 650 mg by mouth every 6 (six) hours as needed for pain.   aspirin 81 MG tablet Take 81 mg by mouth daily.   B-12 500 MCG Tabs Take 1 tablet by mouth daily.   cyclobenzaprine 10 MG tablet Commonly known as:  FLEXERIL Take 1 tablet (10 mg total) by mouth at bedtime as needed for muscle spasms.   dicyclomine 20 MG tablet Commonly known as:  BENTYL Take 1 tablet (20 mg total) by mouth 3 (three) times daily before meals.   Flaxseed Oil 1000 MG Caps Take 1,000 mg by mouth daily.   Ginkgo 60 MG Tabs Take 2 tablets by mouth daily.   hydrochlorothiazide 25 MG tablet Commonly known as:  HYDRODIURIL Take 1 tablet (25 mg total) by mouth daily.   hydrocortisone 2.5 % cream Apply topically 2 (two) times daily.  ibuprofen 200 MG tablet Commonly known as:  ADVIL,MOTRIN Take 400 mg by mouth 2 (two) times daily as needed.   metFORMIN 500 MG tablet Commonly known as:  GLUCOPHAGE Take 1 tablet (500 mg total) by mouth 2 (two) times daily with a meal.   multivitamin tablet Take 1 tablet by mouth daily.   omeprazole 40 MG capsule Commonly known as:  PRILOSEC Take 1 capsule (40 mg total) by mouth daily.   SINUS BUSTER NA Place 1 puff into the nose daily as needed.          Objective:   Physical Exam BP 124/72 (BP Location: Left Arm, Patient Position: Sitting, Cuff Size: Normal)   Pulse (!) 55   Temp (!) 97.5 F (36.4 C) (Oral)   Resp 14   Ht 5\' 5"  (1.651 m)   Wt 222 lb 8 oz (100.9 kg)   SpO2 98%   BMI 37.03 kg/m   General:   Well developed, well nourished . NAD.  Neck: No  thyromegaly  HEENT:  Normocephalic . Face symmetric, atraumatic Lungs:  CTA B Normal respiratory effort, no intercostal retractions, no accessory muscle use. Heart: RRR,  no murmur.  No pretibial edema bilaterally  Abdomen:  Not distended, soft, non-tender. No rebound or rigidity.   Skin: Exposed areas without rash. Not pale. Not jaundice DIABETIC FEET EXAM: No lower  extremity edema Normal pedal pulses bilaterally Skin with some pretibial hyperpigmentation, nails are thick, no calluses Pinprick examination of the feet normal. Neurologic:  alert & oriented X3.  Speech normal, gait appropriate for age and unassisted Strength symmetric and appropriate for age.  Psych: Cognition and judgment appear intact.  Cooperative with normal attention span and concentration.  Behavior appropriate. No anxious or depressed appearing.    Assessment & Plan:   Assessment   DM  HTN Polycythemia- sees hematology, likely d/t OSA-diuretics, JAK2 (-) ~ 2014, last OV 10-2014, f/u prn OSA on CPAP DJD- saw Dr Rhona Raider before  R Foot drop  GERD IBS? GI sx on and off, previously dx w/ IBS Urolithiasis, several episodes   PLAN: DM: Last A1c 7.0, explained the patient what a  A1c is, recommend to start metformin 500 mg one tablet twice a day, only once a day the first week. A healthy diet and exercise encouraged. Check a microalbumin. Foot exam negative today DJD: Mostly L knee, saw ortho,  apparently they rx meloxicam, he is also taking ibuprofen 400 mg twice a day and Tylenol. Rec to stop meloxicam, use primarily Tylenol and okay to take ibuprofen sporadically. Continue with the knee brace. LLQ abdominal pain: resolved  Polycythemia: Last hemoglobin satisfactory RTC 3 months.

## 2017-01-29 NOTE — Assessment & Plan Note (Addendum)
-  Td 2018, pneumonia shot 2014; prevnar: 01-2015; Shingles shot 2011; interested in shingrix but not widely available yet;  flu shot today   -CCS: cscope ~ 2008 in [place],  [place] again 05-2012, Dr Ardis Hughs, neg, next 2019 due to [place] - prostrate ca screening: DRE wnl 2016,   PSA 02-2016 ok  - Recent labs reviewed - diet exercise discussed

## 2017-01-29 NOTE — Assessment & Plan Note (Signed)
DM: Last A1c 7.0, explained the patient what a  A1c is, recommend to start metformin 500 mg one tablet twice a day, only once a day the first week. A healthy diet and exercise encouraged. Check a microalbumin. Foot exam negative today DJD: Mostly L knee, saw ortho,  apparently they rx meloxicam, he is also taking ibuprofen 400 mg twice a day and Tylenol. Rec to stop meloxicam, use primarily Tylenol and okay to take ibuprofen sporadically. Continue with the knee brace. LLQ abdominal pain: resolved  Polycythemia: Last hemoglobin satisfactory RTC 3 months.

## 2017-01-31 LAB — MICROALBUMIN / CREATININE URINE RATIO
Creatinine,U: 123.2 mg/dL
Microalb Creat Ratio: 0.6 mg/g (ref 0.0–30.0)

## 2017-02-21 ENCOUNTER — Other Ambulatory Visit: Payer: Self-pay | Admitting: Internal Medicine

## 2017-04-23 ENCOUNTER — Ambulatory Visit: Payer: 59 | Admitting: Pulmonary Disease

## 2017-05-01 ENCOUNTER — Encounter: Payer: Self-pay | Admitting: Internal Medicine

## 2017-05-01 ENCOUNTER — Ambulatory Visit (INDEPENDENT_AMBULATORY_CARE_PROVIDER_SITE_OTHER): Payer: 59 | Admitting: Internal Medicine

## 2017-05-01 VITALS — BP 142/76 | HR 58 | Temp 98.1°F | Resp 14 | Ht 65.0 in | Wt 220.4 lb

## 2017-05-01 DIAGNOSIS — E119 Type 2 diabetes mellitus without complications: Secondary | ICD-10-CM | POA: Diagnosis not present

## 2017-05-01 DIAGNOSIS — I10 Essential (primary) hypertension: Secondary | ICD-10-CM | POA: Diagnosis not present

## 2017-05-01 NOTE — Progress Notes (Signed)
Subjective:    Patient ID: Donald Jones, male    DOB: Aug 11, 1945, 72 y.o.   MRN: [id number]  DOS:  05/01/2017 Type of visit - description : f/u Interval history: Since the last office visit, he started metformin, good compliance, no apparent side effects. Ambulatory BPs in the 120s.  BP Readings from Last 3 Encounters:  05/01/17 (!) 142/76  01/29/17 124/72  11/17/16 138/88   Wt Readings from Last 3 Encounters:  05/01/17 220 lb 6 oz (100 kg)  01/29/17 222 lb 8 oz (100.9 kg)  11/17/16 224 lb 4 oz (101.7 kg)     Review of Systems  No chest pain or difficulty breathing No nausea, vomiting, diarrhea Diet improved, trying to cut on portion sizes.  He does not exercise but is very active at work.  Past Medical History:  Diagnosis Date  . GERD (gastroesophageal reflux disease)   . Hypertension   . Kidney stones    several episodes.  Last one in 2011.    . Obesity    100-lb weight loss since 2011.   . Polycythemia   . Sleep apnea    on Cpap    Past Surgical History:  Procedure Laterality Date  . CHOLECYSTECTOMY    . HAND SURGERY  2010   R hand , d/t a injury, 3 surgeries     Social History   Socioeconomic History  . Marital status: Married    Spouse name: Not on file  . Number of children: 3   . Years of education: Not on file  . Highest education level: Not on file  Social Needs  . Financial resource strain: Not on file  . Food insecurity - worry: Not on file  . Food insecurity - inability: Not on file  . Transportation needs - medical: Not on file  . Transportation needs - non-medical: Not on file  Occupational History  . Occupation: LANDSCAPING  Tobacco Use  . Smoking status: Never Smoker  . Smokeless tobacco: Never Used  Substance and Sexual Activity  . Alcohol use: No    Comment: none for 15 years  . Drug use: No  . Sexual activity: No  Other Topics Concern  . Not on file  Social History Narrative   1ST WIFE DIED FROM LUNG CANCER, 2ND WIFE  DIED FROM RENAL CANCER.     Married x 4 , lives w/ wife   3 sisters and 3 brother : lost 2 sisters       Allergies as of 05/01/2017      Reactions   Sulfa Antibiotics Nausea And Vomiting      Medication List        Accurate as of 05/01/17 11:59 PM. Always use your most recent med list.          acetaminophen 325 MG tablet Commonly known as:  TYLENOL Take 650 mg by mouth every 6 (six) hours as needed for pain.   aspirin 81 MG tablet Take 81 mg by mouth daily.   B-12 500 MCG Tabs Take 1 tablet by mouth daily.   cyclobenzaprine 10 MG tablet Commonly known as:  FLEXERIL Take 1 tablet (10 mg total) by mouth at bedtime as needed for muscle spasms.   dicyclomine 20 MG tablet Commonly known as:  BENTYL Take 1 tablet (20 mg total) by mouth 3 (three) times daily before meals.   Flaxseed Oil 1000 MG Caps Take 1,000 mg by mouth daily.   Ginkgo 60 MG Tabs Take 2  tablets by mouth daily.   hydrochlorothiazide 25 MG tablet Commonly known as:  HYDRODIURIL Take 1 tablet (25 mg total) daily by mouth.   hydrocortisone 2.5 % cream Apply topically 2 (two) times daily.   metFORMIN 500 MG tablet Commonly known as:  GLUCOPHAGE Take 1 tablet (500 mg total) by mouth 2 (two) times daily with a meal.   multivitamin tablet Take 1 tablet by mouth daily.   omeprazole 40 MG capsule Commonly known as:  PRILOSEC Take 1 capsule (40 mg total) by mouth daily.   SINUS BUSTER NA Place 1 puff into the nose daily as needed.          Objective:   Physical Exam BP (!) 142/76 (BP Location: Left Arm, Patient Position: Sitting, Cuff Size: Normal)   Pulse (!) 58   Temp 98.1 F (36.7 C) (Oral)   Resp 14   Ht 5\' 5"  (1.651 m)   Wt 220 lb 6 oz (100 kg)   SpO2 96%   BMI 36.67 kg/m  General:   Well developed, well nourished . NAD.  HEENT:  Normocephalic . Face symmetric, atraumatic Lungs:  CTA B Normal respiratory effort, no intercostal retractions, no accessory muscle use. Heart: RRR,   no murmur.  No pretibial edema bilaterally  Skin: Very thin and slightly hyperpigmented at the pretibial areas Neurologic:  alert & oriented X3.  Speech normal, gait appropriate for age and unassisted Psych--  Cognition and judgment appear intact.  Cooperative with normal attention span and concentration.  Behavior appropriate. No anxious or depressed appearing.      Assessment & Plan:   Assessment   DM  HTN Polycythemia- sees hematology, likely d/t OSA-diuretics, JAK2 (-) ~ 2014, last OV 10-2014, f/u prn OSA on CPAP DJD- saw Dr Rhona Raider before  R Foot drop  GERD IBS? GI sx on and off, previously dx w/ IBS Urolithiasis, several episodes   PLAN: DM: Last A1c 7.0, started metformin BID, good  compliance, no apparent s/e, CBGs at home around 114.  Only a few times CBGs go up to 140 depending on diet.  Will check a BMP, AST, ALT, A1c.  Has lost some weight, doing better with diet, praised. HTN: Currently on HCTZ, BP today 140, home in the 120s.  No change. RTC 4-5 months.

## 2017-05-01 NOTE — Progress Notes (Signed)
Pre visit review using our clinic review tool, if applicable. No additional management support is needed unless otherwise documented below in the visit note. 

## 2017-05-01 NOTE — Patient Instructions (Signed)
GO TO THE LAB : Get the blood work     GO TO THE FRONT DESK Schedule your next appointment for a routine checkup in 4 to 5 months   

## 2017-05-02 LAB — BASIC METABOLIC PANEL
BUN: 15 mg/dL (ref 6–23)
CALCIUM: 9.8 mg/dL (ref 8.4–10.5)
CHLORIDE: 103 meq/L (ref 96–112)
CO2: 32 meq/L (ref 19–32)
Creatinine, Ser: 1.25 mg/dL (ref 0.40–1.50)
GFR: 60.79 mL/min (ref >60.00)
Glucose, Bld: 98 mg/dL (ref 70–99)
Potassium: 3.9 mEq/L (ref 3.5–5.1)
SODIUM: 142 meq/L (ref 135–145)

## 2017-05-02 LAB — AST: AST: 26 U/L (ref 0–37)

## 2017-05-02 LAB — ALT: ALT: 23 U/L (ref 0–53)

## 2017-05-02 LAB — HEMOGLOBIN A1C: HEMOGLOBIN A1C: 6.7 % — AB (ref 4.6–6.5)

## 2017-05-02 NOTE — Assessment & Plan Note (Signed)
DM: Last A1c 7.0, started metformin BID, good  compliance, no apparent s/e, CBGs at home around 114.  Only a few times CBGs go up to 140 depending on diet.  Will check a BMP, AST, ALT, A1c.  Has lost some weight, doing better with diet, praised. HTN: Currently on HCTZ, BP today 140, home in the 120s.  No change. RTC 4-5 months.

## 2017-05-17 ENCOUNTER — Encounter: Payer: Self-pay | Admitting: Pulmonary Disease

## 2017-05-17 ENCOUNTER — Ambulatory Visit: Payer: 59 | Admitting: Pulmonary Disease

## 2017-05-17 DIAGNOSIS — D751 Secondary polycythemia: Secondary | ICD-10-CM | POA: Diagnosis not present

## 2017-05-17 DIAGNOSIS — G4733 Obstructive sleep apnea (adult) (pediatric): Secondary | ICD-10-CM

## 2017-05-17 NOTE — Progress Notes (Signed)
   Subjective:    Patient ID: Donald Jones, male    DOB: [birth year], 72 y.o.   MRN: [id number]  HPI  72 year old man presents for follow-up of obstructive sleep apnea. He was diagnosed in 2003 and has been maintained on CPAP since then. He has preferred auto CPAP settings and on prior downloads abdominal pressure has been 13 cm. He now states that he has a large leak from his nose and mouth and the sometimes wakes him up at night.  He lost some weight and switched from a full face to nasal mask. CPAP download was reviewed and this shows average pressure of 13 cm, he is on auto settings 10-20 cm and has moderate leak with good control of events.   He also has a history of polycythemia and I note that the last hemoglobin in 2018 was 15.6   Past Medical History:  Diagnosis Date  . GERD (gastroesophageal reflux disease)   . Hypertension   . Kidney stones    several episodes.  Last one in 2011.    . Obesity    100-lb weight loss since 2011.   . Polycythemia   . Sleep apnea    on Cpap     Review of Systems neg for any significant sore throat, dysphagia, itching, sneezing, nasal congestion or excess/ purulent secretions, fever, chills, sweats, unintended wt loss, pleuritic or exertional cp, hempoptysis, orthopnea pnd or change in chronic leg swelling. Also denies presyncope, palpitations, heartburn, abdominal pain, nausea, vomiting, diarrhea or change in bowel or urinary habits, dysuria,hematuria, rash, arthralgias, visual complaints, headache, numbness weakness or ataxia.     Objective:   Physical Exam   Gen. Pleasant, well-nourished, in no distress ENT - no thrush, no post nasal drip Neck: No JVD, no thyromegaly, no carotid bruits Lungs: no use of accessory muscles, no dullness to percussion, clear without rales or rhonchi  Cardiovascular: Rhythm regular, heart sounds  normal, no murmurs or gallops, no peripheral edema Musculoskeletal: No deformities, no cyanosis or clubbing          Assessment & Plan:

## 2017-05-17 NOTE — Patient Instructions (Signed)
Change CPAP settings to 7-15 cm Prescription will be sent to advance for new  mask he can go over for a fitting session, he may need a full facemask, trial of F- 30 Resmed-full facemask

## 2017-05-17 NOTE — Addendum Note (Signed)
Addended by: Valerie Salts on: 05/17/2017 04:15 PM   Modules accepted: Orders

## 2017-05-17 NOTE — Assessment & Plan Note (Signed)
Change CPAP settings to 7-15 cm Prescription will be sent to advance for new  mask he can go over for a fitting session, he may need a full facemask, trial of F- 30 Resmed-full facemask  Weight loss encouraged, compliance with goal of at least 4-6 hrs every night is the expectation. Advised against medications with sedative side effects Cautioned against driving when sleepy - understanding that sleepiness will vary on a day to day basis

## 2017-05-17 NOTE — Assessment & Plan Note (Signed)
Appears controlled now

## 2017-05-29 ENCOUNTER — Other Ambulatory Visit: Payer: Self-pay | Admitting: Internal Medicine

## 2017-06-08 ENCOUNTER — Encounter: Payer: Self-pay | Admitting: Gastroenterology

## 2017-07-11 ENCOUNTER — Encounter: Payer: Self-pay | Admitting: Gastroenterology

## 2017-07-11 ENCOUNTER — Encounter: Payer: Self-pay | Admitting: Internal Medicine

## 2017-07-11 ENCOUNTER — Ambulatory Visit: Payer: BLUE CROSS/BLUE SHIELD | Admitting: Internal Medicine

## 2017-07-11 VITALS — BP 126/80 | HR 60 | Temp 98.2°F | Resp 14 | Ht 65.0 in | Wt 217.4 lb

## 2017-07-11 DIAGNOSIS — E119 Type 2 diabetes mellitus without complications: Secondary | ICD-10-CM | POA: Diagnosis not present

## 2017-07-11 DIAGNOSIS — J069 Acute upper respiratory infection, unspecified: Secondary | ICD-10-CM | POA: Diagnosis not present

## 2017-07-11 MED ORDER — BENZONATATE 200 MG PO CAPS: 200.0000 mg | ORAL_CAPSULE | Freq: Three times a day (TID) | ORAL | 0 refills | Status: DC | PRN

## 2017-07-11 MED ORDER — AZELASTINE HCL 0.1 % NA SOLN: 2.0000 | Freq: Two times a day (BID) | NASAL | 6 refills | Status: DC

## 2017-07-11 NOTE — Progress Notes (Signed)
Subjective:    Patient ID: Donald Jones, male    DOB: [birth year], 72 y.o.   MRN: [id number]  DOS:  07/11/2017 Type of visit - description : acute Interval history: Symptoms started yesterday, sore throat, throat feels a scratchy, ear pressure, postnasal dripping.  Also, has noted a clear relationship between eating carbohydrates and increased blood sugar.  Would like to   improve his diet   Review of Systems No fever but was feeling really cold and chilly last night. Very little cough, she thinks is due to postnasal dripping. No nausea or vomiting.  Had diarrhea x2 after yesterday.   Past Medical History:  Diagnosis Date  . GERD (gastroesophageal reflux disease)   . Hypertension   . Kidney stones    several episodes.  Last one in 2011.    . Obesity    100-lb weight loss since 2011.   . Polycythemia   . Sleep apnea    on Cpap    Past Surgical History:  Procedure Laterality Date  . CHOLECYSTECTOMY    . HAND SURGERY  2010   R hand , d/t a injury, 3 surgeries     Social History   Socioeconomic History  . Marital status: Married    Spouse name: Not on file  . Number of children: 3   . Years of education: Not on file  . Highest education level: Not on file  Occupational History  . 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 Financial resource strain: Not on file  . Food insecurity:    Worry: Not on file    Inability: Not on file  . Transportation needs:    Medical: Not on file    Non-medical: Not on file  Tobacco Use  . Smoking status: Never Smoker  . Smokeless tobacco: Never Used  Substance and Sexual Activity  . Alcohol use: No    Comment: none for 15 years  . Drug use: No  . Sexual activity: Never  Lifestyle  . Physical activity:    Days per week: Not on file    Minutes per session: Not on file  . Stress: Not on file  Relationships  . Social connections:    Talks on phone: Not on file    Gets together: Not on file    Attends religious service:  Not on file    Active member of club or organization: Not on file    Attends meetings of clubs or organizations: Not on file    Relationship status: Not on file  . Intimate partner violence:    Fear of current or ex partner: Not on file    Emotionally abused: Not on file    Physically abused: Not on file    Forced sexual activity: Not on file  Other Topics Concern  . Not on file  Social History Narrative   1ST WIFE DIED FROM LUNG CANCER, 2ND WIFE DIED FROM RENAL CANCER.     Married x 4 , lives w/ wife   3 sisters and 3 brother : lost 2 sisters       Allergies as of 07/11/2017      Reactions   Sulfa Antibiotics Nausea And Vomiting      Medication List        Accurate as of 07/11/17 11:59 PM. Always use your most recent med list.          acetaminophen 325 MG tablet Commonly known as:  TYLENOL Take 650 mg by mouth  every 6 (six) hours as needed for pain.   aspirin 81 MG tablet Take 81 mg by mouth daily.   azelastine 0.1 % nasal spray Commonly known as:  ASTELIN Place 2 sprays into both nostrils 2 (two) times daily.   benzonatate 200 MG capsule Commonly known as:  TESSALON Take 1 capsule (200 mg total) by mouth 3 (three) times daily as needed for cough.   cyclobenzaprine 10 MG tablet Commonly known as:  FLEXERIL Take 1 tablet (10 mg total) by mouth at bedtime as needed for muscle spasms.   dicyclomine 20 MG tablet Commonly known as:  BENTYL Take 1 tablet (20 mg total) by mouth 3 (three) times daily before meals.   Flaxseed Oil 1000 MG Caps Take 1,000 mg by mouth daily.   Ginkgo 60 MG Tabs Take 2 tablets by mouth daily.   hydrochlorothiazide 25 MG tablet Commonly known as:  HYDRODIURIL Take 1 tablet (25 mg total) daily by mouth.   hydrocortisone 2.5 % cream Apply topically 2 (two) times daily.   metFORMIN 500 MG tablet Commonly known as:  GLUCOPHAGE Take 1 tablet (500 mg total) by mouth 2 (two) times daily with a meal.   multivitamin tablet Take 1  tablet by mouth daily.   omeprazole 40 MG capsule Commonly known as:  PRILOSEC Take 1 capsule (40 mg total) by mouth daily.   SINUS BUSTER NA Place 1 puff into the nose daily as needed.          Objective:   Physical Exam BP 126/80 (BP Location: Left Arm, Patient Position: Sitting, Cuff Size: Normal)   Pulse 60   Temp 98.2 F (36.8 C) (Oral)   Resp 14   Ht 5\' 5"  (1.651 m)   Wt 217 lb 6 oz (98.6 kg)   SpO2 97%   BMI 36.17 kg/m  General:   Well developed, well nourished . NAD.  HEENT:  Normocephalic . Face symmetric, atraumatic.  Nose congested.  TMs slightly bulged but not red.  Throat symmetric, no red Lungs:  CTA B Normal respiratory effort, no intercostal retractions, no accessory muscle use. Heart: RRR,  no murmur.  No pretibial edema bilaterally  Skin: Not pale. Not jaundice Neurologic:  alert & oriented X3.  Speech normal, gait appropriate for age and unassisted Psych--  Cognition and judgment appear intact.  Cooperative with normal attention span and concentration.  Behavior appropriate. No anxious or depressed appearing.      Assessment & Plan:   Assessment   DM  HTN Polycythemia- sees hematology, likely d/t OSA-diuretics, JAK2 (-) ~ 2014, last OV 10-2014, f/u prn OSA on CPAP DJD- saw Dr Rhona Raider before  R Foot drop  GERD IBS? GI sx on and off, previously dx w/ IBS Urolithiasis, several episodes   PLAN: URI: Recommend supportive treatment with rest, fluids, Flonase, Astelin.  Unable to take Mucinex DM, recommend Tessalon Perles.  Call if not improving. DM: Needs more information about diet, see AVS, strongly encourage self learning, refer to a nutritionist

## 2017-07-11 NOTE — Patient Instructions (Addendum)
Rest, fluids , tylenol  For cough:  Take   Tessalon Perles as needed until better  For nasal congestion: Use OTC Nasocort or Flonase : 2 nasal sprays on each side of the nose in the morning until you feel better Use ASTELIN a prescribed spray : 2 nasal sprays on each side of the nose at night until you feel better   Avoid decongestants such as  Pseudoephedrine or phenylephrine    Call if not gradually better over the next  10 days  Call anytime if the symptoms are severe     DIABETES self learn tools:  Get the book "Diabetes for Dummies" Get the book: "The [facility] Diabetes diet"   Online resources: The American diabetes Association     diabetes.[facility] website it is a Microbiologist  [place].org  The [facility] web site has a diabetes section  [employer]

## 2017-07-11 NOTE — Progress Notes (Signed)
Pre visit review using our clinic review tool, if applicable. No additional management support is needed unless otherwise documented below in the visit note. 

## 2017-07-12 NOTE — Assessment & Plan Note (Signed)
URI: Recommend supportive treatment with rest, fluids, Flonase, Astelin.  Unable to take Mucinex DM, recommend Tessalon Perles.  Call if not improving. DM: Needs more information about diet, see AVS, strongly encourage self learning, refer to a nutritionist

## 2017-08-03 DIAGNOSIS — G4733 Obstructive sleep apnea (adult) (pediatric): Secondary | ICD-10-CM | POA: Diagnosis not present

## 2017-08-09 ENCOUNTER — Encounter: Payer: BLUE CROSS/BLUE SHIELD | Attending: Internal Medicine | Admitting: Registered"

## 2017-08-09 DIAGNOSIS — E119 Type 2 diabetes mellitus without complications: Secondary | ICD-10-CM | POA: Insufficient documentation

## 2017-08-09 DIAGNOSIS — Z713 Dietary counseling and surveillance: Secondary | ICD-10-CM | POA: Diagnosis not present

## 2017-08-10 ENCOUNTER — Encounter: Payer: Self-pay | Admitting: Registered"

## 2017-08-10 NOTE — Progress Notes (Signed)
Patient was seen on 08/09/2017 for the first of a series of three diabetes self-management courses at [facility].  Patient Education Plan per assessed needs and concerns is to attend three course education program for Diabetes Self Management Education.  The following learning objectives were met by the patient during this class: ? Describe diabetes ? State some common risk factors for diabetes ? Defines the role of glucose and insulin ? Identifies type of diabetes and pathophysiology ? Describe the relationship between diabetes and cardiovascular risk ? State the members of the Healthcare Team ? States the rationale for glucose monitoring ? State when to test glucose ? State their individual Target Range ? State the importance of logging glucose readings ? Describe how to interpret glucose readings ? Identifies A1C target ? Explain the correlation between A1c and eAG values ? State symptoms and treatment of high blood glucose ? State symptoms and treatment of low blood glucose ? Explain proper technique for glucose testing ? Identifies proper sharps disposal  Handouts given during class include: ? Live Life Well with Diabetes ? Carb Counting and Meal Planning book ? Meal Plan Card ? Meal planning worksheet ? Low Sodium Flavoring Tips ? Types of Fats ? The diabetes portion plate ? A1c to eAG Conversion Chart ? Diabetes Recommended Care Schedule ? Support Group ? Diabetes Success Plan ? Core Class Satisfaction Survey   Follow-Up Plan: ? Attend core 2           

## 2017-08-16 ENCOUNTER — Encounter: Payer: BLUE CROSS/BLUE SHIELD | Attending: Internal Medicine | Admitting: Dietician

## 2017-08-16 DIAGNOSIS — Z713 Dietary counseling and surveillance: Secondary | ICD-10-CM | POA: Insufficient documentation

## 2017-08-16 DIAGNOSIS — E119 Type 2 diabetes mellitus without complications: Secondary | ICD-10-CM

## 2017-08-17 NOTE — Progress Notes (Signed)
Patient was seen on 08/16/17 for the second of a series of three diabetes self-management courses at the [facility]. The following learning objectives were met by the patient during this class:   Describe the role of different macronutrients on glucose  Explain how carbohydrates affect blood glucose  State what foods contain the most carbohydrates  Demonstrate carbohydrate counting  Demonstrate how to read Nutrition Facts food label  Describe effects of various fats on heart health  Describe the importance of good nutrition for health and healthy eating strategies  Describe techniques for managing your shopping, cooking and meal planning  List strategies to follow meal plan when dining out  Describe the effects of alcohol on glucose and how to use it safely  Goals:  Follow Diabetes Meal Plan as instructed  Aim to spread carbs evenly throughout the day  Aim for 3 meals per day and snacks as needed Include lean protein foods to meals/snacks  Monitor glucose levels as instructed by your doctor   Follow-Up Plan:  Attend Core 3  Work towards following your personal food plan.

## 2017-08-23 ENCOUNTER — Encounter: Payer: Self-pay | Admitting: Dietician

## 2017-08-23 ENCOUNTER — Other Ambulatory Visit: Payer: Self-pay | Admitting: Internal Medicine

## 2017-08-23 ENCOUNTER — Encounter: Payer: BLUE CROSS/BLUE SHIELD | Admitting: Dietician

## 2017-08-23 DIAGNOSIS — Z713 Dietary counseling and surveillance: Secondary | ICD-10-CM | POA: Diagnosis not present

## 2017-08-23 DIAGNOSIS — E119 Type 2 diabetes mellitus without complications: Secondary | ICD-10-CM

## 2017-08-23 NOTE — Progress Notes (Signed)
Patient was seen on 08/23/17 for the third of a series of three diabetes self-management courses at the [facility].   Donald Jones the amount of activity recommended for healthy living . Describe activities suitable for individual needs . Identify ways to regularly incorporate activity into daily life . Identify barriers to activity and ways to over come these barriers  Identify diabetes medications being personally used and their primary action for lowering glucose and possible side effects . Describe role of stress on blood glucose and develop strategies to address psychosocial issues . Identify diabetes complications and ways to prevent them  Explain how to manage diabetes during illness . Evaluate success in meeting personal goal . Establish 2-3 goals that they will plan to diligently work on  Goals:   I will count my carb choices at most meals and snacks  I will be active 30 minutes or more 3 times a week  Your patient has identified these potential barriers to change:  Motivation  Your patient has identified their diabetes self-care support plan as  Family Support    Plan:  Attend Support Group as desired

## 2017-09-04 ENCOUNTER — Ambulatory Visit (AMBULATORY_SURGERY_CENTER): Payer: Self-pay

## 2017-09-04 ENCOUNTER — Other Ambulatory Visit: Payer: Self-pay

## 2017-09-04 VITALS — Ht 65.0 in | Wt 215.8 lb

## 2017-09-04 DIAGNOSIS — Z800 Family history of malignant neoplasm of digestive organs: Secondary | ICD-10-CM

## 2017-09-04 MED ORDER — PEG 3350-KCL-NA BICARB-NACL 420 G PO SOLR
4000.0000 mL | Freq: Once | ORAL | 0 refills | Status: AC
Start: 2017-09-04 — End: 2017-09-04

## 2017-09-04 NOTE — Progress Notes (Signed)
No egg or soy allergy known to patient  No issues with past sedation with any surgeries  or procedures, no intubation problems  No diet pills per patient No home 02 use per patient  No blood thinners per patient  Pt denies issues with constipation  No A fib or A flutter  EMMI video sent to pt's e mail , pt declined    

## 2017-09-05 ENCOUNTER — Encounter: Payer: Self-pay | Admitting: Internal Medicine

## 2017-09-05 ENCOUNTER — Ambulatory Visit: Payer: BLUE CROSS/BLUE SHIELD | Admitting: Internal Medicine

## 2017-09-05 VITALS — BP 124/70 | HR 58 | Temp 98.0°F | Resp 16 | Ht 65.0 in | Wt 215.4 lb

## 2017-09-05 DIAGNOSIS — E119 Type 2 diabetes mellitus without complications: Secondary | ICD-10-CM

## 2017-09-05 DIAGNOSIS — M1991 Primary osteoarthritis, unspecified site: Secondary | ICD-10-CM | POA: Diagnosis not present

## 2017-09-05 DIAGNOSIS — N529 Male erectile dysfunction, unspecified: Secondary | ICD-10-CM | POA: Diagnosis not present

## 2017-09-05 DIAGNOSIS — I10 Essential (primary) hypertension: Secondary | ICD-10-CM | POA: Diagnosis not present

## 2017-09-05 MED ORDER — SILDENAFIL CITRATE 20 MG PO TABS: 60.0000 mg | ORAL_TABLET | Freq: Every evening | ORAL | 3 refills | Status: DC | PRN

## 2017-09-05 NOTE — Patient Instructions (Addendum)
GO TO THE LAB : Get the blood work     GO TO THE FRONT DESK Schedule your next appointment for a  Physical exam by 01-2018  Reduce meloxicam to 3 times a week  Tylenol  500 mg OTC 2 tabs a day every 8 hours as needed for pain  Sildenafil (Viagra) Take 3 or 4 tablets  No more than once a day 1 hour before  sexual activity Read carefully the side effects

## 2017-09-05 NOTE — Progress Notes (Signed)
Subjective:    Patient ID: Donald Jones, male    DOB: [birth year], 72 y.o.   MRN: [id number]  DOS:  09/05/2017 Type of visit - description : Routine visit Interval history: DM: Good compliance with metformin, no apparent side effects HTN: Good compliance with meds, last BMP satisfactory Constipation for the last few days, described as hard to stool, he does have a BM daily.  Taking a stool softener with good results DJD: Taking meloxicam daily, he follows GI precautions.  Wonders if that is okay. ED: New complaint, decreased quality of erection for the last year, libido slightly low as well.  Would like to try Viagra.   Review of Systems Denies chest pain or difficulty breathing No nausea, vomiting, diarrhea Denies claudication (does have right leg pain when he walks since he developed the R foot drop).  Past Medical History:  Diagnosis Date  . Arthritis    hands right  . Diabetes mellitus without complication ([place])   . GERD (gastroesophageal reflux disease)   . Hypertension   . Kidney stones    several episodes.  Last one in 2011.    . Obesity    100-lb weight loss since 2011.   . Polycythemia   . Sleep apnea    on Cpap    Past Surgical History:  Procedure Laterality Date  . CHOLECYSTECTOMY    . HAND SURGERY  2010   R hand , d/t a injury, 3 surgeries     Social History   Socioeconomic History  . Marital status: Married    Spouse name: Not on file  . Number of children: 3   . Years of education: Not on file  . Highest education level: Not on file  Occupational History  . 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 Financial resource strain: Not on file  . Food insecurity:    Worry: Not on file    Inability: Not on file  . Transportation needs:    Medical: Not on file    Non-medical: Not on file  Tobacco Use  . Smoking status: Never Smoker  . Smokeless tobacco: Never Used  Substance and Sexual Activity  . Alcohol use: No    Comment: none for 15 years    . Drug use: No  . Sexual activity: Never  Lifestyle  . Physical activity:    Days per week: Not on file    Minutes per session: Not on file  . Stress: Not on file  Relationships  . Social connections:    Talks on phone: Not on file    Gets together: Not on file    Attends religious service: Not on file    Active member of club or organization: Not on file    Attends meetings of clubs or organizations: Not on file    Relationship status: Not on file  . Intimate partner violence:    Fear of current or ex partner: Not on file    Emotionally abused: Not on file    Physically abused: Not on file    Forced sexual activity: Not on file  Other Topics Concern  . Not on file  Social History Narrative   1ST WIFE DIED FROM LUNG CANCER, 2ND WIFE DIED FROM RENAL CANCER.     Married x 4 , lives w/ wife   3 sisters and 3 brother : lost 2 sisters       Allergies as of 09/05/2017      Reactions  Sulfa Antibiotics Nausea And Vomiting      Medication List        Accurate as of 09/05/17 11:59 PM. Always use your most recent med list.          acetaminophen 325 MG tablet Commonly known as:  TYLENOL Take 650 mg by mouth every 6 (six) hours as needed for pain.   aspirin 81 MG tablet Take 81 mg by mouth daily.   azelastine 0.1 % nasal spray Commonly known as:  ASTELIN Place 2 sprays into both nostrils 2 (two) times daily.   dicyclomine 20 MG tablet Commonly known as:  BENTYL Take 1 tablet (20 mg total) by mouth 3 (three) times daily before meals.   Flaxseed Oil 1000 MG Caps Take 1,000 mg by mouth daily.   hydrochlorothiazide 25 MG tablet Commonly known as:  HYDRODIURIL Take 1 tablet (25 mg total) by mouth daily.   hydrocortisone 2.5 % cream Apply topically 2 (two) times daily.   meloxicam 7.5 MG tablet Commonly known as:  MOBIC   metFORMIN 500 MG tablet Commonly known as:  GLUCOPHAGE Take 1 tablet (500 mg total) by mouth 2 (two) times daily with a meal.    multivitamin tablet Take 1 tablet by mouth daily.   omeprazole 40 MG capsule Commonly known as:  PRILOSEC Take 1 capsule (40 mg total) by mouth daily.   sildenafil 20 MG tablet Commonly known as:  REVATIO Take 3-4 tablets (60-80 mg total) by mouth at bedtime as needed.   SINUS BUSTER NA Place 1 puff into the nose daily as needed.          Objective:   Physical Exam BP 124/70 (BP Location: Left Arm, Patient Position: Sitting, Cuff Size: Normal)   Pulse (!) 58   Temp 98 F (36.7 C) (Oral)   Resp 16   Ht 5\' 5"  (1.651 m)   Wt 215 lb 6 oz (97.7 kg)   SpO2 96%   BMI 35.84 kg/m  General:   Well developed, well nourished . NAD.  HEENT:  Normocephalic . Face symmetric, atraumatic Lungs:  CTA B Normal respiratory effort, no intercostal retractions, no accessory muscle use. Heart: RRR,  no murmur.  No pretibial edema bilaterally  Normal femoral pulses bilaterally Skin: Not pale. Not jaundice Neurologic:  alert & oriented X3.  Speech normal, gait appropriate for age and unassisted Psych--  Cognition and judgment appear intact.  Cooperative with normal attention span and concentration.  Behavior appropriate. No anxious or depressed appearing.      Assessment & Plan:    Assessment   DM  HTN Polycythemia- sees hematology, likely d/t OSA-diuretics, JAK2 (-) ~ 2014, last OV 10-2014, f/u prn OSA on CPAP DJD- saw Dr Rhona Raider before  R Foot drop  GERD IBS? GI sx on and off, previously dx w/ IBS Urolithiasis, several episodes   PLAN: DM: Currently on metformin, check A1c, AST, ALT.  Had an eye check few weeks ago. HTN: Seems well controlled on hydrochlorothiazide.  Check a BMP and EKG DJD: Takes daily meloxicam mostly for hand pain.  Recommend to continue practicing GI precautions, decrease meloxicam to 3 or 4 times a week, and take Tylenol prn.  See instructions.  Checking his kidney function today. Constipation: Okay to take a stool softener as needed ED: New  issue, okay to take sildenafil, how to use is on side effects discussed. RTC 01-2018 CPX

## 2017-09-05 NOTE — Progress Notes (Signed)
Pre visit review using our clinic review tool, if applicable. No additional management support is needed unless otherwise documented below in the visit note. 

## 2017-09-06 DIAGNOSIS — N529 Male erectile dysfunction, unspecified: Secondary | ICD-10-CM | POA: Insufficient documentation

## 2017-09-06 DIAGNOSIS — M1990 Unspecified osteoarthritis, unspecified site: Secondary | ICD-10-CM | POA: Insufficient documentation

## 2017-09-06 LAB — BASIC METABOLIC PANEL
BUN: 20 mg/dL (ref 6–23)
CHLORIDE: 103 meq/L (ref 96–112)
CO2: 32 mEq/L (ref 19–32)
Calcium: 10.4 mg/dL (ref 8.4–10.5)
Creatinine, Ser: 1.47 mg/dL (ref 0.40–1.50)
GFR: 50.37 mL/min — AB (ref >60.00)
Glucose, Bld: 121 mg/dL — ABNORMAL HIGH (ref 70–99)
Potassium: 5 mEq/L (ref 3.5–5.1)
Sodium: 141 mEq/L (ref 135–145)

## 2017-09-06 LAB — ALT: ALT: 28 U/L (ref 0–53)

## 2017-09-06 LAB — HEMOGLOBIN A1C: Hgb A1c MFr Bld: 6.5 % (ref 4.6–6.5)

## 2017-09-06 LAB — AST: AST: 22 U/L (ref 0–37)

## 2017-09-06 NOTE — Assessment & Plan Note (Signed)
DM: Currently on metformin, check A1c, AST, ALT.  Had an eye check few weeks ago. HTN: Seems well controlled on hydrochlorothiazide.  Check a BMP and EKG DJD: Takes daily meloxicam mostly for hand pain.  Recommend to continue practicing GI precautions, decrease meloxicam to 3 or 4 times a week, and take Tylenol prn.  See instructions.  Checking his kidney function today. Constipation: Okay to take a stool softener as needed ED: New issue, okay to take sildenafil, how to use is on side effects discussed. RTC 01-2018 CPX

## 2017-09-11 ENCOUNTER — Encounter: Payer: Self-pay | Admitting: Gastroenterology

## 2017-09-14 ENCOUNTER — Other Ambulatory Visit: Payer: Self-pay | Admitting: Internal Medicine

## 2017-09-18 ENCOUNTER — Ambulatory Visit (AMBULATORY_SURGERY_CENTER): Payer: BLUE CROSS/BLUE SHIELD | Admitting: Gastroenterology

## 2017-09-18 ENCOUNTER — Encounter: Payer: Self-pay | Admitting: Gastroenterology

## 2017-09-18 ENCOUNTER — Other Ambulatory Visit: Payer: Self-pay

## 2017-09-18 VITALS — BP 110/48 | HR 48 | Temp 97.5°F | Resp 12 | Ht 65.0 in | Wt 215.0 lb

## 2017-09-18 DIAGNOSIS — K5730 Diverticulosis of large intestine without perforation or abscess without bleeding: Secondary | ICD-10-CM

## 2017-09-18 DIAGNOSIS — Z1211 Encounter for screening for malignant neoplasm of colon: Secondary | ICD-10-CM

## 2017-09-18 DIAGNOSIS — Z800 Family history of malignant neoplasm of digestive organs: Secondary | ICD-10-CM | POA: Diagnosis not present

## 2017-09-18 MED ORDER — SODIUM CHLORIDE 0.9 % IV SOLN: 500.0000 mL | Freq: Once | INTRAVENOUS | Status: DC

## 2017-09-18 NOTE — Progress Notes (Signed)
To PACU, VSS. Report to [email or web address] 

## 2017-09-18 NOTE — Patient Instructions (Signed)
YOU HAD AN ENDOSCOPIC PROCEDURE TODAY AT [facility]:   Refer to the procedure report that was given to you for any specific questions about what was found during the examination.  If the procedure report does not answer your questions, please call your gastroenterologist to clarify.  If you requested that your care partner not be given the details of your procedure findings, then the procedure report has been included in a sealed envelope for you to review at your convenience later.  YOU SHOULD EXPECT: Some feelings of bloating in the abdomen. Passage of more gas than usual.  Walking can help get rid of the air that was put into your GI tract during the procedure and reduce the bloating. If you had a lower endoscopy (such as a colonoscopy or flexible sigmoidoscopy) you may notice spotting of blood in your stool or on the toilet paper. If you underwent a bowel prep for your procedure, you may not have a normal bowel movement for a few days.  Please Note:  You might notice some irritation and congestion in your nose or some drainage.  This is from the oxygen used during your procedure.  There is no need for concern and it should clear up in a day or so.  SYMPTOMS TO REPORT IMMEDIATELY:   Following lower endoscopy (colonoscopy or flexible sigmoidoscopy):  Excessive amounts of blood in the stool  Significant tenderness or worsening of abdominal pains  Swelling of the abdomen that is new, acute  Fever of 100F or higher   For urgent or emergent issues, a gastroenterologist can be reached at any hour by calling [phone].   DIET:  We do recommend a small meal at first, but then you may proceed to your regular diet.  Drink plenty of fluids but you should avoid alcoholic beverages for 24 hours. Try to increase the fiber in your diet. ACTIVITY:  You should plan to take it easy for the rest of today and you should NOT DRIVE or use heavy machinery until tomorrow (because of the  sedation medicines used during the test).    FOLLOW UP: Our staff will call the number listed on your records the next business day following your procedure to check on you and address any questions or concerns that you may have regarding the information given to you following your procedure. If we do not reach you, we will leave a message.  However, if you are feeling well and you are not experiencing any problems, there is no need to return our call.  We will assume that you have returned to your regular daily activities without incident.  If any biopsies were taken you will be contacted by phone or by letter within the next 1-3 weeks.  Please call us at [phone] if you have not heard about the biopsies in 3 weeks.    SIGNATURES/CONFIDENTIALITY: You and/or your care partner have signed paperwork which will be entered into your electronic medical record.  These signatures attest to the fact that that the information above on your After Visit Summary has been reviewed and is understood.  Full responsibility of the confidentiality of this discharge information lies with you and/or your care-partner.

## 2017-09-18 NOTE — Progress Notes (Signed)
Pt's states no medical or surgical changes since previsit or office visit. 

## 2017-09-18 NOTE — Op Note (Signed)
Donald Jones Patient Name: Donald Jones Procedure Date: 09/18/2017 7:55 AM MRN: [id number] Endoscopist: Milus Banister , MD Age: 73 Referring MD:  Date of Birth: 08-Apr-1946 Gender: Male Account #: [id number] Procedure:                Colonoscopy Indications:              Screening in patient at increased risk: Family                            history of 1st-degree relative with colorectal                            cancer before age 3 years; sister diagnosed with                            colon cancer in her 60s; colonoscopy 2014 no polyps Medicines:                Monitored Anesthesia Care Procedure:                Pre-Anesthesia Assessment:                           - Prior to the procedure, a History and Physical                            was performed, and patient medications and                            allergies were reviewed. The patient's tolerance of                            previous anesthesia was also reviewed. The risks                            and benefits of the procedure and the sedation                            options and risks were discussed with the patient.                            All questions were answered, and informed consent                            was obtained. Prior Anticoagulants: The patient has                            taken no previous anticoagulant or antiplatelet                            agents. ASA Grade Assessment: II - A patient with                            mild systemic disease. After reviewing the risks  and benefits, the patient was deemed in                            satisfactory condition to undergo the procedure.                           After obtaining informed consent, the colonoscope                            was passed under direct vision. Throughout the                            procedure, the patient's blood pressure, pulse, and                            oxygen saturations  were monitored continuously. The                            Colonoscope was introduced through the anus and                            advanced to the the cecum, identified by                            appendiceal orifice and ileocecal valve. The                            colonoscopy was performed without difficulty. The                            patient tolerated the procedure well. The quality                            of the bowel preparation was excellent. The                            ileocecal valve, appendiceal orifice, and rectum                            were photographed. Scope In: 8:00:31 AM Scope Out: 8:12:54 AM Scope Withdrawal Time: 0 hours 11 minutes 2 seconds  Total Procedure Duration: 0 hours 12 minutes 23 seconds  Findings:                 Multiple small-mouthed diverticula were found in                            the left colon.                           The exam was otherwise without abnormality on                            direct and retroflexion views. Complications:            No immediate complications. Estimated blood  loss:                            None. Estimated Blood Loss:     Estimated blood loss: none. Impression:               - Diverticulosis in the left colon.                           - The examination was otherwise normal on direct                            and retroflexion views.                           - No polyps or cancers. Recommendation:           - Patient has a contact number available for                            emergencies. The signs and symptoms of potential                            delayed complications were discussed with the                            patient. Return to normal activities tomorrow.                            Written discharge instructions were provided to the                            patient.                           - Resume previous diet.                           - Continue present medications.                            - Repeat colonoscopy in 5 years for screening. Milus Banister, MD 09/18/2017 8:16:57 AM This report has been signed electronically.

## 2017-09-19 ENCOUNTER — Telehealth: Payer: Self-pay

## 2017-09-19 NOTE — Telephone Encounter (Signed)
Called pt, no answering machine, no answer.

## 2017-09-19 NOTE — Telephone Encounter (Signed)
  Follow up Call-  Call back number 09/18/2017  Post procedure Call Back phone  # [phone]  Permission to leave phone message Yes  Some recent data might be hidden     Patient questions:  Do you have a fever, pain , or abdominal swelling? No. Pain Score  0 *  Have you tolerated food without any problems? Yes.    Have you been able to return to your normal activities? Yes.    Do you have any questions about your discharge instructions: Diet   No. Medications  No. Follow up visit  No.  Do you have questions or concerns about your Care? No.  Actions: * If pain score is 4 or above: No action needed, pain <4.  No problems noted per pt. maw

## 2017-12-03 ENCOUNTER — Other Ambulatory Visit: Payer: Self-pay | Admitting: Internal Medicine

## 2018-01-04 ENCOUNTER — Encounter: Payer: Self-pay | Admitting: Internal Medicine

## 2018-01-04 ENCOUNTER — Ambulatory Visit: Payer: Self-pay

## 2018-01-04 ENCOUNTER — Ambulatory Visit: Payer: BLUE CROSS/BLUE SHIELD | Admitting: Internal Medicine

## 2018-01-04 VITALS — BP 144/82 | HR 56 | Temp 98.1°F | Resp 16 | Ht 65.0 in | Wt 219.5 lb

## 2018-01-04 DIAGNOSIS — R197 Diarrhea, unspecified: Secondary | ICD-10-CM | POA: Diagnosis not present

## 2018-01-04 DIAGNOSIS — I10 Essential (primary) hypertension: Secondary | ICD-10-CM | POA: Diagnosis not present

## 2018-01-04 LAB — CBC WITH DIFFERENTIAL/PLATELET
Basophils Absolute: 51 cells/uL (ref 0–200)
Basophils Relative: 0.6 %
EOS PCT: 1.3 %
Eosinophils Absolute: 111 cells/uL (ref 15–500)
HCT: 48.4 % (ref 38.5–50.0)
Hemoglobin: 16.4 g/dL (ref 13.2–17.1)
Lymphs Abs: 2482 cells/uL (ref 850–3900)
MCH: 29.4 pg (ref 27.0–33.0)
MCHC: 33.9 g/dL (ref 32.0–36.0)
MCV: 86.7 fL (ref 80.0–100.0)
MONOS PCT: 6.9 %
MPV: 10.2 fL (ref 7.5–12.5)
Neutro Abs: 5270 cells/uL (ref 1500–7800)
Neutrophils Relative %: 62 %
PLATELETS: 262 10*3/uL (ref 140–400)
RBC: 5.58 10*6/uL (ref 4.20–5.80)
RDW: 13.6 % (ref 11.0–15.0)
Total Lymphocyte: 29.2 %
WBC mixed population: 587 cells/uL (ref 200–950)
WBC: 8.5 10*3/uL (ref 3.8–10.8)

## 2018-01-04 LAB — BASIC METABOLIC PANEL
BUN/Creatinine Ratio: 12 (calc) (ref 6–22)
BUN: 15 mg/dL (ref 7–25)
CALCIUM: 10.4 mg/dL — AB (ref 8.6–10.3)
CHLORIDE: 100 mmol/L (ref 98–110)
CO2: 29 mmol/L (ref 20–32)
Creat: 1.21 mg/dL — ABNORMAL HIGH (ref 0.70–1.18)
Glucose, Bld: 111 mg/dL — ABNORMAL HIGH (ref 65–99)
Potassium: 4.5 mmol/L (ref 3.5–5.3)
Sodium: 137 mmol/L (ref 135–146)

## 2018-01-04 NOTE — Patient Instructions (Signed)
GO TO THE LAB : Get the blood work     Check the  blood pressure   daily Be sure your blood pressure is between 110/65 and  135/85. If it is consistently higher or lower, let me know   Follow-up bland diet, drink plenty fluids.  Okay to take Imodium 1 or 2 times   Call if you have more diarrhea    Diarrhea, Adult Diarrhea is when you have loose and water poop (stool) often. Diarrhea can make you feel weak and cause you to get dehydrated. Dehydration can make you tired and thirsty, make you have a dry mouth, and make it so you pee (urinate) less often. Diarrhea often lasts 2-3 days. However, it can last longer if it is a sign of something more serious. It is important to treat your diarrhea as told by your doctor. Follow these instructions at home: Eating and drinking  Follow these recommendations as told by your doctor:  Take an oral rehydration solution (ORS). This is a drink that is sold at pharmacies and stores.  Drink clear fluids, such as: ? Water. ? Ice chips. ? Diluted fruit juice. ? Low-calorie sports drinks.  Eat bland, easy-to-digest foods in small amounts as you are able. These foods include: ? Bananas. ? Applesauce. ? Rice. ? Low-fat (lean) meats. ? Toast. ? Crackers.  Avoid drinking fluids that have a lot of sugar or caffeine in them.  Avoid alcohol.  Avoid spicy or fatty foods.  General instructions   Drink enough fluid to keep your pee (urine) clear or pale yellow.  Wash your hands often. If you cannot use soap and water, use hand sanitizer.  Make sure that all people in your home wash their hands well and often.  Take over-the-counter and prescription medicines only as told by your doctor.  Rest at home while you get better.  Watch your condition for any changes.  Take a warm bath to help with any burning or pain from having diarrhea.  Keep all follow-up visits as told by your doctor. This is important. Contact a doctor if:  You have a  fever.  Your diarrhea gets worse.  You have new symptoms.  You cannot keep fluids down.  You feel light-headed or dizzy.  You have a headache.  You have muscle cramps. Get help right away if:  You have chest pain.  You feel very weak or you pass out (faint).  You have bloody or black poop or poop that look like tar.  You have very bad pain, cramping, or bloating in your belly (abdomen).  You have trouble breathing or you are breathing very quickly.  Your heart is beating very quickly.  Your skin feels cold and clammy.  You feel confused.  You have signs of dehydration, such as: ? Dark pee, hardly any pee, or no pee. ? Cracked lips. ? Dry mouth. ? Sunken eyes. ? Sleepiness. ? Weakness. This information is not intended to replace advice given to you by your health care provider. Make sure you discuss any questions you have with your health care provider. Document Released: 09/20/2007 Document Revised: 10/22/2015 Document Reviewed: 12/08/2014 Elsevier Interactive Patient Education  2018 [employer].

## 2018-01-04 NOTE — Telephone Encounter (Signed)
Pt. Reports he had not been feeling well x 2 days. Stayed home from work today - had a headache and "stomach pain with gas pain and diarrhea." Checked BP with home monitor. Getting erratic readings with diastolic sometimes over 790. Last reading 163/85. No headache at present. Appointment made for today with his provider.   Reason for Disposition . Systolic BP  >= 383 OR Diastolic >= 338  Answer Assessment - Initial Assessment Questions 1. BLOOD PRESSURE: "What is the blood pressure?" "Did you take at least two measurements 5 minutes apart?"     163/85 2. ONSET: "When did you take your blood pressure?"     Today 3. HOW: "How did you obtain the blood pressure?" (e.g., visiting nurse, automatic home BP monitor)     Home blood pressure 4. HISTORY: "Do you have a history of high blood pressure?"     Yes 5. MEDICATIONS: "Are you taking any medications for blood pressure?" "Have you missed any doses recently?"      Yes 6. OTHER SYMPTOMS: "Do you have any symptoms?" (e.g., headache, chest pain, blurred vision, difficulty breathing, weakness)     Felt like I had a stomach bug today - gas pain and some diarrhea today 7. PREGNANCY: "Is there any chance you are pregnant?" "When was your last menstrual period?"     n/a  Protocols used: HIGH BLOOD PRESSURE-A-AH

## 2018-01-04 NOTE — Progress Notes (Signed)
Pre visit review using our clinic review tool, if applicable. No additional management support is needed unless otherwise documented below in the visit note. 

## 2018-01-04 NOTE — Progress Notes (Signed)
Subjective:    Patient ID: Donald Jones, male    DOB: June 25, 1945, 72 y.o.   MRN: [id number]  DOS:  01/04/2018 Type of visit - description : acute Interval history: Symptoms started yesterday, not feeling well.  Runny nose, a lot of gas, increased flatulence and burping. Yesterday, he had 4 bowel movements, they were loose, the last one was watery. This afternoon he had no further diarrhea. Because he was not feeling well he started to check his blood pressure at home, was elevated at 175/115 earlier, later on he recheck and BP was ~ 117, 109. He had a mild headache, took Tylenol, no headache this afternoon.   Review of Systems Denies fever chills No cough or sore throat No nausea or vomiting No chest pain no difficulty breathing.  He is very active at work without difficulties. Mild myalgias?Kermit Balo compliance to medication. Denies feeling orthostatic when he stands up  Past Medical History:  Diagnosis Date  . Arthritis    hands right  . Diabetes mellitus without complication ([place])   . GERD (gastroesophageal reflux disease)   . Hypertension   . Kidney stones    several episodes.  Last one in 2011.    . Obesity    100-lb weight loss since 2011.   . Polycythemia   . Sleep apnea    on Cpap    Past Surgical History:  Procedure Laterality Date  . CHOLECYSTECTOMY    . HAND SURGERY  2010   R hand , d/t a injury, 3 surgeries     Social History   Socioeconomic History  . Marital status: Married    Spouse name: Not on file  . Number of children: 3   . Years of education: Not on file  . Highest education level: Not on file  Occupational History  . 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 Financial resource strain: Not on file  . Food insecurity:    Worry: Not on file    Inability: Not on file  . Transportation needs:    Medical: Not on file    Non-medical: Not on file  Tobacco Use  . Smoking status: Never Smoker  . Smokeless tobacco: Never Used  Substance  and Sexual Activity  . Alcohol use: No    Comment: none for 15 years  . Drug use: No  . Sexual activity: Not Currently  Lifestyle  . Physical activity:    Days per week: Not on file    Minutes per session: Not on file  . Stress: Not on file  Relationships  . Social connections:    Talks on phone: Not on file    Gets together: Not on file    Attends religious service: Not on file    Active member of club or organization: Not on file    Attends meetings of clubs or organizations: Not on file    Relationship status: Not on file  . Intimate partner violence:    Fear of current or ex partner: Not on file    Emotionally abused: Not on file    Physically abused: Not on file    Forced sexual activity: Not on file  Other Topics Concern  . Not on file  Social History Narrative   1ST WIFE DIED FROM LUNG CANCER, 2ND WIFE DIED FROM RENAL CANCER.     Married x 4 , lives w/ wife   3 sisters and 3 brother : lost 2 sisters  Allergies as of 01/04/2018      Reactions   Sulfa Antibiotics Nausea And Vomiting      Medication List        Accurate as of 01/04/18 11:59 PM. Always use your most recent med list.          acetaminophen 325 MG tablet Commonly known as:  TYLENOL Take 650 mg by mouth every 6 (six) hours as needed for pain.   aspirin 81 MG tablet Take 81 mg by mouth daily.   azelastine 0.1 % nasal spray Commonly known as:  ASTELIN Place 2 sprays into both nostrils 2 (two) times daily.   dicyclomine 20 MG tablet Commonly known as:  BENTYL TAKE 1 TABLET BY MOUTH 3 TIMES DAILY BEFORE MEALS.   Flaxseed Oil 1000 MG Caps Take 1,000 mg by mouth daily.   hydrochlorothiazide 25 MG tablet Commonly known as:  HYDRODIURIL Take 1 tablet (25 mg total) by mouth daily.   hydrocortisone 2.5 % cream Apply topically 2 (two) times daily.   meloxicam 7.5 MG tablet Commonly known as:  MOBIC   metFORMIN 500 MG tablet Commonly known as:  GLUCOPHAGE Take 1 tablet (500 mg total)  by mouth 2 (two) times daily with a meal.   multivitamin tablet Take 1 tablet by mouth daily.   omeprazole 40 MG capsule Commonly known as:  PRILOSEC Take 1 capsule (40 mg total) by mouth daily.   sildenafil 20 MG tablet Commonly known as:  REVATIO Take 3-4 tablets (60-80 mg total) by mouth at bedtime as needed.   SINUS BUSTER NA Place 1 puff into the nose daily as needed.          Objective:   Physical Exam BP (!) 144/82 (BP Location: Left Arm, Patient Position: Sitting, Cuff Size: Normal)   Pulse (!) 56   Temp 98.1 F (36.7 C) (Oral)   Resp 16   Ht 5\' 5"  (1.651 m)   Wt 219 lb 8 oz (99.6 kg)   SpO2 98%   BMI 36.53 kg/m  General:   Well developed, NAD, see BMI.  HEENT:  Normocephalic . Face symmetric, atraumatic Lungs:  CTA B Normal respiratory effort, no intercostal retractions, no accessory muscle use. Heart: RRR,  no murmur.  no pretibial edema bilaterally  Abdomen:  Not distended, soft, mild tenderness around the umbilicus without  rebound or rigidity.   Skin: Not pale. Not jaundice Neurologic:  alert & oriented X3.  Speech normal, gait appropriate for age and unassisted Psych--  Cognition and judgment appear intact.  Cooperative with normal attention span and concentration.  Behavior appropriate. No anxious or depressed appearing.     Assessment & Plan:   Assessment   DM  HTN Polycythemia- sees hematology, likely d/t OSA-diuretics, JAK2 (-) ~ 2014, last OV 10-2014, f/u prn OSA on CPAP DJD- saw Dr Rhona Raider before  R Foot drop  GERD IBS? GI sx on and off, previously dx w/ IBS Urolithiasis, several episodes   PLAN: HTN: Slightly elevated today, related to acute problem?.  BP today was 152/80, repeated 144/82;  for now, recommend good hydration, check a BMP and CBC, continue HCTZ, monitor BPs.  Call if readings consistently high. Diarrhea: 4 episodes this morning, none this afternoon, he looks well, not tachycardic.   Recommend good hydration, soft  diet.  Call if severe symptoms

## 2018-01-06 NOTE — Assessment & Plan Note (Signed)
HTN: Slightly elevated today, related to acute problem?.  BP today was 152/80, repeated 144/82;  for now, recommend good hydration, check a BMP and CBC, continue HCTZ, monitor BPs.  Call if readings consistently high. Diarrhea: 4 episodes this morning, none this afternoon, he looks well, not tachycardic.   Recommend good hydration, soft diet.  Call if severe symptoms

## 2018-01-09 ENCOUNTER — Encounter: Payer: Self-pay | Admitting: Internal Medicine

## 2018-01-09 ENCOUNTER — Ambulatory Visit (INDEPENDENT_AMBULATORY_CARE_PROVIDER_SITE_OTHER): Payer: BLUE CROSS/BLUE SHIELD | Admitting: Internal Medicine

## 2018-01-09 VITALS — BP 132/74 | HR 59 | Temp 98.0°F | Resp 14 | Ht 65.0 in | Wt 219.0 lb

## 2018-01-09 DIAGNOSIS — Z0000 Encounter for general adult medical examination without abnormal findings: Secondary | ICD-10-CM

## 2018-01-09 DIAGNOSIS — E119 Type 2 diabetes mellitus without complications: Secondary | ICD-10-CM | POA: Diagnosis not present

## 2018-01-09 DIAGNOSIS — Z23 Encounter for immunization: Secondary | ICD-10-CM | POA: Diagnosis not present

## 2018-01-09 MED ORDER — ZOSTER VAC RECOMB ADJUVANTED 50 MCG/0.5ML IM SUSR: 0.5000 mL | Freq: Once | INTRAMUSCULAR | 1 refills | Status: AC

## 2018-01-09 NOTE — Progress Notes (Signed)
Subjective:    Patient ID: Donald Jones, male    DOB: 1945-09-11, 72 y.o.   MRN: [id number]  DOS:  01/09/2018 Type of visit - description : cpx Interval history: See last visit, was seen with diarrhea and elevated BP. Since then he is doing better.  Has a normal bowel movement in the morning and a couple of loose stools throughout the day but again overall improving. Ambulatory BPs normal since the last visit.  Review of Systems Specifically denies fever, chills.  No nausea, vomiting no blood in the stools.  Appetite is normal.  Other than above, a 14 point review of systems is negative    Past Medical History:  Diagnosis Date  . Arthritis    hands right  . Diabetes mellitus without complication ([place])   . GERD (gastroesophageal reflux disease)   . Hypertension   . Kidney stones    several episodes.  Last one in 2011.    . Obesity    100-lb weight loss since 2011.   . Polycythemia   . Sleep apnea    on Cpap    Past Surgical History:  Procedure Laterality Date  . CHOLECYSTECTOMY    . HAND SURGERY  2010   R hand , d/t a injury, 3 surgeries     Social History   Socioeconomic History  . Marital status: Married    Spouse name: Not on file  . Number of children: 3   . Years of education: Not on file  . Highest education level: Not on file  Occupational History  . 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 Financial resource strain: Not on file  . Food insecurity:    Worry: Not on file    Inability: Not on file  . Transportation needs:    Medical: Not on file    Non-medical: Not on file  Tobacco Use  . Smoking status: Never Smoker  . Smokeless tobacco: Never Used  Substance and Sexual Activity  . Alcohol use: No    Comment: none for 15 years  . Drug use: No  . Sexual activity: Not Currently  Lifestyle  . Physical activity:    Days per week: Not on file    Minutes per session: Not on file  . Stress: Not on file  Relationships  . Social connections:   Talks on phone: Not on file    Gets together: Not on file    Attends religious service: Not on file    Active member of club or organization: Not on file    Attends meetings of clubs or organizations: Not on file    Relationship status: Not on file  . Intimate partner violence:    Fear of current or ex partner: Not on file    Emotionally abused: Not on file    Physically abused: Not on file    Forced sexual activity: Not on file  Other Topics Concern  . Not on file  Social History Narrative   1ST WIFE DIED FROM LUNG CANCER, 2ND WIFE DIED FROM RENAL CANCER.     Married x 4 , lives w/ wife   3 sisters and 3 brother : lost 2 sisters      Family History  Problem Relation Age of Onset  . Diabetes Mother   . Coronary artery disease Mother   . Stroke Father        M and F  . Hypertension Father   . Heart disease Sister        ?  Marland Kitchen  Alcohol abuse Sister   . Colon cancer Sister 21       age ~ 58  . Prostate cancer Neg Hx   . Stomach cancer Neg Hx   . Rectal cancer Neg Hx   . Esophageal cancer Neg Hx      Allergies as of 01/09/2018      Reactions   Sulfa Antibiotics Nausea And Vomiting      Medication List        Accurate as of 01/09/18 11:59 PM. Always use your most recent med list.          acetaminophen 325 MG tablet Commonly known as:  TYLENOL Take 650 mg by mouth every 6 (six) hours as needed for pain.   aspirin 81 MG tablet Take 81 mg by mouth daily.   azelastine 0.1 % nasal spray Commonly known as:  ASTELIN Place 2 sprays into both nostrils 2 (two) times daily.   dicyclomine 20 MG tablet Commonly known as:  BENTYL TAKE 1 TABLET BY MOUTH 3 TIMES DAILY BEFORE MEALS.   Flaxseed Oil 1000 MG Caps Take 1,000 mg by mouth daily.   hydrochlorothiazide 25 MG tablet Commonly known as:  HYDRODIURIL Take 1 tablet (25 mg total) by mouth daily.   hydrocortisone 2.5 % cream Apply topically 2 (two) times daily.   meloxicam 7.5 MG tablet Commonly known as:   MOBIC   metFORMIN 500 MG tablet Commonly known as:  GLUCOPHAGE Take 1 tablet (500 mg total) by mouth 2 (two) times daily with a meal.   multivitamin tablet Take 1 tablet by mouth daily.   omeprazole 40 MG capsule Commonly known as:  PRILOSEC Take 1 capsule (40 mg total) by mouth daily.   sildenafil 20 MG tablet Commonly known as:  REVATIO Take 3-4 tablets (60-80 mg total) by mouth at bedtime as needed.   SINUS BUSTER NA Place 1 puff into the nose daily as needed.   Zoster Vaccine Adjuvanted injection Commonly known as:  SHINGRIX Inject 0.5 mLs into the muscle once for 1 dose.          Objective:   Physical Exam BP 132/74 (BP Location: Left Arm, Patient Position: Sitting, Cuff Size: Small)   Pulse (!) 59   Temp 98 F (36.7 C) (Oral)   Resp 14   Ht 5\' 5"  (1.651 m)   Wt 219 lb (99.3 kg)   SpO2 98%   BMI 36.44 kg/m  General: Well developed, NAD, see BMI.  Neck: No  thyromegaly  HEENT:  Normocephalic . Face symmetric, atraumatic Lungs:  CTA B Normal respiratory effort, no intercostal retractions, no accessory muscle use. Heart: RRR,  no murmur.  No pretibial edema bilaterally  Abdomen:  Not distended, soft, non-tender. No rebound or rigidity.   Rectal: External abnormalities: none. Normal sphincter tone. No rectal masses or tenderness.  Brown stools Prostate: Prostate gland firm and smooth, no enlargement, nodularity, tenderness, mass, asymmetry or induration  Skin: Exposed areas without rash. Not pale. Not jaundice Neurologic:  alert & oriented X3.  Speech normal, gait appropriate for age and unassisted Strength symmetric and appropriate for age.  Psych: Cognition and judgment appear intact.  Cooperative with normal attention span and concentration.  Behavior appropriate. No anxious or depressed appearing.     Assessment & Plan:   Assessment   DM (pre-DM until 2016) HTN Polycythemia- sees hematology, likely d/t OSA-diuretics, JAK2 (-) ~ 2014,  last OV 10-2014, f/u prn OSA on CPAP DJD- saw Dr Rhona Raider before  R Foot drop  GERD IBS? GI sx on and off, previously dx w/ IBS Urolithiasis, several episodes   PLAN: Diarrhea: See last visit, improving.  Recommend bland diet and give it more time. DM: Continue metformin, check a A1c.  Ambulatory CBGs usually 110, 119. HTN: Was a slightly elevated the last time he was here, BMP was okay, BP today is good.  Continue hydrochlorothiazide. Polycythemia: History of, last CBC normal OSA: On CPAP. RTC 6 months

## 2018-01-09 NOTE — Assessment & Plan Note (Signed)
-  Td 2018, pneumonia shot 2014; prevnar: 01-2015; Shingles shot 2011;   shingrix rx printed;  flu shot today   -CCS: cscope ~ 2008 in [place],  [place] again 05-2012, Cscope 09/2017, next per GI  Dr Ardis Hughs  - prostrate ca screening: DRE wnl, check a psa  - labs: FLP A1C PSA - diet exercise discussed

## 2018-01-09 NOTE — Progress Notes (Signed)
Pre visit review using our clinic review tool, if applicable. No additional management support is needed unless otherwise documented below in the visit note. 

## 2018-01-09 NOTE — Patient Instructions (Signed)
GO TO THE LAB : Get the blood work     GO TO THE FRONT DESK Schedule your next appointment for a  routine checkup in 6 months  

## 2018-01-10 LAB — HEMOGLOBIN A1C: Hgb A1c MFr Bld: 6.8 % — ABNORMAL HIGH (ref 4.6–6.5)

## 2018-01-10 LAB — LIPID PANEL
Cholesterol: 173 mg/dL (ref 0–200)
HDL: 46.1 mg/dL
LDL Cholesterol: 105 mg/dL — ABNORMAL HIGH (ref 0–99)
NonHDL: 126.62
Total CHOL/HDL Ratio: 4
Triglycerides: 106 mg/dL (ref 0.0–149.0)
VLDL: 21.2 mg/dL (ref 0.0–40.0)

## 2018-01-10 LAB — PSA: PSA: 2.52 ng/mL (ref 0.10–4.00)

## 2018-01-10 NOTE — Assessment & Plan Note (Signed)
Diarrhea: See last visit, improving.  Recommend bland diet and give it more time. DM: Continue metformin, check a A1c.  Ambulatory CBGs usually 110, 119. HTN: Was a slightly elevated the last time he was here, BMP was okay, BP today is good.  Continue hydrochlorothiazide. Polycythemia: History of, last CBC normal OSA: On CPAP. RTC 6 months

## 2018-01-18 ENCOUNTER — Encounter: Payer: Self-pay | Admitting: Internal Medicine

## 2018-01-18 ENCOUNTER — Ambulatory Visit: Payer: BLUE CROSS/BLUE SHIELD | Admitting: Internal Medicine

## 2018-01-18 VITALS — BP 126/62 | HR 65 | Temp 98.5°F | Resp 16 | Ht 65.0 in | Wt 216.2 lb

## 2018-01-18 DIAGNOSIS — E119 Type 2 diabetes mellitus without complications: Secondary | ICD-10-CM | POA: Diagnosis not present

## 2018-01-18 DIAGNOSIS — R197 Diarrhea, unspecified: Secondary | ICD-10-CM

## 2018-01-18 DIAGNOSIS — R1013 Epigastric pain: Secondary | ICD-10-CM

## 2018-01-18 DIAGNOSIS — E7800 Pure hypercholesterolemia, unspecified: Secondary | ICD-10-CM | POA: Diagnosis not present

## 2018-01-18 MED ORDER — ATORVASTATIN CALCIUM 40 MG PO TABS: 40.0000 mg | ORAL_TABLET | Freq: Every day | ORAL | 1 refills | Status: DC

## 2018-01-18 NOTE — Patient Instructions (Addendum)
Go today lab and get a container, bring a stool sample  Stop metformin for 2 weeks  Omeprazole: Increase to 1 tablet twice a day on a empty stomach for the next 2 weeks  Follow-up bland diet  Get a probiotic OTC: 1 tablet daily (align)  If you have severe symptoms, severe chest pain leg me know.  Next visit in 4 weeks   Diarrhea, Adult Diarrhea is when you have loose and water poop (stool) often. Diarrhea can make you feel weak and cause you to get dehydrated. Dehydration can make you tired and thirsty, make you have a dry mouth, and make it so you pee (urinate) less often. Diarrhea often lasts 2-3 days. However, it can last longer if it is a sign of something more serious. It is important to treat your diarrhea as told by your doctor. Follow these instructions at home: Eating and drinking  Follow these recommendations as told by your doctor:  Take an oral rehydration solution (ORS). This is a drink that is sold at pharmacies and stores.  Drink clear fluids, such as: ? Water. ? Ice chips. ? Diluted fruit juice. ? Low-calorie sports drinks.  Eat bland, easy-to-digest foods in small amounts as you are able. These foods include: ? Bananas. ? Applesauce. ? Rice. ? Low-fat (lean) meats. ? Toast. ? Crackers.  Avoid drinking fluids that have a lot of sugar or caffeine in them.  Avoid alcohol.  Avoid spicy or fatty foods.  General instructions   Drink enough fluid to keep your pee (urine) clear or pale yellow.  Wash your hands often. If you cannot use soap and water, use hand sanitizer.  Make sure that all people in your home wash their hands well and often.  Take over-the-counter and prescription medicines only as told by your doctor.  Rest at home while you get better.  Watch your condition for any changes.  Take a warm bath to help with any burning or pain from having diarrhea.  Keep all follow-up visits as told by your doctor. This is important. Contact a  doctor if:  You have a fever.  Your diarrhea gets worse.  You have new symptoms.  You cannot keep fluids down.  You feel light-headed or dizzy.  You have a headache.  You have muscle cramps. Get help right away if:  You have chest pain.  You feel very weak or you pass out (faint).  You have bloody or black poop or poop that look like tar.  You have very bad pain, cramping, or bloating in your belly (abdomen).  You have trouble breathing or you are breathing very quickly.  Your heart is beating very quickly.  Your skin feels cold and clammy.  You feel confused.  You have signs of dehydration, such as: ? Dark pee, hardly any pee, or no pee. ? Cracked lips. ? Dry mouth. ? Sunken eyes. ? Sleepiness. ? Weakness. This information is not intended to replace advice given to you by your health care provider. Make sure you discuss any questions you have with your health care provider. Document Released: 09/20/2007 Document Revised: 10/22/2015 Document Reviewed: 12/08/2014 Elsevier Interactive Patient Education  2018 [employer].

## 2018-01-18 NOTE — Progress Notes (Signed)
Pre visit review using our clinic review tool, if applicable. No additional management support is needed unless otherwise documented below in the visit note. 

## 2018-01-18 NOTE — Progress Notes (Signed)
Subjective:    Patient ID: Donald Jones, male    DOB: 03/26/1946, 72 y.o.   MRN: [id number]  DOS:  01/18/2018 Type of visit - description : acute Interval history: Continue with GI symptoms: Loose stools up to 4 times a day, nonbloody. Usually after a meal. A lot of "gas": Burping, flatulence.  Burping feels acid. Also has developed right shoulder pain after he eats, decreased with burping.  No left shoulder or arm pain. Some LLQ abdominal discomfort that decreased after the bowel movement. No RUQ pain. Wonders if some of the symptoms are related with metformin. Has not eaten in any unusual places, no recent antibiotic intake.   Review of Systems  Denies fever chills although when he works outside in the heat he feels a slightly clammy. No nausea or vomiting.  Appetite is normal. No SSCP, specifically no chest pain or difficulty breathing when active at work. No palpitations.  Past Medical History:  Diagnosis Date  . Arthritis    hands right  . Diabetes mellitus without complication ([place])   . GERD (gastroesophageal reflux disease)   . Hypertension   . Kidney stones    several episodes.  Last one in 2011.    . Obesity    100-lb weight loss since 2011.   . Polycythemia   . Sleep apnea    on Cpap    Past Surgical History:  Procedure Laterality Date  . CHOLECYSTECTOMY    . HAND SURGERY  2010   R hand , d/t a injury, 3 surgeries     Social History   Socioeconomic History  . Marital status: Married    Spouse name: Not on file  . Number of children: 3   . Years of education: Not on file  . Highest education level: Not on file  Occupational History  . 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 Financial resource strain: Not on file  . Food insecurity:    Worry: Not on file    Inability: Not on file  . Transportation needs:    Medical: Not on file    Non-medical: Not on file  Tobacco Use  . Smoking status: Never Smoker  . Smokeless tobacco: Never Used    Substance and Sexual Activity  . Alcohol use: No    Comment: none for 15 years  . Drug use: No  . Sexual activity: Not Currently  Lifestyle  . Physical activity:    Days per week: Not on file    Minutes per session: Not on file  . Stress: Not on file  Relationships  . Social connections:    Talks on phone: Not on file    Gets together: Not on file    Attends religious service: Not on file    Active member of club or organization: Not on file    Attends meetings of clubs or organizations: Not on file    Relationship status: Not on file  . Intimate partner violence:    Fear of current or ex partner: Not on file    Emotionally abused: Not on file    Physically abused: Not on file    Forced sexual activity: Not on file  Other Topics Concern  . Not on file  Social History Narrative   1ST WIFE DIED FROM LUNG CANCER, 2ND WIFE DIED FROM RENAL CANCER.     Married x 4 , lives w/ wife   3 sisters and 3 brother : lost 2 sisters  Allergies as of 01/18/2018      Reactions   Sulfa Antibiotics Nausea And Vomiting      Medication List        Accurate as of 01/18/18 11:59 PM. Always use your most recent med list.          acetaminophen 325 MG tablet Commonly known as:  TYLENOL Take 650 mg by mouth every 6 (six) hours as needed for pain.   aspirin 81 MG tablet Take 81 mg by mouth daily.   atorvastatin 40 MG tablet Commonly known as:  LIPITOR Take 1 tablet (40 mg total) by mouth at bedtime.   azelastine 0.1 % nasal spray Commonly known as:  ASTELIN Place 2 sprays into both nostrils 2 (two) times daily.   dicyclomine 20 MG tablet Commonly known as:  BENTYL TAKE 1 TABLET BY MOUTH 3 TIMES DAILY BEFORE MEALS.   Flaxseed Oil 1000 MG Caps Take 1,000 mg by mouth daily.   hydrochlorothiazide 25 MG tablet Commonly known as:  HYDRODIURIL Take 1 tablet (25 mg total) by mouth daily.   hydrocortisone 2.5 % cream Apply topically 2 (two) times daily.   meloxicam 7.5 MG  tablet Commonly known as:  MOBIC   metFORMIN 500 MG tablet Commonly known as:  GLUCOPHAGE Take 1 tablet (500 mg total) by mouth 2 (two) times daily with a meal.   multivitamin tablet Take 1 tablet by mouth daily.   omeprazole 40 MG capsule Commonly known as:  PRILOSEC Take 1 capsule (40 mg total) by mouth daily.   sildenafil 20 MG tablet Commonly known as:  REVATIO Take 3-4 tablets (60-80 mg total) by mouth at bedtime as needed.   SINUS BUSTER NA Place 1 puff into the nose daily as needed.          Objective:   Physical Exam BP 126/62 (BP Location: Left Arm, Patient Position: Sitting, Cuff Size: Small)   Pulse 65   Temp 98.5 F (36.9 C) (Oral)   Resp 16   Ht 5\' 5"  (1.651 m)   Wt 216 lb 4 oz (98.1 kg)   SpO2 96%   BMI 35.99 kg/m  General:   Well developed, NAD, see BMI.  HEENT:  Normocephalic . Face symmetric, atraumatic Lungs:  CTA B Normal respiratory effort, no intercostal retractions, no accessory muscle use. Heart: RRR,  no murmur.  no pretibial edema bilaterally  Abdomen:  Not distended, soft, non-tender. No rebound or rigidity.  Specifically, no pain to deep palpation at the RUQ. Skin: Not pale. Not jaundice Neurologic:  alert & oriented X3.  Speech normal, gait appropriate for age and unassisted Psych--  Cognition and judgment appear intact.  Cooperative with normal attention span and concentration.  Behavior appropriate. No anxious or depressed appearing.     Assessment & Plan:   Assessment   DM (pre-DM until 2016) HTN Polycythemia- sees hematology, likely d/t OSA-diuretics, JAK2 (-) ~ 2014, last OV 10-2014, f/u prn OSA on CPAP DJD- saw Dr Rhona Raider before  R Foot drop  GERD IBS? GI sx on and off, previously dx w/ IBS Urolithiasis, several episodes   PLAN: Diarrhea, dyspepsia: Started approximately 9/20, as described above, CBC was normal.  No recent antibiotic, abdominal exam normal.  He has multiple CV RF, EKG today sinus rhythm, doubt  angina equivalent.  He also had polycythemia but last hemoglobin was within normal. Plan: Stool studies: WBCs, C. difficile, culture. increase temporarily omeprazole to twice daily, probiotics. Brat diet. DM: Hold metformin for 2 weeks  High cholesterol: LDL 105, diagnosed recommend statins,: Lipitor 40, patient already agreed.  Check LFTs and FLP on RTC See instructions RTC 4 weeks

## 2018-01-20 DIAGNOSIS — E7800 Pure hypercholesterolemia, unspecified: Secondary | ICD-10-CM | POA: Insufficient documentation

## 2018-01-20 NOTE — Assessment & Plan Note (Signed)
Diarrhea, dyspepsia: Started approximately 9/20, as described above, CBC was normal.  No recent antibiotic, abdominal exam normal.  He has multiple CV RF, EKG today sinus rhythm, doubt angina equivalent.  He also had polycythemia but last hemoglobin was within normal. Plan: Stool studies: WBCs, C. difficile, culture. increase temporarily omeprazole to twice daily, probiotics. Brat diet. DM: Hold metformin for 2 weeks High cholesterol: LDL 105, diagnosed recommend statins,: Lipitor 40, patient already agreed.  Check LFTs and FLP on RTC See instructions RTC 4 weeks

## 2018-01-28 ENCOUNTER — Encounter: Payer: Self-pay | Admitting: Internal Medicine

## 2018-01-28 ENCOUNTER — Ambulatory Visit: Payer: BLUE CROSS/BLUE SHIELD | Admitting: Internal Medicine

## 2018-01-28 VITALS — BP 134/68 | HR 58 | Temp 97.6°F | Resp 16 | Ht 65.0 in | Wt 214.0 lb

## 2018-01-28 DIAGNOSIS — R079 Chest pain, unspecified: Secondary | ICD-10-CM

## 2018-01-28 DIAGNOSIS — R197 Diarrhea, unspecified: Secondary | ICD-10-CM

## 2018-01-28 NOTE — Patient Instructions (Signed)
  GO TO THE FRONT DESK Schedule your next appointment for a  Check up in 3 months  Go back on metformin  Prilosec 1 time a day only in the morning before breakfast

## 2018-01-28 NOTE — Progress Notes (Signed)
Pre visit review using our clinic review tool, if applicable. No additional management support is needed unless otherwise documented below in the visit note. 

## 2018-01-28 NOTE — Assessment & Plan Note (Signed)
Diarrhea, dyspepsia: Ongoing symptoms, stopping metformin did not help.  Concerned about the right-sided chest pain being cardiac related. Although on clinical grounds that is unlikely, he has multiple CVRFs.   --As far as the diarrhea: Stool samples and GI eval are pending.   --As far as R sided CP: Cardiology referral, stress test?  Calcium coronary score?.  ER if symptoms severe. Okay to go back down to Prilosec 1 tab daily DM: Okay to restart metformin, metformin doesn't seem to be related to GI symptoms RTC 3 months

## 2018-01-28 NOTE — Progress Notes (Signed)
Subjective:    Patient ID: Donald Jones, male    DOB: 10/19/1945, 72 y.o.   MRN: [id number]  DOS:  01/28/2018 Type of visit - description : Acute, here with his wife Interval history: see last OV Continue with GI symptoms including loose stools several times a day despite stopping metformin. Symptoms include a "gas" feeling at the right side of the chest, usually after eating. Patient is concerned about this chest pain being cardiac related    Review of Systems No chest pain with exertion No fever chills No blood in the stools  Past Medical History:  Diagnosis Date  . Arthritis    hands right  . Diabetes mellitus without complication ([place])   . GERD (gastroesophageal reflux disease)   . Hypertension   . Kidney stones    several episodes.  Last one in 2011.    . Obesity    100-lb weight loss since 2011.   . Polycythemia   . Sleep apnea    on Cpap    Past Surgical History:  Procedure Laterality Date  . CHOLECYSTECTOMY    . HAND SURGERY  2010   R hand , d/t a injury, 3 surgeries     Social History   Socioeconomic History  . Marital status: Married    Spouse name: Not on file  . Number of children: 3   . Years of education: Not on file  . Highest education level: Not on file  Occupational History  . 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 Financial resource strain: Not on file  . Food insecurity:    Worry: Not on file    Inability: Not on file  . Transportation needs:    Medical: Not on file    Non-medical: Not on file  Tobacco Use  . Smoking status: Never Smoker  . Smokeless tobacco: Never Used  Substance and Sexual Activity  . Alcohol use: No    Comment: none for 15 years  . Drug use: No  . Sexual activity: Not Currently  Lifestyle  . Physical activity:    Days per week: Not on file    Minutes per session: Not on file  . Stress: Not on file  Relationships  . Social connections:    Talks on phone: Not on file    Gets together: Not on file    Attends religious service: Not on file    Active member of club or organization: Not on file    Attends meetings of clubs or organizations: Not on file    Relationship status: Not on file  . Intimate partner violence:    Fear of current or ex partner: Not on file    Emotionally abused: Not on file    Physically abused: Not on file    Forced sexual activity: Not on file  Other Topics Concern  . Not on file  Social History Narrative   1ST WIFE DIED FROM LUNG CANCER, 2ND WIFE DIED FROM RENAL CANCER.     Married x 4 , lives w/ wife   3 sisters and 3 brother : lost 2 sisters       Allergies as of 01/28/2018      Reactions   Sulfa Antibiotics Nausea And Vomiting      Medication List        Accurate as of 01/28/18  1:07 PM. Always use your most recent med list.          acetaminophen 325 MG tablet Commonly  known as:  TYLENOL Take 650 mg by mouth every 6 (six) hours as needed for pain.   aspirin 81 MG tablet Take 81 mg by mouth daily.   atorvastatin 40 MG tablet Commonly known as:  LIPITOR Take 1 tablet (40 mg total) by mouth at bedtime.   azelastine 0.1 % nasal spray Commonly known as:  ASTELIN Place 2 sprays into both nostrils 2 (two) times daily.   dicyclomine 20 MG tablet Commonly known as:  BENTYL TAKE 1 TABLET BY MOUTH 3 TIMES DAILY BEFORE MEALS.   Flaxseed Oil 1000 MG Caps Take 1,000 mg by mouth daily.   hydrochlorothiazide 25 MG tablet Commonly known as:  HYDRODIURIL Take 1 tablet (25 mg total) by mouth daily.   hydrocortisone 2.5 % cream Apply topically 2 (two) times daily.   meloxicam 7.5 MG tablet Commonly known as:  MOBIC   metFORMIN 500 MG tablet Commonly known as:  GLUCOPHAGE Take 1 tablet (500 mg total) by mouth 2 (two) times daily with a meal.   multivitamin tablet Take 1 tablet by mouth daily.   omeprazole 40 MG capsule Commonly known as:  PRILOSEC Take 1 capsule (40 mg total) by mouth daily.   sildenafil 20 MG tablet Commonly known  as:  REVATIO Take 3-4 tablets (60-80 mg total) by mouth at bedtime as needed.   SINUS BUSTER NA Place 1 puff into the nose daily as needed.          Objective:   Physical Exam BP 134/68 (BP Location: Left Arm, Patient Position: Sitting, Cuff Size: Normal)   Pulse (!) 58   Temp 97.6 F (36.4 C) (Oral)   Resp 16   Ht 5\' 5"  (1.651 m)   Wt 214 lb (97.1 kg)   SpO2 98%   BMI 35.61 kg/m  General:   Well developed, NAD, see BMI.  HEENT:  Normocephalic . Face symmetric, atraumatic Lungs:  CTA B Normal respiratory effort, no intercostal retractions, no accessory muscle use. Heart: RRR,  no murmur.  No pretibial edema bilaterally  Abdomen: Soft, nontender Skin: Not pale. Not jaundice Neurologic:  alert & oriented X3.  Speech normal, gait appropriate for age and unassisted Psych--  Cognition and judgment appear intact.  Cooperative with normal attention span and concentration.  Behavior appropriate. No anxious or depressed appearing.      Assessment & Plan:   Assessment   DM (pre-DM until 2016) HTN Polycythemia- sees hematology, likely d/t OSA-diuretics, JAK2 (-) ~ 2014, last OV 10-2014, f/u prn OSA on CPAP DJD- saw Dr Rhona Raider before  R Foot drop  GERD IBS? GI sx on and off, previously dx w/ IBS Urolithiasis, several episodes   PLAN: Diarrhea, dyspepsia: Ongoing symptoms, stopping metformin did not help.  Concerned about the right-sided chest pain being cardiac related. Although on clinical grounds that is unlikely, he has multiple CVRFs.   --As far as the diarrhea: Stool samples and GI eval are pending.   --As far as R sided CP: Cardiology referral, stress test?  Calcium coronary score?.  ER if symptoms severe. Okay to go back down to Prilosec 1 tab daily DM: Okay to restart metformin, metformin doesn't seem to be related to GI symptoms RTC 3 months

## 2018-01-31 LAB — STOOL CULTURE
MICRO NUMBER: 91232113
MICRO NUMBER:: 91232112
MICRO NUMBER:: 91232114
SHIGA RESULT:: NOT DETECTED
SPECIMEN QUALITY: ADEQUATE
SPECIMEN QUALITY: ADEQUATE
SPECIMEN QUALITY:: ADEQUATE

## 2018-03-06 ENCOUNTER — Ambulatory Visit (HOSPITAL_BASED_OUTPATIENT_CLINIC_OR_DEPARTMENT_OTHER)
Admission: RE | Admit: 2018-03-06 | Discharge: 2018-03-06 | Disposition: A | Discharge status: Home / Self Care | Payer: BLUE CROSS/BLUE SHIELD | Source: Ambulatory Visit | Attending: Internal Medicine | Admitting: Internal Medicine

## 2018-03-06 ENCOUNTER — Ambulatory Visit: Payer: BLUE CROSS/BLUE SHIELD | Admitting: Internal Medicine

## 2018-03-06 ENCOUNTER — Encounter: Payer: Self-pay | Admitting: Internal Medicine

## 2018-03-06 VITALS — BP 136/78 | HR 54 | Temp 98.0°F | Resp 16 | Ht 65.0 in | Wt 217.5 lb

## 2018-03-06 DIAGNOSIS — E7800 Pure hypercholesterolemia, unspecified: Secondary | ICD-10-CM | POA: Diagnosis not present

## 2018-03-06 DIAGNOSIS — S6991XA Unspecified injury of right wrist, hand and finger(s), initial encounter: Secondary | ICD-10-CM

## 2018-03-06 DIAGNOSIS — M24841 Other specific joint derangements of right hand, not elsewhere classified: Secondary | ICD-10-CM | POA: Insufficient documentation

## 2018-03-06 DIAGNOSIS — M79644 Pain in right finger(s): Secondary | ICD-10-CM | POA: Insufficient documentation

## 2018-03-06 DIAGNOSIS — R197 Diarrhea, unspecified: Secondary | ICD-10-CM

## 2018-03-06 DIAGNOSIS — M7989 Other specified soft tissue disorders: Secondary | ICD-10-CM | POA: Diagnosis not present

## 2018-03-06 DIAGNOSIS — E119 Type 2 diabetes mellitus without complications: Secondary | ICD-10-CM

## 2018-03-06 LAB — MICROALBUMIN / CREATININE URINE RATIO
CREATININE, U: 127.3 mg/dL
Microalb Creat Ratio: 0.6 mg/g (ref 0.0–30.0)
Microalb, Ur: 0.7 mg/dL (ref 0.0–1.9)

## 2018-03-06 LAB — AST: AST: 24 U/L (ref 0–37)

## 2018-03-06 LAB — LIPID PANEL
CHOL/HDL RATIO: 2
Cholesterol: 109 mg/dL (ref 0–200)
HDL: 44.9 mg/dL (ref >39.00)
LDL CALC: 53 mg/dL (ref 0–99)
NonHDL: 64.11
TRIGLYCERIDES: 54 mg/dL (ref 0.0–149.0)
VLDL: 10.8 mg/dL (ref 0.0–40.0)

## 2018-03-06 LAB — ALT: ALT: 31 U/L (ref 0–53)

## 2018-03-06 NOTE — Progress Notes (Signed)
Subjective:    Patient ID: Donald Jones, male    DOB: [birth year], 72 y.o.   MRN: [id number]  DOS:  03/06/2018 Type of visit - description : f/u Follow-up from previous visit GI: Continue with frequent BMs, stools are no watery or loose anymore.  Symptoms usually triggered by eating meat according to the patient. Chest pain: See last visit, improved, still have some "gas". High cholesterol: Good compliance with Lipitor. DM: Good compliance with metformin Also, injured his right middle finger last week, pain & swelling decreased, range of motion is still limited.  Review of Systems Denies shortness of breath, occasional cough. Previously he reported chest pain, that is better, still have some "gas in the chest".  No nausea, vomiting or heartburn.  Past Medical History:  Diagnosis Date  . Arthritis    hands right  . Diabetes mellitus without complication ([place])   . GERD (gastroesophageal reflux disease)   . Hypertension   . Kidney stones    several episodes.  Last one in 2011.    . Obesity    100-lb weight loss since 2011.   . Polycythemia   . Sleep apnea    on Cpap    Past Surgical History:  Procedure Laterality Date  . CHOLECYSTECTOMY    . HAND SURGERY  2010   R hand , d/t a injury, 3 surgeries     Social History   Socioeconomic History  . Marital status: Married    Spouse name: Not on file  . Number of children: 3   . Years of education: Not on file  . Highest education level: Not on file  Occupational History  . 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 Financial resource strain: Not on file  . Food insecurity:    Worry: Not on file    Inability: Not on file  . Transportation needs:    Medical: Not on file    Non-medical: Not on file  Tobacco Use  . Smoking status: Never Smoker  . Smokeless tobacco: Never Used  Substance and Sexual Activity  . Alcohol use: No    Comment: none for 15 years  . Drug use: No  . Sexual activity: Not Currently    Lifestyle  . Physical activity:    Days per week: Not on file    Minutes per session: Not on file  . Stress: Not on file  Relationships  . Social connections:    Talks on phone: Not on file    Gets together: Not on file    Attends religious service: Not on file    Active member of club or organization: Not on file    Attends meetings of clubs or organizations: Not on file    Relationship status: Not on file  . Intimate partner violence:    Fear of current or ex partner: Not on file    Emotionally abused: Not on file    Physically abused: Not on file    Forced sexual activity: Not on file  Other Topics Concern  . Not on file  Social History Narrative   1ST WIFE DIED FROM LUNG CANCER, 2ND WIFE DIED FROM RENAL CANCER.     Married x 4 , lives w/ wife   3 sisters and 3 brother : lost 2 sisters       Allergies as of 03/06/2018      Reactions   Sulfa Antibiotics Nausea And Vomiting      Medication List  Accurate as of 03/06/18  6:11 PM. Always use your most recent med list.          acetaminophen 325 MG tablet Commonly known as:  TYLENOL Take 650 mg by mouth every 6 (six) hours as needed for pain.   aspirin 81 MG tablet Take 81 mg by mouth daily.   atorvastatin 40 MG tablet Commonly known as:  LIPITOR Take 1 tablet (40 mg total) by mouth at bedtime.   azelastine 0.1 % nasal spray Commonly known as:  ASTELIN Place 2 sprays into both nostrils 2 (two) times daily.   dicyclomine 20 MG tablet Commonly known as:  BENTYL TAKE 1 TABLET BY MOUTH 3 TIMES DAILY BEFORE MEALS.   Flaxseed Oil 1000 MG Caps Take 1,000 mg by mouth daily.   hydrochlorothiazide 25 MG tablet Commonly known as:  HYDRODIURIL Take 1 tablet (25 mg total) by mouth daily.   hydrocortisone 2.5 % cream Apply topically 2 (two) times daily.   meloxicam 7.5 MG tablet Commonly known as:  MOBIC   metFORMIN 500 MG tablet Commonly known as:  GLUCOPHAGE Take 1 tablet (500 mg total) by mouth 2  (two) times daily with a meal.   multivitamin tablet Take 1 tablet by mouth daily.   omeprazole 40 MG capsule Commonly known as:  PRILOSEC Take 1 capsule (40 mg total) by mouth daily.   sildenafil 20 MG tablet Commonly known as:  REVATIO Take 3-4 tablets (60-80 mg total) by mouth at bedtime as needed.   SINUS BUSTER NA Place 1 puff into the nose daily as needed.           Objective:   Physical Exam BP 136/78 (BP Location: Left Arm, Patient Position: Sitting, Cuff Size: Small)   Pulse (!) 54   Temp 98 F (36.7 C) (Oral)   Resp 16   Ht 5\' 5"  (1.651 m)   Wt 217 lb 8 oz (98.7 kg)   SpO2 96%   BMI 36.19 kg/m  General:   Well developed, NAD, BMI noted.  HEENT:  Normocephalic . Face symmetric, atraumatic Lungs:  CTA B Normal respiratory effort, no intercostal retractions, no accessory muscle use. Heart: RRR,  no murmur.  no pretibial edema bilaterally  Abdomen:  Not distended, soft, non-tender.  MSK: Right middle finger: Slightly swelling, PIP range of motion decreased.  Slightly TTP.  Not red or warm.  No obvious deformities.   Skin: Not pale. Not jaundice Neurologic:  alert & oriented X3.  Speech normal, gait appropriate for age and unassisted Psych--  Cognition and judgment appear intact.  Cooperative with normal attention span and concentration.  Behavior appropriate. No anxious or depressed appearing.     Assessment & Plan:    Assessment   DM (pre-DM until 2016) HTN Polycythemia- sees hematology, likely d/t OSA-diuretics, JAK2 (-) ~ 2014, last OV 10-2014, f/u prn OSA on CPAP DJD- saw Dr Rhona Raider before  R Foot drop  GERD IBS? GI sx on and off, previously dx w/ IBS Urolithiasis, several episodes   PLAN: "Diarrhea", dyspepsia: Stool studies were negative, C diff not done.  Currently with no loose stools but rather frequent BMs.  Etiology unclear, will see GI soon. "Chest pain" resolved, she still has some "gas" and  ill-defined dyspepsia, has multiple  CV RF's, will see cardiology in few weeks. High cholesterol: Started atorvastatin 12-2017, checking a FLP, AST ALT (no fasting) Right finger injury: We will get a x-ray to rule out fracture, patient plans to call Dr. Rhona Raider,  his orthopedic doctor RTC 4 to 5 months, routine checkup

## 2018-03-06 NOTE — Assessment & Plan Note (Signed)
"  Diarrhea", dyspepsia: Stool studies were negative, C diff not done.  Currently with no loose stools but rather frequent BMs.  Etiology unclear, will see GI soon. "Chest pain" resolved, she still has some "gas" and  ill-defined dyspepsia, has multiple CV RF's, will see cardiology in few weeks. High cholesterol: Started atorvastatin 12-2017, checking a FLP, AST ALT (no fasting) Right finger injury: We will get a x-ray to rule out fracture, patient plans to call Dr. Rhona Raider, his orthopedic doctor RTC 4 to 5 months, routine checkup

## 2018-03-06 NOTE — Patient Instructions (Addendum)
Please schedule Medicare Wellness with Glenard Haring.   GO TO THE LAB : Get the blood work    GO TO THE FRONT DESK Schedule your next appointment for a  Check up in 4-5 months    STOP BY THE FIRST FLOOR:  get the XR

## 2018-03-06 NOTE — Progress Notes (Signed)
Pre visit review using our clinic review tool, if applicable. No additional management support is needed unless otherwise documented below in the visit note. 

## 2018-03-11 ENCOUNTER — Encounter: Payer: Self-pay | Admitting: Gastroenterology

## 2018-03-11 ENCOUNTER — Ambulatory Visit: Payer: BLUE CROSS/BLUE SHIELD | Admitting: Gastroenterology

## 2018-03-11 VITALS — BP 146/76 | HR 70 | Ht 65.0 in | Wt 217.0 lb

## 2018-03-11 DIAGNOSIS — R1013 Epigastric pain: Secondary | ICD-10-CM

## 2018-03-11 NOTE — Progress Notes (Signed)
Review of pertinent gastrointestinal problems: 1. Elevated risk for colon cancer given FH of colon cancer (sister diagnosed in her 72s).  Colonoscopy 2014 no polyps. Colonoscopy 09/2017 Dr. Ardis Hughs + diverticulosis, no polyps, recommended recall at 5 years.   HPI: This is a  Very pleasant 72 yo man  who was referred to me by Colon Branch, MD  to evaluate  dyspepsia  Chief complaint is dyspsia  Corn, beans ; "anything with a shell"  Causes feeling of food sitting in his stomach for about an hour. He can have gas pains in his chest.  If he eats any red meats also he will feel the same symptom.  NO dysphagia.  Passing gas helps (flatus) usually  Overall he thinks he's losing weight.  Takes prilosec every day.  Takes tyelenol, baby ASA daily, never NSIADs  Symptoms have been going on for several months.     Review of systems: Pertinent positive and negative review of systems were noted in the above HPI section. All other review negative.   Past Medical History:  Diagnosis Date  . Arthritis    hands right  . Diabetes mellitus without complication ([place])   . GERD (gastroesophageal reflux disease)   . Hypertension   . Kidney stones    several episodes.  Last one in 2011.    . Obesity    100-lb weight loss since 2011.   . Polycythemia   . Sleep apnea    on Cpap    Past Surgical History:  Procedure Laterality Date  . CHOLECYSTECTOMY    . HAND SURGERY  2010   R hand , d/t a injury, 3 surgeries     Current Outpatient Medications  Medication Sig Dispense Refill  . acetaminophen (TYLENOL) 325 MG tablet Take 650 mg by mouth every 6 (six) hours as needed for pain.     Marland Kitchen aspirin 81 MG tablet Take 81 mg by mouth daily.    Marland Kitchen atorvastatin (LIPITOR) 40 MG tablet Take 1 tablet (40 mg total) by mouth at bedtime. 30 tablet 1  . azelastine (ASTELIN) 0.1 % nasal spray Place 2 sprays into both nostrils 2 (two) times daily. 30 mL 6  . dicyclomine (BENTYL) 20 MG tablet TAKE 1 TABLET BY  MOUTH 3 TIMES DAILY BEFORE MEALS. 270 tablet 1  . Flaxseed, Linseed, (FLAXSEED OIL) 1000 MG CAPS Take 1,000 mg by mouth daily.    . Homeopathic Products (SINUS BUSTER NA) Place 1 puff into the nose daily as needed.    . hydrochlorothiazide (HYDRODIURIL) 25 MG tablet Take 1 tablet (25 mg total) by mouth daily. 90 tablet 2  . hydrocortisone 2.5 % cream Apply topically 2 (two) times daily. 60 g 1  . meloxicam (MOBIC) 7.5 MG tablet   1  . metFORMIN (GLUCOPHAGE) 500 MG tablet Take 1 tablet (500 mg total) by mouth 2 (two) times daily with a meal. 180 tablet 1  . Multiple Vitamin (MULTIVITAMIN) tablet Take 1 tablet by mouth daily.     Marland Kitchen omeprazole (PRILOSEC) 40 MG capsule Take 1 capsule (40 mg total) by mouth daily. 90 capsule 3  . sildenafil (REVATIO) 20 MG tablet Take 3-4 tablets (60-80 mg total) by mouth at bedtime as needed. 30 tablet 3   No current facility-administered medications for this visit.     Allergies as of 03/11/2018 - Review Complete 03/11/2018  Allergen Reaction Noted  . Sulfa antibiotics Nausea And Vomiting 02/23/2012    Family History  Problem Relation Age of Onset  .  Diabetes Mother   . Coronary artery disease Mother   . Stroke Father        M and F  . Hypertension Father   . Heart disease Sister        ?  Marland Kitchen Alcohol abuse Sister   . Colon cancer Sister 46       age ~ 76  . Prostate cancer Neg Hx   . Stomach cancer Neg Hx   . Rectal cancer Neg Hx   . Esophageal cancer Neg Hx     Social History   Socioeconomic History  . Marital status: Married    Spouse name: Not on file  . Number of children: 3   . Years of education: Not on file  . Highest education level: Not on file  Occupational History  . 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 Financial resource strain: Not on file  . Food insecurity:    Worry: Not on file    Inability: Not on file  . Transportation needs:    Medical: Not on file    Non-medical: Not on file  Tobacco Use  . Smoking status:  Never Smoker  . Smokeless tobacco: Never Used  Substance and Sexual Activity  . Alcohol use: No    Comment: none for 15 years  . Drug use: No  . Sexual activity: Not Currently  Lifestyle  . Physical activity:    Days per week: Not on file    Minutes per session: Not on file  . Stress: Not on file  Relationships  . Social connections:    Talks on phone: Not on file    Gets together: Not on file    Attends religious service: Not on file    Active member of club or organization: Not on file    Attends meetings of clubs or organizations: Not on file    Relationship status: Not on file  . Intimate partner violence:    Fear of current or ex partner: Not on file    Emotionally abused: Not on file    Physically abused: Not on file    Forced sexual activity: Not on file  Other Topics Concern  . Not on file  Social History Narrative   1ST WIFE DIED FROM LUNG CANCER, 2ND WIFE DIED FROM RENAL CANCER.     Married x 4 , lives w/ wife   3 sisters and 3 brother : lost 2 sisters      Physical Exam: Ht 5\' 5"  (1.651 m)   Wt 217 lb (98.4 kg)   BMI 36.11 kg/m  Constitutional: generally well-appearing Psychiatric: alert and oriented x3 Eyes: extraocular movements intact Mouth: oral pharynx moist, no lesions Neck: supple no lymphadenopathy Cardiovascular: heart regular rate and rhythm Lungs: clear to auscultation bilaterally Abdomen: soft, nontender, nondistended, no obvious ascites, no peritoneal signs, normal bowel sounds Extremities: no lower extremity edema bilaterally Skin: no lesions on visible extremities   Assessment and plan: 72 y.o. male with  dyspepsia  Unclear etiology.  Probably functional.  I recommended an EGD to evaluate for PUD, gastritis, H. Pylori.  NO need other tesing prior to then.   Please see the "Patient Instructions" section for addition details about the plan.   Owens Loffler, MD [facility] 03/11/2018, 8:44 AM  Cc: Colon Branch, MD

## 2018-03-11 NOTE — Patient Instructions (Addendum)
You will be set up for an upper endoscopy (for dyspepsia).  You have been scheduled for an endoscopy. Please follow written instructions given to you at your visit today. If you use inhalers (even only as needed), please bring them with you on the day of your procedure. Your physician has requested that you go to www.startemmi.com and enter the access code given to you at your visit today. This web site gives a general overview about your procedure. However, you should still follow specific instructions given to you by our office regarding your preparation for the procedure.  Thank you for entrusting me with your care and choosing [facility].  Dr Ardis Hughs

## 2018-03-18 ENCOUNTER — Other Ambulatory Visit: Payer: BLUE CROSS/BLUE SHIELD

## 2018-03-19 ENCOUNTER — Other Ambulatory Visit: Payer: Self-pay | Admitting: Internal Medicine

## 2018-03-21 ENCOUNTER — Encounter: Payer: Self-pay | Admitting: Interventional Cardiology

## 2018-03-21 ENCOUNTER — Ambulatory Visit: Payer: BLUE CROSS/BLUE SHIELD | Admitting: Interventional Cardiology

## 2018-03-21 VITALS — BP 160/94 | HR 66 | Ht 65.0 in | Wt 217.8 lb

## 2018-03-21 DIAGNOSIS — E119 Type 2 diabetes mellitus without complications: Secondary | ICD-10-CM

## 2018-03-21 DIAGNOSIS — E782 Mixed hyperlipidemia: Secondary | ICD-10-CM

## 2018-03-21 DIAGNOSIS — R072 Precordial pain: Secondary | ICD-10-CM

## 2018-03-21 NOTE — Progress Notes (Signed)
Cardiology Office Note   Date:  03/21/2018   ID:  LANDYN Jones, DOB 1946/02/22, MRN [id number]  PCP:  Colon Branch, MD    No chief complaint on file.  Chest pain  Wt Readings from Last 3 Encounters:  03/21/18 217 lb 12.8 oz (98.8 kg)  03/11/18 217 lb (98.4 kg)  03/06/18 217 lb 8 oz (98.7 kg)       History of Present Illness: Donald Jones is a 72 y.o. male who is being seen today for the evaluation of chest pain at the request of Colon Branch, MD.  He has had chronic GI issues and has pain in the chest on both sides.  Not occurring specifically with exertion, but did occur once with blowing leaves.    He will be seeing a GI MD.  He has an endoscopy planned.  He had a stress test over 10 years ago. He was diagnosed with DM recently.  Started meds recently.   Brother had a pacer placed recently.    Denies : Chest pain. Dizziness. Leg edema. Nitroglycerin use. Orthopnea. Palpitations. Paroxysmal nocturnal dyspnea. Shortness of breath. Syncope.      Past Medical History:  Diagnosis Date  . Arthritis    hands right  . Diabetes mellitus without complication ([place])   . GERD (gastroesophageal reflux disease)   . Hypertension   . Kidney stones    several episodes.  Last one in 2011.    . Obesity    100-lb weight loss since 2011.   . Polycythemia   . Sleep apnea    on Cpap    Past Surgical History:  Procedure Laterality Date  . CHOLECYSTECTOMY    . HAND SURGERY  2010   R hand , d/t a injury, 3 surgeries      Current Outpatient Medications  Medication Sig Dispense Refill  . acetaminophen (TYLENOL) 325 MG tablet Take 650 mg by mouth every 6 (six) hours as needed for pain.     Marland Kitchen aspirin 81 MG tablet Take 81 mg by mouth daily.    Marland Kitchen atorvastatin (LIPITOR) 40 MG tablet Take 1 tablet (40 mg total) by mouth at bedtime. 90 tablet 1  . azelastine (ASTELIN) 0.1 % nasal spray Place 2 sprays into both nostrils 2 (two) times daily. 30 mL 6  . dicyclomine (BENTYL) 20 MG  tablet TAKE 1 TABLET BY MOUTH 3 TIMES DAILY BEFORE MEALS. 270 tablet 1  . Flaxseed, Linseed, (FLAXSEED OIL) 1000 MG CAPS Take 1,000 mg by mouth daily.    . Homeopathic Products (SINUS BUSTER NA) Place 1 puff into the nose daily as needed.    . hydrochlorothiazide (HYDRODIURIL) 25 MG tablet Take 1 tablet (25 mg total) by mouth daily. 90 tablet 2  . hydrocortisone 2.5 % cream Apply topically 2 (two) times daily. 60 g 1  . meloxicam (MOBIC) 7.5 MG tablet   1  . metFORMIN (GLUCOPHAGE) 500 MG tablet Take 1 tablet (500 mg total) by mouth 2 (two) times daily with a meal. 180 tablet 1  . Multiple Vitamin (MULTIVITAMIN) tablet Take 1 tablet by mouth daily.     Marland Kitchen omeprazole (PRILOSEC) 40 MG capsule Take 1 capsule (40 mg total) by mouth daily. 90 capsule 3  . sildenafil (REVATIO) 20 MG tablet Take 3-4 tablets (60-80 mg total) by mouth at bedtime as needed. 30 tablet 3   No current facility-administered medications for this visit.     Allergies:   Sulfa antibiotics  Social History:  The patient  reports that he has never smoked. He has never used smokeless tobacco. He reports that he does not drink alcohol or use drugs.   Family History:  The patient's family history includes Alcohol abuse in his sister; Colon cancer (age of onset: 70) in his sister; Coronary artery disease in his mother; Diabetes in his mother; Heart disease in his sister; Hypertension in his father; Stroke in his father.    ROS:  Please see the history of present illness.   Otherwise, review of systems are positive for difficulty losing weight.   All other systems are reviewed and negative.    PHYSICAL EXAM: VS:  BP (!) 160/94   Pulse 66   Ht 5\' 5"  (1.651 m)   Wt 217 lb 12.8 oz (98.8 kg)   SpO2 97%   BMI 36.24 kg/m  , BMI Body mass index is 36.24 kg/m. GEN: Well nourished, well developed, in no acute distress  HEENT: normal  Neck: no JVD, carotid bruits, or masses Cardiac: RRR; no murmurs, rubs, or gallops,no edema    Respiratory:  clear to auscultation bilaterally, normal work of breathing GI: soft, nontender, nondistended, + BS, obese MS: no deformity or atrophy  Skin: warm and dry, no rash Neuro:  Strength and sensation are intact Psych: euthymic mood, full affect   EKG:   The ekg ordered today demonstrates NSR, no ST changes   Recent Labs: 01/04/2018: BUN 15; Creat 1.21; Hemoglobin 16.4; Platelets 262; Potassium 4.5; Sodium 137 03/06/2018: ALT 31   Lipid Panel    Component Value Date/Time   CHOL 109 03/06/2018 0917   TRIG 54.0 03/06/2018 0917   HDL 44.90 03/06/2018 0917   CHOLHDL 2 03/06/2018 0917   VLDL 10.8 03/06/2018 0917   LDLCALC 53 03/06/2018 0917     Other studies Reviewed: Additional studies/ records that were reviewed today with results demonstrating: LDL 53 in 2019.   ASSESSMENT AND PLAN:  1. Chest pain: Several RF for CAD.  Will plan for exercise nuclear stress test.  Would like to evaluate exercise capacity.    2. DM: Healthy diet discussed.  His PMD follows.  3. Morbid obesity: Minimize sugar in the diet.   4. Hyperlipidemia: Already on high dose atorvastatin.  5. OSA: uses CPAP.   Current medicines are reviewed at length with the patient today.  The patient concerns regarding his medicines were addressed.  The following changes have been made:  No change  Labs/ tests ordered today include: stress test No orders of the defined types were placed in this encounter.   Recommend 150 minutes/week of aerobic exercise Low fat, low carb, high fiber diet recommended  Disposition:   FU in 1 year, and for stress test   Signed, Larae Grooms, MD  03/21/2018 3:59 PM    [facility] [address], [place], [place]  [address] Phone: [phone]; Fax: [phone]

## 2018-03-21 NOTE — Patient Instructions (Signed)
Medication Instructions:  Your physician recommends that you continue on your current medications as directed. Please refer to the Current Medication list given to you today.  If you need a refill on your cardiac medications before your next appointment, please call your pharmacy.   Lab work: None Ordered  If you have labs (blood work) drawn today and your tests are completely normal, you will receive your results only by: Marland Kitchen MyChart Message (if you have MyChart) OR . A paper copy in the mail If you have any lab test that is abnormal or we need to change your treatment, we will call you to review the results.  Testing/Procedures: Your physician has requested that you have en exercise stress myoview. For further information please visit [email or web address]. Please follow instruction sheet, as given.  Follow-Up: At [facility], you and your health needs are our priority.  As part of our continuing mission to provide you with exceptional heart care, we have created designated Provider Care Teams.  These Care Teams include your primary Cardiologist (physician) and Advanced Practice Providers (APPs -  Physician Assistants and Nurse Practitioners) who all work together to provide you with the care you need, when you need it. . You will need a follow up appointment in 1 year.  Please call our office 2 months in advance to schedule this appointment.  You may see Casandra Doffing, MD or one of the following Advanced Practice Providers on your designated Care Team:   . Lyda Jester, PA-C . Dayna Dunn, PA-C . Ermalinda Barrios, PA-C  Any Other Special Instructions Will Be Listed Below (If Applicable).

## 2018-04-02 ENCOUNTER — Telehealth: Payer: Self-pay | Admitting: *Deleted

## 2018-04-02 NOTE — Telephone Encounter (Signed)
Patient's wife per DPR was given detailed instructions per Myocardial Perfusion Study Information Sheet for the test on 04/08/18 at 0730. Patient notified to arrive 15 minutes early and that it is imperative to arrive on time for appointment to keep from having the test rescheduled.  If you need to cancel or reschedule your appointment, please call the office within 24 hours of your appointment. . Patient verbalized understanding.Kalany Diekmann, Ranae [place]

## 2018-04-03 ENCOUNTER — Other Ambulatory Visit: Payer: Self-pay

## 2018-04-03 ENCOUNTER — Ambulatory Visit: Payer: Self-pay | Admitting: *Deleted

## 2018-04-03 ENCOUNTER — Telehealth: Payer: Self-pay | Admitting: Internal Medicine

## 2018-04-03 ENCOUNTER — Encounter (HOSPITAL_COMMUNITY): Payer: Self-pay | Admitting: Emergency Medicine

## 2018-04-03 ENCOUNTER — Emergency Department (HOSPITAL_COMMUNITY)
Admission: EM | Admit: 2018-04-03 | Discharge: 2018-04-03 | Disposition: A | Discharge status: Home / Self Care | Payer: BLUE CROSS/BLUE SHIELD | Attending: Emergency Medicine | Admitting: Emergency Medicine

## 2018-04-03 DIAGNOSIS — I10 Essential (primary) hypertension: Secondary | ICD-10-CM | POA: Diagnosis not present

## 2018-04-03 DIAGNOSIS — E119 Type 2 diabetes mellitus without complications: Secondary | ICD-10-CM | POA: Diagnosis not present

## 2018-04-03 DIAGNOSIS — R42 Dizziness and giddiness: Secondary | ICD-10-CM | POA: Insufficient documentation

## 2018-04-03 LAB — URINALYSIS, ROUTINE W REFLEX MICROSCOPIC
Bilirubin Urine: NEGATIVE
Glucose, UA: NEGATIVE mg/dL
Hgb urine dipstick: NEGATIVE
Ketones, ur: NEGATIVE mg/dL
Leukocytes, UA: NEGATIVE
Nitrite: NEGATIVE
Protein, ur: NEGATIVE mg/dL
Specific Gravity, Urine: 1.016 (ref 1.005–1.030)
pH: 7 (ref 5.0–8.0)

## 2018-04-03 LAB — COMPREHENSIVE METABOLIC PANEL
ALBUMIN: 3.8 g/dL (ref 3.5–5.0)
ALT: 30 U/L (ref 0–44)
AST: 25 U/L (ref 15–41)
Alkaline Phosphatase: 58 U/L (ref 38–126)
Anion gap: 11 (ref 5–15)
BUN: 19 mg/dL (ref 8–23)
CO2: 24 mmol/L (ref 22–32)
Calcium: 9.8 mg/dL (ref 8.9–10.3)
Chloride: 101 mmol/L (ref 98–111)
Creatinine, Ser: 1.22 mg/dL (ref 0.61–1.24)
GFR calc Af Amer: >60 mL/min (ref >60)
GFR calc non Af Amer: 60 mL/min — ABNORMAL LOW (ref >60)
Glucose, Bld: 186 mg/dL — ABNORMAL HIGH (ref 70–99)
Potassium: 4.3 mmol/L (ref 3.5–5.1)
SODIUM: 136 mmol/L (ref 135–145)
Total Bilirubin: 1.5 mg/dL — ABNORMAL HIGH (ref 0.3–1.2)
Total Protein: 6.8 g/dL (ref 6.5–8.1)

## 2018-04-03 LAB — CBC
HCT: 49.3 % (ref 39.0–52.0)
HEMOGLOBIN: 16.4 g/dL (ref 13.0–17.0)
MCH: 29.5 pg (ref 26.0–34.0)
MCHC: 33.3 g/dL (ref 30.0–36.0)
MCV: 88.7 fL (ref 80.0–100.0)
PLATELETS: 219 10*3/uL (ref 150–400)
RBC: 5.56 MIL/uL (ref 4.22–5.81)
RDW: 13.7 % (ref 11.5–15.5)
WBC: 6.7 10*3/uL (ref 4.0–10.5)
nRBC: 0 % (ref 0.0–0.2)

## 2018-04-03 LAB — I-STAT TROPONIN, ED: Troponin i, poc: 0 ng/mL (ref 0.00–0.08)

## 2018-04-03 MED ORDER — MECLIZINE HCL 25 MG PO TABS: 25.0000 mg | ORAL_TABLET | Freq: Three times a day (TID) | ORAL | 0 refills | Status: DC | PRN

## 2018-04-03 MED ORDER — MECLIZINE HCL 25 MG PO TABS
50.0000 mg | ORAL_TABLET | Freq: Once | ORAL | Status: AC
  Administered 2018-04-03: 50 mg via ORAL
  Filled 2018-04-03: qty 2

## 2018-04-03 MED ORDER — ONDANSETRON 4 MG PO TBDP
4.0000 mg | ORAL_TABLET | Freq: Once | ORAL | Status: AC
  Administered 2018-04-03: 4 mg via ORAL
  Filled 2018-04-03: qty 1

## 2018-04-03 NOTE — Telephone Encounter (Signed)
Patient reports he has had some sinus issues- but he had really bad dizzy spell last night- he is still off balance this morning. While triaging patient about his dizziness I had him check his BP- it is very elevated- 180/120 and 172/103 advised patient that the elevated BP could be causing the symptoms he is having- he has been advised to go to the ED. Patient states he will have his wife take him.  Reason for Disposition . [2] Systolic BP  >= 993 OR Diastolic >= 716 AND [9] cardiac or neurologic symptoms (e.g., chest pain, difficulty breathing, unsteady gait, blurred vision)  Answer Assessment - Initial Assessment Questions 1. DESCRIPTION: "Describe your dizziness."     Off balance 2. LIGHTHEADED: "Do you feel lightheaded?" (e.g., somewhat faint, woozy, weak upon standing)     Lightheaded, weak- last night patient felt nauseous  3. VERTIGO: "Do you feel like either you or the room is spinning or tilting?" (i.e. vertigo)     More spinning a little 4. SEVERITY: "How bad is it?"  "Do you feel like you are going to faint?" "Can you stand and walk?"   - MILD - walking normally   - MODERATE - interferes with normal activities (e.g., work, school)    - SEVERE - unable to stand, requires support to walk, feels like passing out now.      moderate 5. ONSET:  "When did the dizziness begin?"     Last night 6. AGGRAVATING FACTORS: "Does anything make it worse?" (e.g., standing, change in head position)     Bending down- changing position 7. HEART RATE: "Can you tell me your heart rate?" "How many beats in 15 seconds?"  (Note: not all patients can do this)       BP 180/120 P 62, patient does take medication- he has taken it 8. CAUSE: "What do you think is causing the dizziness?"     Patient thought it was sinus 9. RECURRENT SYMPTOM: "Have you had dizziness before?" If so, ask: "When was the last time?" "What happened that time?"     Yes- while fishing- feeling passed 10. OTHER SYMPTOMS: "Do you have  any other symptoms?" (e.g., fever, chest pain, vomiting, diarrhea, bleeding)       Head feels cloudy and stuffed up, diarrhea yesterday and loose stools this morning 11. PREGNANCY: "Is there any chance you are pregnant?" "When was your last menstrual period?"       n/a  Answer Assessment - Initial Assessment Questions 1. BLOOD PRESSURE: "What is the blood pressure?" "Did you take at least two measurements 5 minutes apart?"     172/103 2. ONSET: "When did you take your blood pressure?"     8:30 3. HOW: "How did you obtain the blood pressure?" (e.g., visiting nurse, automatic home BP monitor)     Automatic machine 4. HISTORY: "Do you have a history of high blood pressure?"     yes 5. MEDICATIONS: "Are you taking any medications for blood pressure?" "Have you missed any doses recently?"     Medications- no missed doses 6. OTHER SYMPTOMS: "Do you have any symptoms?" (e.g., headache, chest pain, blurred vision, difficulty breathing, weakness)     Weakness, lightheaded 7. PREGNANCY: "Is there any chance you are pregnant?" "When was your last menstrual period?"     n/a  Protocols used: HIGH BLOOD PRESSURE-A-AH, DIZZINESS - [facility]

## 2018-04-03 NOTE — Telephone Encounter (Signed)
Please call patient, see how he is doing, schedule a follow-up.  If he is doing well, follow-up can be in 10 to 14 days.

## 2018-04-03 NOTE — Discharge Instructions (Addendum)
You were evaluated in the Emergency Department and after careful evaluation, we did not find any emergent condition requiring admission or further testing in the hospital.  Your symptoms today seem to be due to vertigo.  You can use the medication provided as needed for further episodes of dizziness.  Please follow-up with your primary care provider to discuss your high blood pressure.  Please return to the Emergency Department if you experience any worsening of your condition.  We encourage you to follow up with a primary care provider.  Thank you for allowing [place] to be a part of your care.

## 2018-04-03 NOTE — ED Notes (Signed)
Patient verbalizes understanding of discharge instructions. Opportunity for questioning and answers were provided. 

## 2018-04-03 NOTE — ED Triage Notes (Signed)
Pt reports feeling dizzy since last night. States that he woke this morning feeling dizzy and lightheaded.  Reports hx of HTN and takes HCTZ regularly.

## 2018-04-03 NOTE — Telephone Encounter (Signed)
Noted  

## 2018-04-03 NOTE — Telephone Encounter (Signed)
FYI. Pt instructed to go to ED.

## 2018-04-03 NOTE — ED Provider Notes (Signed)
[facility] Emergency Department Provider Note MRN:  [id number]  Arrival date & time: 04/03/18     Chief Complaint   Dizziness and Hypertension   History of Present Illness   Donald Jones is a 72 y.o. year-old male with a history of hypertension, diabetes presenting to the ED with chief complaint of dizziness.  At about 10 PM last night, while patient was getting in bed, he experienced sudden onset dizziness, described as the room spinning.  Seem to be made worse with movement of head position.  Got up and walked around and it resolved.  Associated with some nausea, no vomiting.  No associated numbness or weakness to the arms or legs, no slurred speech, no confusion.  Patient has been experiencing nasal congestion for 1 week.  No hearing loss, no ear pain.  No fever, no cough, no chest pain, no shortness of breath, no abdominal pain.  Currently not dizzy, endorsing some continued mild nausea, explains that he just "does not feel good".  Review of Systems  A complete 10 system review of systems was obtained and all systems are negative except as noted in the HPI and PMH.   Patient's Health History    Past Medical History:  Diagnosis Date  . Arthritis    hands right  . Diabetes mellitus without complication ([place])   . GERD (gastroesophageal reflux disease)   . Hypertension   . Kidney stones    several episodes.  Last one in 2011.    . Obesity    100-lb weight loss since 2011.   . Polycythemia   . Sleep apnea    on Cpap    Past Surgical History:  Procedure Laterality Date  . CHOLECYSTECTOMY    . HAND SURGERY  2010   R hand , d/t a injury, 3 surgeries     Family History  Problem Relation Age of Onset  . Diabetes Mother   . Coronary artery disease Mother   . Stroke Father        M and F  . Hypertension Father   . Heart disease Sister        ?  Marland Kitchen Alcohol abuse Sister   . Colon cancer Sister 78       age ~ 24  . Prostate cancer Neg Hx   . Stomach  cancer Neg Hx   . Rectal cancer Neg Hx   . Esophageal cancer Neg Hx     Social History   Socioeconomic History  . Marital status: Married    Spouse name: Not on file  . Number of children: 3   . Years of education: Not on file  . Highest education level: Not on file  Occupational History  . 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 Financial resource strain: Not on file  . Food insecurity:    Worry: Not on file    Inability: Not on file  . Transportation needs:    Medical: Not on file    Non-medical: Not on file  Tobacco Use  . Smoking status: Never Smoker  . Smokeless tobacco: Never Used  Substance and Sexual Activity  . Alcohol use: No    Comment: none for 15 years  . Drug use: No  . Sexual activity: Not Currently  Lifestyle  . Physical activity:    Days per week: Not on file    Minutes per session: Not on file  . Stress: Not on file  Relationships  . Social connections:  Talks on phone: Not on file    Gets together: Not on file    Attends religious service: Not on file    Active member of club or organization: Not on file    Attends meetings of clubs or organizations: Not on file    Relationship status: Not on file  . Intimate partner violence:    Fear of current or ex partner: Not on file    Emotionally abused: Not on file    Physically abused: Not on file    Forced sexual activity: Not on file  Other Topics Concern  . Not on file  Social History Narrative   1ST WIFE DIED FROM LUNG CANCER, 2ND WIFE DIED FROM RENAL CANCER.     Married x 4 , lives w/ wife   3 sisters and 3 brother : lost 2 sisters      Physical Exam  Vital Signs and Nursing Notes reviewed Vitals:   04/03/18 1152 04/03/18 1224  BP: (!) 159/76 (!) 157/85  Pulse: (!) 50 (!) 53  Resp: 18 18  Temp:    SpO2: 97% 97%    CONSTITUTIONAL: Well-appearing, NAD NEURO:  Alert and oriented x 3, normal and symmetric strength and sensation, normal coordination, no facial droop, no nystagmus,  normal gait EYES:  eyes equal and reactive, normal extraocular movements ENT/NECK:  no LAD, no JVD CARDIO: Regular rate, well-perfused, normal S1 and S2 PULM:  CTAB no wheezing or rhonchi GI/GU:  normal bowel sounds, non-distended, non-tender MSK/SPINE:  No gross deformities, no edema SKIN:  no rash, atraumatic PSYCH:  Appropriate speech and behavior  Diagnostic and Interventional Summary    EKG Interpretation  Date/Time:  Wednesday April 03 2018 09:30:32 EST Ventricular Rate:  62 PR Interval:    QRS Duration: 93 QT Interval:  388 QTC Calculation: 394 R Axis:   77 Text Interpretation:  Sinus rhythm Confirmed by Gerlene Fee [phone]) on 04/03/2018 12:11:19 PM      Labs Reviewed  COMPREHENSIVE METABOLIC PANEL - Abnormal; Notable for the following components:      Result Value   Glucose, Bld 186 (*)    Total Bilirubin 1.5 (*)    GFR calc non Af Amer 60 (*)    All other components within normal limits  CBC  URINALYSIS, ROUTINE W REFLEX MICROSCOPIC  I-STAT TROPONIN, ED    No orders to display    Medications  ondansetron (ZOFRAN-ODT) disintegrating tablet 4 mg (4 mg Oral Given 04/03/18 0949)  meclizine (ANTIVERT) tablet 50 mg (50 mg Oral Given 04/03/18 0949)     Procedures Critical Care  ED Course and Medical Decision Making  I have reviewed the triage vital signs and the nursing notes.  Pertinent labs & imaging results that were available during my care of the patient were reviewed by me and considered in my medical decision making (see below for details).  Favoring peripheral vertigo in this 72 year old male, normal neurological exam, vital signs stable, hypertensive here, mild nausea here.  EKG un-concerning, will evaluate basic labs and reassess.  Patient feeling better after Zofran and meclizine.  Labs reassuring.  Patient will follow-up with PCP to discuss his high blood pressure.  After the discussed management above, the patient was determined to be safe  for discharge.  The patient was in agreement with this plan and all questions regarding their care were answered.  ED return precautions were discussed and the patient will return to the ED with any significant worsening of condition.  Barth Kirks.  Sedonia Small, [place] [email or web address]  Final Clinical Impressions(s) / ED Diagnoses     ICD-10-CM   1. Vertigo R42     ED Discharge Orders         Ordered    meclizine (ANTIVERT) 25 MG tablet  3 times daily PRN     04/03/18 1230             Maudie Flakes, MD 04/03/18 1233

## 2018-04-04 NOTE — Telephone Encounter (Signed)
Noted thank you

## 2018-04-04 NOTE — Telephone Encounter (Signed)
Tried calling Pt- no answer, unable to leave message. Pt has appt scheduled 05/01/2017.

## 2018-04-05 ENCOUNTER — Telehealth: Payer: Self-pay | Admitting: Internal Medicine

## 2018-04-05 NOTE — Telephone Encounter (Signed)
Copied from [facility] [id number]. Topic: Quick Sport and exercise psychologist Patient (Clinic Use ONLY) >> Apr 05, 2018  7:49 AM [name] wrote: Reason for CRM: LVM for pt to call the office since pt wanted to schedule an ER fu appt with provider.

## 2018-04-08 ENCOUNTER — Ambulatory Visit (HOSPITAL_COMMUNITY): Payer: BLUE CROSS/BLUE SHIELD | Attending: Cardiovascular Disease

## 2018-04-08 DIAGNOSIS — E119 Type 2 diabetes mellitus without complications: Secondary | ICD-10-CM | POA: Diagnosis not present

## 2018-04-08 DIAGNOSIS — E782 Mixed hyperlipidemia: Secondary | ICD-10-CM | POA: Insufficient documentation

## 2018-04-08 DIAGNOSIS — R072 Precordial pain: Secondary | ICD-10-CM | POA: Insufficient documentation

## 2018-04-08 LAB — MYOCARDIAL PERFUSION IMAGING
Estimated workload: 9.3 METS
Exercise duration (min): 7 min
Exercise duration (sec): 30 s
LV dias vol: 72 mL (ref 62–150)
LVSYSVOL: 29 mL
MPHR: 150 {beats}/min
Peak HR: 130 {beats}/min
Percent HR: 86 %
Rest HR: 64 {beats}/min
SDS: 0
SRS: 0
SSS: 0
TID: 1.01

## 2018-04-08 MED ORDER — TECHNETIUM TC 99M TETROFOSMIN IV KIT
10.3000 | PACK | Freq: Once | INTRAVENOUS | Status: AC | PRN
  Administered 2018-04-08: 10.3 via INTRAVENOUS
  Filled 2018-04-08: qty 11

## 2018-04-08 MED ORDER — TECHNETIUM TC 99M TETROFOSMIN IV KIT
32.2000 | PACK | Freq: Once | INTRAVENOUS | Status: AC | PRN
  Administered 2018-04-08: 32.2 via INTRAVENOUS
  Filled 2018-04-08: qty 33

## 2018-04-23 ENCOUNTER — Encounter: Payer: Self-pay | Admitting: Gastroenterology

## 2018-04-23 ENCOUNTER — Ambulatory Visit (AMBULATORY_SURGERY_CENTER): Payer: BLUE CROSS/BLUE SHIELD | Admitting: Gastroenterology

## 2018-04-23 VITALS — BP 116/73 | HR 60 | Temp 98.0°F | Resp 15 | Ht 65.0 in | Wt 217.0 lb

## 2018-04-23 DIAGNOSIS — K2970 Gastritis, unspecified, without bleeding: Secondary | ICD-10-CM | POA: Diagnosis not present

## 2018-04-23 DIAGNOSIS — K3189 Other diseases of stomach and duodenum: Secondary | ICD-10-CM

## 2018-04-23 DIAGNOSIS — R1013 Epigastric pain: Secondary | ICD-10-CM

## 2018-04-23 DIAGNOSIS — K30 Functional dyspepsia: Secondary | ICD-10-CM | POA: Diagnosis not present

## 2018-04-23 MED ORDER — SODIUM CHLORIDE 0.9 % IV SOLN: 500.0000 mL | Freq: Once | INTRAVENOUS | Status: DC

## 2018-04-23 NOTE — Progress Notes (Signed)
Called to room to assist during endoscopic procedure.  Patient ID and intended procedure confirmed with present staff. Received instructions for my participation in the procedure from the performing physician.  

## 2018-04-23 NOTE — Patient Instructions (Signed)
YOU HAD AN ENDOSCOPIC PROCEDURE TODAY AT THE [facility]:   Refer to the procedure report that was given to you for any specific questions about what was found during the examination.  If the procedure report does not answer your questions, please call your gastroenterologist to clarify.  If you requested that your care partner not be given the details of your procedure findings, then the procedure report has been included in a sealed envelope for you to review at your convenience later.  YOU SHOULD EXPECT: Some feelings of bloating in the abdomen. Passage of more gas than usual.  Walking can help get rid of the air that was put into your GI tract during the procedure and reduce the bloating. If you had a lower endoscopy (such as a colonoscopy or flexible sigmoidoscopy) you may notice spotting of blood in your stool or on the toilet paper. If you underwent a bowel prep for your procedure, you may not have a normal bowel movement for a few days.  Please Note:  You might notice some irritation and congestion in your nose or some drainage.  This is from the oxygen used during your procedure.  There is no need for concern and it should clear up in a day or so.  SYMPTOMS TO REPORT IMMEDIATELY:   Following upper endoscopy (EGD)  Vomiting of blood or coffee ground material  New chest pain or pain under the shoulder blades  Painful or persistently difficult swallowing  New shortness of breath  Fever of 100F or higher  Black, tarry-looking stools  For urgent or emergent issues, a gastroenterologist can be reached at any hour by calling [phone].   DIET:  We do recommend a small meal at first, but then you may proceed to your regular diet.  Drink plenty of fluids but you should avoid alcoholic beverages for 24 hours.  ACTIVITY:  You should plan to take it easy for the rest of today and you should NOT DRIVE or use heavy machinery until tomorrow (because of the sedation medicines used  during the test).    FOLLOW UP: Our staff will call the number listed on your records the next business day following your procedure to check on you and address any questions or concerns that you may have regarding the information given to you following your procedure. If we do not reach you, we will leave a message.  However, if you are feeling well and you are not experiencing any problems, there is no need to return our call.  We will assume that you have returned to your regular daily activities without incident.  If any biopsies were taken you will be contacted by phone or by letter within the next 1-3 weeks.  Please call us at [phone] if you have not heard about the biopsies in 3 weeks.   Await for biopsy results Gastritis (handout given)  SIGNATURES/CONFIDENTIALITY: You and/or your care partner have signed paperwork which will be entered into your electronic medical record.  These signatures attest to the fact that that the information above on your After Visit Summary has been reviewed and is understood.  Full responsibility of the confidentiality of this discharge information lies with you and/or your care-partner. 

## 2018-04-23 NOTE — Progress Notes (Signed)
To PACU, VSS. Report to [email or web address] 

## 2018-04-23 NOTE — Progress Notes (Signed)
Pt's states no medical or surgical changes since previsit or office visit. 

## 2018-04-23 NOTE — Op Note (Signed)
[name] Patient Name: Donald Jones Procedure Date: 04/23/2018 1:48 PM MRN: [id number] Endoscopist: Milus Banister , MD Age: 73 Referring MD:  Date of Birth: 1945-10-30 Gender: Male Account #: [id number] Procedure:                Upper GI endoscopy Indications:              Dyspepsia Medicines:                Monitored Anesthesia Care Procedure:                Pre-Anesthesia Assessment:                           - Prior to the procedure, a History and Physical                            was performed, and patient medications and                            allergies were reviewed. The patient's tolerance of                            previous anesthesia was also reviewed. The risks                            and benefits of the procedure and the sedation                            options and risks were discussed with the patient.                            All questions were answered, and informed consent                            was obtained. Prior Anticoagulants: The patient has                            taken no previous anticoagulant or antiplatelet                            agents. ASA Grade Assessment: II - A patient with                            mild systemic disease. After reviewing the risks                            and benefits, the patient was deemed in                            satisfactory condition to undergo the procedure.                           After obtaining informed consent, the endoscope was  passed under direct vision. Throughout the                            procedure, the patient's blood pressure, pulse, and                            oxygen saturations were monitored continuously. The                            Endoscope was introduced through the mouth, and                            advanced to the second part of duodenum. The upper                            GI endoscopy was accomplished without  difficulty.                            The patient tolerated the procedure well. Scope In: Scope Out: Findings:                 Moderate inflammation characterized by erythema,                            friability and granularity was found in the gastric                            antrum. Biopsies were taken with a cold forceps for                            histology.                           The exam was otherwise without abnormality. Complications:            No immediate complications. Estimated blood loss:                            None. Estimated Blood Loss:     Estimated blood loss: none. Impression:               - Gastritis. Biopsied.                           - The examination was otherwise normal. Recommendation:           - Patient has a contact number available for                            emergencies. The signs and symptoms of potential                            delayed complications were discussed with the                            patient. Return to normal activities tomorrow.  Written discharge instructions were provided to the                            patient.                           - Resume previous diet.                           - Continue present medications.                           - Await pathology results. Milus Banister, MD 04/23/2018 1:57:48 PM This report has been signed electronically.

## 2018-04-24 ENCOUNTER — Telehealth: Payer: Self-pay

## 2018-04-24 NOTE — Telephone Encounter (Signed)
Second post procedure follow up call, no answer 

## 2018-04-24 NOTE — Telephone Encounter (Signed)
Follow call made, voicemail not set up.

## 2018-05-01 ENCOUNTER — Ambulatory Visit (HOSPITAL_BASED_OUTPATIENT_CLINIC_OR_DEPARTMENT_OTHER)
Admission: RE | Admit: 2018-05-01 | Discharge: 2018-05-01 | Disposition: A | Discharge status: Home / Self Care | Payer: BLUE CROSS/BLUE SHIELD | Source: Ambulatory Visit | Attending: Internal Medicine | Admitting: Internal Medicine

## 2018-05-01 ENCOUNTER — Ambulatory Visit: Payer: BLUE CROSS/BLUE SHIELD | Admitting: Internal Medicine

## 2018-05-01 ENCOUNTER — Encounter: Payer: Self-pay | Admitting: Internal Medicine

## 2018-05-01 VITALS — BP 136/76 | HR 71 | Temp 98.1°F | Resp 16 | Ht 65.0 in | Wt 220.2 lb

## 2018-05-01 DIAGNOSIS — M19041 Primary osteoarthritis, right hand: Secondary | ICD-10-CM | POA: Diagnosis not present

## 2018-05-01 DIAGNOSIS — I10 Essential (primary) hypertension: Secondary | ICD-10-CM | POA: Diagnosis not present

## 2018-05-01 DIAGNOSIS — M19042 Primary osteoarthritis, left hand: Secondary | ICD-10-CM | POA: Diagnosis not present

## 2018-05-01 DIAGNOSIS — E119 Type 2 diabetes mellitus without complications: Secondary | ICD-10-CM

## 2018-05-01 MED ORDER — DICLOFENAC SODIUM 1 % TD GEL: 4.0000 g | Freq: Three times a day (TID) | TRANSDERMAL | 3 refills | Status: DC | PRN

## 2018-05-01 NOTE — Progress Notes (Signed)
Pre visit review using our clinic review tool, if applicable. No additional management support is needed unless otherwise documented below in the visit note. 

## 2018-05-01 NOTE — Progress Notes (Signed)
Subjective:    Patient ID: Donald Jones, male    DOB: 02-13-1946, 73 y.o.   MRN: [id number]  DOS:  05/01/2018 Type of visit - description: Routine visit In general feeling well Notes from cardiology, GI, EGD report: Reviewed For a while, is having right hand pain: Pain is mostly in the knuckles but also in the fingers.  Some swelling noted, + stiffness, when asked admits to trigger finger from time to time. He works in [employer] and does a lot of work with his hands. Denies neck pain, paresthesias or hand numbness.  BP Readings from Last 3 Encounters:  05/01/18 136/76  04/23/18 116/73  04/03/18 (!) 157/85     Review of Systems Was seen with diarrhea and dyspepsia: sxs resolved No chest pain No dizziness  Past Medical History:  Diagnosis Date  . Arthritis    hands right  . Diabetes mellitus without complication ([place])   . GERD (gastroesophageal reflux disease)   . Hypertension   . Kidney stones    several episodes.  Last one in 2011.    . Obesity    100-lb weight loss since 2011.   . Polycythemia   . Sleep apnea    on Cpap    Past Surgical History:  Procedure Laterality Date  . CHOLECYSTECTOMY    . HAND SURGERY  2010   R hand , d/t a injury, 3 surgeries     Social History   Socioeconomic History  . Marital status: Married    Spouse name: Not on file  . Number of children: 3   . Years of education: Not on file  . Highest education level: Not on file  Occupational History  . 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 Financial resource strain: Not on file  . Food insecurity:    Worry: Not on file    Inability: Not on file  . Transportation needs:    Medical: Not on file    Non-medical: Not on file  Tobacco Use  . Smoking status: Never Smoker  . Smokeless tobacco: Never Used  Substance and Sexual Activity  . Alcohol use: No    Comment: none for 15 years  . Drug use: No  . Sexual activity: Not Currently  Lifestyle  . Physical activity:    Days  per week: Not on file    Minutes per session: Not on file  . Stress: Not on file  Relationships  . Social connections:    Talks on phone: Not on file    Gets together: Not on file    Attends religious service: Not on file    Active member of club or organization: Not on file    Attends meetings of clubs or organizations: Not on file    Relationship status: Not on file  . Intimate partner violence:    Fear of current or ex partner: Not on file    Emotionally abused: Not on file    Physically abused: Not on file    Forced sexual activity: Not on file  Other Topics Concern  . Not on file  Social History Narrative   1ST WIFE DIED FROM LUNG CANCER, 2ND WIFE DIED FROM RENAL CANCER.     Married x 4 , lives w/ wife   3 sisters and 3 brother : lost 2 sisters       Allergies as of 05/01/2018      Reactions   Sulfa Antibiotics Nausea And Vomiting  Medication List       Accurate as of May 01, 2018 11:59 PM. Always use your most recent med list.        acetaminophen 325 MG tablet Commonly known as:  TYLENOL Take 650 mg by mouth every 6 (six) hours as needed for pain.   aspirin 81 MG tablet Take 81 mg by mouth daily.   atorvastatin 40 MG tablet Commonly known as:  LIPITOR Take 1 tablet (40 mg total) by mouth at bedtime.   azelastine 0.1 % nasal spray Commonly known as:  ASTELIN Place 2 sprays into both nostrils 2 (two) times daily.   diclofenac sodium 1 % Gel Commonly known as:  VOLTAREN Apply 4 g topically 3 (three) times daily as needed.   dicyclomine 20 MG tablet Commonly known as:  BENTYL TAKE 1 TABLET BY MOUTH 3 TIMES DAILY BEFORE MEALS.   Flaxseed Oil 1000 MG Caps Take 1,000 mg by mouth daily.   hydrochlorothiazide 25 MG tablet Commonly known as:  HYDRODIURIL Take 1 tablet (25 mg total) by mouth daily.   hydrocortisone 2.5 % cream Apply topically 2 (two) times daily.   metFORMIN 500 MG tablet Commonly known as:  GLUCOPHAGE Take 1 tablet (500 mg  total) by mouth 2 (two) times daily with a meal.   multivitamin tablet Take 1 tablet by mouth daily.   omeprazole 40 MG capsule Commonly known as:  PRILOSEC Take 1 capsule (40 mg total) by mouth daily.   sildenafil 20 MG tablet Commonly known as:  REVATIO Take 3-4 tablets (60-80 mg total) by mouth at bedtime as needed.           Objective:   Physical Exam BP 136/76 (BP Location: Left Arm, Patient Position: Sitting, Cuff Size: Normal)   Pulse 71   Temp 98.1 F (36.7 C) (Oral)   Resp 16   Ht 5\' 5"  (1.651 m)   Wt 220 lb 4 oz (99.9 kg)   SpO2 95%   BMI 36.65 kg/m  General:   Well developed, NAD, BMI noted. HEENT:  Normocephalic . Face symmetric, atraumatic Lungs:  CTA B Normal respiratory effort, no intercostal retractions, no accessory muscle use. Heart: RRR,  no murmur.  No pretibial edema bilaterally MSK: Left hand: Normal Right hand: Minimal puffiness, no redness, warmness.  Range of motion is normal. Both hands have some changes consistent with DJD. Skin: Not pale. Not jaundice Neurologic:  alert & oriented X3.  Speech normal, gait appropriate for age and unassisted Psych--  Cognition and judgment appear intact.  Cooperative with normal attention span and concentration.  Behavior appropriate. No anxious or depressed appearing.      Assessment      Assessment   DM (pre-DM until 2016) HTN Polycythemia- sees hematology, likely d/t OSA-diuretics, JAK2 (-) ~ 2014, last OV 10-2014, f/u prn OSA on CPAP DJD- saw Dr Rhona Raider before  R Foot drop  GERD IBS? GI sx on and off, previously dx w/ IBS Urolithiasis, several episodes   PLAN: DM: Currently on metformin, check A1c, last CMP satisfactory.  Ambulatory CBGs: This morning 120 HTN: Continue hydrochlorothiazide, last CMP satisfactory. DJD: Right hand pain probably a combination of DJD and overuse.  Recommend topical diclofenac, get x-rays to rule out erosions.  Otherwise observation and consider Ortho  referral. Diarrhea, dyspepsia, saw GI in November, etiology unclear, functional?  EGD was done, showed gastritis, no H. pylori, no malignancy. Doing better Chest pain: Saw cardiology, stress test negative, average exercise tolerance.  No further  chest pain RTC 6 months

## 2018-05-01 NOTE — Patient Instructions (Signed)
GO TO THE LAB : Get the blood work     GO TO THE FRONT DESK Schedule your next appointment for a  Check up in 6 months   STOP BY THE FIRST FLOOR:  get the XR   For  right hand pain: Use the cream I sent to your pharmacy, diclofenac, apply to the hand 3 times a day as needed

## 2018-05-02 ENCOUNTER — Other Ambulatory Visit: Payer: Self-pay | Admitting: Gastroenterology

## 2018-05-02 DIAGNOSIS — R1013 Epigastric pain: Secondary | ICD-10-CM

## 2018-05-02 LAB — HEMOGLOBIN A1C: Hgb A1c MFr Bld: 7.1 % — ABNORMAL HIGH (ref 4.6–6.5)

## 2018-05-02 NOTE — Assessment & Plan Note (Signed)
DM: Currently on metformin, check A1c, last CMP satisfactory.  Ambulatory CBGs: This morning 120 HTN: Continue hydrochlorothiazide, last CMP satisfactory. DJD: Right hand pain probably a combination of DJD and overuse.  Recommend topical diclofenac, get x-rays to rule out erosions.  Otherwise observation and consider Ortho referral. Diarrhea, dyspepsia, saw GI in November, etiology unclear, functional?  EGD was done, showed gastritis, no H. pylori, no malignancy. Doing better Chest pain: Saw cardiology, stress test negative, average exercise tolerance.  No further chest pain RTC 6 months

## 2018-05-06 ENCOUNTER — Telehealth: Payer: Self-pay | Admitting: Internal Medicine

## 2018-05-06 NOTE — Telephone Encounter (Signed)
Copied from [place] [id number]. Topic: Quick Communication - Lab Results (Clinic Use ONLY) >> May 06, 2018  9:04 AM Scherrie Gerlach wrote: Pt calling back for lab results.  No CRM created, but it is under results in the chart/ ok for [facility] to discuss.

## 2018-05-06 NOTE — Telephone Encounter (Signed)
Patient notified- see lab results

## 2018-05-14 ENCOUNTER — Encounter (HOSPITAL_COMMUNITY): Payer: BLUE CROSS/BLUE SHIELD

## 2018-05-17 ENCOUNTER — Encounter (HOSPITAL_COMMUNITY): Payer: BLUE CROSS/BLUE SHIELD

## 2018-05-22 ENCOUNTER — Telehealth: Payer: Self-pay

## 2018-05-22 ENCOUNTER — Other Ambulatory Visit: Payer: Self-pay | Admitting: Internal Medicine

## 2018-05-22 MED ORDER — METFORMIN HCL 1000 MG PO TABS: 1000.0000 mg | ORAL_TABLET | Freq: Two times a day (BID) | ORAL | 1 refills | Status: DC

## 2018-05-22 NOTE — Telephone Encounter (Signed)
Copied from [place] [id number]. Topic: General - Other >> May 22, 2018 10:39 AM Yvette Rack wrote: Reason for CRM: pt wife Diane calling stating that Dr [name] wanted her husband to update him on how pt is Doing on his medicine he is well on the metFORMIN (GLUCOPHAGE) 500 MG tablet and would need to increase this medicine      [address] - [place], [place] - [place] [phone] (Phone) [phone] (Fax)

## 2018-05-22 NOTE — Telephone Encounter (Signed)
Okay, according to plan, he is currently taking 500 mg 2 tablets BID. Advised patient's wife will change to: Metformin 1000 mg ONE tablet twice a day. Send a new prescription

## 2018-05-22 NOTE — Telephone Encounter (Signed)
Spoke w/ Diane, informed of PCP recommendations. She verbalized understanding. Metformin 1000mg  1 bid sent to [facility].

## 2018-07-15 ENCOUNTER — Ambulatory Visit: Payer: BLUE CROSS/BLUE SHIELD | Admitting: Internal Medicine

## 2018-07-23 ENCOUNTER — Other Ambulatory Visit: Payer: Self-pay | Admitting: Internal Medicine

## 2018-08-19 ENCOUNTER — Other Ambulatory Visit: Payer: Self-pay | Admitting: Internal Medicine

## 2018-09-04 ENCOUNTER — Other Ambulatory Visit: Payer: Self-pay | Admitting: Internal Medicine

## 2018-10-02 ENCOUNTER — Telehealth: Payer: Self-pay | Admitting: General Practice

## 2018-10-02 ENCOUNTER — Ambulatory Visit: Payer: BLUE CROSS/BLUE SHIELD | Admitting: Internal Medicine

## 2018-10-02 ENCOUNTER — Ambulatory Visit (INDEPENDENT_AMBULATORY_CARE_PROVIDER_SITE_OTHER): Payer: BC Managed Care – PPO | Admitting: Internal Medicine

## 2018-10-02 DIAGNOSIS — F419 Anxiety disorder, unspecified: Secondary | ICD-10-CM | POA: Diagnosis not present

## 2018-10-02 DIAGNOSIS — R197 Diarrhea, unspecified: Secondary | ICD-10-CM | POA: Diagnosis not present

## 2018-10-02 DIAGNOSIS — I10 Essential (primary) hypertension: Secondary | ICD-10-CM | POA: Diagnosis not present

## 2018-10-02 DIAGNOSIS — Z20822 Contact with and (suspected) exposure to covid-19: Secondary | ICD-10-CM

## 2018-10-02 MED ORDER — AMLODIPINE BESYLATE 5 MG PO TABS: 5.0000 mg | ORAL_TABLET | Freq: Every day | ORAL | 6 refills | Status: DC

## 2018-10-02 NOTE — Addendum Note (Signed)
Addended by: Dimple Nanas on: 10/02/2018 05:21 PM   Modules accepted: Orders

## 2018-10-02 NOTE — Progress Notes (Signed)
Subjective:    Patient ID: Donald Jones, male    DOB: [birth year], 72 y.o.   MRN: [id number]  DOS:  10/02/2018 Type of visit - description: Virtual Visit via Video Note  I connected with@ on 10/03/18 at  3:40 PM EDT by a video enabled telemedicine application and verified that I am speaking with the correct person using two identifiers.   THIS ENCOUNTER IS A VIRTUAL VISIT DUE TO COVID-19 - PATIENT WAS NOT SEEN IN THE OFFICE. PATIENT HAS CONSENTED TO VIRTUAL VISIT / TELEMEDICINE VISIT   Location of patient: home  Location of provider: office  I discussed the limitations of evaluation and management by telemedicine and the availability of in person appointments. The patient expressed understanding and agreed to proceed.  History of Present Illness: Acute visit Symptoms started 4 days ago: Mild diffuse abdominal discomfort described as burning. Diarrhea, initially mild, it peaked yesterday with several episodes with "almost watery" stools. Due to the symptoms, he has been very anxious, afraid of COVID-19.  Admits that uses a mask most of the time when he goes shopping. He did go to church with a number of people this weekend. Reports subjective fever but has check his temperature, T-max was 97.2.  HTN: Good med compliance, ambulatory BPs have been elevated in the last few weeks as high as 176/88 but typically in the 150s, 160s.    Review of Systems  Denies vomiting but had some nausea No blood in the stools No chest pain no difficulty breathing No runny nose or sore throat.  No cough No known contacts with sick people.  Past Medical History:  Diagnosis Date  . Arthritis    hands right  . Diabetes mellitus without complication ([place])   . GERD (gastroesophageal reflux disease)   . Hypertension   . Kidney stones    several episodes.  Last one in 2011.    . Obesity    100-lb weight loss since 2011.   . Polycythemia   . Sleep apnea    on Cpap    Past Surgical History:   Procedure Laterality Date  . CHOLECYSTECTOMY    . HAND SURGERY  2010   R hand , d/t a injury, 3 surgeries     Social History   Socioeconomic History  . Marital status: Married    Spouse name: Not on file  . Number of children: 3   . Years of education: Not on file  . Highest education level: Not on file  Occupational History  . 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 Financial resource strain: Not on file  . Food insecurity    Worry: Not on file    Inability: Not on file  . Transportation needs    Medical: Not on file    Non-medical: Not on file  Tobacco Use  . Smoking status: Never Smoker  . Smokeless tobacco: Never Used  Substance and Sexual Activity  . Alcohol use: No    Comment: none for 15 years  . Drug use: No  . Sexual activity: Not Currently  Lifestyle  . Physical activity    Days per week: Not on file    Minutes per session: Not on file  . Stress: Not on file  Relationships  . Social Herbalist on phone: Not on file    Gets together: Not on file    Attends religious service: Not on file    Active member of club or organization: Not on  file    Attends meetings of clubs or organizations: Not on file    Relationship status: Not on file  . Intimate partner violence    Fear of current or ex partner: Not on file    Emotionally abused: Not on file    Physically abused: Not on file    Forced sexual activity: Not on file  Other Topics Concern  . Not on file  Social History Narrative   1ST WIFE DIED FROM LUNG CANCER, 2ND WIFE DIED FROM RENAL CANCER.     Married x 4 , lives w/ wife   3 sisters and 3 brother : lost 2 sisters       Allergies as of 10/02/2018      Reactions   Sulfa Antibiotics Nausea And Vomiting      Medication List       Accurate as of October 02, 2018 11:59 PM. If you have any questions, ask your nurse or doctor.        acetaminophen 325 MG tablet Commonly known as: TYLENOL Take 650 mg by mouth every 6 (six) hours as  needed for pain.   amLODipine 5 MG tablet Commonly known as: NORVASC Take 1 tablet (5 mg total) by mouth daily. Started by: Kathlene November, MD   aspirin 81 MG tablet Take 81 mg by mouth daily.   atorvastatin 40 MG tablet Commonly known as: LIPITOR Take 1 tablet (40 mg total) by mouth at bedtime.   Azelastine HCl 137 MCG/SPRAY Soln Place 2 sprays into the nose 2 (two) times daily.   diclofenac sodium 1 % Gel Commonly known as: VOLTAREN Apply 4 g topically 3 (three) times daily as needed.   dicyclomine 20 MG tablet Commonly known as: BENTYL Take 1 tablet (20 mg total) by mouth 3 (three) times daily before meals.   Flaxseed Oil 1000 MG Caps Take 1,000 mg by mouth daily.   hydrochlorothiazide 25 MG tablet Commonly known as: HYDRODIURIL Take 1 tablet (25 mg total) by mouth daily.   hydrocortisone 2.5 % cream Apply topically 2 (two) times daily.   metFORMIN 1000 MG tablet Commonly known as: GLUCOPHAGE Take 1 tablet (1,000 mg total) by mouth 2 (two) times daily with a meal.   multivitamin tablet Take 1 tablet by mouth daily.   omeprazole 40 MG capsule Commonly known as: PRILOSEC Take 1 capsule (40 mg total) by mouth daily.   sildenafil 20 MG tablet Commonly known as: REVATIO Take 3-4 tablets (60-80 mg total) by mouth at bedtime as needed. What changed: reasons to take this           Objective:   Physical Exam There were no vitals taken for this visit. This is a virtual video visit.  Patient is alert oriented x3, no apparent distress.  Speech is fluent.    Assessment    Assessment   DM (pre-DM until 2016) HTN Polycythemia- sees hematology, likely d/t OSA-diuretics, JAK2 (-) ~ 2014, last OV 10-2014, f/u prn OSA on CPAP DJD- saw Dr Rhona Raider before  R Foot drop  GERD IBS? GI sx on and off, previously dx w/ IBS Urolithiasis, several episodes   PLAN: Acute diarrhea: As described above, will treat with increase non-sugary fluids such as diet sodas, Gatorade,  soup.  Also Pepto-Bismol.  Definitely call if symptoms increase specifically if severe pain, fever chills or blood in the stools. Coronavirus: In the midst of a pandemia, diarrhea could be covid-related.  Will send a request for testing HTN: Good compliance  with HCTZ, BPs at home are elevated lately.  Recommend to add amlodipine 5 mg, Rx sent. Anxiety: The patient is getting somewhat anxious about having diarrhea in the midst of a pandemia.  Counseled.  Encourage to be very safe, use his mask consistently.  Also, my recommendation is to avoid crowds and specifically avoid church services until things improve. Work note: Recommend to stay home at least till 10/06/2018. Will mail a letter  Follow-up: Has an appointment scheduled 10/30/2018

## 2018-10-02 NOTE — Telephone Encounter (Signed)
-----   Message from Colon Branch, MD sent at 10/02/2018  4:20 PM EDT ----- Regarding: Please arrange a COVID-19 testing.  Symptom: Diarrhea

## 2018-10-02 NOTE — Telephone Encounter (Signed)
Pt has been scheduled for covid testing.  Scheduled with pt directly. Pt was referred by: Kathlene November MD

## 2018-10-03 ENCOUNTER — Other Ambulatory Visit: Payer: Self-pay

## 2018-10-03 DIAGNOSIS — Z20822 Contact with and (suspected) exposure to covid-19: Secondary | ICD-10-CM

## 2018-10-03 DIAGNOSIS — R6889 Other general symptoms and signs: Secondary | ICD-10-CM | POA: Diagnosis not present

## 2018-10-03 NOTE — Assessment & Plan Note (Signed)
Acute diarrhea: As described above, will treat with increase non-sugary fluids such as diet sodas, Gatorade, soup.  Also Pepto-Bismol.  Definitely call if symptoms increase specifically if severe pain, fever chills or blood in the stools. Coronavirus: In the midst of a pandemia, diarrhea could be covid-related.  Will send a request for testing HTN: Good compliance with HCTZ, BPs at home are elevated lately.  Recommend to add amlodipine 5 mg, Rx sent. Anxiety: The patient is getting somewhat anxious about having diarrhea in the midst of a pandemia.  Counseled.  Encourage to be very safe, use his mask consistently.  Also, my recommendation is to avoid crowds and specifically avoid church services until things improve. Work note: Recommend to stay home at least till 10/06/2018. Will mail a letter  Follow-up: Has an appointment scheduled 10/30/2018

## 2018-10-07 LAB — NOVEL CORONAVIRUS, NAA: SARS-CoV-2, NAA: NOT DETECTED

## 2018-10-09 ENCOUNTER — Telehealth: Payer: Self-pay

## 2018-10-09 NOTE — Telephone Encounter (Signed)
Copied from [place] [id number]. Topic: Appointment Scheduling - Scheduling Inquiry for Clinic >> Oct 08, 2018  4:45 PM Donald Jones wrote: Reason for CRM:   Pt states that he is having stomach problems, gas pains.  States that he has been working in the heat, but it gets worse during the day.  Pt wants to know if he can be seen.

## 2018-10-09 NOTE — Telephone Encounter (Signed)
Spoke w/ Pt- not having fever, cough, myalgias- appt scheduled at 2:40pm tomorrow. Pt knows not to come if any other symptoms surface.

## 2018-10-09 NOTE — Telephone Encounter (Signed)
Okay for a in person visit as long as he only have GI problems.  Unable to see him if he has fever, cough, myalgias (will have to be a virtual visit).

## 2018-10-09 NOTE — Telephone Encounter (Signed)
Please advise- Pt's COVID test was negative on 10/05/2018.

## 2018-10-10 ENCOUNTER — Other Ambulatory Visit: Payer: Self-pay

## 2018-10-10 ENCOUNTER — Ambulatory Visit: Payer: BC Managed Care – PPO | Admitting: Internal Medicine

## 2018-10-10 ENCOUNTER — Encounter: Payer: Self-pay | Admitting: Internal Medicine

## 2018-10-10 VITALS — BP 148/93 | HR 67 | Temp 98.0°F | Resp 16 | Ht 65.0 in | Wt 203.0 lb

## 2018-10-10 DIAGNOSIS — K591 Functional diarrhea: Secondary | ICD-10-CM

## 2018-10-10 DIAGNOSIS — I10 Essential (primary) hypertension: Secondary | ICD-10-CM

## 2018-10-10 DIAGNOSIS — E119 Type 2 diabetes mellitus without complications: Secondary | ICD-10-CM

## 2018-10-10 MED ORDER — SITAGLIPTIN PHOSPHATE 100 MG PO TABS: 100.0000 mg | ORAL_TABLET | Freq: Every day | ORAL | 5 refills | Status: DC

## 2018-10-10 MED ORDER — METFORMIN HCL 500 MG PO TABS: 500.0000 mg | ORAL_TABLET | Freq: Two times a day (BID) | ORAL | 5 refills | Status: DC

## 2018-10-10 NOTE — Progress Notes (Signed)
Pre visit review using our clinic review tool, if applicable. No additional management support is needed unless otherwise documented below in the visit note. 

## 2018-10-10 NOTE — Patient Instructions (Addendum)
Get the blood work     [place] Schedule your next appointment  For a follow up in 3 months  Decrease metformin to 500 mg twice a day Okay to take metformin 1000 mg: Half tablet twice a day See prescription  Start Januvia, see prescription, this is another medication to help your blood sugar   Start over-the-counter probiotic such as ALIGN   Check the  blood pressure 2 or 3 times a week Be sure your blood pressure is between 110/65 and  135/85. If it is consistently higher or lower, let me know   HOW TO TAKE YOUR BLOOD PRESSURE:   Rest 5 minutes before taking your blood pressure.   Don't smoke or drink caffeinated beverages for at least 30 minutes before.   Take your blood pressure before (not after) you eat.   Sit comfortably with your back supported and both feet on the floor (don't cross your legs).   Elevate your arm to heart level on a table or a desk.   Use the proper sized cuff. It should fit smoothly and snugly around your bare upper arm. There should be enough room to slip a fingertip under the cuff. The bottom edge of the cuff should be 1 inch above the crease of the elbow.   Ideally, take 3 measurements at one sitting and record the average.

## 2018-10-10 NOTE — Progress Notes (Signed)
Subjective:    Patient ID: Donald Jones, male    DOB: [birth year], 73 y.o.   MRN: [id number]  DOS:  10/10/2018 Type of visit - description: Follow-up Diarrhea: See last visit, since the last time he was here he feels overall better without any intervention. Stools are no longer loose He continue complaining of "gas accumulation" at different parts of the abdomen, discomfort is better after he passed gas.  Diabetes: Metformin was increased on 04-2018, on looking back, the patient who has IBS thinks GI symptoms increased since he went up on the metformin dose.  HTN: Good med compliance, ambulatory BP range from 1 30-1 40.  Occasionally 150.   BP Readings from Last 3 Encounters:  10/10/18 (!) 148/93  05/01/18 136/76  04/23/18 116/73     Review of Systems No recent antibiotic intake No fever chills No nausea, vomiting.  No blood in the stools No heartburn No cough No dysuria or gross hematuria  Past Medical History:  Diagnosis Date  . Arthritis    hands right  . Diabetes mellitus without complication ([place])   . GERD (gastroesophageal reflux disease)   . Hypertension   . Kidney stones    several episodes.  Last one in 2011.    . Obesity    100-lb weight loss since 2011.   . Polycythemia   . Sleep apnea    on Cpap    Past Surgical History:  Procedure Laterality Date  . CHOLECYSTECTOMY    . HAND SURGERY  2010   R hand , d/t a injury, 3 surgeries     Social History   Socioeconomic History  . Marital status: Married    Spouse name: Not on file  . Number of children: 3   . Years of education: Not on file  . Highest education level: Not on file  Occupational History  . 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 Financial resource strain: Not on file  . Food insecurity    Worry: Not on file    Inability: Not on file  . Transportation needs    Medical: Not on file    Non-medical: Not on file  Tobacco Use  . Smoking status: Never Smoker  . Smokeless tobacco:  Never Used  Substance and Sexual Activity  . Alcohol use: No    Comment: none for 15 years  . Drug use: No  . Sexual activity: Not Currently  Lifestyle  . Physical activity    Days per week: Not on file    Minutes per session: Not on file  . Stress: Not on file  Relationships  . Social Herbalist on phone: Not on file    Gets together: Not on file    Attends religious service: Not on file    Active member of club or organization: Not on file    Attends meetings of clubs or organizations: Not on file    Relationship status: Not on file  . Intimate partner violence    Fear of current or ex partner: Not on file    Emotionally abused: Not on file    Physically abused: Not on file    Forced sexual activity: Not on file  Other Topics Concern  . Not on file  Social History Narrative   1ST WIFE DIED FROM LUNG CANCER, 2ND WIFE DIED FROM RENAL CANCER.     Married x 4 , lives w/ wife   3 sisters and 3 brother : lost  2 sisters       Allergies as of 10/10/2018      Reactions   Sulfa Antibiotics Nausea And Vomiting      Medication List       Accurate as of October 10, 2018  2:53 PM. If you have any questions, ask your nurse or doctor.        acetaminophen 325 MG tablet Commonly known as: TYLENOL Take 650 mg by mouth every 6 (six) hours as needed for pain.   amLODipine 5 MG tablet Commonly known as: NORVASC Take 1 tablet (5 mg total) by mouth daily.   aspirin 81 MG tablet Take 81 mg by mouth daily.   atorvastatin 40 MG tablet Commonly known as: LIPITOR Take 1 tablet (40 mg total) by mouth at bedtime.   Azelastine HCl 137 MCG/SPRAY Soln Place 2 sprays into the nose 2 (two) times daily.   diclofenac sodium 1 % Gel Commonly known as: VOLTAREN Apply 4 g topically 3 (three) times daily as needed.   dicyclomine 20 MG tablet Commonly known as: BENTYL Take 1 tablet (20 mg total) by mouth 3 (three) times daily before meals.   Flaxseed Oil 1000 MG Caps Take 1,000  mg by mouth daily.   hydrochlorothiazide 25 MG tablet Commonly known as: HYDRODIURIL Take 1 tablet (25 mg total) by mouth daily.   hydrocortisone 2.5 % cream Apply topically 2 (two) times daily.   metFORMIN 1000 MG tablet Commonly known as: GLUCOPHAGE Take 1 tablet (1,000 mg total) by mouth 2 (two) times daily with a meal.   multivitamin tablet Take 1 tablet by mouth daily.   omeprazole 40 MG capsule Commonly known as: PRILOSEC Take 1 capsule (40 mg total) by mouth daily.   sildenafil 20 MG tablet Commonly known as: REVATIO Take 3-4 tablets (60-80 mg total) by mouth at bedtime as needed. What changed: reasons to take this           Objective:   Physical Exam BP (!) 148/93 (BP Location: Left Arm, Patient Position: Sitting, Cuff Size: Small)   Pulse 67   Temp 98 F (36.7 C) (Oral)   Resp 16   Ht 5\' 5"  (1.651 m)   Wt 203 lb (92.1 kg)   SpO2 97%   BMI 33.78 kg/m  General:   Well developed, NAD, BMI noted.  HEENT:  Normocephalic . Face symmetric, atraumatic Lungs:  CTA B Normal respiratory effort, no intercostal retractions, no accessory muscle use. Heart: RRR,  no murmur.  no pretibial edema bilaterally  Abdomen:  Not distended, soft, non-tender. No rebound or rigidity.   Skin: Not pale. Not jaundice Neurologic:  alert & oriented X3.  Speech normal, gait appropriate for age and unassisted Psych--  Cognition and judgment appear intact.  Cooperative with normal attention span and concentration.  Behavior appropriate. No anxious or depressed appearing.     Assessment     Assessment   DM (pre-DM until 2016) HTN Polycythemia- sees hematology, likely d/t OSA-diuretics, JAK2 (-) ~ 2014, last OV 10-2014, f/u prn OSA on CPAP DJD- saw Dr Rhona Raider before  R Foot drop  GI: -GERD - IBS? GI sx on and off, previously dx w/ IBS -Cscopes: 2505,3976, 09/18/2017 - EGD 04/2018, showed gastritis, no H. pylori, no malignancy Urolithiasis, several episodes   PLAN:  DM: Last A1c 7.1 on January 2020, metformin dose increased, apparently he is not tolerating it well. Plan: Decrease metformin to 500 mg twice a day, add Januvia, encouraged a healthy diet .  Check a A1c. Diarrhea: Since the last office visit symptoms are actually better.  No red flags such as blood in the stools.  For now, recommend probiotics OTC.  We are decreasing metformin since that might be part of the problem.  He also has a history of IBS.  Check a TSH for completeness HTN: Slightly elevated today, recommend to continue ambulatory BPs.  Continue amlodipine, check CMP and CBC. RTC 3 months

## 2018-10-11 LAB — HEMOGLOBIN A1C: Hgb A1c MFr Bld: 6.6 % — ABNORMAL HIGH (ref 4.6–6.5)

## 2018-10-11 LAB — CBC WITH DIFFERENTIAL/PLATELET
Basophils Absolute: 0 10*3/uL (ref 0.0–0.1)
Basophils Relative: 0.4 % (ref 0.0–3.0)
Eosinophils Absolute: 0.1 10*3/uL (ref 0.0–0.7)
Eosinophils Relative: 1.6 % (ref 0.0–5.0)
HCT: 51 % (ref 39.0–52.0)
Hemoglobin: 17.1 g/dL — ABNORMAL HIGH (ref 13.0–17.0)
Lymphocytes Relative: 29.8 % (ref 12.0–46.0)
Lymphs Abs: 2.5 10*3/uL (ref 0.7–4.0)
MCHC: 33.6 g/dL (ref 30.0–36.0)
MCV: 91.7 fl (ref 78.0–100.0)
Monocytes Absolute: 0.7 10*3/uL (ref 0.1–1.0)
Monocytes Relative: 8.7 % (ref 3.0–12.0)
Neutro Abs: 5 10*3/uL (ref 1.4–7.7)
Neutrophils Relative %: 59.5 % (ref 43.0–77.0)
Platelets: 215 10*3/uL (ref 150.0–400.0)
RBC: 5.56 Mil/uL (ref 4.22–5.81)
RDW: 13.9 % (ref 11.5–15.5)
WBC: 8.4 10*3/uL (ref 4.0–10.5)

## 2018-10-11 LAB — COMPREHENSIVE METABOLIC PANEL
ALT: 51 U/L (ref 0–53)
AST: 40 U/L — ABNORMAL HIGH (ref 0–37)
Albumin: 4.5 g/dL (ref 3.5–5.2)
Alkaline Phosphatase: 73 U/L (ref 39–117)
BUN: 21 mg/dL (ref 6–23)
CO2: 28 mEq/L (ref 19–32)
Calcium: 10.2 mg/dL (ref 8.4–10.5)
Chloride: 99 mEq/L (ref 96–112)
Creatinine, Ser: 1.28 mg/dL (ref 0.40–1.50)
GFR: 55.42 mL/min — ABNORMAL LOW (ref >60.00)
Glucose, Bld: 105 mg/dL — ABNORMAL HIGH (ref 70–99)
Potassium: 3.9 mEq/L (ref 3.5–5.1)
Sodium: 138 mEq/L (ref 135–145)
Total Bilirubin: 2.3 mg/dL — ABNORMAL HIGH (ref 0.2–1.2)
Total Protein: 6.9 g/dL (ref 6.0–8.3)

## 2018-10-11 LAB — TSH: TSH: 0.12 u[IU]/mL — ABNORMAL LOW (ref 0.35–4.50)

## 2018-10-13 NOTE — Assessment & Plan Note (Signed)
DM: Last A1c 7.1 on January 2020, metformin dose increased, apparently he is not tolerating it well. Plan: Decrease metformin to 500 mg twice a day, add Januvia, encouraged a healthy diet . Check a A1c. Diarrhea: Since the last office visit symptoms are actually better.  No red flags such as blood in the stools.  For now, recommend probiotics OTC.  We are decreasing metformin since that might be part of the problem.  He also has a history of IBS.  Check a TSH for completeness HTN: Slightly elevated today, recommend to continue ambulatory BPs.  Continue amlodipine, check CMP and CBC. RTC 3 months

## 2018-10-30 ENCOUNTER — Ambulatory Visit: Payer: BC Managed Care – PPO | Admitting: Internal Medicine

## 2018-10-30 ENCOUNTER — Encounter: Payer: Self-pay | Admitting: Internal Medicine

## 2018-10-30 ENCOUNTER — Other Ambulatory Visit: Payer: Self-pay

## 2018-10-30 VITALS — BP 157/74 | HR 60 | Temp 97.7°F | Resp 16 | Ht 65.0 in | Wt 204.5 lb

## 2018-10-30 DIAGNOSIS — K589 Irritable bowel syndrome without diarrhea: Secondary | ICD-10-CM

## 2018-10-30 DIAGNOSIS — E119 Type 2 diabetes mellitus without complications: Secondary | ICD-10-CM

## 2018-10-30 DIAGNOSIS — I10 Essential (primary) hypertension: Secondary | ICD-10-CM

## 2018-10-30 NOTE — Progress Notes (Signed)
Pre visit review using our clinic review tool, if applicable. No additional management support is needed unless otherwise documented below in the visit note. 

## 2018-10-30 NOTE — Progress Notes (Signed)
Subjective:    Patient ID: Donald Jones, male    DOB: 1945/05/03, 73 y.o.   MRN: [id number]  DOS:  10/30/2018 Type of visit - description: Follow-up This is a early visit, the patient liked to share the blood pressure readings with me. He seems to be doing very well, ambulatory BPs are in the 120s, 130s only occasionally in the 140s.  Diastolic BP typically in the 80s. Good compliance with Januvia reduced dose of metformin.  He has also improved his diet.  CBGs are better Diarrhea: Essentially resolved, he actually has occasional constipation.   BP Readings from Last 3 Encounters:  10/30/18 (!) 157/74  10/10/18 (!) 148/93  05/01/18 136/76   Wt Readings from Last 3 Encounters:  10/30/18 204 lb 8 oz (92.8 kg)  10/10/18 203 lb (92.1 kg)  05/01/18 220 lb 4 oz (99.9 kg)     Review of Systems Denies fever chills No nausea, vomiting or blood in the stools No cough  Past Medical History:  Diagnosis Date  . Arthritis    hands right  . Diabetes mellitus without complication ([place])   . GERD (gastroesophageal reflux disease)   . Hypertension   . Kidney stones    several episodes.  Last one in 2011.    . Obesity    100-lb weight loss since 2011.   . Polycythemia   . Sleep apnea    on Cpap    Past Surgical History:  Procedure Laterality Date  . CHOLECYSTECTOMY    . HAND SURGERY  2010   R hand , d/t a injury, 3 surgeries     Social History   Socioeconomic History  . Marital status: Married    Spouse name: Not on file  . Number of children: 3   . Years of education: Not on file  . Highest education level: Not on file  Occupational History  . 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 Financial resource strain: Not on file  . Food insecurity    Worry: Not on file    Inability: Not on file  . Transportation needs    Medical: Not on file    Non-medical: Not on file  Tobacco Use  . Smoking status: Never Smoker  . Smokeless tobacco: Never Used  Substance and  Sexual Activity  . Alcohol use: No    Comment: none for 15 years  . Drug use: No  . Sexual activity: Not Currently  Lifestyle  . Physical activity    Days per week: Not on file    Minutes per session: Not on file  . Stress: Not on file  Relationships  . Social Herbalist on phone: Not on file    Gets together: Not on file    Attends religious service: Not on file    Active member of club or organization: Not on file    Attends meetings of clubs or organizations: Not on file    Relationship status: Not on file  . Intimate partner violence    Fear of current or ex partner: Not on file    Emotionally abused: Not on file    Physically abused: Not on file    Forced sexual activity: Not on file  Other Topics Concern  . Not on file  Social History Narrative   1ST WIFE DIED FROM LUNG CANCER, 2ND WIFE DIED FROM RENAL CANCER.     Married x 4 , lives w/ wife   3 sisters and  3 brother : lost 2 sisters       Allergies as of 10/30/2018      Reactions   Sulfa Antibiotics Nausea And Vomiting      Medication List       Accurate as of October 30, 2018  3:39 PM. If you have any questions, ask your nurse or doctor.        acetaminophen 325 MG tablet Commonly known as: TYLENOL Take 650 mg by mouth every 6 (six) hours as needed for pain.   amLODipine 5 MG tablet Commonly known as: NORVASC Take 1 tablet (5 mg total) by mouth daily.   aspirin 81 MG tablet Take 81 mg by mouth daily.   atorvastatin 40 MG tablet Commonly known as: LIPITOR Take 1 tablet (40 mg total) by mouth at bedtime.   Azelastine HCl 137 MCG/SPRAY Soln Place 2 sprays into the nose 2 (two) times daily.   diclofenac sodium 1 % Gel Commonly known as: VOLTAREN Apply 4 g topically 3 (three) times daily as needed.   dicyclomine 20 MG tablet Commonly known as: BENTYL Take 1 tablet (20 mg total) by mouth 3 (three) times daily before meals.   Flaxseed Oil 1000 MG Caps Take 1,000 mg by mouth daily.    hydrochlorothiazide 25 MG tablet Commonly known as: HYDRODIURIL Take 1 tablet (25 mg total) by mouth daily.   hydrocortisone 2.5 % cream Apply topically 2 (two) times daily.   metFORMIN 500 MG tablet Commonly known as: GLUCOPHAGE Take 1 tablet (500 mg total) by mouth 2 (two) times daily with a meal. What changed: when to take this   multivitamin tablet Take 1 tablet by mouth daily.   omeprazole 40 MG capsule Commonly known as: PRILOSEC Take 1 capsule (40 mg total) by mouth daily.   sildenafil 20 MG tablet Commonly known as: REVATIO Take 3-4 tablets (60-80 mg total) by mouth at bedtime as needed. What changed: reasons to take this   sitaGLIPtin 100 MG tablet Commonly known as: Januvia Take 1 tablet (100 mg total) by mouth daily.           Objective:   Physical Exam BP (!) 157/74 (BP Location: Left Arm, Patient Position: Sitting, Cuff Size: Small)   Pulse 60   Temp 97.7 F (36.5 C) (Oral)   Resp 16   Ht 5\' 5"  (1.651 m)   Wt 204 lb 8 oz (92.8 kg)   SpO2 95%   BMI 34.03 kg/m  General:   Well developed, NAD, BMI noted.  HEENT:  Normocephalic . Face symmetric, atraumatic Lungs:  CTA B Normal respiratory effort, no intercostal retractions, no accessory muscle use. Heart: RRR,  no murmur.  no pretibial edema bilaterally  Abdomen:  Not distended, soft, non-tender. No rebound or rigidity.   Skin: Not pale. Not jaundice Neurologic:  alert & oriented X3.  Speech normal, gait appropriate for age and unassisted Psych--  Cognition and judgment appear intact.  Cooperative with normal attention span and concentration.  Behavior appropriate. No anxious or depressed appearing.     Assessment     Assessment   DM (pre-DM until 2016) HTN Polycythemia- sees hematology, likely d/t OSA-diuretics, JAK2 (-) ~ 2014, last OV 10-2014, f/u prn OSA on CPAP DJD- saw Dr Rhona Raider before  R Foot drop  GI: -GERD - IBS? GI sx on and off, previously dx w/ IBS -Cscopes:  [id number], 09/18/2017 - EGD 04/2018, showed gastritis, no H. pylori, no malignancy Urolithiasis, several episodes   PLAN: DM: Currently  on metformin 500 mg twice a day and recently started Januvia, he is eating healthier, CBGs 104, 114.  I think she is doing well, continue present care. Diarrhea: See last visit, metformin dose decreased, diarrhea resolved.  He is actually slightly constipated, we agreed he will try Colace as needed. IBS: See above HTN: Ambulatory BPs typically 120-130, rarely higher.  Continue HCTZ, amlodipine. RTC CPX 674 12-2018.

## 2018-10-31 NOTE — Assessment & Plan Note (Signed)
DM: Currently on metformin 500 mg twice a day and recently started Januvia, he is eating healthier, CBGs 104, 114.  I think she is doing well, continue present care. Diarrhea: See last visit, metformin dose decreased, diarrhea resolved.  He is actually slightly constipated, we agreed he will try Colace as needed. IBS: See above HTN: Ambulatory BPs typically 120-130, rarely higher.  Continue HCTZ, amlodipine. RTC CPX 674 12-2018.

## 2018-11-01 DIAGNOSIS — E119 Type 2 diabetes mellitus without complications: Secondary | ICD-10-CM | POA: Diagnosis not present

## 2018-11-01 DIAGNOSIS — H40033 Anatomical narrow angle, bilateral: Secondary | ICD-10-CM | POA: Diagnosis not present

## 2018-11-13 DIAGNOSIS — M25562 Pain in left knee: Secondary | ICD-10-CM | POA: Diagnosis not present

## 2018-12-28 ENCOUNTER — Other Ambulatory Visit: Payer: Self-pay | Admitting: Internal Medicine

## 2019-01-09 ENCOUNTER — Other Ambulatory Visit: Payer: Self-pay

## 2019-01-10 ENCOUNTER — Ambulatory Visit: Payer: BC Managed Care – PPO | Admitting: Internal Medicine

## 2019-01-10 ENCOUNTER — Encounter: Payer: Self-pay | Admitting: Internal Medicine

## 2019-01-10 VITALS — BP 153/67 | HR 60 | Temp 95.5°F | Resp 16 | Ht 65.0 in | Wt 215.0 lb

## 2019-01-10 DIAGNOSIS — Z23 Encounter for immunization: Secondary | ICD-10-CM

## 2019-01-10 DIAGNOSIS — E119 Type 2 diabetes mellitus without complications: Secondary | ICD-10-CM

## 2019-01-10 DIAGNOSIS — I10 Essential (primary) hypertension: Secondary | ICD-10-CM | POA: Diagnosis not present

## 2019-01-10 MED ORDER — AMLODIPINE BESYLATE 10 MG PO TABS: 10.0000 mg | ORAL_TABLET | Freq: Every day | ORAL | 1 refills | Status: DC

## 2019-01-10 NOTE — Patient Instructions (Addendum)
Per our records you are due for an eye exam. Please contact your eye doctor to schedule an appointment. Please have them send copies of your office visit notes to [place]. Our fax number is [phone].  GO TO THE LAB : Get the blood work     GO TO THE FRONT DESK Schedule your next appointment   for a physical exam in 3 months   Your blood pressure is slightly elevated, please change amlodipine to 10 mg daily.  We are sending a new prescription  Check the  blood pressure weekly BP GOAL is between 110/65 and  135/85. If it is consistently higher or lower, let me know

## 2019-01-10 NOTE — Progress Notes (Signed)
Pre visit review using our clinic review tool, if applicable. No additional management support is needed unless otherwise documented below in the visit note. 

## 2019-01-10 NOTE — Progress Notes (Signed)
Subjective:    Patient ID: Donald Jones, male    DOB: 1946-03-04, 73 y.o.   MRN: [id number]  DOS:  01/10/2019 Type of visit - description: rov  In general feeling well DM: Ambulatory CBGs 101, 98 HTN: Ambulatory BPs in the 140s over 83. Request a flu shot  BP Readings from Last 3 Encounters:  01/10/19 (!) 153/67  10/30/18 (!) 157/74  10/10/18 (!) 148/93     Review of Systems No fever chills No chest pain From time to time has paresthesias at the left forearm, described as "part of the skin falling asleep" Denies burning sensation.  No neck pain.  Symptoms are not nocturnal. No lower extremity paresthesias   Past Medical History:  Diagnosis Date  . Arthritis    hands right  . Diabetes mellitus without complication ([place])   . GERD (gastroesophageal reflux disease)   . Hypertension   . Kidney stones    several episodes.  Last one in 2011.    . Obesity    100-lb weight loss since 2011.   . Polycythemia   . Sleep apnea    on Cpap    Past Surgical History:  Procedure Laterality Date  . CHOLECYSTECTOMY    . HAND SURGERY  2010   R hand , d/t a injury, 3 surgeries     Social History   Socioeconomic History  . Marital status: Married    Spouse name: Not on file  . Number of children: 3   . Years of education: Not on file  . Highest education level: Not on file  Occupational History  . 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 Financial resource strain: Not on file  . Food insecurity    Worry: Not on file    Inability: Not on file  . Transportation needs    Medical: Not on file    Non-medical: Not on file  Tobacco Use  . Smoking status: Never Smoker  . Smokeless tobacco: Never Used  Substance and Sexual Activity  . Alcohol use: No    Comment: none for 15 years  . Drug use: No  . Sexual activity: Not Currently  Lifestyle  . Physical activity    Days per week: Not on file    Minutes per session: Not on file  . Stress: Not on file  Relationships  .  Social Herbalist on phone: Not on file    Gets together: Not on file    Attends religious service: Not on file    Active member of club or organization: Not on file    Attends meetings of clubs or organizations: Not on file    Relationship status: Not on file  . Intimate partner violence    Fear of current or ex partner: Not on file    Emotionally abused: Not on file    Physically abused: Not on file    Forced sexual activity: Not on file  Other Topics Concern  . Not on file  Social History Narrative   1ST WIFE DIED FROM LUNG CANCER, 2ND WIFE DIED FROM RENAL CANCER.     Married x 4 , lives w/ wife   3 sisters and 3 brother : lost 2 sisters       Allergies as of 01/10/2019      Reactions   Sulfa Antibiotics Nausea And Vomiting      Medication List       Accurate as of January 10, 2019 11:59  PM. If you have any questions, ask your nurse or doctor.        acetaminophen 325 MG tablet Commonly known as: TYLENOL Take 650 mg by mouth every 6 (six) hours as needed for pain.   amLODipine 10 MG tablet Commonly known as: NORVASC Take 1 tablet (10 mg total) by mouth daily. What changed:   medication strength  how much to take Changed by: Kathlene November, MD   aspirin 81 MG tablet Take 81 mg by mouth daily.   atorvastatin 40 MG tablet Commonly known as: LIPITOR Take 1 tablet (40 mg total) by mouth at bedtime.   Azelastine HCl 137 MCG/SPRAY Soln Place 2 sprays into the nose 2 (two) times daily.   diclofenac sodium 1 % Gel Commonly known as: VOLTAREN Apply 4 g topically 3 (three) times daily as needed.   dicyclomine 20 MG tablet Commonly known as: BENTYL Take 1 tablet (20 mg total) by mouth 3 (three) times daily before meals.   Flaxseed Oil 1000 MG Caps Take 1,000 mg by mouth daily.   hydrochlorothiazide 25 MG tablet Commonly known as: HYDRODIURIL Take 1 tablet (25 mg total) by mouth daily.   hydrocortisone 2.5 % cream Apply topically 2 (two) times  daily.   metFORMIN 500 MG tablet Commonly known as: GLUCOPHAGE Take 1 tablet (500 mg total) by mouth 2 (two) times daily with a meal. What changed: when to take this   multivitamin tablet Take 1 tablet by mouth daily.   omeprazole 40 MG capsule Commonly known as: PRILOSEC Take 1 capsule (40 mg total) by mouth daily.   sildenafil 20 MG tablet Commonly known as: REVATIO TAKE 3 TO 4 TABLETS BY MOUTH AT BEDTIME AS NEEDED.   sitaGLIPtin 100 MG tablet Commonly known as: Januvia Take 1 tablet (100 mg total) by mouth daily.           Objective:   Physical Exam BP (!) 153/67 (BP Location: Left Arm, Patient Position: Sitting, Cuff Size: Normal)   Pulse 60   Temp (!) 95.5 F (35.3 C) (Temporal)   Resp 16   Ht 5\' 5"  (1.651 m)   Wt 215 lb (97.5 kg)   SpO2 95%   BMI 35.78 kg/m  General:   Well developed, NAD, BMI noted. HEENT:  Normocephalic . Face symmetric, atraumatic Skin: Not pale. Not jaundice Neurologic:  alert & oriented X3.  Speech normal, gait appropriate for age and unassisted Motor, DTRs: Symmetric Diabetic foot exam: No edema, pinprick examination normal Psych--  Cognition and judgment appear intact.  Cooperative with normal attention span and concentration.  Behavior appropriate. No anxious or depressed appearing.      Assessment    Assessment   DM (pre-DM until 2016) HTN Polycythemia- sees hematology, likely d/t OSA-diuretics, JAK2 (-) ~ 2014, last OV 10-2014, f/u prn OSA on CPAP DJD- saw Dr Rhona Raider before  R Foot drop  GI: -GERD - IBS? GI sx on and off, previously dx w/ IBS -Cscopes: [id number], 09/18/2017 - EGD 04/2018, showed gastritis, no H. pylori, no malignancy Urolithiasis, several episodes   PLAN: DM: On metformin 500 mg twice a day and Januvia.  Ambulatory CBGs seem okay.  Feet exam: Normal pinprick examination Paresthesias, left arm: Unclear etiology, not c/w neuropathy.  Recommend observation.  Motor and DTRs symmetric HTN: For the  third time at the office BP is a slightly elevated, yesterday at home was 143/83, heart rate 55.   Plan: Continue HCTZ, increase amlodipine from 5 mg to 10 mg.  Monitor BPs, see AVS Preventive care: Flu shot today RTC CPX 3 months

## 2019-01-11 NOTE — Assessment & Plan Note (Signed)
DM: On metformin 500 mg twice a day and Januvia.  Ambulatory CBGs seem okay.  Feet exam: Normal pinprick examination Paresthesias, left arm: Unclear etiology, not c/w neuropathy.  Recommend observation.  Motor and DTRs symmetric HTN: For the third time at the office BP is a slightly elevated, yesterday at home was 143/83, heart rate 55.   Plan: Continue HCTZ, increase amlodipine from 5 mg to 10 mg.  Monitor BPs, see AVS Preventive care: Flu shot today RTC CPX 3 months

## 2019-02-11 ENCOUNTER — Ambulatory Visit
Admission: EM | Admit: 2019-02-11 | Discharge: 2019-02-11 | Disposition: A | Discharge status: Home / Self Care | Payer: BC Managed Care – PPO | Attending: Nurse Practitioner | Admitting: Nurse Practitioner

## 2019-02-11 DIAGNOSIS — R300 Dysuria: Secondary | ICD-10-CM | POA: Insufficient documentation

## 2019-02-11 LAB — POCT URINALYSIS DIP (MANUAL ENTRY)
Bilirubin, UA: NEGATIVE
Blood, UA: NEGATIVE
Glucose, UA: NEGATIVE mg/dL
Ketones, POC UA: NEGATIVE mg/dL
Leukocytes, UA: NEGATIVE
Nitrite, UA: NEGATIVE
Protein Ur, POC: NEGATIVE mg/dL
Spec Grav, UA: 1.02 (ref 1.010–1.025)
Urobilinogen, UA: 0.2 E.U./dL
pH, UA: 7 (ref 5.0–8.0)

## 2019-02-11 MED ORDER — CIPROFLOXACIN HCL 500 MG PO TABS: 500.0000 mg | ORAL_TABLET | Freq: Two times a day (BID) | ORAL | 0 refills | Status: DC

## 2019-02-11 MED ORDER — TAMSULOSIN HCL 0.4 MG PO CAPS: 0.4000 mg | ORAL_CAPSULE | Freq: Every day | ORAL | 0 refills | Status: AC

## 2019-02-11 NOTE — ED Provider Notes (Signed)
[facility]    CSN: [id number] Arrival date & time: 02/11/19  1257      History   Chief Complaint Chief Complaint  Patient presents with   Urinary Tract Infection    HPI Donald Jones is a 73 y.o. male.   Subjective:  Donald Jones is a 73 y.o. male who complains of dysuria, foul smelling urine, frequency, nausea, suprapubic pressure and right lower back pain for 1 day. Patient denies fever, flank pain, hematuria, vomiting, malaise or weakness. Patient does not have a history of recurrent UTI.  Patient does have a history of kidney stones.  The following portions of the patient's history were reviewed and updated as appropriate: allergies, current medications, past family history, past medical history, past social history, past surgical history and problem list.       Past Medical History:  Diagnosis Date   Arthritis    hands right   Diabetes mellitus without complication ([place])    GERD (gastroesophageal reflux disease)    Hypertension    Kidney stones    several episodes.  Last one in 2011.     Obesity    100-lb weight loss since 2011.    Polycythemia    Sleep apnea    on Cpap    Patient Active Problem List   Diagnosis Date Noted   High cholesterol 01/20/2018   DJD (degenerative joint disease) 09/06/2017   Erectile dysfunction 09/06/2017   PCP NOTES >>> 01/23/2015   Abnormal TSH 10/05/2014   Diabetes mellitus without complication ([place]) [id number]   Obesity (BMI 30-39.9) 12/03/2012   GERD (gastroesophageal reflux disease) 09/24/2012   Skin lesion 09/24/2012   IBS ? 04/22/2012   Polycythemia    Annual physical exam 06/05/2011   Foot drop, right 06/05/2011   BACK PAIN 02/15/2009   Obesity 06/14/2006   HYPERTENSION, BENIGN SYSTEMIC 06/14/2006   RHINITIS, ALLERGIC 06/14/2006   NEPHROLITHIASIS 06/14/2006   OSA (obstructive sleep apnea) 06/14/2006    Past Surgical History:  Procedure Laterality Date    CHOLECYSTECTOMY     HAND SURGERY  2010   R hand , d/t a injury, 3 surgeries        Home Medications    Prior to Admission medications   Medication Sig Start Date End Date Taking? Authorizing Provider  acetaminophen (TYLENOL) 325 MG tablet Take 650 mg by mouth every 6 (six) hours as needed for pain.     [provider]  amLODipine (NORVASC) 10 MG tablet Take 1 tablet (10 mg total) by mouth daily. 01/10/19   Colon Branch, MD  aspirin 81 MG tablet Take 81 mg by mouth daily.    [provider]  atorvastatin (LIPITOR) 40 MG tablet Take 1 tablet (40 mg total) by mouth at bedtime. 09/04/18   Colon Branch, MD  Azelastine HCl 137 MCG/SPRAY SOLN Place 2 sprays into the nose 2 (two) times daily. 07/23/18   Colon Branch, MD  ciprofloxacin (CIPRO) 500 MG tablet Take 1 tablet (500 mg total) by mouth 2 (two) times daily. 02/11/19   Enrique Sack, FNP  diclofenac sodium (VOLTAREN) 1 % GEL Apply 4 g topically 3 (three) times daily as needed. 05/01/18   Colon Branch, MD  dicyclomine (BENTYL) 20 MG tablet Take 1 tablet (20 mg total) by mouth 3 (three) times daily before meals. 08/19/18   Colon Branch, MD  Flaxseed, Linseed, (FLAXSEED OIL) 1000 MG CAPS Take 1,000 mg by mouth daily.    [provider]  hydrochlorothiazide (HYDRODIURIL) 25 MG tablet Take 1 tablet (25 mg total) by mouth daily. 05/22/18   Colon Branch, MD  hydrocortisone 2.5 % cream Apply topically 2 (two) times daily. 08/21/16   Colon Branch, MD  metFORMIN (GLUCOPHAGE) 500 MG tablet Take 1 tablet (500 mg total) by mouth 2 (two) times daily with a meal. Patient taking differently: Take 500 mg by mouth daily with breakfast.  10/10/18   Colon Branch, MD  Multiple Vitamin (MULTIVITAMIN) tablet Take 1 tablet by mouth daily.     [provider]  omeprazole (PRILOSEC) 40 MG capsule Take 1 capsule (40 mg total) by mouth daily. 08/19/18   Colon Branch, MD  sildenafil (REVATIO) 20 MG tablet TAKE 3 TO 4 TABLETS BY MOUTH AT BEDTIME AS  NEEDED. 12/30/18   Colon Branch, MD  sitaGLIPtin (JANUVIA) 100 MG tablet Take 1 tablet (100 mg total) by mouth daily. 10/10/18   Colon Branch, MD  tamsulosin (FLOMAX) 0.4 MG CAPS capsule Take 1 capsule (0.4 mg total) by mouth daily for 7 days. 02/11/19 02/18/19  Enrique Sack, FNP    Family History Family History  Problem Relation Age of Onset   Diabetes Mother    Coronary artery disease Mother    Stroke Father        M and F   Hypertension Father    Heart disease Sister        ?   Alcohol abuse Sister    Colon cancer Sister 57       age ~ 57   Prostate cancer Neg Hx    Stomach cancer Neg Hx    Rectal cancer Neg Hx    Esophageal cancer Neg Hx     Social History Social History   Tobacco Use   Smoking status: Never Smoker   Smokeless tobacco: Never Used  Substance Use Topics   Alcohol use: No    Comment: none for 15 years   Drug use: No     Allergies   Sulfa antibiotics   Review of Systems Review of Systems  Constitutional: Negative for fever.  Gastrointestinal: Positive for nausea. Negative for abdominal pain and vomiting.  Genitourinary: Positive for dysuria and frequency. Negative for flank pain and hematuria.  Musculoskeletal: Positive for back pain.  All other systems reviewed and are negative.    Physical Exam Triage Vital Signs ED Triage Vitals [02/11/19 1307]  Enc Vitals Group     BP (!) 173/76     Pulse Rate 70     Resp 20     Temp 98 F (36.7 C)     Temp Source Oral     SpO2 97 %     Weight      Height      Head Circumference      Peak Flow      Pain Score 0     Pain Loc      Pain Edu?      Excl. in [place]?    No data found.  Updated Vital Signs BP (!) 173/76 (BP Location: Left Arm)    Pulse 70    Temp 98 F (36.7 C) (Oral)    Resp 20    SpO2 97%   Visual Acuity Right Eye Distance:   Left Eye Distance:   Bilateral Distance:    Right Eye Near:   Left Eye Near:    Bilateral Near:     Physical Exam Vitals signs  reviewed.  Constitutional:      General: He is not in acute distress.    Appearance: Normal appearance. He is not ill-appearing or toxic-appearing.  HENT:     Head: Normocephalic.  Neck:     Musculoskeletal: Normal range of motion.  Cardiovascular:     Rate and Rhythm: Normal rate and regular rhythm.  Pulmonary:     Effort: Pulmonary effort is normal.     Breath sounds: Normal breath sounds.  Abdominal:     General: Bowel sounds are normal.     Palpations: Abdomen is soft.     Tenderness: There is no right CVA tenderness or left CVA tenderness.  Musculoskeletal: Normal range of motion.  Skin:    General: Skin is warm and dry.  Neurological:     General: No focal deficit present.     Mental Status: He is alert and oriented to person, place, and time.  Psychiatric:        Mood and Affect: Mood normal.      UC Treatments / Results  Labs (all labs ordered are listed, but only abnormal results are displayed) Labs Reviewed  POCT URINALYSIS DIP (MANUAL ENTRY) - Normal  URINE CULTURE    EKG   Radiology No results found.  Procedures Procedures (including critical care time)  Medications Ordered in UC Medications - No data to display  Initial Impression / Assessment and Plan / UC Course  I have reviewed the triage vital signs and the nursing notes.  Pertinent labs & imaging results that were available during my care of the patient were reviewed by me and considered in my medical decision making (see chart for details).    73 yo male with multiple medical problems presents with a 1 day history of  dysuria, foul smelling urine, frequency, nausea, suprapubic pressure and right lower back pain. Urine dipstick negative for all components.  Urine cultures pending.  Patient is afebrile.  Nontoxic-appearing.  Physical exam unremarkable.  Will treat with Cipro, Flomax and supportive care.  Strict ED precautions reviewed.    Today's evaluation has revealed no signs of a dangerous  process. Discussed diagnosis with patient and/or guardian. Patient and/or guardian aware of their diagnosis, possible red flag symptoms to watch out for and need for close follow up. Patient and/or guardian understands verbal and written discharge instructions. Patient and/or guardian comfortable with plan and disposition.  Patient and/or guardian has a clear mental status at this time, good insight into illness (after discussion and teaching) and has clear judgment to make decisions regarding their care  This care was provided during an unprecedented National Emergency due to the Novel Coronavirus (COVID-19) pandemic. COVID-19 infections and transmission risks place heavy strains on healthcare resources.  As this pandemic evolves, our facility, providers, and staff strive to respond fluidly, to remain operational, and to provide care relative to available resources and information. Outcomes are unpredictable and treatments are without well-defined guidelines. Further, the impact of COVID-19 on all aspects of urgent care, including the impact to patients seeking care for reasons other than COVID-19, is unavoidable during this national emergency. At this time of the global pandemic, management of patients has significantly changed, even for non-COVID positive patients given high local and regional COVID volumes at this time requiring high healthcare system and resource utilization. The standard of care for management of both COVID suspected and non-COVID suspected patients continues to change rapidly at the local, regional, national, and global levels. This patient was worked up and  treated to the best available but ever changing evidence and resources available at this current time.   Documentation was completed with the aid of voice recognition software. Transcription may contain typographical errors.  Final Clinical Impressions(s) / UC Diagnoses   Final diagnoses:  Dysuria     Discharge Instructions       Take medications as directed. Drink plenty of fluids. Go to the ED immediately if your pain gets worse, you have bright red blood in your urine, vomiting, unable to keep anything down, fevers or any other concerns.   Take care,  [facility]     ED Prescriptions    Medication Sig Dispense Auth. Provider   ciprofloxacin (CIPRO) 500 MG tablet Take 1 tablet (500 mg total) by mouth 2 (two) times daily. 14 tablet Enrique Sack, FNP   tamsulosin (FLOMAX) 0.4 MG CAPS capsule Take 1 capsule (0.4 mg total) by mouth daily for 7 days. 7 capsule Enrique Sack, FNP     PDMP not reviewed this encounter.   Enrique Sack, [place] 02/11/19 1336

## 2019-02-11 NOTE — Discharge Instructions (Signed)
Take medications as directed. Drink plenty of fluids. Go to the ED immediately if your pain gets worse, you have bright red blood in your urine, vomiting, unable to keep anything down, fevers or any other concerns.   Take care,  Aldona Bar

## 2019-02-11 NOTE — ED Triage Notes (Signed)
Pt c/o lower back pain yesterday, today having urinary burning and frequency

## 2019-02-12 ENCOUNTER — Telehealth: Payer: Self-pay | Admitting: Emergency Medicine

## 2019-02-12 LAB — URINE CULTURE
Culture: NO GROWTH
Special Requests: NORMAL

## 2019-02-12 NOTE — Telephone Encounter (Signed)
Left voicemail checking in on patient, and encouraged return call with any continuing questions or concerns.    

## 2019-03-04 ENCOUNTER — Other Ambulatory Visit: Payer: Self-pay

## 2019-03-04 DIAGNOSIS — Z20822 Contact with and (suspected) exposure to covid-19: Secondary | ICD-10-CM

## 2019-03-05 LAB — NOVEL CORONAVIRUS, NAA: SARS-CoV-2, NAA: NOT DETECTED

## 2019-03-18 ENCOUNTER — Other Ambulatory Visit: Payer: Self-pay | Admitting: Internal Medicine

## 2019-03-24 ENCOUNTER — Ambulatory Visit: Payer: BC Managed Care – PPO | Admitting: Pulmonary Disease

## 2019-03-28 ENCOUNTER — Ambulatory Visit: Payer: BC Managed Care – PPO | Admitting: Pulmonary Disease

## 2019-03-28 ENCOUNTER — Other Ambulatory Visit: Payer: Self-pay

## 2019-03-28 ENCOUNTER — Encounter: Payer: Self-pay | Admitting: Pulmonary Disease

## 2019-03-28 DIAGNOSIS — G4733 Obstructive sleep apnea (adult) (pediatric): Secondary | ICD-10-CM | POA: Diagnosis not present

## 2019-03-28 DIAGNOSIS — Z23 Encounter for immunization: Secondary | ICD-10-CM | POA: Diagnosis not present

## 2019-03-28 NOTE — Progress Notes (Signed)
   Subjective:    Patient ID: Donald Jones, male    DOB: [birth year], 73 y.o.   MRN: [id number]  HPI  73 yo for follow-up of obstructive sleep apnea. He was diagnosed in 2003   PMH -polycythemia  On his last visit, we noted a large leak on his mask and decreased his auto settings to 7 to 15 cm.  This is worked well for him.  He denies any problems with mask or pressure.  He still works as a Development worker, international aid .  His sleep time is only about 6 hours. Download was reviewed which shows good control of events on auto settings with average pressure of 13 cm and maximum pressure of 14 cm with large leak and residual AHI of 3.6/hour. Compliance is good but 5.5 hours every night  There is no history suggestive of cataplexy, sleep paralysis or parasomnias'  Hypertension is controlled on one medication and diabetes with 2   Review of Systems neg for any significant sore throat, dysphagia, itching, sneezing, nasal congestion or excess/ purulent secretions, fever, chills, sweats, unintended wt loss, pleuritic or exertional cp, hempoptysis, orthopnea pnd or change in chronic leg swelling. Also denies presyncope, palpitations, heartburn, abdominal pain, nausea, vomiting, diarrhea or change in bowel or urinary habits, dysuria,hematuria, rash, arthralgias, visual complaints, headache, numbness weakness or ataxia.     Objective:   Physical Exam   Gen. Pleasant, obese,elderly,  in no distress ENT - no lesions, no post nasal drip Neck: No JVD, no thyromegaly, no carotid bruits Lungs: no use of accessory muscles, no dullness to percussion, decreased without rales or rhonchi  Cardiovascular: Rhythm regular, heart sounds  normal, no murmurs or gallops, no peripheral edema Musculoskeletal: No deformities, no cyanosis or clubbing , no tremors        Assessment & Plan:

## 2019-03-28 NOTE — Assessment & Plan Note (Signed)
Prescription for auto CPAP 8 to 15 cm [facility] Still has a large leak -so will trial of AirFit F30 full facemask He is compliant and CPAP is certainly helped improve his daytime somnolence and fatigue  Weight loss encouraged, compliance with goal of at least 4-6 hrs every night is the expectation. Advised against medications with sedative side effects Cautioned against driving when sleepy - understanding that sleepiness will vary on a day to day basis

## 2019-03-28 NOTE — Addendum Note (Signed)
Addended by: Nena Polio on: 03/28/2019 04:12 PM   Modules accepted: Orders

## 2019-03-28 NOTE — Patient Instructions (Signed)
Prescription for auto CPAP 8 to 15 cm [facility]. Trial of AirFit F30 full facemask

## 2019-04-07 ENCOUNTER — Other Ambulatory Visit: Payer: Self-pay | Admitting: Internal Medicine

## 2019-04-08 ENCOUNTER — Encounter: Payer: BC Managed Care – PPO | Admitting: Internal Medicine

## 2019-04-09 ENCOUNTER — Telehealth: Payer: Self-pay | Admitting: Pulmonary Disease

## 2019-04-09 ENCOUNTER — Encounter: Payer: BC Managed Care – PPO | Admitting: Internal Medicine

## 2019-04-09 NOTE — Telephone Encounter (Signed)
Checked the referral dated 03/28/19 and it has confirmation the order was received by [facility]. Will call in the morning to find out status of order. The rep that spoke to this patient may be on the "[employer]" side of the computer system and not [facility] and not able to see what we can.   Left in triage for follow up on 04/09/19.

## 2019-04-10 NOTE — Telephone Encounter (Signed)
PCCs, can you look into this for [place] please.

## 2019-04-10 NOTE — Telephone Encounter (Signed)
I have sent a piority message to [facility] to see what the status of this order is Donald Jones

## 2019-04-14 NOTE — Telephone Encounter (Signed)
Donald Jones, Harmon Dun, [place]; Jeffie Pollock, the order was for PAP supplies. This was sent on to our Resupply team. I will send them another message to contact the patient.       Previous Messages   ----- Message -----  From: Joellen [place]  Sent: 04/10/2019 11:17 AM EST  To: Darlina Guys, [place] Alas   Pt's wife calling today said they have not heard anything about his cpap the order was confirmed 03/28/19 dob 03/05/2048

## 2019-04-14 NOTE — Telephone Encounter (Signed)
lmam informing the patient that they have his order and to call us if anything else is needed

## 2019-04-17 ENCOUNTER — Encounter: Payer: Self-pay | Admitting: Internal Medicine

## 2019-04-17 ENCOUNTER — Ambulatory Visit (INDEPENDENT_AMBULATORY_CARE_PROVIDER_SITE_OTHER): Payer: BC Managed Care – PPO | Admitting: Internal Medicine

## 2019-04-17 ENCOUNTER — Telehealth: Payer: Self-pay | Admitting: Pulmonary Disease

## 2019-04-17 ENCOUNTER — Other Ambulatory Visit: Payer: Self-pay

## 2019-04-17 VITALS — BP 141/75 | HR 54

## 2019-04-17 DIAGNOSIS — Z0000 Encounter for general adult medical examination without abnormal findings: Secondary | ICD-10-CM | POA: Diagnosis not present

## 2019-04-17 DIAGNOSIS — E119 Type 2 diabetes mellitus without complications: Secondary | ICD-10-CM

## 2019-04-17 DIAGNOSIS — R7989 Other specified abnormal findings of blood chemistry: Secondary | ICD-10-CM | POA: Diagnosis not present

## 2019-04-17 LAB — CBC
HCT: 48.3 % (ref 39.0–52.0)
Hemoglobin: 16.4 g/dL (ref 13.0–17.0)
MCHC: 33.9 g/dL (ref 30.0–36.0)
MCV: 92.6 fl (ref 78.0–100.0)
Platelets: 201 10*3/uL (ref 150.0–400.0)
RBC: 5.22 Mil/uL (ref 4.22–5.81)
RDW: 13.2 % (ref 11.5–15.5)
WBC: 6.6 10*3/uL (ref 4.0–10.5)

## 2019-04-17 LAB — COMPREHENSIVE METABOLIC PANEL
ALT: 24 U/L (ref 0–53)
AST: 24 U/L (ref 0–37)
Albumin: 4.3 g/dL (ref 3.5–5.2)
Alkaline Phosphatase: 59 U/L (ref 39–117)
BUN: 21 mg/dL (ref 6–23)
CO2: 30 mEq/L (ref 19–32)
Calcium: 10.1 mg/dL (ref 8.4–10.5)
Chloride: 99 mEq/L (ref 96–112)
Creatinine, Ser: 1.17 mg/dL (ref 0.40–1.50)
GFR: 61.39 mL/min (ref >60.00)
Glucose, Bld: 117 mg/dL — ABNORMAL HIGH (ref 70–99)
Potassium: 4.4 mEq/L (ref 3.5–5.1)
Sodium: 137 mEq/L (ref 135–145)
Total Bilirubin: 1.7 mg/dL — ABNORMAL HIGH (ref 0.2–1.2)
Total Protein: 6.6 g/dL (ref 6.0–8.3)

## 2019-04-17 LAB — LIPID PANEL
Cholesterol: 113 mg/dL (ref 0–200)
HDL: 48.5 mg/dL (ref >39.00)
LDL Cholesterol: 56 mg/dL (ref 0–99)
NonHDL: 64.2
Total CHOL/HDL Ratio: 2
Triglycerides: 40 mg/dL (ref 0.0–149.0)
VLDL: 8 mg/dL (ref 0.0–40.0)

## 2019-04-17 LAB — T4, FREE: Free T4: 1.08 ng/dL (ref 0.60–1.60)

## 2019-04-17 LAB — HEMOGLOBIN A1C: Hgb A1c MFr Bld: 6.2 % (ref 4.6–6.5)

## 2019-04-17 LAB — TSH: TSH: 0.56 u[IU]/mL (ref 0.35–4.50)

## 2019-04-17 LAB — T3, FREE: T3, Free: 3.2 pg/mL (ref 2.3–4.2)

## 2019-04-17 NOTE — Progress Notes (Addendum)
Subjective:    Patient ID: Donald Jones, male    DOB: 02/02/1946, 73 y.o.   MRN: [id number]  DOS:  04/17/2019 Type of visit - description: Virtual Visit via Video Note  I connected with the above patient  by a video enabled telemedicine application and verified that I am speaking with the correct person using two identifiers.   THIS ENCOUNTER IS A VIRTUAL VISIT DUE TO COVID-19 - PATIENT WAS NOT SEEN IN THE OFFICE. PATIENT HAS CONSENTED TO VIRTUAL VISIT / TELEMEDICINE VISIT   Location of patient: home  Location of provider: office  I discussed the limitations of evaluation and management by telemedicine and the availability of in person appointments. The patient expressed understanding and agreed to proceed.   CPX No major concerns Recently seen by pulmonary, expecting to get  a new CPAP machine Was seen at a urgent care, had LUTS, was prescribed antibiotics, urine culture negative, no symptoms at this point   Review of Systems Occasional lower extremity edema at the end of the day if he stays up for long time.  No chest pain no difficulty breathing  Other than above, a 14 point review of systems is negative    Past Medical History:  Diagnosis Date  . Arthritis    hands right  . Diabetes mellitus without complication ([place])   . GERD (gastroesophageal reflux disease)   . Hypertension   . Kidney stones    several episodes.  Last one in 2011.    . Obesity    100-lb weight loss since 2011.   . Polycythemia   . Sleep apnea    on Cpap    Past Surgical History:  Procedure Laterality Date  . CHOLECYSTECTOMY    . HAND SURGERY  2010   R hand , d/t a injury, 3 surgeries     Social History   Socioeconomic History  . Marital status: Married    Spouse name: Not on file  . Number of children: 3   . Years of education: Not on file  . Highest education level: Not on file  Occupational History  . Occupation: LANDSCAPING  Tobacco Use  . Smoking status: Never Smoker  .  Smokeless tobacco: Never Used  Substance and Sexual Activity  . Alcohol use: No    Comment: none for 15 years  . Drug use: No  . Sexual activity: Not Currently  Other Topics Concern  . Not on file  Social History Narrative   1ST WIFE DIED FROM LUNG CANCER, 2ND WIFE DIED FROM RENAL CANCER.     Married x 4 , lives w/ wife   3 sisters and 3 brother : lost 2 sisters    Social Determinants of Radio broadcast assistant Strain:   . Difficulty of Paying Living Expenses: Not on file  Food Insecurity:   . Worried About Charity fundraiser in the Last Year: Not on file  . Ran Out of Food in the Last Year: Not on file  Transportation Needs:   . Lack of Transportation (Medical): Not on file  . Lack of Transportation (Non-Medical): Not on file  Physical Activity:   . Days of Exercise per Week: Not on file  . Minutes of Exercise per Session: Not on file  Stress:   . Feeling of Stress : Not on file  Social Connections:   . Frequency of Communication with Friends and Family: Not on file  . Frequency of Social Gatherings with Friends and Family:  Not on file  . Attends Religious Services: Not on file  . Active Member of Clubs or Organizations: Not on file  . Attends Archivist Meetings: Not on file  . Marital Status: Not on file  Intimate Partner Violence:   . Fear of Current or Ex-Partner: Not on file  . Emotionally Abused: Not on file  . Physically Abused: Not on file  . Sexually Abused: Not on file     Family History  Problem Relation Age of Onset  . Diabetes Mother   . Coronary artery disease Mother   . Stroke Father        M and F  . Hypertension Father   . Heart disease Sister        ?  Marland Kitchen Alcohol abuse Sister   . Colon cancer Sister 38       age ~ 53  . Prostate cancer Neg Hx   . Stomach cancer Neg Hx   . Rectal cancer Neg Hx   . Esophageal cancer Neg Hx      Allergies as of 04/17/2019      Reactions   Sulfa Antibiotics Nausea And Vomiting        Medication List       Accurate as of April 17, 2019 11:59 PM. If you have any questions, ask your nurse or doctor.        acetaminophen 325 MG tablet Commonly known as: TYLENOL Take 650 mg by mouth every 6 (six) hours as needed for pain.   amLODipine 10 MG tablet Commonly known as: NORVASC Take 1 tablet (10 mg total) by mouth daily.   aspirin 81 MG tablet Take 81 mg by mouth daily.   atorvastatin 40 MG tablet Commonly known as: LIPITOR Take 1 tablet (40 mg total) by mouth at bedtime.   Azelastine HCl 137 MCG/SPRAY Soln Place 2 sprays into the nose 2 (two) times daily.   diclofenac sodium 1 % Gel Commonly known as: VOLTAREN Apply 4 g topically 3 (three) times daily as needed.   dicyclomine 20 MG tablet Commonly known as: BENTYL Take 1 tablet (20 mg total) by mouth 3 (three) times daily before meals.   Flaxseed Oil 1000 MG Caps Take 2,000 mg by mouth daily.   hydrochlorothiazide 25 MG tablet Commonly known as: HYDRODIURIL Take 1 tablet (25 mg total) by mouth daily.   hydrocortisone 2.5 % cream Apply topically 2 (two) times daily.   metFORMIN 500 MG tablet Commonly known as: GLUCOPHAGE Take 1 tablet (500 mg total) by mouth 2 (two) times daily with a meal. What changed: when to take this   multivitamin tablet Take 1 tablet by mouth daily.   omeprazole 40 MG capsule Commonly known as: PRILOSEC Take 1 capsule (40 mg total) by mouth daily.   sildenafil 20 MG tablet Commonly known as: REVATIO TAKE 3 TO 4 TABLETS BY MOUTH AT BEDTIME AS NEEDED.   sitaGLIPtin 100 MG tablet Commonly known as: Januvia Take 1 tablet (100 mg total) by mouth daily.           Objective:   Physical Exam BP (!) 141/75   Pulse (!) 54  This is a virtual video visit, he is alert oriented x3, in no apparent distress, good spirits.    Assessment    ASSESSMENT DM (pre-DM until 2016) HTN Polycythemia- sees hematology, likely d/t OSA-diuretics, JAK2 (-) ~ 2014, last OV 10-2014,  f/u prn OSA on CPAP DJD- saw Dr Rhona Raider before  R Foot  drop  GI: -GERD - IBS? GI sx on and off, previously dx w/ IBS -Cscopes: [id number], 09/18/2017 - EGD 04/2018, showed gastritis, no H. pylori, no malignancy Urolithiasis, several episodes   PLAN: Here for CPX DM: Good compliance, Ambulatory CBGs in the 120s.  Good compliance with Metformin and Januvia.  Will check a A1c. HTN: Continue amlodipine.  Does have mild edema at the end of the day, mostly at the dorsum of the feet.  Observation for now Polycythemia: Checking a CBC OSA: About to get a new CPAP machine. Decreased TSH: Check TFTs  I discussed the assessment and treatment plan with the patient. The patient was provided an opportunity to ask questions and all were answered. The patient agreed with the plan and demonstrated an understanding of the instructions.   The patient was advised to call back or seek an in-person evaluation if the symptoms worsen or if the condition fails to improve as anticipated.

## 2019-04-17 NOTE — Telephone Encounter (Signed)
I called and spoke with Melissa and she states that she spoke with the patient and he states that he does not want supplies until he gets a new machine. I advised her that per Dr. Bari Mantis note it is for a new machine and a new mask and all and she states that the way it came over to her it looks like just supplies. She also states that she looked everywhere in Epic and could not find a sleep study. He will have to have a new one if the one he had cant be found.

## 2019-04-19 NOTE — Assessment & Plan Note (Signed)
-  Td 2018, pneumonia shot 2014; prevnar: 01-2015 -  Zoster  2011; s/p  shingrix x 2 - had a   flu shot  -CCS: cscope ~ 2008 in [place],  [employer] again 05-2012, Cscope 09/2017, next per GI  Dr Ardis Hughs   - prostrate ca screening: DRE- PSA wnl 2019 - labs: CMP, FLP, CBC, A1c, TSH, free T3, free T4 - diet exercise discussed

## 2019-04-19 NOTE — Assessment & Plan Note (Signed)
Here for CPX DM: Good compliance, Ambulatory CBGs in the 120s.  Good compliance with Metformin and Januvia.  Will check a A1c. HTN: Continue amlodipine.  Does have mild edema at the end of the day, mostly at the dorsum of the feet.  Observation for now Polycythemia: Checking a CBC OSA: About to get a new CPAP machine. Decreased TSH: Check TFTs

## 2019-04-21 NOTE — Progress Notes (Signed)
Cardiology Office Note   Date:  04/22/2019   ID:  Donald Jones, DOB [birth year], MRN [id number]  PCP:  Colon Branch, MD    No chief complaint on file.    Wt Readings from Last 3 Encounters:  04/22/19 215 lb 3.2 oz (97.6 kg)  03/28/19 213 lb 9.6 oz (96.9 kg)  01/10/19 215 lb (97.5 kg)       History of Present Illness: Donald Jones is a 74 y.o. male  Who I saw for chest pain in 12/19.  He has had chronic GI issues and endoscopy was planned.    Stress test in 12/19 showed:  "The left ventricular ejection fraction is normal (55-65%).  Nuclear stress EF: 60%.  Blood pressure demonstrated a normal response to exercise.  The study is normal.  This is a low risk study.   Normal resting and stress perfusion. No ischemia or infarction EF 60% Baseline ECG with ST changes and artifact during stress so nondiagnostic Patient achieved only 87% PMHR Normal hemodynamic response"  Since last year, no further chest pain.  He has not been walking much.  He stopped going to the gym due to [place]. He wears his mask and distances from people.   Denies : Chest pain. Dizziness. Leg edema. Nitroglycerin use. Orthopnea. Palpitations. Paroxysmal nocturnal dyspnea. Shortness of breath. Syncope.     Past Medical History:  Diagnosis Date  . Arthritis    hands right  . Diabetes mellitus without complication ([place])   . GERD (gastroesophageal reflux disease)   . Hypertension   . Kidney stones    several episodes.  Last one in 2011.    . Obesity    100-lb weight loss since 2011.   . Polycythemia   . Sleep apnea    on Cpap    Past Surgical History:  Procedure Laterality Date  . CHOLECYSTECTOMY    . HAND SURGERY  2010   R hand , d/t a injury, 3 surgeries      Current Outpatient Medications  Medication Sig Dispense Refill  . acetaminophen (TYLENOL) 325 MG tablet Take 650 mg by mouth every 6 (six) hours as needed for pain.     Marland Kitchen amLODipine (NORVASC) 10 MG tablet Take 1 tablet  (10 mg total) by mouth daily. 90 tablet 1  . aspirin 81 MG tablet Take 81 mg by mouth daily.    Marland Kitchen atorvastatin (LIPITOR) 40 MG tablet Take 1 tablet (40 mg total) by mouth at bedtime. 90 tablet 1  . Azelastine HCl 137 MCG/SPRAY SOLN Place 2 sprays into the nose 2 (two) times daily. 30 mL 6  . diclofenac sodium (VOLTAREN) 1 % GEL Apply 4 g topically 3 (three) times daily as needed. 100 g 3  . dicyclomine (BENTYL) 20 MG tablet Take 1 tablet (20 mg total) by mouth 3 (three) times daily before meals. 270 tablet 1  . Flaxseed, Linseed, (FLAXSEED OIL) 1000 MG CAPS Take 2,000 mg by mouth daily.     . hydrochlorothiazide (HYDRODIURIL) 25 MG tablet Take 1 tablet (25 mg total) by mouth daily. 90 tablet 3  . hydrocortisone 2.5 % cream Apply topically 2 (two) times daily. 60 g 1  . metFORMIN (GLUCOPHAGE) 500 MG tablet Take 1 tablet (500 mg total) by mouth 2 (two) times daily with a meal. (Patient taking differently: Take 500 mg by mouth daily with breakfast. ) 60 tablet 5  . Multiple Vitamin (MULTIVITAMIN) tablet Take 1 tablet by mouth daily.     Marland Kitchen  omeprazole (PRILOSEC) 40 MG capsule Take 1 capsule (40 mg total) by mouth daily. 90 capsule 3  . sildenafil (REVATIO) 20 MG tablet TAKE 3 TO 4 TABLETS BY MOUTH AT BEDTIME AS NEEDED. 30 tablet 3  . sitaGLIPtin (JANUVIA) 100 MG tablet Take 1 tablet (100 mg total) by mouth daily. 30 tablet 1   No current facility-administered medications for this visit.    Allergies:   Sulfa antibiotics    Social History:  The patient  reports that he has never smoked. He has never used smokeless tobacco. He reports that he does not drink alcohol or use drugs.   Family History:  The patient's family history includes Alcohol abuse in his sister; Colon cancer (age of onset: 3) in his sister; Coronary artery disease in his mother; Diabetes in his mother; Heart disease in his sister; Hypertension in his father; Stroke in his father.    ROS:  Please see the history of present  illness.   Otherwise, review of systems are positive for random arm tingling in the arm if he is leaning on it.   All other systems are reviewed and negative.    PHYSICAL EXAM: VS:  BP (!) 150/80   Pulse 61   Ht 5\' 5"  (1.651 m)   Wt 215 lb 3.2 oz (97.6 kg)   SpO2 98%   BMI 35.81 kg/m  , BMI Body mass index is 35.81 kg/m. GEN: Well nourished, well developed, in no acute distress  HEENT: normal  Neck: no JVD, carotid bruits, or masses Cardiac: RRR; no murmurs, rubs, or gallops,no edema  Respiratory:  clear to auscultation bilaterally, normal work of breathing GI: soft, nontender, nondistended, + BS, obese MS: no deformity or atrophy , 2+ left radial pulse Skin: warm and dry, no rash Neuro:  Strength and sensation are intact Psych: euthymic mood, full affect   EKG:   The ekg ordered today demonstrates normal ECG   Recent Labs: 04/17/2019: ALT 24; BUN 21; Creatinine, Ser 1.17; Hemoglobin 16.4; Platelets 201.0; Potassium 4.4; Sodium 137; TSH 0.56   Lipid Panel    Component Value Date/Time   CHOL 113 04/17/2019 0952   TRIG 40.0 04/17/2019 0952   HDL 48.50 04/17/2019 0952   CHOLHDL 2 04/17/2019 0952   VLDL 8.0 04/17/2019 0952   LDLCALC 56 04/17/2019 0952     Other studies Reviewed: Additional studies/ records that were reviewed today with results demonstrating: stress test normal in 2019.   ASSESSMENT AND PLAN:  1.   Chest pain: Resolved.  We spoke about prevention. He has a treadmill at home, but he is not using it.  2.   Type 2 DM: A1C down to 6.2.  He is being more strict about his diet. 3.   Obesity: Try to lose weight with whole food plant based diet.  Increase exercise.  4.  Hyperlipidemia: lipids well controlled.  5.  HTN: Readings at home are in the 0000000 range systolic. He requests annual f/u    Current medicines are reviewed at length with the patient today.  The patient concerns regarding his medicines were addressed.  The following changes have been  made:  No change  Labs/ tests ordered today include:  No orders of the defined types were placed in this encounter.   Recommend 150 minutes/week of aerobic exercise Low fat, low carb, high fiber diet recommended  Disposition:   FU in 1 year   Signed, Larae Grooms, MD  04/22/2019 3:08 PM    [facility] [place], [place], [place]  [address] Phone: [phone]; Fax: [phone]

## 2019-04-21 NOTE — Telephone Encounter (Signed)
Called and spoke with [facility] from [facility] to see if she ever found pt's sleep study. Per Lenna Sciara, it seemed like they had everything that they were needing and pt should be contact soon if he hadn't already been in regards to receiving the new machine. Nothing further needed.

## 2019-04-22 ENCOUNTER — Ambulatory Visit: Payer: BC Managed Care – PPO | Admitting: Interventional Cardiology

## 2019-04-22 ENCOUNTER — Other Ambulatory Visit: Payer: Self-pay

## 2019-04-22 ENCOUNTER — Encounter: Payer: Self-pay | Admitting: Interventional Cardiology

## 2019-04-22 VITALS — BP 150/80 | HR 61 | Ht 65.0 in | Wt 215.2 lb

## 2019-04-22 DIAGNOSIS — E782 Mixed hyperlipidemia: Secondary | ICD-10-CM

## 2019-04-22 DIAGNOSIS — I10 Essential (primary) hypertension: Secondary | ICD-10-CM

## 2019-04-22 DIAGNOSIS — R072 Precordial pain: Secondary | ICD-10-CM | POA: Diagnosis not present

## 2019-04-22 DIAGNOSIS — E119 Type 2 diabetes mellitus without complications: Secondary | ICD-10-CM

## 2019-04-22 NOTE — Patient Instructions (Signed)

## 2019-04-28 ENCOUNTER — Telehealth: Payer: Self-pay | Admitting: Pulmonary Disease

## 2019-04-28 DIAGNOSIS — G4733 Obstructive sleep apnea (adult) (pediatric): Secondary | ICD-10-CM

## 2019-04-28 NOTE — Telephone Encounter (Signed)
I called and spoke with Melissa with [employer] to confirm what she needed. She states that they received the order for his supplies, but not for a new CPAP machine. She advised me to send over a new order for that and they will take care of it. She states that they should have a copy of his sleep study on file if they were able to supply him with his supplies. I advised her that I will send the order right over. I called and left patient a voicemail that I have done this.

## 2019-05-05 DIAGNOSIS — G4733 Obstructive sleep apnea (adult) (pediatric): Secondary | ICD-10-CM | POA: Diagnosis not present

## 2019-05-13 ENCOUNTER — Ambulatory Visit
Admission: EM | Admit: 2019-05-13 | Discharge: 2019-05-13 | Disposition: A | Discharge status: Home / Self Care | Payer: BC Managed Care – PPO | Attending: Emergency Medicine | Admitting: Emergency Medicine

## 2019-05-13 ENCOUNTER — Encounter: Payer: Self-pay | Admitting: Emergency Medicine

## 2019-05-13 ENCOUNTER — Other Ambulatory Visit: Payer: Self-pay

## 2019-05-13 DIAGNOSIS — F419 Anxiety disorder, unspecified: Secondary | ICD-10-CM | POA: Diagnosis not present

## 2019-05-13 LAB — POCT URINALYSIS DIP (MANUAL ENTRY)
Bilirubin, UA: NEGATIVE
Blood, UA: NEGATIVE
Glucose, UA: NEGATIVE mg/dL
Leukocytes, UA: NEGATIVE
Nitrite, UA: NEGATIVE
Protein Ur, POC: NEGATIVE mg/dL
Spec Grav, UA: 1.015 (ref 1.010–1.025)
Urobilinogen, UA: 0.2 E.U./dL
pH, UA: 5.5 (ref 5.0–8.0)

## 2019-05-13 LAB — POCT FASTING CBG KUC MANUAL ENTRY: POCT Glucose (KUC): 134 mg/dL — AB (ref 70–99)

## 2019-05-13 NOTE — Discharge Instructions (Signed)
Recommend apps like Headspace, Calm for meditation and relaxation to help with anxiety state and anxious thoughts. Go to ER for chest pain, palpitations, lightheadedness, difficulty breathing.

## 2019-05-13 NOTE — ED Triage Notes (Signed)
Pt presents to [facility] for assessment of anxiety since waking up this morning, with loose stools and abdominal cramping.  States he had his COVID vaccine tonight as well.  States he feels like turning on the news makes his anxiety a lot worse, and then people talk about COVID and politics at work and it makes it worse.  Patient states he was urinating more today than he has been.  Patient also c/o left posterior shoulder/scapula pain, which he describes as a cramp, like gas pains.  C/o burning sensation to epigastric area.  C/o feeling like his pulse has been going up and down today.  States he was able to make it through a whole shift.  When he had a coworker tell him to "sit down and relax" today he sat down for a while and he began to feel better.

## 2019-05-13 NOTE — ED Notes (Signed)
Patient able to ambulate independently  

## 2019-05-13 NOTE — ED Provider Notes (Addendum)
[facility]    CSN: [id number] Arrival date & time:         History   Chief Complaint Chief Complaint  Patient presents with  . Anxiety    HPI Donald Jones is a 74 y.o. male with history of obesity, hypertension, diabetes, sleep apnea presenting for well check.  States has been feeling anxious since waking this morning and has been having loose stools without blood or melena or pain.  Generalized abdominal discomfort that feels "gassy".  Patient has also had bilateral shoulder pain that comes and goes over the last few weeks.  Also endorsing history of GERD for which he takes Prilosec with adequate relief.  Patient also noting urinary frequency since this morning.  Denies penile or testicular pain, swelling, burning with urination, meatal irritation.  No polydipsia, polyphagia.  Patient states he was seen for this previously at this facility in October 2020.  Urine dipstick was unremarkable at that time.  Patient is unsure if he filled antibiotics or not, the states symptoms away.  Denies formal diagnosis of anxiety, though states both his father and son have anxiety.  Patient denies SI/HI.  Feels this is worsened by listening to the news, discussing politics with coworkers.  Also feels global Covid pandemic has exacerbated this as well.  Received his first dose of Covid vaccine around 5 PM: Denies lightheadedness, headache, change in vision, chest pain, shortness of breath, swelling of lips, tongue, mouth.    Past Medical History:  Diagnosis Date  . Arthritis    hands right  . Diabetes mellitus without complication ([place])   . GERD (gastroesophageal reflux disease)   . Hypertension   . Kidney stones    several episodes.  Last one in 2011.    . Obesity    100-lb weight loss since 2011.   . Polycythemia   . Sleep apnea    on Cpap    Patient Active Problem List   Diagnosis Date Noted  . High cholesterol 01/20/2018  . DJD (degenerative joint disease) 09/06/2017  .  Erectile dysfunction 09/06/2017  . PCP NOTES >>> 01/23/2015  . Abnormal TSH 10/05/2014  . Diabetes mellitus without complication ([place]) [id number]  . Obesity (BMI 30-39.9) 12/03/2012  . GERD (gastroesophageal reflux disease) 09/24/2012  . Skin lesion 09/24/2012  . IBS ? 04/22/2012  . Polycythemia   . Annual physical exam 06/05/2011  . Foot drop, right 06/05/2011  . BACK PAIN 02/15/2009  . Obesity 06/14/2006  . HYPERTENSION, BENIGN SYSTEMIC 06/14/2006  . RHINITIS, ALLERGIC 06/14/2006  . NEPHROLITHIASIS 06/14/2006  . OSA (obstructive sleep apnea) 06/14/2006    Past Surgical History:  Procedure Laterality Date  . CHOLECYSTECTOMY    . HAND SURGERY  2010   R hand , d/t a injury, 3 surgeries        Home Medications    Prior to Admission medications   Medication Sig Start Date End Date Taking? Authorizing Provider  acetaminophen (TYLENOL) 325 MG tablet Take 650 mg by mouth every 6 (six) hours as needed for pain.     [provider]  amLODipine (NORVASC) 10 MG tablet Take 1 tablet (10 mg total) by mouth daily. 01/10/19   Colon Branch, MD  aspirin 81 MG tablet Take 81 mg by mouth daily.    [provider]  atorvastatin (LIPITOR) 40 MG tablet Take 1 tablet (40 mg total) by mouth at bedtime. 03/18/19   Colon Branch, MD  Azelastine HCl 137 MCG/SPRAY SOLN Place  2 sprays into the nose 2 (two) times daily. 07/23/18   Colon Branch, MD  diclofenac sodium (VOLTAREN) 1 % GEL Apply 4 g topically 3 (three) times daily as needed. 05/01/18   Colon Branch, MD  dicyclomine (BENTYL) 20 MG tablet Take 1 tablet (20 mg total) by mouth 3 (three) times daily before meals. 08/19/18   Colon Branch, MD  Flaxseed, Linseed, (FLAXSEED OIL) 1000 MG CAPS Take 2,000 mg by mouth daily.     [provider]  hydrochlorothiazide (HYDRODIURIL) 25 MG tablet Take 1 tablet (25 mg total) by mouth daily. 05/22/18   Colon Branch, MD  hydrocortisone 2.5 % cream Apply topically 2 (two) times daily. 08/21/16   Colon Branch, MD  metFORMIN (GLUCOPHAGE) 500 MG tablet Take 1 tablet (500 mg total) by mouth 2 (two) times daily with a meal. Patient taking differently: Take 500 mg by mouth daily with breakfast.  10/10/18   Colon Branch, MD  Multiple Vitamin (MULTIVITAMIN) tablet Take 1 tablet by mouth daily.     [provider]  omeprazole (PRILOSEC) 40 MG capsule Take 1 capsule (40 mg total) by mouth daily. 08/19/18   Colon Branch, MD  sildenafil (REVATIO) 20 MG tablet TAKE 3 TO 4 TABLETS BY MOUTH AT BEDTIME AS NEEDED. 12/30/18   Colon Branch, MD  sitaGLIPtin (JANUVIA) 100 MG tablet Take 1 tablet (100 mg total) by mouth daily. 04/07/19   Colon Branch, MD    Family History Family History  Problem Relation Age of Onset  . Diabetes Mother   . Coronary artery disease Mother   . Stroke Father        M and F  . Hypertension Father   . Heart disease Sister        ?  Marland Kitchen Alcohol abuse Sister   . Colon cancer Sister 80       age ~ 57  . Prostate cancer Neg Hx   . Stomach cancer Neg Hx   . Rectal cancer Neg Hx   . Esophageal cancer Neg Hx     Social History Social History   Tobacco Use  . Smoking status: Never Smoker  . Smokeless tobacco: Never Used  Substance Use Topics  . Alcohol use: No    Comment: none for 15 years  . Drug use: No     Allergies   Sulfa antibiotics   Review of Systems As per HPI   Physical Exam Triage Vital Signs ED Triage Vitals  Enc Vitals Group     BP      Pulse      Resp      Temp      Temp src      SpO2      Weight      Height      Head Circumference      Peak Flow      Pain Score      Pain Loc      Pain Edu?      Excl. in [place]?    No data found.  Updated Vital Signs BP (!) 166/93 (BP Location: Right Arm)   Pulse 92   Temp 98.2 F (36.8 C) (Oral)   Resp 16   SpO2 96%   Visual Acuity Right Eye Distance:   Left Eye Distance:   Bilateral Distance:    Right Eye Near:   Left Eye Near:    Bilateral Near:  Physical Exam Constitutional:       General: He is not in acute distress.    Appearance: He is obese. He is not ill-appearing or diaphoretic.  HENT:     Head: Normocephalic and atraumatic.     Mouth/Throat:     Mouth: Mucous membranes are moist.     Pharynx: Oropharynx is clear. No oropharyngeal exudate or posterior oropharyngeal erythema.     Comments: No lip swelling Eyes:     General: No scleral icterus.    Conjunctiva/sclera: Conjunctivae normal.     Pupils: Pupils are equal, round, and reactive to light.  Cardiovascular:     Rate and Rhythm: Normal rate and regular rhythm.     Heart sounds: No murmur. No gallop.   Pulmonary:     Effort: Pulmonary effort is normal. No respiratory distress.     Breath sounds: No wheezing, rhonchi or rales.  Abdominal:     General: Bowel sounds are normal.     Tenderness: There is no abdominal tenderness.  Musculoskeletal:        General: No tenderness. Normal range of motion.     Cervical back: Normal range of motion and neck supple. No tenderness.     Right lower leg: No edema.     Left lower leg: No edema.  Lymphadenopathy:     Cervical: No cervical adenopathy.  Skin:    Capillary Refill: Capillary refill takes less than 2 seconds.     Coloration: Skin is not jaundiced or pale.     Findings: No rash.  Neurological:     Mental Status: He is alert and oriented to person, place, and time.  Psychiatric:     Comments: Patient appears to be mildly anxious, though pleasant affect, good eye contact, consolable      UC Treatments / Results  Labs (all labs ordered are listed, but only abnormal results are displayed) Labs Reviewed  POCT URINALYSIS DIP (MANUAL ENTRY) - Abnormal; Notable for the following components:      Result Value   Ketones, POC UA moderate (40) (*)    All other components within normal limits    EKG   Radiology No results found.  Procedures Procedures (including critical care time)  Medications Ordered in UC Medications - No data to  display  Initial Impression / Assessment and Plan / UC Course  I have reviewed the triage vital signs and the nursing notes.  Pertinent labs & imaging results that were available during my care of the patient were reviewed by me and considered in my medical decision making (see chart for details).     Patient afebrile, nontoxic in office today.  Mildly hypertensive and anxious, though consolable.  Urine dipstick showing ketones, otherwise remarkable-culture deferred.  CBG: 134.  Low concern for acute process such as HHS, DKA, ACS.  Patient denies history of thyroid disease and had normal thyroid work-up in December.  Patient to follow-up with PCP, practice self soothing/calming techniques including meditation, yoga, setting boundaries with coworkers.  Return precautions discussed, patient verbalized understanding and is agreeable to plan. Final Clinical Impressions(s) / UC Diagnoses   Final diagnoses:  None     Discharge Instructions     Recommend apps like Headspace, Calm for meditation and relaxation to help with anxiety state and anxious thoughts. Go to ER for chest pain, palpitations, lightheadedness, difficulty breathing.    ED Prescriptions    None     PDMP not reviewed this encounter.   [place], [place], [place]  05/16/19 [place], [place], PA-C 05/16/19 [phone]

## 2019-05-14 ENCOUNTER — Other Ambulatory Visit: Payer: Self-pay | Admitting: Internal Medicine

## 2019-05-20 ENCOUNTER — Other Ambulatory Visit: Payer: Self-pay

## 2019-05-21 ENCOUNTER — Ambulatory Visit: Payer: BC Managed Care – PPO | Admitting: Medical

## 2019-05-21 ENCOUNTER — Encounter: Payer: Self-pay | Admitting: Medical

## 2019-05-21 VITALS — BP 139/79 | HR 81 | Temp 96.6°F | Resp 12 | Ht 65.0 in | Wt 207.8 lb

## 2019-05-21 DIAGNOSIS — K589 Irritable bowel syndrome without diarrhea: Secondary | ICD-10-CM | POA: Diagnosis not present

## 2019-05-21 DIAGNOSIS — F419 Anxiety disorder, unspecified: Secondary | ICD-10-CM

## 2019-05-21 DIAGNOSIS — R195 Other fecal abnormalities: Secondary | ICD-10-CM | POA: Diagnosis not present

## 2019-05-21 DIAGNOSIS — K219 Gastro-esophageal reflux disease without esophagitis: Secondary | ICD-10-CM

## 2019-05-21 MED ORDER — BUSPIRONE HCL 7.5 MG PO TABS: 7.5000 mg | ORAL_TABLET | Freq: Two times a day (BID) | ORAL | 0 refills | Status: DC

## 2019-05-21 NOTE — Progress Notes (Signed)
Subjective:    Patient ID: Donald Jones, male    DOB: 30-Oct-1945, 74 y.o.   MRN: [id number]  HPI  Pt in for evaluation. Pt states he is feeling anxious overall. He tells me he got covid vaccine May 13, 2019. Shortly afterwards had generalized abdomen pain and felt gasy. This was causing him anxiety per his report.  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 So working during pandemic stresses him out.  He was seen in the [facility] the other day. HPI from ED 1-26-2021visit reads.  "Donald Jones is a 74 y.o. male with history of obesity, hypertension, diabetes, sleep apnea presenting for well check.  States has been feeling anxious since waking this morning and has been having loose stools without blood or melena or pain.  Generalized abdominal discomfort that feels "gassy".  Patient has also had bilateral shoulder pain that comes and goes over the last few weeks.  Also endorsing history of GERD for which he takes Prilosec with adequate relief.  Patient also noting urinary frequency since this morning.  Denies penile or testicular pain, swelling, burning with urination, meatal irritation.  No polydipsia, polyphagia.  Patient states he was seen for this previously at this facility in October 2020.  Urine dipstick was unremarkable at that time.  Patient is unsure if he filled antibiotics or not, the states symptoms away.  Denies formal diagnosis of anxiety, though states both his father and son have anxiety.  Patient denies SI/HI.  Feels this is worsened by listening to the news, discussing politics with coworkers.  Also feels global Covid pandemic has exacerbated this as well.  Received his first dose of Covid vaccine around 5 PM: Denies lightheadedness, headache, change in vision, chest pain, shortness of breath, swelling of lips, tongue, mouth."   Pt states he was discharge by urgent care and he felt better.  Yesterday morning he had recurrent epigastric burning. When he  scheduled the appointment he was advised mylanta. He used that and burning epigastric region improved a lot. Yesterday he forgot to take prilosec. He was belching some yesterday.  Pt does report history of IBS. He has hx of ibs. He states some on and off symptoms. He states he has some mixed features of constipation and loose stools. Recent constipation but then yesterday mild 2 loose stools.  Early January pt had cardiologist appointment.   Cardioloist hpi and A/P  History of Present Illness: Donald Jones is a 74 y.o. male  Who I saw for chest pain in 12/19.  He has had chronic GI issues and endoscopy was planned.    Stress test in 12/19 showed:  "The left ventricular ejection fraction is normal (55-65%).  Nuclear stress EF: 60%.  Blood pressure demonstrated a normal response to exercise.  The study is normal.  This is a low risk study.  Normal resting and stress perfusion. No ischemia or infarction EF 60% Baseline ECG with ST changes and artifact during stress so nondiagnostic Patient achieved only 87% PMHR Normal hemodynamic response"  Since last year, no further chest pain.  He has not been walking much.  He stopped going to the gym due to [place]. He wears his mask and distances from people.   Denies : Chest pain. Dizziness. Leg edema. Nitroglycerin use. Orthopnea. Palpitations. Paroxysmal nocturnal dyspnea. Shortness of breath. Syncope.   ASSESSMENT AND PLAN:  1.   Chest pain: Resolved.  We spoke about prevention. He has a treadmill at home, but he  is not using it.  2.   Type 2 DM: A1C down to 6.2.  He is being more strict about his diet. 3.   Obesity: Try to lose weight with whole food plant based diet.  Increase exercise.  4.  Hyperlipidemia: lipids well controlled.  5.  HTN: Readings at home are in the 0000000 range systolic. He requests annual f/u    Current medicines are reviewed at length with the patient today.  The patient concerns regarding his  medicines were addressed.  The following changes have been made:  No change  Labs/ tests ordered today include:  No orders of the defined types were placed in this encounter.   Recommend 150 minutes/week of aerobic exercise Low fat, low carb, high fiber diet recommended  Disposition:   FU in 1 year    Review of Systems  Constitutional: Negative for chills, fatigue and fever.  HENT: Negative for congestion, ear pain, sinus pressure and sore throat.   Respiratory: Negative for cough, chest tightness, shortness of breath and wheezing.   Cardiovascular: Negative for chest pain and palpitations.       No chest pain. No jaw pain, no lt arm/shoulder pain, no sob, or sweating as well.  Gastrointestinal: Positive for abdominal pain. Negative for nausea, rectal pain and vomiting.       Epigstric burn.  Musculoskeletal: Negative for back pain and neck pain.  Hematological: Negative for adenopathy. Does not bruise/bleed easily.  Psychiatric/Behavioral: Negative for behavioral problems and confusion.    Past Medical History:  Diagnosis Date  . Arthritis    hands right  . Diabetes mellitus without complication ([place])   . GERD (gastroesophageal reflux disease)   . Hypertension   . Kidney stones    several episodes.  Last one in 2011.    . Obesity    100-lb weight loss since 2011.   . Polycythemia   . Sleep apnea    on Cpap     Social History   Socioeconomic History  . Marital status: Married    Spouse name: Not on file  . Number of children: 3   . Years of education: Not on file  . Highest education level: Not on file  Occupational History  . Occupation: LANDSCAPING  Tobacco Use  . Smoking status: Never Smoker  . Smokeless tobacco: Never Used  Substance and Sexual Activity  . Alcohol use: No    Comment: none for 15 years  . Drug use: No  . Sexual activity: Not Currently  Other Topics Concern  . Not on file  Social History Narrative   1ST WIFE DIED FROM LUNG  CANCER, 2ND WIFE DIED FROM RENAL CANCER.     Married x 4 , lives w/ wife   3 sisters and 3 brother : lost 2 sisters    Social Determinants of Radio broadcast assistant Strain:   . Difficulty of Paying Living Expenses: Not on file  Food Insecurity:   . Worried About Charity fundraiser in the Last Year: Not on file  . Ran Out of Food in the Last Year: Not on file  Transportation Needs:   . Lack of Transportation (Medical): Not on file  . Lack of Transportation (Non-Medical): Not on file  Physical Activity:   . Days of Exercise per Week: Not on file  . Minutes of Exercise per Session: Not on file  Stress:   . Feeling of Stress : Not on file  Social Connections:   .  Frequency of Communication with Friends and Family: Not on file  . Frequency of Social Gatherings with Friends and Family: Not on file  . Attends Religious Services: Not on file  . Active Member of Clubs or Organizations: Not on file  . Attends Archivist Meetings: Not on file  . Marital Status: Not on file  Intimate Partner Violence:   . Fear of Current or Ex-Partner: Not on file  . Emotionally Abused: Not on file  . Physically Abused: Not on file  . Sexually Abused: Not on file    Past Surgical History:  Procedure Laterality Date  . CHOLECYSTECTOMY    . HAND SURGERY  2010   R hand , d/t a injury, 3 surgeries     Family History  Problem Relation Age of Onset  . Diabetes Mother   . Coronary artery disease Mother   . Stroke Father        M and F  . Hypertension Father   . Heart disease Sister        ?  Marland Kitchen Alcohol abuse Sister   . Colon cancer Sister 76       age ~ 52  . Prostate cancer Neg Hx   . Stomach cancer Neg Hx   . Rectal cancer Neg Hx   . Esophageal cancer Neg Hx     Allergies  Allergen Reactions  . Sulfa Antibiotics Nausea And Vomiting    Current Outpatient Medications on File Prior to Visit  Medication Sig Dispense Refill  . acetaminophen (TYLENOL) 325 MG tablet Take 650 mg  by mouth every 6 (six) hours as needed for pain.     Marland Kitchen amLODipine (NORVASC) 10 MG tablet Take 1 tablet (10 mg total) by mouth daily. 90 tablet 1  . aspirin 81 MG tablet Take 81 mg by mouth daily.    Marland Kitchen atorvastatin (LIPITOR) 40 MG tablet Take 1 tablet (40 mg total) by mouth at bedtime. 90 tablet 1  . Azelastine HCl 137 MCG/SPRAY SOLN Place 2 sprays into the nose 2 (two) times daily. 30 mL 6  . diclofenac sodium (VOLTAREN) 1 % GEL Apply 4 g topically 3 (three) times daily as needed. 100 g 3  . dicyclomine (BENTYL) 20 MG tablet Take 1 tablet (20 mg total) by mouth 3 (three) times daily before meals. 270 tablet 1  . Flaxseed, Linseed, (FLAXSEED OIL) 1000 MG CAPS Take 2,000 mg by mouth daily.     . hydrochlorothiazide (HYDRODIURIL) 25 MG tablet Take 1 tablet (25 mg total) by mouth daily. 90 tablet 3  . hydrocortisone 2.5 % cream Apply topically 2 (two) times daily. 60 g 1  . metFORMIN (GLUCOPHAGE) 500 MG tablet Take 1 tablet (500 mg total) by mouth 2 (two) times daily with a meal. (Patient taking differently: Take 500 mg by mouth daily with breakfast. ) 60 tablet 5  . Multiple Vitamin (MULTIVITAMIN) tablet Take 1 tablet by mouth daily.     Marland Kitchen omeprazole (PRILOSEC) 40 MG capsule Take 1 capsule (40 mg total) by mouth daily. 90 capsule 3  . sildenafil (REVATIO) 20 MG tablet TAKE 3 TO 4 TABLETS BY MOUTH AT BEDTIME AS NEEDED. 30 tablet 3  . sitaGLIPtin (JANUVIA) 100 MG tablet Take 1 tablet (100 mg total) by mouth daily. 30 tablet 1   No current facility-administered medications on file prior to visit.    BP (!) 151/79 (BP Location: Right Arm, Cuff Size: Large)   Pulse 81   Temp (!) 96.6 F (  35.9 C) (Temporal)   Resp 12   Ht 5\' 5"  (1.651 m)   Wt 207 lb 12.8 oz (94.3 kg)   SpO2 100%   BMI 34.58 kg/m       Objective:   Physical Exam  General- No acute distress. Pleasant patient. Neck- Full range of motion, no jvd Lungs- Clear, even and unlabored. Heart- regular rate and rhythm. Neurologic-  CNII- XII grossly intact. Abdomen- soft, nt, nd, +bs, no rebound or gurading.      Assessment & Plan:  Your recent epigastric pain intermittently as well as IBS type symptoms.  On review history of GERD and on discussion you describe mixture of IBS constipation and diarrhea.  Epigastric region discomfort did improve yesterday with Mylanta.  Also you forgot to take the Prilosec yesterday.  I recommend that she get back on daily Prilosec.  Eat healthy diet as we discussed.  No cardiac symptoms presently and on review I did see reassuring negative cardiac work-up early January.  If you have any changes and symptoms/cardiac type in nature then recommend ED evaluation.  Recent IBS type symptoms involving history of constipation last week and over the last 24 hours to loose stools.  I recommend that you try 1 tablespoon of Metamucil in 8 ounces of water 3 times daily.  This should help.  If you start to get a lot of loose stools as discussed then turn in gastro panel.  However presently doubt infectious cause.  You do have described anxiety moderately -severe.  Will give low-dose BuSpar and see if this helps.  Follow-up in 2 weeks with PCP or as needed.  30 minutes spent with patient today.  50% time spent counseling patient on plan going forward.

## 2019-05-21 NOTE — Patient Instructions (Signed)
Your recent epigastric pain intermittently as well as IBS type symptoms.  On review history of GERD and on discussion you describe mixture of IBS constipation and diarrhea.  Epigastric region discomfort did improve yesterday with Mylanta.  Also you forgot to take the Prilosec yesterday.  I recommend that she get back on daily Prilosec.  Eat healthy diet as we discussed.  No cardiac symptoms presently and on review I did see reassuring negative cardiac work-up early January.  If you have any changes and symptoms/cardiac type in nature then recommend ED evaluation.  Recent IBS type symptoms involving history of constipation last week and over the last 24 hours to loose stools.  I recommend that you try 1 tablespoon of Metamucil in 8 ounces of water 3 times daily.  This should help.  If you start to get a lot of loose stools as discussed then turn in gastro panel.  However presently doubt infectious cause.  You do have described anxiety moderately -severe.  Will give low-dose BuSpar and see if this helps.  Follow-up in 2 weeks with PCP or as needed.

## 2019-05-23 ENCOUNTER — Ambulatory Visit: Payer: BC Managed Care – PPO | Admitting: Internal Medicine

## 2019-05-27 ENCOUNTER — Encounter (HOSPITAL_COMMUNITY): Payer: Self-pay | Admitting: Emergency Medicine

## 2019-05-27 ENCOUNTER — Emergency Department (HOSPITAL_COMMUNITY): Payer: BC Managed Care – PPO

## 2019-05-27 ENCOUNTER — Other Ambulatory Visit: Payer: Self-pay

## 2019-05-27 ENCOUNTER — Emergency Department (HOSPITAL_COMMUNITY)
Admission: EM | Admit: 2019-05-27 | Discharge: 2019-05-27 | Disposition: A | Discharge status: Home / Self Care | Payer: BC Managed Care – PPO | Attending: Emergency Medicine | Admitting: Emergency Medicine

## 2019-05-27 DIAGNOSIS — K219 Gastro-esophageal reflux disease without esophagitis: Secondary | ICD-10-CM | POA: Diagnosis not present

## 2019-05-27 DIAGNOSIS — R072 Precordial pain: Secondary | ICD-10-CM | POA: Diagnosis not present

## 2019-05-27 DIAGNOSIS — R0789 Other chest pain: Secondary | ICD-10-CM | POA: Diagnosis not present

## 2019-05-27 LAB — CBC
HCT: 51.6 % (ref 39.0–52.0)
Hemoglobin: 18.1 g/dL — ABNORMAL HIGH (ref 13.0–17.0)
MCH: 31.5 pg (ref 26.0–34.0)
MCHC: 35.1 g/dL (ref 30.0–36.0)
MCV: 89.9 fL (ref 80.0–100.0)
Platelets: 258 10*3/uL (ref 150–400)
RBC: 5.74 MIL/uL (ref 4.22–5.81)
RDW: 12.6 % (ref 11.5–15.5)
WBC: 9.3 10*3/uL (ref 4.0–10.5)
nRBC: 0 % (ref 0.0–0.2)

## 2019-05-27 LAB — TROPONIN I (HIGH SENSITIVITY)
Troponin I (High Sensitivity): 6 ng/L (ref <18)
Troponin I (High Sensitivity): 6 ng/L (ref <18)

## 2019-05-27 LAB — BASIC METABOLIC PANEL
Anion gap: 12 (ref 5–15)
BUN: 17 mg/dL (ref 8–23)
CO2: 27 mmol/L (ref 22–32)
Calcium: 10.7 mg/dL — ABNORMAL HIGH (ref 8.9–10.3)
Chloride: 98 mmol/L (ref 98–111)
Creatinine, Ser: 1.28 mg/dL — ABNORMAL HIGH (ref 0.61–1.24)
GFR calc Af Amer: >60 mL/min (ref >60)
GFR calc non Af Amer: 56 mL/min — ABNORMAL LOW (ref >60)
Glucose, Bld: 119 mg/dL — ABNORMAL HIGH (ref 70–99)
Potassium: 4 mmol/L (ref 3.5–5.1)
Sodium: 137 mmol/L (ref 135–145)

## 2019-05-27 MED ORDER — SODIUM CHLORIDE 0.9% FLUSH: 3.0000 mL | Freq: Once | INTRAVENOUS | Status: DC

## 2019-05-27 MED ORDER — ALUM & MAG HYDROXIDE-SIMETH 200-200-20 MG/5ML PO SUSP
30.0000 mL | Freq: Once | ORAL | Status: AC
  Administered 2019-05-27: 30 mL via ORAL
  Filled 2019-05-27: qty 30

## 2019-05-27 MED ORDER — ACETAMINOPHEN 325 MG PO TABS
650.0000 mg | ORAL_TABLET | Freq: Once | ORAL | Status: AC
  Administered 2019-05-27: 650 mg via ORAL
  Filled 2019-05-27: qty 2

## 2019-05-27 MED ORDER — FAMOTIDINE 20 MG PO TABS
20.0000 mg | ORAL_TABLET | Freq: Once | ORAL | Status: AC
  Administered 2019-05-27: 20 mg via ORAL
  Filled 2019-05-27: qty 1

## 2019-05-27 NOTE — Discharge Instructions (Addendum)
It was our pleasure to provide your ER care today - we hope that you feel better.  Continue prilosec.   From todays labs, your calcium level is slightly high (10.7) - drink plenty of fluids/water, and follow up with primary care doctor. Also follow up with your doctor for your blood pressure, as it is mildly high in the ER tonight.   Although your heart tests look good/normal tonight - for recent chest discomfort, follow up with cardiologist in the next 1-2 weeks - call office to arrange appointment.   Return to ER if worse, new symptoms, recurrent or persistent chest pain, trouble breathing, or other concern.

## 2019-05-27 NOTE — ED Provider Notes (Signed)
[facility] Provider Note   CSN: [id number] Arrival date & time: 05/27/19  1727     History Chief Complaint  Patient presents with  . Chest Pain    Donald Jones is a 74 y.o. male.  Patient c/o mid chest pain for past few days. Symptoms occur at rest, intermittent, last minutes to hours, present all day today, dull, non radiating, feels similar to prior reflux. Denies pleuritic pain. No exertional pain. Pain is located in midline, lower sternal/xiphoid area. No associated nv, diaphoresis or sob. No unusual doe or fatigue.  No hx pud or pancreatitis. Remote hx cholecystectomy. No neck/back pain. Denies fever or chills. No cough or uri symptoms. No leg pain or swelling. No personal or fam hx cad.   The history is provided by the patient.  Chest Pain Associated symptoms: no abdominal pain, no back pain, no cough, no fever, no headache, no nausea, no palpitations, no shortness of breath and no vomiting        Past Medical History:  Diagnosis Date  . Arthritis    hands right  . Diabetes mellitus without complication ([place])   . GERD (gastroesophageal reflux disease)   . Hypertension   . Kidney stones    several episodes.  Last one in 2011.    . Obesity    100-lb weight loss since 2011.   . Polycythemia   . Sleep apnea    on Cpap    Patient Active Problem List   Diagnosis Date Noted  . High cholesterol 01/20/2018  . DJD (degenerative joint disease) 09/06/2017  . Erectile dysfunction 09/06/2017  . PCP NOTES >>> 01/23/2015  . Abnormal TSH 10/05/2014  . Diabetes mellitus without complication ([place]) [id number]  . Obesity (BMI 30-39.9) 12/03/2012  . GERD (gastroesophageal reflux disease) 09/24/2012  . Skin lesion 09/24/2012  . IBS ? 04/22/2012  . Polycythemia   . Annual physical exam 06/05/2011  . Foot drop, right 06/05/2011  . BACK PAIN 02/15/2009  . Obesity 06/14/2006  . HYPERTENSION, BENIGN SYSTEMIC 06/14/2006  . RHINITIS,  ALLERGIC 06/14/2006  . NEPHROLITHIASIS 06/14/2006  . OSA (obstructive sleep apnea) 06/14/2006    Past Surgical History:  Procedure Laterality Date  . CHOLECYSTECTOMY    . HAND SURGERY  2010   R hand , d/t a injury, 3 surgeries        Family History  Problem Relation Age of Onset  . Diabetes Mother   . Coronary artery disease Mother   . Stroke Father        M and F  . Hypertension Father   . Heart disease Sister        ?  Marland Kitchen Alcohol abuse Sister   . Colon cancer Sister 81       age ~ 15  . Prostate cancer Neg Hx   . Stomach cancer Neg Hx   . Rectal cancer Neg Hx   . Esophageal cancer Neg Hx     Social History   Tobacco Use  . Smoking status: Never Smoker  . Smokeless tobacco: Never Used  Substance Use Topics  . Alcohol use: No    Comment: none for 15 years  . Drug use: No    Home Medications Prior to Admission medications   Medication Sig Start Date End Date Taking? Authorizing Provider  acetaminophen (TYLENOL) 325 MG tablet Take 650 mg by mouth every 6 (six) hours as needed for pain.     [provider]  amLODipine (  NORVASC) 10 MG tablet Take 1 tablet (10 mg total) by mouth daily. 01/10/19   Colon Branch, MD  aspirin 81 MG tablet Take 81 mg by mouth daily.    [provider]  atorvastatin (LIPITOR) 40 MG tablet Take 1 tablet (40 mg total) by mouth at bedtime. 03/18/19   Colon Branch, MD  Azelastine HCl 137 MCG/SPRAY SOLN Place 2 sprays into the nose 2 (two) times daily. 07/23/18   Colon Branch, MD  busPIRone (BUSPAR) 7.5 MG tablet Take 1 tablet (7.5 mg total) by mouth 2 (two) times daily. 05/21/19   Saguier, Percell Miller, PA-C  diclofenac sodium (VOLTAREN) 1 % GEL Apply 4 g topically 3 (three) times daily as needed. 05/01/18   Colon Branch, MD  dicyclomine (BENTYL) 20 MG tablet Take 1 tablet (20 mg total) by mouth 3 (three) times daily before meals. 08/19/18   Colon Branch, MD  Flaxseed, Linseed, (FLAXSEED OIL) 1000 MG CAPS Take 2,000 mg by mouth daily.      [provider]  hydrochlorothiazide (HYDRODIURIL) 25 MG tablet Take 1 tablet (25 mg total) by mouth daily. 05/14/19   Colon Branch, MD  hydrocortisone 2.5 % cream Apply topically 2 (two) times daily. 08/21/16   Colon Branch, MD  metFORMIN (GLUCOPHAGE) 500 MG tablet Take 1 tablet (500 mg total) by mouth 2 (two) times daily with a meal. Patient taking differently: Take 500 mg by mouth daily with breakfast.  10/10/18   Colon Branch, MD  Multiple Vitamin (MULTIVITAMIN) tablet Take 1 tablet by mouth daily.     [provider]  omeprazole (PRILOSEC) 40 MG capsule Take 1 capsule (40 mg total) by mouth daily. 08/19/18   Colon Branch, MD  sildenafil (REVATIO) 20 MG tablet TAKE 3 TO 4 TABLETS BY MOUTH AT BEDTIME AS NEEDED. 12/30/18   Colon Branch, MD  sitaGLIPtin (JANUVIA) 100 MG tablet Take 1 tablet (100 mg total) by mouth daily. 04/07/19   Colon Branch, MD    Allergies    Sulfa antibiotics  Review of Systems   Review of Systems  Constitutional: Negative for fever.  HENT: Negative for sore throat.   Eyes: Negative for redness.  Respiratory: Negative for cough and shortness of breath.   Cardiovascular: Positive for chest pain. Negative for palpitations and leg swelling.  Gastrointestinal: Negative for abdominal pain, nausea and vomiting.  Genitourinary: Negative for flank pain.  Musculoskeletal: Negative for back pain and neck pain.  Skin: Negative for rash.  Neurological: Negative for headaches.  Hematological: Does not bruise/bleed easily.  Psychiatric/Behavioral: Negative for confusion.    Physical Exam Updated Vital Signs BP (!) 153/90 (BP Location: Left Arm)   Pulse 67   Temp 98.2 F (36.8 C) (Oral)   Resp 18   SpO2 94%   Physical Exam Vitals and nursing note reviewed.  Constitutional:      Appearance: Normal appearance. He is well-developed.  HENT:     Head: Atraumatic.     Nose: Nose normal.     Mouth/Throat:     Mouth: Mucous membranes are moist.     Pharynx:  Oropharynx is clear.  Eyes:     General: No scleral icterus.    Conjunctiva/sclera: Conjunctivae normal.     Pupils: Pupils are equal, round, and reactive to light.  Neck:     Trachea: No tracheal deviation.  Cardiovascular:     Rate and Rhythm: Normal rate and regular rhythm.  Pulses: Normal pulses.     Heart sounds: Normal heart sounds. No murmur. No friction rub. No gallop.   Pulmonary:     Effort: Pulmonary effort is normal. No accessory muscle usage or respiratory distress.     Breath sounds: Normal breath sounds.  Chest:     Chest wall: No tenderness.  Abdominal:     General: Bowel sounds are normal. There is no distension.     Palpations: Abdomen is soft. There is no mass.     Tenderness: There is no abdominal tenderness. There is no guarding or rebound.     Hernia: No hernia is present.  Genitourinary:    Comments: No cva tenderness. Musculoskeletal:        General: No swelling or tenderness.     Cervical back: Normal range of motion and neck supple. No rigidity.     Right lower leg: No edema.     Left lower leg: No edema.  Skin:    General: Skin is warm and dry.     Findings: No rash.  Neurological:     Mental Status: He is alert.     Comments: Alert, speech clear.   Psychiatric:        Mood and Affect: Mood normal.     ED Results / Procedures / Treatments   Labs (all labs ordered are listed, but only abnormal results are displayed) Results for orders placed or performed during the hospital encounter of 0000000  Basic metabolic panel  Result Value Ref Range   Sodium 137 135 - 145 mmol/L   Potassium 4.0 3.5 - 5.1 mmol/L   Chloride 98 98 - 111 mmol/L   CO2 27 22 - 32 mmol/L   Glucose, Bld 119 (H) 70 - 99 mg/dL   BUN 17 8 - 23 mg/dL   Creatinine, Ser 1.28 (H) 0.61 - 1.24 mg/dL   Calcium 10.7 (H) 8.9 - 10.3 mg/dL   GFR calc non Af Amer 56 (L) >60 mL/min   GFR calc Af Amer >60 >60 mL/min   Anion gap 12 5 - 15  CBC  Result Value Ref Range   WBC 9.3 4.0  - 10.5 K/uL   RBC 5.74 4.22 - 5.81 MIL/uL   Hemoglobin 18.1 (H) 13.0 - 17.0 g/dL   HCT 51.6 39.0 - 52.0 %   MCV 89.9 80.0 - 100.0 fL   MCH 31.5 26.0 - 34.0 pg   MCHC 35.1 30.0 - 36.0 g/dL   RDW 12.6 11.5 - 15.5 %   Platelets 258 150 - 400 K/uL   nRBC 0.0 0.0 - 0.2 %  Troponin I (High Sensitivity)  Result Value Ref Range   Troponin I (High Sensitivity) 6 <18 ng/L  Troponin I (High Sensitivity)  Result Value Ref Range   Troponin I (High Sensitivity) 6 <18 ng/L   DG Chest 2 View  Result Date: 05/27/2019 CLINICAL DATA:  Chest pressure and central burning sensation EXAM: CHEST - 2 VIEW COMPARISON:  09/17/2012 FINDINGS: The heart size and mediastinal contours are within normal limits. Both lungs are clear. The visualized skeletal structures are unremarkable. IMPRESSION: No active cardiopulmonary disease. Electronically Signed   By: Randa Ngo M.D.   On: 05/27/2019 18:24    EKG EKG Interpretation  Date/Time:  Tuesday May 27 2019 17:50:17 EST Ventricular Rate:  93 PR Interval:  146 QRS Duration: 88 QT Interval:  340 QTC Calculation: 422 R Axis:   81 Text Interpretation: Normal sinus rhythm Nonspecific T wave abnormality Confirmed  by Lajean Saver [phone]) on 05/27/2019 10:08:32 PM   Radiology DG Chest 2 View  Result Date: 05/27/2019 CLINICAL DATA:  Chest pressure and central burning sensation EXAM: CHEST - 2 VIEW COMPARISON:  09/17/2012 FINDINGS: The heart size and mediastinal contours are within normal limits. Both lungs are clear. The visualized skeletal structures are unremarkable. IMPRESSION: No active cardiopulmonary disease. Electronically Signed   By: Randa Ngo M.D.   On: 05/27/2019 18:24    Procedures Procedures (including critical care time)  Medications Ordered in ED Medications  sodium chloride flush (NS) 0.9 % injection 3 mL (has no administration in time range)  acetaminophen (TYLENOL) tablet 650 mg (has no administration in time range)  famotidine  (PEPCID) tablet 20 mg (has no administration in time range)  alum & mag hydroxide-simeth (MAALOX/MYLANTA) 200-200-20 MG/5ML suspension 30 mL (has no administration in time range)    ED Course  I have reviewed the triage vital signs and the nursing notes.  Pertinent labs & imaging results that were available during my care of the patient were reviewed by me and considered in my medical decision making (see chart for details).    MDM Rules/Calculators/A&P                      Iv ns. Stat labs. Ecg. Cxr.   Reviewed nursing notes and prior charts for additional history.   Will try pepcid, maalox and acetaminophen for symptom relief - pt notes gi cocktail helped in past when had similar symptoms.   Labs reviewed/interpreted by me - trop is normal. After symptoms present/recurrent x several days, constant for past day, trop normal - symptoms atypical, and felt not c/w ACS.  CXR reviewed/interpreted by me - no pna.   Additional labs reviewed/interpreted by me - delta trop normal.  Patient currently symptom free and appears stable for d/c.   Rec outpt pcp/card f/u.  Return precautions provided.         Final Clinical Impression(s) / ED Diagnoses Final diagnoses:  None    Rx / DC Orders ED Discharge Orders    None       Lajean Saver, MD 05/27/19 [id number]

## 2019-05-27 NOTE — ED Triage Notes (Addendum)
Pt states he has been having left sided cp for a few days. Denies SOB/n/v. Pain goes through to his back

## 2019-05-28 ENCOUNTER — Telehealth: Payer: Self-pay

## 2019-05-28 NOTE — Telephone Encounter (Signed)
Patient spoke with triage nurse for "burning in chest like heartburn". Nurse advised patient to go to ER, patient complied. Advised by ER to f/u with cardiology.    [facility] - Client TELEPHONE ADVICE RECORD AccessNurse Patient Name: Donald Jones Gender: Male DOB: [birth year] Age: 74 Y 3 M 27 D Return Phone Number: [phone] (Primary) Address: City/State/Zip: [place] [address] Client [facility] - Client Client Site [facility] - Day Physician Kathlene November - MD Contact Type Call Who Is Calling Patient / Member / Family / Caregiver Call Type Triage / Clinical Relationship To Patient Self Return Phone Number [phone] (Primary) Chief Complaint Chest Pain (non urgent symptoms) Reason for Call Symptomatic / Request for [place] states having chest pain with burning as in the chest. [place] Not Listed [place] Translation No Nurse Assessment Nurse: Laqueta Due, RN, Metallurgist (Eastern Time): 05/27/2019 4:18:27 PM Confirm and document reason for call. If symptomatic, describe symptoms. ---states having burning in chest like heartburn. has had it on and off since january 26. was seen at [facility] for it. states that antacid seems to help. has had sour taste in mouth Has the patient had close contact with a person known or suspected to have the novel coronavirus illness OR traveled / lives in area with major community spread (including international travel) in the last 14 days from the onset of symptoms? * If Asymptomatic, screen for exposure and travel within the last 14 days. ---No Does the patient have any new or worsening symptoms? ---Yes Will a triage be completed? ---Yes Related visit to physician within the last 2 weeks? ---Yes Does the PT have any chronic conditions? (i.e. diabetes, asthma, this includes High risk factors for pregnancy, etc.) ---Yes List chronic  conditions. ---GERD/acid reflux Is this a behavioral health or substance abuse call? ---No Guidelines Guideline Title Affirmed Question Affirmed Notes Nurse Date/Time (Eastern Time) Chest Pain [1] Chest pain lasts > 5 minutes AND [2] described as crushing, pressure-like, or heavy Laqueta Due, RN, [employer] 05/27/2019 4:20:29 PM PLEASE NOTE: All timestamps contained within this report are represented as [place] Standard Time. CONFIDENTIALTY NOTICE: This fax transmission is intended only for the addressee. It contains information that is legally privileged, confidential or otherwise protected from use or disclosure. If you are not the intended recipient, you are strictly prohibited from reviewing, disclosing, copying using or disseminating any of this information or taking any action in reliance on or regarding this information. If you have received this fax in error, please notify us immediately by telephone so that we can arrange for its return to [place]. Phone: [phone], Toll-Free: [phone], Fax: [phone] Page: 2 of 2 Call Id: [id number] [place]. Time Eilene Ghazi Time) Disposition Final User 05/27/2019 4:24:45 PM 911 Outcome Documentation Whiteley, RN, Amber Reason: pt refuses 911 but will have someone take him to ER. 05/27/2019 4:23:44 PM Call EMS 911 Now Yes Laqueta Due, RN, Agricultural consultant Disagree/Comply Disagree Caller Understands Yes PreDisposition Did not know what to do Care Advice Given Per Guideline CALL EMS 911 NOW: * Immediate medical attention is needed. You need to hang up and call 911 (or an ambulance). * Triager Discretion: I'll call you back in a few minutes to be sure you were able to reach them. CARE ADVICE given per Chest Pain (Adult) guideline. Referrals GO TO FACILITY OTHER - SPECIFY

## 2019-05-28 NOTE — Telephone Encounter (Signed)
Noted, thx.

## 2019-05-29 ENCOUNTER — Other Ambulatory Visit: Payer: Self-pay | Admitting: Internal Medicine

## 2019-05-30 ENCOUNTER — Ambulatory Visit: Payer: BC Managed Care – PPO | Admitting: Physician Assistant

## 2019-05-30 ENCOUNTER — Encounter: Payer: Self-pay | Admitting: Physician Assistant

## 2019-05-30 VITALS — BP 120/70 | HR 61 | Temp 97.4°F | Ht 65.0 in | Wt 209.0 lb

## 2019-05-30 DIAGNOSIS — K219 Gastro-esophageal reflux disease without esophagitis: Secondary | ICD-10-CM

## 2019-05-30 DIAGNOSIS — R0789 Other chest pain: Secondary | ICD-10-CM

## 2019-05-30 MED ORDER — OMEPRAZOLE 40 MG PO CPDR: 40.0000 mg | DELAYED_RELEASE_CAPSULE | Freq: Two times a day (BID) | ORAL | 0 refills | Status: DC

## 2019-05-30 MED ORDER — AMBULATORY NON FORMULARY MEDICATION: 0 refills | Status: DC

## 2019-05-30 NOTE — Progress Notes (Signed)
Chief Complaint: GERD  HPI:    [name] is a 74 year old Caucasian male, known to Dr. Ardis Hughs, with a past medical history as listed below including reflux, obesity and others, who was referred to me by Colon Branch, MD for a complaint of GERD.      09/18/2017 colonoscopy this in the left colon otherwise normal.  Repeat recommended in 5 years.  Due to family history of colon cancer in a first-degree relative less than 40 years old.    04/23/2018 EGD with gastritis and otherwise normal.    05/21/2019 patient saw PCP and discussed reflux.  At that time discussed epigastric burning which was improved with Mylanta.  They also discussed IBS which was mixed with constipation and diarrhea.  At that time recommended restarting daily Prilosec.  He was also recommended that he try 1 tablespoon of Metamucil in 8 ounces of water 3 times a day.  His anxiety was also discussed.    05/27/2019 patient seen in the ER for precordial chest pain.  At that time labs showed a creatinine elevated at 1.28, CBC, troponins and CMP otherwise normal.  Chest x-ray with no active cardiopulmonary disease.  EKG with normal sinus rhythm.  It was felt symptoms were not consistent with ACS.    Today, the patient tells me that he had an increase in reflux/epigastric/chest pain when standing in line to get his first Covid vaccine.  He tells me that he had a panic attack and then started with all the symptoms, it continued after then and he was seen in the urgent care and ER with studies as above.  Tells me that most recently he has been using a generic Mylanta about 4 teaspoons before eating and the burning sensation is easing up.  He also continues on his Omeprazole 40 mg daily which he has been on for "years".  Tells me that overall he feels much better and the pain is only a 2-3/10 at this time.    Denies fever, chills, change in bowel habits, melena, nausea or vomiting.  Past Medical History:  Diagnosis Date  . Arthritis    hands right    . Diabetes mellitus without complication ([place])   . GERD (gastroesophageal reflux disease)   . Hypertension   . Kidney stones    several episodes.  Last one in 2011.    . Obesity    100-lb weight loss since 2011.   . Polycythemia   . Sleep apnea    on Cpap    Past Surgical History:  Procedure Laterality Date  . CHOLECYSTECTOMY    . HAND SURGERY  2010   R hand , d/t a injury, 3 surgeries     Current Outpatient Medications  Medication Sig Dispense Refill  . acetaminophen (TYLENOL) 325 MG tablet Take 650 mg by mouth every 6 (six) hours as needed for pain.     Marland Kitchen amLODipine (NORVASC) 10 MG tablet Take 1 tablet (10 mg total) by mouth daily. 90 tablet 1  . aspirin 81 MG tablet Take 81 mg by mouth daily.    Marland Kitchen atorvastatin (LIPITOR) 40 MG tablet Take 1 tablet (40 mg total) by mouth at bedtime. 90 tablet 1  . Azelastine HCl 137 MCG/SPRAY SOLN Place 2 sprays into the nose 2 (two) times daily. 30 mL 6  . busPIRone (BUSPAR) 7.5 MG tablet Take 1 tablet (7.5 mg total) by mouth 2 (two) times daily. 60 tablet 0  . diclofenac sodium (VOLTAREN) 1 % GEL  Apply 4 g topically 3 (three) times daily as needed. 100 g 3  . dicyclomine (BENTYL) 20 MG tablet Take 1 tablet (20 mg total) by mouth 3 (three) times daily before meals. 270 tablet 3  . Flaxseed, Linseed, (FLAXSEED OIL) 1000 MG CAPS Take 2,000 mg by mouth daily.     . hydrochlorothiazide (HYDRODIURIL) 25 MG tablet Take 1 tablet (25 mg total) by mouth daily. 90 tablet 3  . hydrocortisone 2.5 % cream Apply topically 2 (two) times daily. 60 g 1  . metFORMIN (GLUCOPHAGE) 500 MG tablet Take 1 tablet (500 mg total) by mouth 2 (two) times daily with a meal. (Patient taking differently: Take 500 mg by mouth daily with breakfast. ) 60 tablet 5  . Multiple Vitamin (MULTIVITAMIN) tablet Take 1 tablet by mouth daily.     Marland Kitchen omeprazole (PRILOSEC) 40 MG capsule Take 1 capsule (40 mg total) by mouth daily. 90 capsule 3  . sildenafil (REVATIO) 20 MG tablet TAKE 3 TO  4 TABLETS BY MOUTH AT BEDTIME AS NEEDED. 30 tablet 3  . sitaGLIPtin (JANUVIA) 100 MG tablet Take 1 tablet (100 mg total) by mouth daily. 30 tablet 1   No current facility-administered medications for this visit.    Allergies as of 05/30/2019 - Review Complete 05/27/2019  Allergen Reaction Noted  . Sulfa antibiotics Nausea And Vomiting 02/23/2012    Family History  Problem Relation Age of Onset  . Diabetes Mother   . Coronary artery disease Mother   . Stroke Father        M and F  . Hypertension Father   . Heart disease Sister        ?  Marland Kitchen Alcohol abuse Sister   . Colon cancer Sister 23       age ~ 51  . Prostate cancer Neg Hx   . Stomach cancer Neg Hx   . Rectal cancer Neg Hx   . Esophageal cancer Neg Hx     Social History   Socioeconomic History  . Marital status: Married    Spouse name: Not on file  . Number of children: 3   . Years of education: Not on file  . Highest education level: Not on file  Occupational History  . Occupation: LANDSCAPING  Tobacco Use  . Smoking status: Never Smoker  . Smokeless tobacco: Never Used  Substance and Sexual Activity  . Alcohol use: No    Comment: none for 15 years  . Drug use: No  . Sexual activity: Not Currently  Other Topics Concern  . Not on file  Social History Narrative   1ST WIFE DIED FROM LUNG CANCER, 2ND WIFE DIED FROM RENAL CANCER.     Married x 4 , lives w/ wife   3 sisters and 3 brother : lost 2 sisters    Social Determinants of Radio broadcast assistant Strain:   . Difficulty of Paying Living Expenses: Not on file  Food Insecurity:   . Worried About Charity fundraiser in the Last Year: Not on file  . Ran Out of Food in the Last Year: Not on file  Transportation Needs:   . Lack of Transportation (Medical): Not on file  . Lack of Transportation (Non-Medical): Not on file  Physical Activity:   . Days of Exercise per Week: Not on file  . Minutes of Exercise per Session: Not on file  Stress:   .  Feeling of Stress : Not on file  Social Connections:   .  Frequency of Communication with Friends and Family: Not on file  . Frequency of Social Gatherings with Friends and Family: Not on file  . Attends Religious Services: Not on file  . Active Member of Clubs or Organizations: Not on file  . Attends Archivist Meetings: Not on file  . Marital Status: Not on file  Intimate Partner Violence:   . Fear of Current or Ex-Partner: Not on file  . Emotionally Abused: Not on file  . Physically Abused: Not on file  . Sexually Abused: Not on file    Review of Systems:    Constitutional: No weight loss, fever or chills Cardiovascular: +atypical chest pain Respiratory: No SOB  Gastrointestinal: See HPI and otherwise negative   Physical Exam:  Vital signs: BP 120/70   Pulse 61   Temp (!) 97.4 F (36.3 C)   Ht 5\' 5"  (1.651 m)   Wt 209 lb (94.8 kg)   BMI 34.78 kg/m   Constitutional:   Pleasant obese Caucasian male appears to be in NAD, Well developed, Well nourished, alert and cooperative Respiratory: Respirations even and unlabored. Lungs clear to auscultation bilaterally.   No wheezes, crackles, or rhonchi.  Cardiovascular: Normal S1, S2. No MRG. Regular rate and rhythm. No peripheral edema, cyanosis or pallor.  Gastrointestinal:  Soft, nondistended, nontender. No rebound or guarding. Normal bowel sounds. No appreciable masses or hepatomegaly. Rectal:  Not performed.  Psychiatric: Demonstrates good judgement and reason without abnormal affect or behaviors.  RELEVANT LABS AND IMAGING: CBC    Component Value Date/Time   WBC 9.3 05/27/2019 1758   RBC 5.74 05/27/2019 1758   HGB 18.1 (H) 05/27/2019 1758   HGB 12.3 (L) 10/27/2014 1125   HGB 17.3 (H) 09/01/2013 1429   HCT 51.6 05/27/2019 1758   HCT 38.9 10/27/2014 1125   HCT 49.0 09/01/2013 1429   PLT 258 05/27/2019 1758   PLT 256 10/27/2014 1125   PLT 204 09/01/2013 1429   MCV 89.9 05/27/2019 1758   MCV 76 (L) 10/27/2014  1125   MCV 89.7 09/01/2013 1429   MCH 31.5 05/27/2019 1758   MCHC 35.1 05/27/2019 1758   RDW 12.6 05/27/2019 1758   RDW 16.4 (H) 10/27/2014 1125   RDW 13.0 09/01/2013 1429   LYMPHSABS 2.5 10/10/2018 1520   LYMPHSABS 2.8 10/27/2014 1125   LYMPHSABS 2.1 09/01/2013 1429   MONOABS 0.7 10/10/2018 1520   MONOABS 0.6 09/01/2013 1429   EOSABS 0.1 10/10/2018 1520   EOSABS 0.3 10/27/2014 1125   BASOSABS 0.0 10/10/2018 1520   BASOSABS 0.0 10/27/2014 1125   BASOSABS 0.0 09/01/2013 1429    CMP     Component Value Date/Time   NA 137 05/27/2019 1758   K 4.0 05/27/2019 1758   CL 98 05/27/2019 1758   CO2 27 05/27/2019 1758   GLUCOSE 119 (H) 05/27/2019 1758   BUN 17 05/27/2019 1758   CREATININE 1.28 (H) 05/27/2019 1758   CREATININE 1.21 (H) 01/04/2018 1617   CALCIUM 10.7 (H) 05/27/2019 1758   PROT 6.6 04/17/2019 0952   ALBUMIN 4.3 04/17/2019 0952   AST 24 04/17/2019 0952   ALT 24 04/17/2019 0952   ALKPHOS 59 04/17/2019 0952   BILITOT 1.7 (H) 04/17/2019 0952   GFRNONAA 56 (L) 05/27/2019 1758   GFRAA >60 05/27/2019 1758    Assessment: 1.  GERD: Increase in symptoms after a panic attack; likely related to functional dyspepsia +/- gastritis 2.  Atypical chest pain: Cardiac eval/work-up was negative, likely related to above  Plan: 1.  Discussed with patient likely symptoms are related to anxiety.  It sounds as though he is improving but not quite there yet. 2.  Recommend the patient increase his Omeprazole to 40 mg twice daily, 30-60 minutes before breakfast and dinner.  Prescribed #60 with 0 refills.  Explained to the patient that after finishing this for a month he should decrease back down to once daily dosing.  At that time if he continues with any symptoms he should call and let us know. 3.  Patient also requested to have some GI cocktail on hand.  Prescribed this with no refills. 4.  Patient to follow in clinic with Dr. Ardis Hughs or myself as needed in the future.  Ellouise Newer,  PA-C [facility] 05/30/2019, 8:58 AM  Cc: Colon Branch, MD

## 2019-05-30 NOTE — Patient Instructions (Addendum)
If you are age 72 or older, your body mass index should be between 23-30. Your Body mass index is 34.78 kg/m. If this is out of the aforementioned range listed, please consider follow up with your Primary Care Provider.  If you are age 3 or younger, your body mass index should be between 19-25. Your Body mass index is 34.78 kg/m. If this is out of the aformentioned range listed, please consider follow up with your Primary Care Provider.   We have sent the following medications to your pharmacy for you to pick up at your convenience: Omeprazole 40 mg twice daily for one month, then once daily after that.  GI Cocktail - this has been sent to [facility].  Call if any problems.  Follow up as needed.  Thank you for choosing me and [facility].    Ellouise Newer, PA-C

## 2019-06-01 NOTE — Progress Notes (Signed)
I agree with the above note, plan 

## 2019-06-04 ENCOUNTER — Other Ambulatory Visit: Payer: Self-pay | Admitting: Internal Medicine

## 2019-06-04 ENCOUNTER — Other Ambulatory Visit: Payer: Self-pay

## 2019-06-04 ENCOUNTER — Encounter: Payer: Self-pay | Admitting: Internal Medicine

## 2019-06-04 ENCOUNTER — Ambulatory Visit: Payer: BC Managed Care – PPO | Admitting: Internal Medicine

## 2019-06-04 VITALS — BP 147/56 | HR 72 | Temp 97.1°F | Resp 18 | Ht 65.0 in | Wt 209.4 lb

## 2019-06-04 DIAGNOSIS — F419 Anxiety disorder, unspecified: Secondary | ICD-10-CM | POA: Diagnosis not present

## 2019-06-04 DIAGNOSIS — K219 Gastro-esophageal reflux disease without esophagitis: Secondary | ICD-10-CM

## 2019-06-04 DIAGNOSIS — E119 Type 2 diabetes mellitus without complications: Secondary | ICD-10-CM

## 2019-06-04 MED ORDER — BUSPIRONE HCL 7.5 MG PO TABS: 7.5000 mg | ORAL_TABLET | Freq: Two times a day (BID) | ORAL | 6 refills | Status: DC

## 2019-06-04 NOTE — Progress Notes (Signed)
Pre visit review using our clinic review tool, if applicable. No additional management support is needed unless otherwise documented below in the visit note. 

## 2019-06-04 NOTE — Progress Notes (Signed)
Subjective:    Patient ID: Donald Jones, male    DOB: 07-04-1945, 74 y.o.   MRN: [id number]  DOS:  06/04/2019 Type of visit - description: Follow-up Since the last time I saw him, he had multiple visit to other physicians and the ER.  Chart is reviewed. Symptoms including GERD and anxiety, for the last 24 hours he is finally feeling better: Less anxious, acid reflux has decreased, sleeping well.   Review of Systems Denies nausea, vomiting.  Had diarrhea when she went to the ER, that is resolved. No dysphagia or odynophagia  Past Medical History:  Diagnosis Date  . Arthritis    hands right  . Diabetes mellitus without complication ([place])   . GERD (gastroesophageal reflux disease)   . Hypertension   . Kidney stones    several episodes.  Last one in 2011.    . Obesity    100-lb weight loss since 2011.   . Polycythemia   . Sleep apnea    on Cpap    Past Surgical History:  Procedure Laterality Date  . CHOLECYSTECTOMY    . HAND SURGERY  2010   R hand , d/t a injury, 3 surgeries     Allergies as of 06/04/2019      Reactions   Sulfa Antibiotics Nausea And Vomiting      Medication List       Accurate as of June 04, 2019 11:59 PM. If you have any questions, ask your nurse or doctor.        acetaminophen 325 MG tablet Commonly known as: TYLENOL Take 650 mg by mouth every 6 (six) hours as needed for pain.   AMBULATORY NON FORMULARY MEDICATION GI Cocktail - 90 mls of 2% Lidocaine                      90 mls Dicyclomine 10mg /27ml                    270 mls Maalox Take 5-10 ml every 4-6 hours as needed   amLODipine 10 MG tablet Commonly known as: NORVASC Take 1 tablet (10 mg total) by mouth daily.   aspirin 81 MG tablet Take 81 mg by mouth daily.   atorvastatin 40 MG tablet Commonly known as: LIPITOR Take 1 tablet (40 mg total) by mouth at bedtime.   Azelastine HCl 137 MCG/SPRAY Soln Place 2 sprays into the nose 2 (two) times daily.   busPIRone 7.5 MG  tablet Commonly known as: BUSPAR Take 1 tablet (7.5 mg total) by mouth 2 (two) times daily.   diclofenac sodium 1 % Gel Commonly known as: VOLTAREN Apply 4 g topically 3 (three) times daily as needed.   dicyclomine 20 MG tablet Commonly known as: BENTYL Take 1 tablet (20 mg total) by mouth 3 (three) times daily before meals.   Flaxseed Oil 1000 MG Caps Take 2,000 mg by mouth daily.   hydrochlorothiazide 25 MG tablet Commonly known as: HYDRODIURIL Take 1 tablet (25 mg total) by mouth daily.   hydrocortisone 2.5 % cream Apply topically 2 (two) times daily.   metFORMIN 500 MG tablet Commonly known as: GLUCOPHAGE Take 1 tablet (500 mg total) by mouth 2 (two) times daily with a meal. What changed: when to take this   multivitamin tablet Take 1 tablet by mouth daily.   omeprazole 40 MG capsule Commonly known as: PRILOSEC Take 1 capsule (40 mg total) by mouth in the morning and at bedtime.  What changed: Another medication with the same name was removed. Continue taking this medication, and follow the directions you see here. Changed by: Kathlene November, MD   sildenafil 20 MG tablet Commonly known as: REVATIO TAKE 3 TO 4 TABLETS BY MOUTH AT BEDTIME AS NEEDED.   sitaGLIPtin 100 MG tablet Commonly known as: Januvia Take 1 tablet (100 mg total) by mouth daily.             Objective:   Physical Exam BP (!) 147/56 (BP Location: Left Arm, Patient Position: Sitting, Cuff Size: Normal)   Pulse 72   Temp (!) 97.1 F (36.2 C) (Temporal)   Resp 18   Ht 5\' 5"  (1.651 m)   Wt 209 lb 6 oz (95 kg)   SpO2 97%   BMI 34.84 kg/m  General:   Well developed, NAD, BMI noted. HEENT:  Normocephalic . Face symmetric, atraumatic Lungs:  CTA B Normal respiratory effort, no intercostal retractions, no accessory muscle use. Heart: RRR,  no murmur. Abdomen: Not distended, soft, minimal if any tenderness in the mid abdomen without mass or rebound Lower extremities: no pretibial edema  bilaterally  Skin: Not pale. Not jaundice Neurologic:  alert & oriented X3.  Speech normal, gait appropriate for age and unassisted Psych--  Cognition and judgment appear intact.  Cooperative with normal attention span and concentration.  Behavior appropriate. No anxious or depressed appearing.      Assessment      ASSESSMENT DM (pre-DM until 2016) HTN Polycythemia- sees hematology, likely d/t OSA-diuretics, JAK2 (-) ~ 2014, last OV 10-2014, f/u prn OSA on CPAP DJD- saw Dr Rhona Raider before  R Foot drop  GI: -GERD - IBS? GI sx on and off, previously dx w/ IBS -Cscopes: [id number], 09/18/2017 - EGD 04/2018, showed gastritis, no H. pylori, no malignancy Urolithiasis, several episodes   PLAN: Chart review: -Cardiology visit 04/22/2019: Was seen for chest pain,   resolved. - ER 05/13/2019, he was seen with anxiety, some diarrhea.  Was reassured, no further work-up was needed -ER 05/27/2019, presented with chest pain, BMP showed creatinine 1.2, slightly above baseline, CBC hemoglobin of 18.1 slightly elevated, troponin negative, chest x-ray negative, was given a GI cocktail and subsequently discharge. -GI visit 05/30/2019: They noted increased GERD symptoms and atypical chest pain, they felt one of the problems was anxiety. Omeprazole dose increased primarily. A prescription for a GI cocktail to use as needed provided. == Chest pain: Resolved GERD: Since he saw GI 5 days ago, started taking PPIs twice a day and is finally getting better.  Has a GI cocktail available at home but has not needed it. Rec to continue double PPI, okay to drop to 1 tablet daily next month but if symptoms resurface will be okay to increase back up to twice daily. Anxiety: Symptoms triggered by fear of Covid, he gets very anxious.  Started BuSpar less than a month ago he feels better.  We talk about possibly increase the dose but we agreed on simply continue current meds, RF was sent. DM: Request a refill on Januvia,  sent. RTC 3 months for reassessment   This visit occurred during the SARS-CoV-2 public health emergency.  Safety protocols were in place, including screening questions prior to the visit, additional usage of staff PPE, and extensive cleaning of exam room while observing appropriate contact time as indicated for disinfecting solutions.

## 2019-06-04 NOTE — Telephone Encounter (Signed)
Appt later today- will await labs.

## 2019-06-04 NOTE — Patient Instructions (Addendum)
Please schedule Medicare Wellness with Donald Jones.   Per our records you are due for an eye exam. Please contact your eye doctor to schedule an appointment. Please have them send copies of your office visit notes to [place]. Our fax number is [phone].  GO TO THE FRONT DESK Come back for a check up in 3 months  , please make an appointment

## 2019-06-05 DIAGNOSIS — G4733 Obstructive sleep apnea (adult) (pediatric): Secondary | ICD-10-CM | POA: Diagnosis not present

## 2019-06-05 NOTE — Assessment & Plan Note (Signed)
Chart review: -Cardiology visit 04/22/2019: Was seen for chest pain,   resolved. - ER 05/13/2019, he was seen with anxiety, some diarrhea.  Was reassured, no further work-up was needed -ER 05/27/2019, presented with chest pain, BMP showed creatinine 1.2, slightly above baseline, CBC hemoglobin of 18.1 slightly elevated, troponin negative, chest x-ray negative, was given a GI cocktail and subsequently discharge. -GI visit 05/30/2019: They noted increased GERD symptoms and atypical chest pain, they felt one of the problems was anxiety. Omeprazole dose increased primarily. A prescription for a GI cocktail to use as needed provided. == Chest pain: Resolved GERD: Since he saw GI 5 days ago, started taking PPIs twice a day and is finally getting better.  Has a GI cocktail available at home but has not needed it. Rec to continue double PPI, okay to drop to 1 tablet daily next month but if symptoms resurface will be okay to increase back up to twice daily. Anxiety: Symptoms triggered by fear of Covid, he gets very anxious.  Started BuSpar less than a month ago he feels better.  We talk about possibly increase the dose but we agreed on simply continue current meds, RF was sent. DM: Request a refill on Januvia, sent. RTC 3 months for reassessment

## 2019-07-02 ENCOUNTER — Telehealth: Payer: Self-pay

## 2019-07-02 MED ORDER — OMEPRAZOLE 40 MG PO CPDR: 40.0000 mg | DELAYED_RELEASE_CAPSULE | Freq: Two times a day (BID) | ORAL | 3 refills | Status: DC

## 2019-07-02 NOTE — Telephone Encounter (Signed)
Rx sent 

## 2019-07-02 NOTE — Telephone Encounter (Signed)
Patient called in to see if Dr. [name] could send in a prescription for  omeprazole (PRILOSEC) 40 MG capsule [id number]   Please send it to : [place] [place]  [address] [place] [address]  Phone:  [phone] Fax:  [phone]  DEA #:  --

## 2019-07-03 DIAGNOSIS — G4733 Obstructive sleep apnea (adult) (pediatric): Secondary | ICD-10-CM | POA: Diagnosis not present

## 2019-07-04 ENCOUNTER — Other Ambulatory Visit: Payer: Self-pay

## 2019-07-04 ENCOUNTER — Emergency Department (HOSPITAL_COMMUNITY)
Admission: EM | Admit: 2019-07-04 | Discharge: 2019-07-04 | Disposition: A | Discharge status: Home / Self Care | Payer: BC Managed Care – PPO | Attending: Emergency Medicine | Admitting: Emergency Medicine

## 2019-07-04 ENCOUNTER — Emergency Department (HOSPITAL_COMMUNITY): Payer: BC Managed Care – PPO

## 2019-07-04 ENCOUNTER — Encounter (HOSPITAL_COMMUNITY): Payer: Self-pay | Admitting: Emergency Medicine

## 2019-07-04 DIAGNOSIS — R17 Unspecified jaundice: Secondary | ICD-10-CM

## 2019-07-04 DIAGNOSIS — R1013 Epigastric pain: Secondary | ICD-10-CM

## 2019-07-04 DIAGNOSIS — R7989 Other specified abnormal findings of blood chemistry: Secondary | ICD-10-CM | POA: Diagnosis not present

## 2019-07-04 DIAGNOSIS — R0789 Other chest pain: Secondary | ICD-10-CM | POA: Diagnosis not present

## 2019-07-04 DIAGNOSIS — R079 Chest pain, unspecified: Secondary | ICD-10-CM | POA: Diagnosis not present

## 2019-07-04 LAB — CBC
HCT: 50.2 % (ref 39.0–52.0)
Hemoglobin: 17.2 g/dL — ABNORMAL HIGH (ref 13.0–17.0)
MCH: 31.3 pg (ref 26.0–34.0)
MCHC: 34.3 g/dL (ref 30.0–36.0)
MCV: 91.4 fL (ref 80.0–100.0)
Platelets: 219 10*3/uL (ref 150–400)
RBC: 5.49 MIL/uL (ref 4.22–5.81)
RDW: 12.6 % (ref 11.5–15.5)
WBC: 9.5 10*3/uL (ref 4.0–10.5)
nRBC: 0 % (ref 0.0–0.2)

## 2019-07-04 LAB — URINALYSIS, ROUTINE W REFLEX MICROSCOPIC
Bilirubin Urine: NEGATIVE
Glucose, UA: NEGATIVE mg/dL
Hgb urine dipstick: NEGATIVE
Ketones, ur: NEGATIVE mg/dL
Leukocytes,Ua: NEGATIVE
Nitrite: NEGATIVE
Protein, ur: NEGATIVE mg/dL
Specific Gravity, Urine: 1.008 (ref 1.005–1.030)
pH: 7 (ref 5.0–8.0)

## 2019-07-04 LAB — BASIC METABOLIC PANEL
Anion gap: 12 (ref 5–15)
BUN: 13 mg/dL (ref 8–23)
CO2: 28 mmol/L (ref 22–32)
Calcium: 10.4 mg/dL — ABNORMAL HIGH (ref 8.9–10.3)
Chloride: 97 mmol/L — ABNORMAL LOW (ref 98–111)
Creatinine, Ser: 1.38 mg/dL — ABNORMAL HIGH (ref 0.61–1.24)
GFR calc Af Amer: 59 mL/min — ABNORMAL LOW (ref >60)
GFR calc non Af Amer: 51 mL/min — ABNORMAL LOW (ref >60)
Glucose, Bld: 154 mg/dL — ABNORMAL HIGH (ref 70–99)
Potassium: 4.1 mmol/L (ref 3.5–5.1)
Sodium: 137 mmol/L (ref 135–145)

## 2019-07-04 LAB — HEPATIC FUNCTION PANEL
ALT: 27 U/L (ref 0–44)
AST: 21 U/L (ref 15–41)
Albumin: 4.1 g/dL (ref 3.5–5.0)
Alkaline Phosphatase: 68 U/L (ref 38–126)
Bilirubin, Direct: 0.3 mg/dL — ABNORMAL HIGH (ref 0.0–0.2)
Indirect Bilirubin: 2.4 mg/dL — ABNORMAL HIGH (ref 0.3–0.9)
Total Bilirubin: 2.7 mg/dL — ABNORMAL HIGH (ref 0.3–1.2)
Total Protein: 6.9 g/dL (ref 6.5–8.1)

## 2019-07-04 LAB — LIPASE, BLOOD: Lipase: 33 U/L (ref 11–51)

## 2019-07-04 LAB — CBG MONITORING, ED: Glucose-Capillary: 146 mg/dL — ABNORMAL HIGH (ref 70–99)

## 2019-07-04 LAB — TROPONIN I (HIGH SENSITIVITY)
Troponin I (High Sensitivity): 4 ng/L (ref <18)
Troponin I (High Sensitivity): 5 ng/L (ref <18)

## 2019-07-04 MED ORDER — FAMOTIDINE 20 MG PO TABS: 20.0000 mg | ORAL_TABLET | Freq: Two times a day (BID) | ORAL | 0 refills | Status: DC

## 2019-07-04 MED ORDER — FAMOTIDINE IN NACL 20-0.9 MG/50ML-% IV SOLN
20.0000 mg | Freq: Once | INTRAVENOUS | Status: AC
  Administered 2019-07-04: 20 mg via INTRAVENOUS
  Filled 2019-07-04: qty 50

## 2019-07-04 MED ORDER — ALUM & MAG HYDROXIDE-SIMETH 200-200-20 MG/5ML PO SUSP
30.0000 mL | Freq: Once | ORAL | Status: AC
  Administered 2019-07-04: 30 mL via ORAL
  Filled 2019-07-04: qty 30

## 2019-07-04 MED ORDER — SODIUM CHLORIDE 0.9% FLUSH: 3.0000 mL | Freq: Once | INTRAVENOUS | Status: DC

## 2019-07-04 MED ORDER — LIDOCAINE VISCOUS HCL 2 % MT SOLN
15.0000 mL | Freq: Once | OROMUCOSAL | Status: AC
  Administered 2019-07-04: 08:00:00 15 mL via ORAL
  Filled 2019-07-04: qty 15

## 2019-07-04 NOTE — ED Triage Notes (Signed)
  Patient comes in with chest pain and epigastric pain for the past 3 days.  States pain is in the middle of his chest and goes down to his stomach.  Burning sensation and pressure that goes down and does not radiate to arm or leg.  Patient states he has a extensive hx of GERD and took prilosec and mylanta last night.  Has had gas pains and unable to sleep. Pain 5/10

## 2019-07-04 NOTE — ED Notes (Signed)
Patient verbalizes understanding of discharge instructions. Opportunity for questioning and answers were provided. Pt discharged from ED. 

## 2019-07-04 NOTE — Discharge Instructions (Addendum)
You were seen in the emergency room for chest/upper abdominal pain.  Serious and life-threatening causes of chest pain and abdominal pain have been ruled out.  Your cardiac tests came back normal.  I believe your symptoms are likely due to gastric reflux.  Your total bilirubin and indirect bilirubin were elevated, along with your creatinine.  Make sure to follow-up with your primary care provider for these abnormal tests.  Also please follow-up appointment with your GI doctor.  Take your Prilosec daily, use Mylanta for breakthrough symptoms.  I am adding a medication called Pepcid in addition to your Prilosec.  Take as directed.  I have attached a handout for healthy food choices to prevent reflux flareups.  Please return to the ED if you have chest pain, heart racing, sweating, shortness of breath, getting pain to your back and shoulders or any other concerning symptoms.

## 2019-07-04 NOTE — ED Provider Notes (Signed)
[facility] EMERGENCY DEPARTMENT Provider Note   CSN: [id number] Arrival date & time: 07/04/19  0549     History Chief Complaint  Patient presents with  . Chest Pain  . Abdominal Pain    Donald Jones is a 74 y.o. male.  HPI  74 year old male with a history of hypertension, nephrolithiasis, IBS, GERD, DM type II presents to the ER with epigastric/chest pain which started on Monday.  He states he has been passing lots of gas and has generalized abdominal discomfort.  He states this pain is similar to his reflux pain.  Patient was seen for similar complaint in the ED in February 2021.  He states that this morning it started radiating more into his back and shoulder blades and was keeping him up at night which prompted him to come to the ER.  He has taken Mylanta around 2:30 AM this morning which eased his pain and allowed him to go back to sleep.  He refers that his wife has been out of town and he has been eating hotdogs and frozen meals for the past week.  He had run out of his Prilosec a few weeks ago and he called his primary care provider for prescription earlier this week.  Started taking 2 Prilosec on Monday.  He denies significant cardiac history other than hypertension.  Family history of CAD, stroke, hypertension. The pain waxes and wanes and he rates it a 5 out of 10.  He also notes that he has been urinating more than usual, but denies any pain with urination/hematuria.  Last bowel movement was this morning and normal.  The patient denies fever, cough, shortness of the, palpitations, syncope, swelling of the legs, diaphoresis flank pain, anorexia, nausea or vomiting, dysphagia, change in bowel habits or black or bloody stools or weight loss.     Past Medical History:  Diagnosis Date  . Arthritis    hands right  . Diabetes mellitus without complication ([place])   . GERD (gastroesophageal reflux disease)   . Hypertension   . Kidney stones    several episodes.   Last one in 2011.    . Obesity    100-lb weight loss since 2011.   . Polycythemia   . Sleep apnea    on Cpap    Patient Active Problem List   Diagnosis Date Noted  . High cholesterol 01/20/2018  . DJD (degenerative joint disease) 09/06/2017  . Erectile dysfunction 09/06/2017  . PCP NOTES >>> 01/23/2015  . Abnormal TSH 10/05/2014  . Diabetes mellitus without complication ([place]) [id number]  . Obesity (BMI 30-39.9) 12/03/2012  . GERD (gastroesophageal reflux disease) 09/24/2012  . Skin lesion 09/24/2012  . IBS ? 04/22/2012  . Polycythemia   . Annual physical exam 06/05/2011  . Foot drop, right 06/05/2011  . BACK PAIN 02/15/2009  . HYPERTENSION, BENIGN SYSTEMIC 06/14/2006  . RHINITIS, ALLERGIC 06/14/2006  . NEPHROLITHIASIS 06/14/2006  . OSA (obstructive sleep apnea) 06/14/2006    Past Surgical History:  Procedure Laterality Date  . CHOLECYSTECTOMY    . HAND SURGERY  2010   R hand , d/t a injury, 3 surgeries        Family History  Problem Relation Age of Onset  . Diabetes Mother   . Coronary artery disease Mother   . Stroke Father        M and F  . Hypertension Father   . Heart disease Sister        ?  Marland Kitchen  Alcohol abuse Sister   . Colon cancer Sister 74       age ~ 75  . Prostate cancer Neg Hx   . Stomach cancer Neg Hx   . Rectal cancer Neg Hx   . Esophageal cancer Neg Hx     Social History   Tobacco Use  . Smoking status: Never Smoker  . Smokeless tobacco: Never Used  Substance Use Topics  . Alcohol use: No    Comment: none for 15 years  . Drug use: No    Home Medications Prior to Admission medications   Medication Sig Start Date End Date Taking? Authorizing Provider  acetaminophen (TYLENOL) 500 MG tablet Take 500 mg by mouth every 6 (six) hours as needed for moderate pain.   Yes [provider]  amLODipine (NORVASC) 10 MG tablet Take 1 tablet (10 mg total) by mouth daily. 01/10/19  Yes Colon Branch, MD  aspirin 81 MG tablet Take 81 mg by  mouth daily.   Yes [provider]  atorvastatin (LIPITOR) 40 MG tablet Take 1 tablet (40 mg total) by mouth at bedtime. 03/18/19  Yes Paz, Alda Berthold, MD  Azelastine HCl 137 MCG/SPRAY SOLN Place 2 sprays into the nose 2 (two) times daily. 07/23/18  Yes Paz, Alda Berthold, MD  busPIRone (BUSPAR) 7.5 MG tablet Take 1 tablet (7.5 mg total) by mouth 2 (two) times daily. 06/04/19  Yes Paz, Alda Berthold, MD  diclofenac sodium (VOLTAREN) 1 % GEL Apply 4 g topically 3 (three) times daily as needed. 05/01/18  Yes Paz, Alda Berthold, MD  dicyclomine (BENTYL) 20 MG tablet Take 1 tablet (20 mg total) by mouth 3 (three) times daily before meals. 05/29/19  Yes Paz, Jose E, MD  Flaxseed, Linseed, (FLAXSEED OIL) 1000 MG CAPS Take 2,000 mg by mouth daily.    Yes [provider]  hydrochlorothiazide (HYDRODIURIL) 25 MG tablet Take 1 tablet (25 mg total) by mouth daily. 05/14/19  Yes Paz, Alda Berthold, MD  hydrocortisone 2.5 % cream Apply topically 2 (two) times daily. 08/21/16  Yes Colon Branch, MD  metFORMIN (GLUCOPHAGE) 500 MG tablet Take 1 tablet (500 mg total) by mouth 2 (two) times daily with a meal. 10/10/18  Yes Paz, Alda Berthold, MD  Multiple Vitamin (MULTIVITAMIN) tablet Take 1 tablet by mouth daily.    Yes [provider]  omeprazole (PRILOSEC) 40 MG capsule Take 1 capsule (40 mg total) by mouth 2 (two) times daily before a meal. 07/02/19  Yes Paz, Jacqulyn Bath E, MD  sildenafil (REVATIO) 20 MG tablet TAKE 3 TO 4 TABLETS BY MOUTH AT BEDTIME AS NEEDED. Patient taking differently: Take 60-80 mg by mouth at bedtime as needed.  12/30/18  Yes Paz, Alda Berthold, MD  sitaGLIPtin (JANUVIA) 100 MG tablet Take 1 tablet (100 mg total) by mouth daily. 06/04/19  Yes Colon Branch, MD  AMBULATORY NON FORMULARY MEDICATION GI Cocktail - 90 mls of 2% Lidocaine                      90 mls Dicyclomine 10mg /41ml                    270 mls Maalox Take 5-10 ml every 4-6 hours as needed Patient not taking: Reported on 07/04/2019 05/30/19   Levin Erp, PA      Allergies    Sulfa antibiotics  Review of Systems   Review of Systems  Constitutional: Negative for activity change, appetite  change, chills, diaphoresis, fatigue, fever and unexpected weight change.  HENT: Negative for mouth sores and trouble swallowing.   Respiratory: Negative for cough, choking, chest tightness, shortness of breath and wheezing.   Cardiovascular: Positive for chest pain. Negative for palpitations and leg swelling.  Gastrointestinal: Positive for abdominal pain. Negative for abdominal distention, blood in stool, constipation, diarrhea, nausea and vomiting.  Genitourinary: Positive for frequency. Negative for dysuria.  Musculoskeletal: Negative for arthralgias, back pain, myalgias and neck pain.  Neurological: Negative for dizziness, syncope, weakness and headaches.  All other systems reviewed and are negative.   Physical Exam Updated Vital Signs BP 137/77   Pulse (!) 57   Temp 98.5 F (36.9 C) (Oral)   Resp 12   Ht 5\' 5"  (1.651 m)   Wt 93.4 kg   SpO2 97%   BMI 34.28 kg/m   Physical Exam Constitutional:      Appearance: He is well-developed.  HENT:     Head: Normocephalic and atraumatic.  Eyes:     Extraocular Movements: Extraocular movements intact.  Pulmonary:     Effort: Pulmonary effort is normal.  Abdominal:     Tenderness: There is no right CVA tenderness or left CVA tenderness.  Neurological:     Mental Status: He is alert and oriented to person, place, and time.  Psychiatric:        Mood and Affect: Mood normal.        Behavior: Behavior normal.     ED Results / Procedures / Treatments   Labs (all labs ordered are listed, but only abnormal results are displayed) Labs Reviewed  BASIC METABOLIC PANEL - Abnormal; Notable for the following components:      Result Value   Chloride 97 (*)    Glucose, Bld 154 (*)    Creatinine, Ser 1.38 (*)    Calcium 10.4 (*)    GFR calc non Af Amer 51 (*)    GFR calc Af Amer 59 (*)    All other  components within normal limits  CBC - Abnormal; Notable for the following components:   Hemoglobin 17.2 (*)    All other components within normal limits  HEPATIC FUNCTION PANEL - Abnormal; Notable for the following components:   Total Bilirubin 2.7 (*)    Bilirubin, Direct 0.3 (*)    Indirect Bilirubin 2.4 (*)    All other components within normal limits  CBG MONITORING, ED - Abnormal; Notable for the following components:   Glucose-Capillary 146 (*)    All other components within normal limits  LIPASE, BLOOD  URINALYSIS, ROUTINE W REFLEX MICROSCOPIC  TROPONIN I (HIGH SENSITIVITY)  TROPONIN I (HIGH SENSITIVITY)    EKG EKG Interpretation  Date/Time:  Friday July 04 2019 05:52:50 EDT Ventricular Rate:  68 PR Interval:  150 QRS Duration: 86 QT Interval:  364 QTC Calculation: 387 R Axis:   80 Text Interpretation: Sinus rhythm with Premature atrial complexes Cannot rule out Inferior infarct , age undetermined Abnormal ECG similar to february 2021 Confirmed by Merrily Pew [phone]) on 07/04/2019 6:00:51 AM   Radiology DG Chest 2 View  Result Date: 07/04/2019 CLINICAL DATA:  Chest pain for 3 days EXAM: CHEST - 2 VIEW COMPARISON:  Radiograph 05/27/2019 FINDINGS: No consolidation, features of edema, pneumothorax, or effusion. Pulmonary vascularity is normally distributed. The aorta is calcified. The remaining cardiomediastinal contours are unremarkable. No acute osseous or soft tissue abnormality. IMPRESSION: No acute cardiopulmonary abnormality. Aortic Atherosclerosis (ICD10-I70.0). Electronically Signed   By: Lovena Le  M.D.   On: 07/04/2019 06:17    Procedures Procedures (including critical care time)  Medications Ordered in ED Medications  sodium chloride flush (NS) 0.9 % injection 3 mL (3 mLs Intravenous Not Given 07/04/19 0612)  alum & mag hydroxide-simeth (MAALOX/MYLANTA) 200-200-20 MG/5ML suspension 30 mL (30 mLs Oral Given 07/04/19 0735)    And  lidocaine (XYLOCAINE) 2 %  viscous mouth solution 15 mL (15 mLs Oral Given 07/04/19 0735)  famotidine (PEPCID) IVPB 20 mg premix (0 mg Intravenous Stopped 07/04/19 0803)    ED Course  I have reviewed the triage vital signs and the nursing notes.  Pertinent labs & imaging results that were available during my care of the patient were reviewed by me and considered in my medical decision making (see chart for details).    MDM Rules/Calculators/A&P                      74 year old male with a history of GERD hypertension, nephrolithiasis, IBS, DM type II presents to the ED with chest/epigastric pain x 5 days. -DDX: GERD, ACS, nephrolithiasis,PUD, IBS, SBO/LBO, pancreatitis, aortic dissection,  - Vitals: mildly hypertensive but otherwise unremarkable - EKG sinus rhythm w/ PACs; unchanged from last visit  - CXR: Nml; I do not think abdominal films are indicated at this time as the pt has no nausea/vomitting, passing gas and stool normally  - Labs: elevate creatinine at 1.38, BUN nml.  Last visit creatinine 1.28, appears to be chronically elevated.  Elevated total bilirubin at 2.7, elevated indirect 2.4.  Patient appears to have had elevated total bilirubins at previous visits.  Encouraged to follow-up with PCP over abnormal lab findings. -Symptoms improved with GI cocktail, Pepcid.  Patient resting comfortably in bed - Heart score: 4, delta troponin normal  -Given negative acute chest pain, acute abdominal work-up patient is stable for discharge.  Discussed taking Prilosec on a daily basis, before meals.  He may use Mylanta for breakthrough symptoms.  Will send home with prescription of Pepcid in addition to Prilosec.  Patient's wife states that they have made a follow-up with their GI doctor on the 31st of this month.  MDM Chest pain is not likely of cardiac or pulmonary etiology d/t presentation, PERC negative, VSS, no tracheal deviation, no JVD or new murmur, RRR, breath sounds equal bilaterally, EKG without acute  abnormalities, negative troponin, and negative CXR. Pt has been advised to return to the ED if CP becomes exertional, associated with diaphoresis or nausea, radiates to left jaw/arm, worsens or becomes concerning in any way.  Patient is nontoxic, nonseptic appearing, in no apparent distress.  Patient's pain and other symptoms adequately managed in emergency department. Labs, imaging and vitals reviewed.  Patient does not meet the SIRS or Sepsis criteria.  On repeat exam patient does not have a surgical abdomen and there are no peritoneal signs.  No indication of appendicitis, bowel obstruction, bowel perforation, cholecystitis, diverticulitis.   Patient is to be discharged with recommendation to follow up with PCP and GI in regards to today's hospital visit.  Discussed with patient, he is agreeable to this plan.  Case has been discussed with and seen by Dr. Sherry Ruffing and Benedetto Goad PA-C who agrees with the above plan to discharge.           Final Clinical Impression(s) / ED Diagnoses Final diagnoses:  Chest pain, unspecified type  Epigastric pain  Elevated serum creatinine  Elevated bilirubin    Rx / DC Orders ED  Discharge Orders    None       Lyndel Safe 07/04/19 1000    Tegeler, Gwenyth Allegra, MD 07/04/19 [phone]

## 2019-07-05 ENCOUNTER — Telehealth: Payer: Self-pay | Admitting: Internal Medicine

## 2019-07-05 NOTE — Telephone Encounter (Signed)
Please arrange an ER follow-up

## 2019-07-07 ENCOUNTER — Ambulatory Visit: Payer: BC Managed Care – PPO | Admitting: Internal Medicine

## 2019-07-07 ENCOUNTER — Other Ambulatory Visit: Payer: Self-pay

## 2019-07-07 ENCOUNTER — Encounter: Payer: Self-pay | Admitting: Internal Medicine

## 2019-07-07 VITALS — BP 127/58 | HR 68 | Temp 96.6°F | Resp 18 | Ht 65.0 in | Wt 209.1 lb

## 2019-07-07 DIAGNOSIS — K219 Gastro-esophageal reflux disease without esophagitis: Secondary | ICD-10-CM

## 2019-07-07 DIAGNOSIS — R7989 Other specified abnormal findings of blood chemistry: Secondary | ICD-10-CM

## 2019-07-07 DIAGNOSIS — F419 Anxiety disorder, unspecified: Secondary | ICD-10-CM | POA: Diagnosis not present

## 2019-07-07 LAB — COMPREHENSIVE METABOLIC PANEL
ALT: 25 U/L (ref 0–53)
AST: 22 U/L (ref 0–37)
Albumin: 4.3 g/dL (ref 3.5–5.2)
Alkaline Phosphatase: 80 U/L (ref 39–117)
BUN: 24 mg/dL — ABNORMAL HIGH (ref 6–23)
CO2: 29 mEq/L (ref 19–32)
Calcium: 10.3 mg/dL (ref 8.4–10.5)
Chloride: 98 mEq/L (ref 96–112)
Creatinine, Ser: 1.24 mg/dL (ref 0.40–1.50)
GFR: 57.37 mL/min — ABNORMAL LOW (ref >60.00)
Glucose, Bld: 111 mg/dL — ABNORMAL HIGH (ref 70–99)
Potassium: 3.8 mEq/L (ref 3.5–5.1)
Sodium: 136 mEq/L (ref 135–145)
Total Bilirubin: 1.5 mg/dL — ABNORMAL HIGH (ref 0.2–1.2)
Total Protein: 7.2 g/dL (ref 6.0–8.3)

## 2019-07-07 MED ORDER — BUSPIRONE HCL 15 MG PO TABS: 15.0000 mg | ORAL_TABLET | Freq: Two times a day (BID) | ORAL | 3 refills | Status: DC

## 2019-07-07 NOTE — Assessment & Plan Note (Signed)
GERD: Went to the ER with abdominal and chest discomfort, notes and results reviewed, work-up reassuring except for slight increase creatinine, calcium and bilirubin.  SX felt to be GI in origin.  Plan: Checking a CMP today.  Encourage good hydration and continue with PPIs twice daily and famotidine.   Anxiety: This seems to be still a problem to the patient, started BuSpar by another provider 05/21/2019, has improved a little, no depression, no major problems with insomnia. We will increase buspirone to 15  mg twice daily, Rx sent D/w pt Xanax or Atarax for panic episodes but he declined. RTC 6 weeks.

## 2019-07-07 NOTE — Progress Notes (Signed)
Subjective:    Patient ID: GARO TAMBLYN, male    DOB: 1945-07-22, 74 y.o.   MRN: [id number]  DOS:  07/07/2019 Type of visit - description: ED follow-up Went to the ER 07/04/2019, C/O epigastric/chest discomfort. EKG was no acute Labs: Calcium slightly elevated, creatinine slightly  at 1.38 LFTs okay except for increased bilirubin. CBC okay except for slight increase hemoglobin Troponin lipase negative chest x-ray no acute Treatment: He responded well to a GI cocktail  Review of Systems Since he left the ER he is feeling better. Gas- pressure at the abdomen and chest essentially resolved.  No heartburn. Occasionally feels a slightly dizzy and has some nausea without headache, palpitations.  Also denies diplopia, slurred speech or motor deficit. Anxiety continues to be an issue, denies depression, sleeps okay.   Past Medical History:  Diagnosis Date  . Arthritis    hands right  . Diabetes mellitus without complication ([place])   . GERD (gastroesophageal reflux disease)   . Hypertension   . Kidney stones    several episodes.  Last one in 2011.    . Obesity    100-lb weight loss since 2011.   . Polycythemia   . Sleep apnea    on Cpap    Past Surgical History:  Procedure Laterality Date  . CHOLECYSTECTOMY    . HAND SURGERY  2010   R hand , d/t a injury, 3 surgeries     Allergies as of 07/07/2019      Reactions   Sulfa Antibiotics Nausea And Vomiting      Medication List       Accurate as of July 07, 2019 11:32 AM. If you have any questions, ask your nurse or doctor.        acetaminophen 500 MG tablet Commonly known as: TYLENOL Take 500 mg by mouth every 6 (six) hours as needed for moderate pain.   AMBULATORY NON FORMULARY MEDICATION GI Cocktail - 90 mls of 2% Lidocaine                      90 mls Dicyclomine 10mg /26ml                    270 mls Maalox Take 5-10 ml every 4-6 hours as needed   amLODipine 10 MG tablet Commonly known as: NORVASC Take 1  tablet (10 mg total) by mouth daily.   aspirin 81 MG tablet Take 81 mg by mouth daily.   atorvastatin 40 MG tablet Commonly known as: LIPITOR Take 1 tablet (40 mg total) by mouth at bedtime.   Azelastine HCl 137 MCG/SPRAY Soln Place 2 sprays into the nose 2 (two) times daily.   busPIRone 7.5 MG tablet Commonly known as: BUSPAR Take 1 tablet (7.5 mg total) by mouth 2 (two) times daily.   diclofenac sodium 1 % Gel Commonly known as: VOLTAREN Apply 4 g topically 3 (three) times daily as needed.   dicyclomine 20 MG tablet Commonly known as: BENTYL Take 1 tablet (20 mg total) by mouth 3 (three) times daily before meals.   famotidine 20 MG tablet Commonly known as: PEPCID Take 1 tablet (20 mg total) by mouth 2 (two) times daily.   Flaxseed Oil 1000 MG Caps Take 2,000 mg by mouth daily.   hydrochlorothiazide 25 MG tablet Commonly known as: HYDRODIURIL Take 1 tablet (25 mg total) by mouth daily.   hydrocortisone 2.5 % cream Apply topically 2 (two) times daily.   metFORMIN  500 MG tablet Commonly known as: GLUCOPHAGE Take 1 tablet (500 mg total) by mouth 2 (two) times daily with a meal.   multivitamin tablet Take 1 tablet by mouth daily.   omeprazole 40 MG capsule Commonly known as: PRILOSEC Take 1 capsule (40 mg total) by mouth 2 (two) times daily before a meal.   sildenafil 20 MG tablet Commonly known as: REVATIO TAKE 3 TO 4 TABLETS BY MOUTH AT BEDTIME AS NEEDED. What changed: See the new instructions.   sitaGLIPtin 100 MG tablet Commonly known as: Januvia Take 1 tablet (100 mg total) by mouth daily.          Objective:   Physical Exam BP (!) 127/58 (BP Location: Left Arm, Patient Position: Sitting, Cuff Size: Normal)   Pulse 68   Temp (!) 96.6 F (35.9 C) (Temporal)   Resp 18   Ht 5\' 5"  (1.651 m)   Wt 209 lb 2 oz (94.9 kg)   SpO2 98%   BMI 34.80 kg/m  General:   Well developed, NAD, BMI noted.  HEENT:  Normocephalic . Face symmetric,  atraumatic Lungs:  CTA B Normal respiratory effort, no intercostal retractions, no accessory muscle use. Heart: RRR,  no murmur.  Abdomen:  Not distended, soft, non-tender. No rebound or rigidity.   Skin: Not pale. Not jaundice Lower extremities: no pretibial edema bilaterally  Neurologic:  alert & oriented X3.  Speech normal, gait appropriate for age and unassisted Psych--  Cognition and judgment appear intact.  Cooperative with normal attention span and concentration.  Behavior appropriate. Apprehensive but no depressed appearing.     Assessment     ASSESSMENT DM (pre-DM until 2016) HTN Polycythemia- sees hematology, likely d/t OSA-diuretics, JAK2 (-) ~ 2014, last OV 10-2014, f/u prn OSA on CPAP DJD- saw Dr Rhona Raider before  R Foot drop  GI: -GERD - IBS? GI sx on and off, previously dx w/ IBS -Cscopes: [id number], 09/18/2017 - EGD 04/2018, showed gastritis, no H. pylori, no malignancy Urolithiasis, several episodes   PLAN: GERD: Went to the ER with abdominal and chest discomfort, notes and results reviewed, work-up reassuring except for slight increase creatinine, calcium and bilirubin.  SX felt to be GI in origin.  Plan: Checking a CMP today.  Encourage good hydration and continue with PPIs twice daily and famotidine.   Anxiety: This seems to be still a problem to the patient, started BuSpar by another provider 05/21/2019, has improved a little, no depression, no major problems with insomnia. We will increase buspirone to 15  mg twice daily, Rx sent D/w pt Xanax or Atarax for panic episodes but he declined. RTC 6 weeks.    This visit occurred during the SARS-CoV-2 public health emergency.  Safety protocols were in place, including screening questions prior to the visit, additional usage of staff PPE, and extensive cleaning of exam room while observing appropriate contact time as indicated for disinfecting solutions.

## 2019-07-07 NOTE — Patient Instructions (Addendum)
Per our records you are due for an eye exam. Please contact your eye doctor to schedule an appointment. Please have them send copies of your office visit notes to [place]. Our fax number is [phone].  Increase buspirone to 15 mg twice a day  Other medications the same including omeprazole twice a day and famotidine twice a day to help your stomach.  GO TO THE LAB : Get the blood work    Donald Jones, please reschedule your appointments Come back for    for a checkup in 6 weeks

## 2019-07-07 NOTE — Progress Notes (Signed)
Pre visit review using our clinic review tool, if applicable. No additional management support is needed unless otherwise documented below in the visit note. 

## 2019-07-11 ENCOUNTER — Telehealth: Payer: Self-pay

## 2019-07-11 NOTE — Telephone Encounter (Signed)
Patient called in to see if Dr. [name] or the nurse could give him a call to discuss his lab results please give him a call at [phone]

## 2019-07-11 NOTE — Telephone Encounter (Signed)
Comments to Patient Not seen  Jaquai, your labs came back improved compared to the ER visit. Good results.  Written by Colon Branch, MD on 07/09/2019 3:03 PM EDT   See above- results released to mychart.

## 2019-07-11 NOTE — Telephone Encounter (Signed)
Spoke w/ Pt- informed of results. He needed number to Mychart help desk--given to Pt.

## 2019-07-14 ENCOUNTER — Other Ambulatory Visit: Payer: Self-pay | Admitting: Internal Medicine

## 2019-07-15 ENCOUNTER — Other Ambulatory Visit: Payer: Self-pay | Admitting: Internal Medicine

## 2019-07-16 ENCOUNTER — Encounter: Payer: Self-pay | Admitting: Physician Assistant

## 2019-07-16 ENCOUNTER — Ambulatory Visit: Payer: BC Managed Care – PPO | Admitting: Physician Assistant

## 2019-07-16 VITALS — BP 118/76 | HR 48 | Temp 97.6°F | Ht 65.0 in | Wt 209.0 lb

## 2019-07-16 DIAGNOSIS — R0789 Other chest pain: Secondary | ICD-10-CM

## 2019-07-16 DIAGNOSIS — K219 Gastro-esophageal reflux disease without esophagitis: Secondary | ICD-10-CM

## 2019-07-16 DIAGNOSIS — F411 Generalized anxiety disorder: Secondary | ICD-10-CM | POA: Diagnosis not present

## 2019-07-16 MED ORDER — AMBULATORY NON FORMULARY MEDICATION: 0 refills | Status: DC

## 2019-07-16 NOTE — Progress Notes (Signed)
Chief Complaint: Follow-up atypical chest pain  HPI:    Donald Jones is a pleasant 74 year old male, known to Dr. Ardis Hughs, who returns to clinic today for follow-up of his atypical chest pain.    09/18/2017 colonoscopy this in the left colon otherwise normal.  Repeat recommended in 5 years.  Due to family history of colon cancer in a first-degree relative less than 38 years old.    04/23/2018 EGD with gastritis and otherwise normal.    05/21/2019 patient saw PCP and discussed reflux.  At that time discussed epigastric burning which was improved with Mylanta.  They also discussed IBS which was mixed with constipation and diarrhea.  At that time recommended restarting daily Prilosec.  He was also recommended that he try 1 tablespoon of Metamucil in 8 ounces of water 3 times a day.  His anxiety was also discussed.    05/27/2019 patient seen in the ER for precordial chest pain.  At that time labs showed a creatinine elevated at 1.28, CBC, troponins and CMP otherwise normal.  Chest x-ray with no active cardiopulmonary disease.  EKG with normal sinus rhythm.  It was felt symptoms were not consistent with ACS.      05/30/2019 patient was seen in clinic after being seen in the ER for precordial chest pain.  Cardiac etiology has been ruled out.  At that time patient described that he had symptoms of chest pain which occurred during a panic attack when getting his first Covid vaccine.  At that time discussed that his symptoms are most likely related to anxiety.  His omeprazole was increased to 40 mg twice daily.  Also recommended that he have some GI cocktail on hand.    Since time of last visit patient has been to the ER again 07/04/2019 for this atypical chest pain.  He was started on Pepcid 20 mg twice daily in addition to his omeprazole.    Today, the patient presents clinic and tells me that he is continued with occasional pain which radiates into his chest and sometimes his left shoulder.  Today he explains that if he  burps or passes gas this pain gets better or if he goes outside into the fresh open air and sunshine he feels better.  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  Does tell me that he discussed his anxiety with Dr. [name] at some point and they were thinking about medication but he decided against it in the end.  He has continued his Omeprazole to 40 mg twice daily which he does think is helping with his reflux symptoms.  Never tried GI cocktail.    Denies fever, chills, symptoms that awaken him from sleep, change in bowel habits or blood in the stool.  Past Medical History:  Diagnosis Date  . Arthritis    hands right  . Diabetes mellitus without complication ([place])   . GERD (gastroesophageal reflux disease)   . Hypertension   . Kidney stones    several episodes.  Last one in 2011.    . Obesity    100-lb weight loss since 2011.   . Polycythemia   . Sleep apnea    on Cpap    Past Surgical History:  Procedure Laterality Date  . CHOLECYSTECTOMY    . HAND SURGERY  2010   R hand , d/t a injury,  3 surgeries     Current Outpatient Medications  Medication Sig Dispense Refill  . acetaminophen (TYLENOL) 500 MG tablet Take 500 mg by mouth every 6 (six) hours as needed for moderate pain.    Marland Kitchen amLODipine (NORVASC) 10 MG tablet Take 1 tablet (10 mg total) by mouth daily. 90 tablet 1  . aspirin 81 MG tablet Take 81 mg by mouth daily.    Marland Kitchen atorvastatin (LIPITOR) 40 MG tablet Take 1 tablet (40 mg total) by mouth at bedtime. 90 tablet 1  . Azelastine HCl 137 MCG/SPRAY SOLN Place 2 sprays into the nose 2 (two) times daily. 30 mL 6  . busPIRone (BUSPAR) 15 MG tablet Take 1 tablet (15 mg total) by mouth 2 (two) times daily. 60 tablet 3  . diclofenac sodium (VOLTAREN) 1 % GEL Apply 4 g topically 3 (three) times daily as needed. 100 g 3  . dicyclomine (BENTYL) 20 MG tablet  Take 1 tablet (20 mg total) by mouth 3 (three) times daily before meals. 270 tablet 3  . famotidine (PEPCID) 20 MG tablet Take 1 tablet (20 mg total) by mouth 2 (two) times daily. 30 tablet 0  . Flaxseed, Linseed, (FLAXSEED OIL) 1000 MG CAPS Take 2,000 mg by mouth daily.     . hydrochlorothiazide (HYDRODIURIL) 25 MG tablet Take 1 tablet (25 mg total) by mouth daily. 90 tablet 3  . hydrocortisone 2.5 % cream Apply topically 2 (two) times daily. 60 g 1  . metFORMIN (GLUCOPHAGE) 500 MG tablet Take 1 tablet (500 mg total) by mouth 2 (two) times daily with a meal. 60 tablet 5  . Multiple Vitamin (MULTIVITAMIN) tablet Take 1 tablet by mouth daily.     Marland Kitchen omeprazole (PRILOSEC) 40 MG capsule Take 1 capsule (40 mg total) by mouth 2 (two) times daily before a meal. 180 capsule 3  . sitaGLIPtin (JANUVIA) 100 MG tablet Take 1 tablet (100 mg total) by mouth daily. 30 tablet 5  . AMBULATORY NON FORMULARY MEDICATION GI Cocktail - 90 mls of 2% Lidocaine                      90 mls Dicyclomine 10mg /7ml                    270 mls Maalox Take 5-10 ml every 4-6 hours as needed (Patient not taking: Reported on 07/04/2019) 450 mL 0  . sildenafil (REVATIO) 20 MG tablet TAKE 3 TO 4 TABLETS BY MOUTH AT BEDTIME AS NEEDED. (Patient taking differently: Take 60-80 mg by mouth at bedtime as needed. ) 30 tablet 3   No current facility-administered medications for this visit.    Allergies as of 07/16/2019 - Review Complete 07/16/2019  Allergen Reaction Noted  . Sulfa antibiotics Nausea And Vomiting 02/23/2012    Family History  Problem Relation Age of Onset  . Diabetes Mother   . Coronary artery disease Mother   . Stroke Father        M and F  . Hypertension Father   . Heart disease Sister        ?  Marland Kitchen Alcohol abuse Sister   . Colon cancer Sister 22       age ~ 44  . Prostate cancer Neg Hx   . Stomach cancer Neg Hx   . Rectal cancer Neg Hx   . Esophageal cancer Neg Hx     Social History   Socioeconomic  History  . Marital status: Married  Spouse name: Not on file  . Number of children: 3   . Years of education: Not on file  . Highest education level: Not on file  Occupational History  . Occupation: LANDSCAPING  Tobacco Use  . Smoking status: Never Smoker  . Smokeless tobacco: Never Used  Substance and Sexual Activity  . Alcohol use: No    Comment: none for 15 years  . Drug use: No  . Sexual activity: Not Currently  Other Topics Concern  . Not on file  Social History Narrative   1ST WIFE DIED FROM LUNG CANCER, 2ND WIFE DIED FROM RENAL CANCER.     Married x 4 , lives w/ wife   3 sisters and 3 brother : lost 2 sisters    Social Determinants of Radio broadcast assistant Strain:   . Difficulty of Paying Living Expenses:   Food Insecurity:   . Worried About Charity fundraiser in the Last Year:   . Arboriculturist in the Last Year:   Transportation Needs:   . Film/video editor (Medical):   Marland Kitchen Lack of Transportation (Non-Medical):   Physical Activity:   . Days of Exercise per Week:   . Minutes of Exercise per Session:   Stress:   . Feeling of Stress :   Social Connections:   . Frequency of Communication with Friends and Family:   . Frequency of Social Gatherings with Friends and Family:   . Attends Religious Services:   . Active Member of Clubs or Organizations:   . Attends Archivist Meetings:   Marland Kitchen Marital Status:   Intimate Partner Violence:   . Fear of Current or Ex-Partner:   . Emotionally Abused:   Marland Kitchen Physically Abused:   . Sexually Abused:     Review of Systems:    Constitutional: No weight loss, fever or chills Cardiovascular: No chest pain Respiratory: No SOB  Gastrointestinal: See HPI and otherwise negative Psychiatric: +ANXIETY   Physical Exam:  Vital signs: BP 118/76   Pulse (!) 48   Temp 97.6 F (36.4 C)   Ht 5\' 5"  (1.651 m)   Wt 209 lb (94.8 kg)   SpO2 96%   BMI 34.78 kg/m   Constitutional:   Pleasant obese male appears to  be in NAD, Well developed, Well nourished, alert and cooperative Respiratory: Respirations even and unlabored. Lungs clear to auscultation bilaterally.   No wheezes, crackles, or rhonchi.  Cardiovascular: Normal S1, S2. No MRG. Regular rate and rhythm. No peripheral edema, cyanosis or pallor.  Gastrointestinal:  Soft, nondistended, nontender. No rebound or guarding. Normal bowel sounds. No appreciable masses or hepatomegaly. Rectal:  Not performed.  Psychiatric:  Demonstrates good judgement and reason without abnormal affect or behaviors.  RELEVANT LABS AND IMAGING: CBC    Component Value Date/Time   WBC 9.5 07/04/2019 0601   RBC 5.49 07/04/2019 0601   HGB 17.2 (H) 07/04/2019 0601   HGB 12.3 (L) 10/27/2014 1125   HGB 17.3 (H) 09/01/2013 1429   HCT 50.2 07/04/2019 0601   HCT 38.9 10/27/2014 1125   HCT 49.0 09/01/2013 1429   PLT 219 07/04/2019 0601   PLT 256 10/27/2014 1125   PLT 204 09/01/2013 1429   MCV 91.4 07/04/2019 0601   MCV 76 (L) 10/27/2014 1125   MCV 89.7 09/01/2013 1429   MCH 31.3 07/04/2019 0601   MCHC 34.3 07/04/2019 0601   RDW 12.6 07/04/2019 0601   RDW 16.4 (H) 10/27/2014 1125   RDW  13.0 09/01/2013 1429   LYMPHSABS 2.5 10/10/2018 1520   LYMPHSABS 2.8 10/27/2014 1125   LYMPHSABS 2.1 09/01/2013 1429   MONOABS 0.7 10/10/2018 1520   MONOABS 0.6 09/01/2013 1429   EOSABS 0.1 10/10/2018 1520   EOSABS 0.3 10/27/2014 1125   BASOSABS 0.0 10/10/2018 1520   BASOSABS 0.0 10/27/2014 1125   BASOSABS 0.0 09/01/2013 1429    CMP     Component Value Date/Time   NA 136 07/07/2019 1154   K 3.8 07/07/2019 1154   CL 98 07/07/2019 1154   CO2 29 07/07/2019 1154   GLUCOSE 111 (H) 07/07/2019 1154   BUN 24 (H) 07/07/2019 1154   CREATININE 1.24 07/07/2019 1154   CREATININE 1.21 (H) 01/04/2018 1617   CALCIUM 10.3 07/07/2019 1154   PROT 7.2 07/07/2019 1154   ALBUMIN 4.3 07/07/2019 1154   AST 22 07/07/2019 1154   ALT 25 07/07/2019 1154   ALKPHOS 80 07/07/2019 1154   BILITOT  1.5 (H) 07/07/2019 1154   GFRNONAA 51 (L) 07/04/2019 0601   GFRAA 59 (L) 07/04/2019 0601    Assessment: 1.  GERD: Better with Omeprazole twice daily 2.  Atypical chest pain: Cardiac eval/work-up was negative, most likely related to anxiety 3.  Anxiety  Plan: 1.  Discussed today how I think anxiety is playing a large role in all of his symptoms.  If he were to get on medication for his anxiety then he may be able to stop multiple other medicines which he is taking for symptoms related to this.  Recommend that he re-discuss this with Dr. [name].  He verbalized understanding. 2.  For now continue Omeprazole 40 mg twice daily and Pepcid 20 mg twice daily 3.  Start FD guard 2 tabs twice daily for the next month and then decrease to 1 tab twice daily for a month and then discontinue.  Provided him with samples today. 4.  Recommend the patient start doing activities which make him less stressed such as taking walks outside or doing yoga etc. 5.  Also recommend the patient use his GI cocktail as needed for severe pain 6.  Patient to follow in clinic with Dr. Ardis Hughs or myself as needed in the future.  Ellouise Newer, PA-C [facility] 07/16/2019, 3:33 PM  Cc: Colon Branch, MD

## 2019-07-16 NOTE — Patient Instructions (Addendum)
If you are age 72 or older, your body mass index should be between 23-30. Your Body mass index is 34.78 kg/m. If this is out of the aforementioned range listed, please consider follow up with your Primary Care Provider.  If you are age 39 or younger, your body mass index should be between 19-25. Your Body mass index is 34.78 kg/m. If this is out of the aformentioned range listed, please consider follow up with your Primary Care Provider.   We have sent the following medications to your pharmacy for you to pick up at your convenience: Omeprazole 40 mg twice daily. Pepcid 20 mg twice daily.  GI cocktail refilled.  Please purchase the following medications over the counter and take as directed: Start FD guard 2 tablets twice daily for one month, then decrease to 1 tablet twice daily for 1 month.   Follow up with Dr. [name] about anxiety.

## 2019-07-17 ENCOUNTER — Telehealth: Payer: Self-pay | Admitting: Internal Medicine

## 2019-07-17 NOTE — Telephone Encounter (Signed)
Pt states Dr. [name] told him to call back if he needed medication for anxiety. Pls advise

## 2019-07-17 NOTE — Telephone Encounter (Signed)
Please advise 

## 2019-07-17 NOTE — Progress Notes (Signed)
I agree with the above note, plan 

## 2019-07-18 ENCOUNTER — Other Ambulatory Visit: Payer: Self-pay | Admitting: Internal Medicine

## 2019-07-18 MED ORDER — ALPRAZOLAM 0.5 MG PO TABS: 0.2500 mg | ORAL_TABLET | Freq: Two times a day (BID) | ORAL | 0 refills | Status: DC | PRN

## 2019-07-18 NOTE — Telephone Encounter (Signed)
Was told by GI that anxiety is probably playing a role on his symptoms, I spoke with the patient today, he agrees, would like to take something on as-needed basis for anxiety. Plan: Continue BuSpar, add Xanax 0.5 mg either half or 1 tablet twice a day as needed. 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

## 2019-07-21 ENCOUNTER — Other Ambulatory Visit: Payer: Self-pay

## 2019-07-22 ENCOUNTER — Other Ambulatory Visit: Payer: Self-pay

## 2019-07-22 ENCOUNTER — Ambulatory Visit: Payer: BC Managed Care – PPO | Admitting: Internal Medicine

## 2019-07-22 ENCOUNTER — Encounter: Payer: Self-pay | Admitting: Internal Medicine

## 2019-07-22 VITALS — BP 144/72 | HR 63 | Temp 97.4°F | Resp 16 | Ht 65.0 in | Wt 205.1 lb

## 2019-07-22 DIAGNOSIS — K589 Irritable bowel syndrome without diarrhea: Secondary | ICD-10-CM

## 2019-07-22 DIAGNOSIS — R195 Other fecal abnormalities: Secondary | ICD-10-CM

## 2019-07-22 DIAGNOSIS — F419 Anxiety disorder, unspecified: Secondary | ICD-10-CM

## 2019-07-22 MED ORDER — ESCITALOPRAM OXALATE 10 MG PO TABS: 10.0000 mg | ORAL_TABLET | Freq: Every day | ORAL | 3 refills | Status: DC

## 2019-07-22 NOTE — Addendum Note (Signed)
Addended by: Kelle Darting A on: 07/22/2019 10:08 AM   Modules accepted: Orders

## 2019-07-22 NOTE — Progress Notes (Signed)
Pre visit review using our clinic review tool, if applicable. No additional management support is needed unless otherwise documented below in the visit note. 

## 2019-07-22 NOTE — Progress Notes (Signed)
Subjective:    Patient ID: Donald Jones, male    DOB: 09/01/1945, 74 y.o.   MRN: [id number]  DOS:  07/22/2019 Type of visit - description: Acute visit, to talk about anxiety  The patient continue reporting a number of symptoms including chest burning, lower abdominal cramps. Also has noted some loose stools for the last 2 to 3 days, they are green in color. Denies any fever, some chills. No blood in the stools  As far as anxiety, he is taking BuSpar but he think it is causing nausea and dizziness (however other medications could also cause nausea so I am not sure if BuSpar is the culprit). He would like to change BuSpar.    Review of Systems Denies depression  Past Medical History:  Diagnosis Date  . Arthritis    hands right  . Diabetes mellitus without complication ([place])   . GERD (gastroesophageal reflux disease)   . Hypertension   . Kidney stones    several episodes.  Last one in 2011.    . Obesity    100-lb weight loss since 2011.   . Polycythemia   . Sleep apnea    on Cpap    Past Surgical History:  Procedure Laterality Date  . CHOLECYSTECTOMY    . HAND SURGERY  2010   R hand , d/t a injury, 3 surgeries     Allergies as of 07/22/2019      Reactions   Sulfa Antibiotics Nausea And Vomiting      Medication List       Accurate as of July 22, 2019  9:39 AM. If you have any questions, ask your nurse or doctor.        acetaminophen 500 MG tablet Commonly known as: TYLENOL Take 500 mg by mouth every 6 (six) hours as needed for moderate pain.   ALPRAZolam 0.5 MG tablet Commonly known as: Xanax Take 0.5-1 tablets (0.25-0.5 mg total) by mouth 2 (two) times daily as needed for anxiety.   AMBULATORY NON FORMULARY MEDICATION GI Cocktail - 90 mls of 2% Lidocaine                      90 mls Dicyclomine 10mg /61ml                    270 mls Maalox Take 5-10 ml every 4-6 hours as needed   amLODipine 10 MG tablet Commonly known as: NORVASC Take 1 tablet (10 mg  total) by mouth daily.   aspirin 81 MG tablet Take 81 mg by mouth daily.   atorvastatin 40 MG tablet Commonly known as: LIPITOR Take 1 tablet (40 mg total) by mouth at bedtime.   Azelastine HCl 137 MCG/SPRAY Soln Place 2 sprays into the nose 2 (two) times daily.   busPIRone 15 MG tablet Commonly known as: BUSPAR Take 1 tablet (15 mg total) by mouth 2 (two) times daily.   diclofenac sodium 1 % Gel Commonly known as: VOLTAREN Apply 4 g topically 3 (three) times daily as needed.   dicyclomine 20 MG tablet Commonly known as: BENTYL Take 1 tablet (20 mg total) by mouth 3 (three) times daily before meals.   famotidine 20 MG tablet Commonly known as: PEPCID Take 1 tablet (20 mg total) by mouth 2 (two) times daily before a meal.   FDgard 25-20.75 MG Caps Generic drug: [place] Oil-Levomenthol Take 2 tablets by mouth in the morning and at bedtime.   Flaxseed Oil 1000 MG  Caps Take 2,000 mg by mouth daily.   hydrochlorothiazide 25 MG tablet Commonly known as: HYDRODIURIL Take 1 tablet (25 mg total) by mouth daily.   hydrocortisone 2.5 % cream Apply topically 2 (two) times daily.   metFORMIN 500 MG tablet Commonly known as: GLUCOPHAGE Take 1 tablet (500 mg total) by mouth 2 (two) times daily with a meal.   multivitamin tablet Take 1 tablet by mouth daily.   omeprazole 40 MG capsule Commonly known as: PRILOSEC Take 1 capsule (40 mg total) by mouth 2 (two) times daily before a meal.   sildenafil 20 MG tablet Commonly known as: REVATIO TAKE 3 TO 4 TABLETS BY MOUTH AT BEDTIME AS NEEDED. What changed: See the new instructions.   sitaGLIPtin 100 MG tablet Commonly known as: Januvia Take 1 tablet (100 mg total) by mouth daily.          Objective:   Physical Exam BP (!) 144/72 (BP Location: Left Arm, Patient Position: Sitting, Cuff Size: Normal)   Pulse 63   Temp (!) 97.4 F (36.3 C) (Temporal)   Resp 16   Ht 5\' 5"  (1.651 m)   Wt 205 lb 2 oz (93 kg)   SpO2 97%    BMI 34.13 kg/m  General:   Well developed, NAD, BMI noted. HEENT:  Normocephalic . Face symmetric, atraumatic Skin: Not pale. Not jaundice Neurologic:  alert & oriented X3.  Speech normal, gait appropriate for age and unassisted Psych--  Cognition and judgment appear intact.  Cooperative with normal attention span and concentration.  Behavior appropriate. Very apprehensive and anxious appearing but not depressed appearing.      Assessment     ASSESSMENT DM (pre-DM until 2016) HTN Polycythemia- sees hematology, likely d/t OSA-diuretics, JAK2 (-) ~ 2014, last OV 10-2014, f/u prn OSA on CPAP DJD- saw Dr Rhona Raider before  R Foot drop  GI: -GERD - IBS? GI sx on and off, previously dx w/ IBS -Cscopes: [id number], 09/18/2017 - EGD 04/2018, showed gastritis, no H. pylori, no malignancy Urolithiasis, several episodes   PLAN: GERD, IBS: Continue with a number of GI related symptoms, recently seen by GI, they feel the main driver is anxiety. Currently describing loose stools for few days along with cramps, recommend a bland diet and take Bentyl. Anxiety: He looks anxious today, GI believe that the main driver of the abdominal symptoms is anxiety. Started BuSpar recently, he feels it is causing nausea/dizziness and would like to stop it. Start Lexapro, explained to patient this is a qd med, may cause initially some nausea but it should get better as he keep taking it. I also Rx Xanax last week, has not tried yet, recommend that this is a "quick relief" if he is very anxious.  Watch for drowsiness. He actually is not completely sure why he is anxious, extensive delete listening therapy provided. RTC already scheduled for next month        This visit occurred during the SARS-CoV-2 public health emergency.  Safety protocols were in place, including screening questions prior to the visit, additional usage of staff PPE, and extensive cleaning of exam room while observing appropriate contact  time as indicated for disinfecting solutions.

## 2019-07-22 NOTE — Patient Instructions (Addendum)
Per our records you are due for an eye exam. Please contact your eye doctor to schedule an appointment. Please have them send copies of your office visit notes to [place]. Our fax number is [phone].   Stop BuSpar (buspirone)  Start Escitalopram 10 mg: Take 1 tablet at bedtime, this is your long-term medication.  If you feel very anxious, for quick relief you can take alprazolam or Xanax, either half tablet or 1 tablet, it may make you drowsy so be very careful.  Next visit 08/2019

## 2019-07-23 LAB — GASTROINTESTINAL PATHOGEN PANEL PCR
C. difficile Tox A/B, PCR: NOT DETECTED
Campylobacter, PCR: NOT DETECTED
Cryptosporidium, PCR: NOT DETECTED
E coli (ETEC) LT/ST PCR: NOT DETECTED
E coli (STEC) stx1/stx2, PCR: NOT DETECTED
E coli 0157, PCR: NOT DETECTED
Giardia lamblia, PCR: NOT DETECTED
Norovirus, PCR: NOT DETECTED
Rotavirus A, PCR: NOT DETECTED
Salmonella, PCR: NOT DETECTED
Shigella, PCR: NOT DETECTED

## 2019-07-23 NOTE — Assessment & Plan Note (Signed)
GERD, IBS: Continue with a number of GI related symptoms, recently seen by GI, they feel the main driver is anxiety. Currently describing loose stools for few days along with cramps, recommend a bland diet and take Bentyl. Anxiety: He looks anxious today, GI believe that the main driver of the abdominal symptoms is anxiety. Started BuSpar recently, he feels it is causing nausea/dizziness and would like to stop it. Start Lexapro, explained to patient this is a qd med, may cause initially some nausea but it should get better as he keep taking it. I also Rx Xanax last week, has not tried yet, recommend that this is a "quick relief" if he is very anxious.  Watch for drowsiness. He actually is not completely sure why he is anxious, extensive delete listening therapy provided. RTC already scheduled for next month

## 2019-07-24 ENCOUNTER — Observation Stay (HOSPITAL_COMMUNITY)
Admission: EM | Admit: 2019-07-24 | Discharge: 2019-07-25 | Disposition: A | Discharge status: Home / Self Care | Payer: BC Managed Care – PPO | Attending: Internal Medicine | Admitting: Internal Medicine

## 2019-07-24 ENCOUNTER — Encounter (HOSPITAL_COMMUNITY): Payer: Self-pay

## 2019-07-24 ENCOUNTER — Emergency Department (HOSPITAL_COMMUNITY): Payer: BC Managed Care – PPO

## 2019-07-24 ENCOUNTER — Other Ambulatory Visit: Payer: Self-pay

## 2019-07-24 DIAGNOSIS — R072 Precordial pain: Secondary | ICD-10-CM | POA: Diagnosis not present

## 2019-07-24 DIAGNOSIS — I10 Essential (primary) hypertension: Secondary | ICD-10-CM | POA: Insufficient documentation

## 2019-07-24 DIAGNOSIS — R079 Chest pain, unspecified: Secondary | ICD-10-CM | POA: Diagnosis not present

## 2019-07-24 DIAGNOSIS — R0789 Other chest pain: Secondary | ICD-10-CM

## 2019-07-24 DIAGNOSIS — Z7984 Long term (current) use of oral hypoglycemic drugs: Secondary | ICD-10-CM | POA: Insufficient documentation

## 2019-07-24 DIAGNOSIS — E119 Type 2 diabetes mellitus without complications: Secondary | ICD-10-CM | POA: Insufficient documentation

## 2019-07-24 DIAGNOSIS — Z20822 Contact with and (suspected) exposure to covid-19: Secondary | ICD-10-CM | POA: Diagnosis not present

## 2019-07-24 DIAGNOSIS — K2970 Gastritis, unspecified, without bleeding: Secondary | ICD-10-CM | POA: Diagnosis not present

## 2019-07-24 DIAGNOSIS — Z79899 Other long term (current) drug therapy: Secondary | ICD-10-CM | POA: Diagnosis not present

## 2019-07-24 LAB — TROPONIN I (HIGH SENSITIVITY)
Troponin I (High Sensitivity): 5 ng/L (ref <18)
Troponin I (High Sensitivity): 6 ng/L (ref <18)

## 2019-07-24 LAB — CBC
HCT: 49.4 % (ref 39.0–52.0)
Hemoglobin: 17.3 g/dL — ABNORMAL HIGH (ref 13.0–17.0)
MCH: 31.5 pg (ref 26.0–34.0)
MCHC: 35 g/dL (ref 30.0–36.0)
MCV: 90 fL (ref 80.0–100.0)
Platelets: 305 10*3/uL (ref 150–400)
RBC: 5.49 MIL/uL (ref 4.22–5.81)
RDW: 12.2 % (ref 11.5–15.5)
WBC: 8 10*3/uL (ref 4.0–10.5)
nRBC: 0 % (ref 0.0–0.2)

## 2019-07-24 LAB — BASIC METABOLIC PANEL
Anion gap: 14 (ref 5–15)
BUN: 20 mg/dL (ref 8–23)
CO2: 24 mmol/L (ref 22–32)
Calcium: 10.3 mg/dL (ref 8.9–10.3)
Chloride: 98 mmol/L (ref 98–111)
Creatinine, Ser: 1.36 mg/dL — ABNORMAL HIGH (ref 0.61–1.24)
GFR calc Af Amer: >60 mL/min (ref >60)
GFR calc non Af Amer: 52 mL/min — ABNORMAL LOW (ref >60)
Glucose, Bld: 141 mg/dL — ABNORMAL HIGH (ref 70–99)
Potassium: 3.9 mmol/L (ref 3.5–5.1)
Sodium: 136 mmol/L (ref 135–145)

## 2019-07-24 LAB — CBG MONITORING, ED: Glucose-Capillary: 86 mg/dL (ref 70–99)

## 2019-07-24 MED ORDER — AMLODIPINE BESYLATE 10 MG PO TABS
10.0000 mg | ORAL_TABLET | Freq: Every day | ORAL | Status: DC
  Administered 2019-07-25: 10 mg via ORAL
  Filled 2019-07-24: qty 1

## 2019-07-24 MED ORDER — ASPIRIN 81 MG PO TABS: 81.0000 mg | ORAL_TABLET | Freq: Every day | ORAL | Status: DC

## 2019-07-24 MED ORDER — ASPIRIN EC 81 MG PO TBEC
81.0000 mg | DELAYED_RELEASE_TABLET | Freq: Every day | ORAL | Status: DC
  Administered 2019-07-25: 81 mg via ORAL
  Filled 2019-07-24: qty 1

## 2019-07-24 MED ORDER — ACETAMINOPHEN 325 MG PO TABS: 650.0000 mg | ORAL_TABLET | ORAL | Status: DC | PRN

## 2019-07-24 MED ORDER — NITROGLYCERIN 0.4 MG SL SUBL
0.4000 mg | SUBLINGUAL_TABLET | SUBLINGUAL | Status: DC | PRN
  Administered 2019-07-24: 20:00:00 0.4 mg via SUBLINGUAL
  Filled 2019-07-24 (×2): qty 1

## 2019-07-24 MED ORDER — HEPARIN SODIUM (PORCINE) 5000 UNIT/ML IJ SOLN
5000.0000 [IU] | Freq: Three times a day (TID) | INTRAMUSCULAR | Status: DC
  Administered 2019-07-24 – 2019-07-25 (×2): 5000 [IU] via SUBCUTANEOUS
  Filled 2019-07-24 (×2): qty 1

## 2019-07-24 MED ORDER — INSULIN ASPART 100 UNIT/ML ~~LOC~~ SOLN: 0.0000 [IU] | Freq: Three times a day (TID) | SUBCUTANEOUS | Status: DC

## 2019-07-24 MED ORDER — LACTATED RINGERS IV BOLUS
1000.0000 mL | Freq: Once | INTRAVENOUS | Status: AC
  Administered 2019-07-24: 20:00:00 1000 mL via INTRAVENOUS

## 2019-07-24 MED ORDER — LIDOCAINE-EPINEPHRINE 2 %-1:50000 IJ SOLN: 1.7000 mL | Freq: Once | INTRAMUSCULAR | Status: DC

## 2019-07-24 MED ORDER — ATORVASTATIN CALCIUM 40 MG PO TABS
40.0000 mg | ORAL_TABLET | Freq: Every day | ORAL | Status: DC
  Administered 2019-07-24: 40 mg via ORAL
  Filled 2019-07-24 (×2): qty 1

## 2019-07-24 MED ORDER — ALPRAZOLAM 0.25 MG PO TABS: 0.2500 mg | ORAL_TABLET | Freq: Two times a day (BID) | ORAL | Status: DC | PRN

## 2019-07-24 MED ORDER — ASPIRIN 300 MG RE SUPP: 300.0000 mg | RECTAL | Status: DC

## 2019-07-24 MED ORDER — ASPIRIN 81 MG PO CHEW: 324.0000 mg | CHEWABLE_TABLET | ORAL | Status: DC

## 2019-07-24 MED ORDER — ASPIRIN 81 MG PO CHEW
324.0000 mg | CHEWABLE_TABLET | Freq: Once | ORAL | Status: AC
  Administered 2019-07-24: 324 mg via ORAL
  Filled 2019-07-24: qty 4

## 2019-07-24 MED ORDER — ALUM & MAG HYDROXIDE-SIMETH 200-200-20 MG/5ML PO SUSP
30.0000 mL | Freq: Four times a day (QID) | ORAL | Status: DC | PRN
  Administered 2019-07-24: 30 mL via ORAL
  Filled 2019-07-24: qty 30

## 2019-07-24 MED ORDER — FAMOTIDINE 20 MG PO TABS
20.0000 mg | ORAL_TABLET | Freq: Two times a day (BID) | ORAL | Status: DC
  Administered 2019-07-25 (×2): 20 mg via ORAL
  Filled 2019-07-24 (×2): qty 1

## 2019-07-24 MED ORDER — ESCITALOPRAM OXALATE 10 MG PO TABS
10.0000 mg | ORAL_TABLET | Freq: Every day | ORAL | Status: DC
  Administered 2019-07-24 – 2019-07-25 (×2): 10 mg via ORAL
  Filled 2019-07-24 (×2): qty 1

## 2019-07-24 MED ORDER — ALUM & MAG HYDROXIDE-SIMETH 200-200-20 MG/5ML PO SUSP
15.0000 mL | Freq: Once | ORAL | Status: AC
  Administered 2019-07-24: 15 mL via ORAL
  Filled 2019-07-24: qty 30

## 2019-07-24 MED ORDER — LIDOCAINE VISCOUS HCL 2 % MT SOLN
15.0000 mL | Freq: Four times a day (QID) | OROMUCOSAL | Status: DC | PRN
  Administered 2019-07-24: 23:00:00 15 mL via ORAL
  Filled 2019-07-24: qty 15

## 2019-07-24 MED ORDER — PANTOPRAZOLE SODIUM 40 MG PO TBEC
40.0000 mg | DELAYED_RELEASE_TABLET | Freq: Two times a day (BID) | ORAL | Status: DC
  Administered 2019-07-25 (×2): 40 mg via ORAL
  Filled 2019-07-24 (×2): qty 1

## 2019-07-24 NOTE — H&P (Signed)
Cardiology Admission History and Physical:   Patient ID: Donald ABBATIELLO MRN: [id number]; DOB: 11/15/1945   Admission date: 07/24/2019  Primary Care Provider: Colon Branch, MD Primary Cardiologist: Larae Grooms, MD  Primary Electrophysiologist:  None   Chief Complaint:  Chest pain  Patient Profile:   Donald Jones is a 74 y.o. male with HTN, HLD, OSA, as well as anxiety and history of gastritis who presents with recurrent of atypical chest pain.   History of Present Illness:   [name] presents today after an episode of atypical chest pain. Earlier today he developed a burning substernal discomfort and became anxious. He felt he was hyperventilating during the episode and was mildly diaphoretic, but he feels that these symptoms were more related to his anxiety about the pain than the pain itself. He tells me that he has been having these episodes 3-4 times per week - they often come on after meals or when he feels anxious. Historically they have responded to a GI cocktail, which he is prescribed as an outpatient. He does not develop this pain while walking (which he does frequently for work in his [employer]). Of note, he has had multiple visits in the ER and urgent care for similar symptoms this year, each time with reassuring cardiac evaluation and symptoms improved by GI cocktail.   Of note, he was seen by Dr. Irish Lack in 03/2018 for a similar presentation and underwent an exercise nuclear stress. HR was submaximal at 87%, but perfusion images did not suggest ischemia. As a result, he was also followed closely by GI who performed an endoscopy in 04/2018 with clear evidence of gastritis, but no infection or malignancy on biopsy. He follows with Dr. Ardis Hughs and has been on H2 blockage, PPI, and PRN GI cocktail. Workign diagnosis is an IBS like syndrome. He is also following closely with him PMD for anxiety and has been started on an SSRI.   Upon arrival in the ED, patient was  normotensive, afebrile, with HR 56. Initial EKG with SR, no ischemic changes. Labs with Cr 1.4 (baseline), hsTn 5->6, Hgb 17.3 (baseline given known polycythemia). CXR without acute findings.   At the time of my exam, patient anxious appearing but chest pain free.Denies recent fevers, chills, cough. Has had second dose of COVID vaccine (and had a similar episode of chest pain in this context). Denies LE edema, orthopnea, PND.    Past Medical History:  Diagnosis Date  . Arthritis    hands right  . Diabetes mellitus without complication ([place])   . GERD (gastroesophageal reflux disease)   . Hypertension   . Kidney stones    several episodes.  Last one in 2011.    . Obesity    100-lb weight loss since 2011.   . Polycythemia   . Sleep apnea    on Cpap    Past Surgical History:  Procedure Laterality Date  . CHOLECYSTECTOMY    . HAND SURGERY  2010   R hand , d/t a injury, 3 surgeries      Medications Prior to Admission: Prior to Admission medications   Medication Sig Start Date End Date Taking? Authorizing Provider  acetaminophen (TYLENOL) 500 MG tablet Take 500 mg by mouth every 6 (six) hours as needed for moderate pain.    [provider]  ALPRAZolam Duanne Moron) 0.5 MG tablet Take 0.5-1 tablets (0.25-0.5 mg total) by mouth 2 (two) times daily as needed for anxiety. 07/18/19   Colon Branch, MD  AMBULATORY NON FORMULARY MEDICATION GI Cocktail - 90 mls of 2% Lidocaine                      90 mls Dicyclomine 10mg /12ml                    270 mls Maalox Take 5-10 ml every 4-6 hours as needed Patient not taking: Reported on 07/22/2019 07/16/19   Levin Erp, PA  amLODipine (NORVASC) 10 MG tablet Take 1 tablet (10 mg total) by mouth daily. 07/15/19   Colon Branch, MD  aspirin 81 MG tablet Take 81 mg by mouth daily.    [provider]  atorvastatin (LIPITOR) 40 MG tablet Take 1 tablet (40 mg total) by mouth at bedtime. 03/18/19   Colon Branch, MD  Azelastine HCl 137 MCG/SPRAY  SOLN Place 2 sprays into the nose 2 (two) times daily. 07/23/18   Colon Branch, MD  [place] Oil-Levomenthol (FDGARD) 25-20.75 MG CAPS Take 2 tablets by mouth in the morning and at bedtime.     [provider]  diclofenac sodium (VOLTAREN) 1 % GEL Apply 4 g topically 3 (three) times daily as needed. 05/01/18   Colon Branch, MD  dicyclomine (BENTYL) 20 MG tablet Take 1 tablet (20 mg total) by mouth 3 (three) times daily before meals. 05/29/19   Colon Branch, MD  escitalopram (LEXAPRO) 10 MG tablet Take 1 tablet (10 mg total) by mouth daily. 07/22/19   Colon Branch, MD  famotidine (PEPCID) 20 MG tablet Take 1 tablet (20 mg total) by mouth 2 (two) times daily before a meal. 07/21/19   Paz, Alda Berthold, MD  Flaxseed, Linseed, (FLAXSEED OIL) 1000 MG CAPS Take 2,000 mg by mouth daily.     [provider]  hydrochlorothiazide (HYDRODIURIL) 25 MG tablet Take 1 tablet (25 mg total) by mouth daily. 05/14/19   Colon Branch, MD  hydrocortisone 2.5 % cream Apply topically 2 (two) times daily. 08/21/16   Colon Branch, MD  metFORMIN (GLUCOPHAGE) 500 MG tablet Take 1 tablet (500 mg total) by mouth 2 (two) times daily with a meal. 07/14/19   Colon Branch, MD  Multiple Vitamin (MULTIVITAMIN) tablet Take 1 tablet by mouth daily.     [provider]  omeprazole (PRILOSEC) 40 MG capsule Take 1 capsule (40 mg total) by mouth 2 (two) times daily before a meal. 07/02/19   Paz, Alda Berthold, MD  sildenafil (REVATIO) 20 MG tablet TAKE 3 TO 4 TABLETS BY MOUTH AT BEDTIME AS NEEDED. Patient taking differently: Take 60-80 mg by mouth at bedtime as needed.  12/30/18   Colon Branch, MD  sitaGLIPtin (JANUVIA) 100 MG tablet Take 1 tablet (100 mg total) by mouth daily. 06/04/19   Colon Branch, MD     Allergies:    Allergies  Allergen Reactions  . Sulfa Antibiotics Nausea And Vomiting    Social History:   Social History   Socioeconomic History  . Marital status: Married    Spouse name: Not on file  . Number of children: 3   .  Years of education: Not on file  . Highest education level: Not on file  Occupational History  . Occupation: LANDSCAPING  Tobacco Use  . Smoking status: Never Smoker  . Smokeless tobacco: Never Used  Substance and Sexual Activity  . Alcohol use: No    Comment: none for 15 years  . Drug use: No  .  Sexual activity: Not Currently  Other Topics Concern  . Not on file  Social History Narrative   1ST WIFE DIED FROM LUNG CANCER, 2ND WIFE DIED FROM RENAL CANCER.     Married x 4 , lives w/ wife   3 sisters and 3 brother : lost 2 sisters    Social Determinants of Radio broadcast assistant Strain:   . Difficulty of Paying Living Expenses:   Food Insecurity:   . Worried About Charity fundraiser in the Last Year:   . Arboriculturist in the Last Year:   Transportation Needs:   . Film/video editor (Medical):   Marland Kitchen Lack of Transportation (Non-Medical):   Physical Activity:   . Days of Exercise per Week:   . Minutes of Exercise per Session:   Stress:   . Feeling of Stress :   Social Connections:   . Frequency of Communication with Friends and Family:   . Frequency of Social Gatherings with Friends and Family:   . Attends Religious Services:   . Active Member of Clubs or Organizations:   . Attends Archivist Meetings:   Marland Kitchen Marital Status:   Intimate Partner Violence:   . Fear of Current or Ex-Partner:   . Emotionally Abused:   Marland Kitchen Physically Abused:   . Sexually Abused:     Family History:   The patient's family history includes Alcohol abuse in his sister; Colon cancer (age of onset: 3) in his sister; Coronary artery disease in his mother; Diabetes in his mother; Heart disease in his sister; Hypertension in his father; Stroke in his father. There is no history of Prostate cancer, Stomach cancer, Rectal cancer, or Esophageal cancer.    ROS:  Please see the history of present illness.  All other ROS reviewed and negative.     Physical Exam/Data:   Vitals:    07/24/19 2115 07/24/19 2130 07/24/19 2145 07/24/19 2200  BP: 140/79 136/79 (!) 144/83 (!) 152/82  Pulse: (!) 58 (!) 57 68 (!) 57  Resp: 15 16 20 15   Temp:      TempSrc:      SpO2: 95% 96% 97% 96%  Weight:      Height:       No intake or output data in the 24 hours ending 07/24/19 2212 Last 3 Weights 07/24/2019 07/22/2019 07/16/2019  Weight (lbs) 206 lb 205 lb 2 oz 209 lb  Weight (kg) 93.441 kg 93.044 kg 94.802 kg     Body mass index is 34.28 kg/m.  General:  Well nourished, well developed, in no acute distress HEENT: normal Neck: no JVD Vascular: No carotid bruits; FA pulses 2+ bilaterally without bruits  Cardiac:  normal S1, S2; RRR; no murmur  Lungs:  clear to auscultation bilaterally, no wheezing, rhonchi or rales  Abd: soft. Tender to palpation in epigastric region (pain reproduced).  Ext: no edema Musculoskeletal:  No deformities, BUE and BLE strength normal and equal Skin: warm and dry  Neuro:  CNs 2-12 intact, no focal abnormalities noted Psych:  Normal affect   EKG:  The ECG that was done  was personally reviewed and demonstrates SR with right axis. No ischemic changes.   Relevant CV Studies: Nuclear Exercise Stress 27-Apr-2018:  The left ventricular ejection fraction is normal (55-65%).  Nuclear stress EF: 60%.  Blood pressure demonstrated a normal response to exercise.  The study is normal.  This is a low risk study.   Normal resting and stress perfusion.  No ischemia or infarction EF 60% Baseline ECG with ST changes and artifact during stress so nondiagnostic Patient achieved only 87% PMHR Normal hemodynamic response  Laboratory Data:  High Sensitivity Troponin:   Recent Labs  Lab 07/04/19 0601 07/04/19 0830 07/24/19 1222 07/24/19 1900  TROPONINIHS 4 5 5 6       Chemistry Recent Labs  Lab 07/24/19 1222  NA 136  K 3.9  CL 98  CO2 24  GLUCOSE 141*  BUN 20  CREATININE 1.36*  CALCIUM 10.3  GFRNONAA 52*  GFRAA >60  ANIONGAP 14    No results for  input(s): PROT, ALBUMIN, AST, ALT, ALKPHOS, BILITOT in the last 168 hours. Hematology Recent Labs  Lab 07/24/19 1222  WBC 8.0  RBC 5.49  HGB 17.3*  HCT 49.4  MCV 90.0  MCH 31.5  MCHC 35.0  RDW 12.2  PLT 305   BNPNo results for input(s): BNP, PROBNP in the last 168 hours.  DDimer No results for input(s): DDIMER in the last 168 hours.   Radiology/Studies:  DG Chest 2 View  Result Date: 07/24/2019 CLINICAL DATA:  Chest pain, dyspnea EXAM: CHEST - 2 VIEW COMPARISON:  07/04/2019 chest radiograph. FINDINGS: Stable cardiomediastinal silhouette with normal heart size. No pneumothorax. No pleural effusion. Lungs appear clear, with no acute consolidative airspace disease and no pulmonary edema. IMPRESSION: No active cardiopulmonary disease. Electronically Signed   By: Ilona Sorrel M.D.   On: 07/24/2019 12:25       HEAR Score (for undifferentiated chest pain):  HEAR Score: 5    Assessment and Plan:   1. Atypical Chest Pain -- Continue to favor GI etiology given response to GI cocktail, but also improved with SLN and has CAD risk factors.  -- Will plan for repeat Nuclear Stress given repeated presentations to r/o ischemia. -- Risk stratify with HbA1c and Lipid Panel.  -- Continue home ASA, atorvastatin.   2. Gastritis -- Continued home H2 blocker, PPI -- Has PRN GI cocktail ordered.  -- Refer back to GI if no cardiac etiology identified.  3. HTN -- Continue home amlodipine.  -- Hold HCTZ while inpatient.   4. Diabetes -- Hold metformin, Januvia while in house.  -- FSGs and ISS.   5. OSA -- CPAP ordered.   6. Anxiety -- Continued home Lexapro -- Xanax 0.25 PRN  VTE Prophylaxis with SQH Carb Controlled / Heart Healthy Diet.   Severity of Illness: The appropriate patient status for this patient is OBSERVATION. Observation status is judged to be reasonable and necessary in order to provide the required intensity of service to ensure the patient's safety. The patient's  presenting symptoms, physical exam findings, and initial radiographic and laboratory data in the context of their medical condition is felt to place them at decreased risk for further clinical deterioration. Furthermore, it is anticipated that the patient will be medically stable for discharge from the hospital within 2 midnights of admission. The following factors support the patient status of observation.   " The patient's presenting symptoms include atypical chest pain. " The physical exam findings include n/a " The initial radiographic and laboratory data are reassuring.     For questions or updates, please contact [place] Please consult [email or web address] for contact info under      Signed, Milus Banister, MD  07/24/2019 10:12 PM

## 2019-07-24 NOTE — ED Notes (Signed)
Report given to [place], Therapist, sports. All questions answered completely

## 2019-07-24 NOTE — ED Notes (Signed)
Pt provided with sandwich and water.

## 2019-07-24 NOTE — ED Provider Notes (Signed)
[facility] EMERGENCY DEPARTMENT Provider Note   CSN: [id number] Arrival date & time: 07/24/19  1150     History Chief Complaint  Patient presents with  . Chest Pain  . Shortness of Breath  . Numbness    GABRIEN Jones is a 74 y.o. male.  The history is provided by the patient and the spouse.    HPI: A 87 year old patient with a history of treated diabetes, hypertension, hypercholesterolemia and obesity presents for evaluation of chest pain. Initial onset of pain was approximately 3-6 hours ago. The patient's chest pain is described as heaviness/pressure/tightness and is worse with exertion. The patient complains of nausea and reports some diaphoresis. The patient's chest pain is middle- or left-sided, is not well-localized, is not sharp and does radiate to the arms/jaw/neck. The patient has no history of stroke, has no history of peripheral artery disease, has not smoked in the past 90 days and has no relevant family history of coronary artery disease (first degree relative at less than age 65). 74 year old male with pmh of HTN, DM, HLD, presented to the emerge department accompanied by his wife complaining of central sternal non radiating heaviness worse with exertion, DOE.  He also notes having intermittent diaphoresis, left jaw pain/tightness and associated nausea. No recent trips, flights, surgeries, LE swelling or pain, h/o CA, h/o blood clots. No lightheadedness. Non smoker. No F/C cough congestion rhinorrhea, abd pain, changes in bowel or bladder function.  Patient notes that he is fully covid vaccinated.  He works as a Designer, industrial/product.  Past Medical History:  Diagnosis Date  . Arthritis    hands right  . Diabetes mellitus without complication ([place])   . GERD (gastroesophageal reflux disease)   . Hypertension   . Kidney stones    several episodes.  Last one in 2011.    . Obesity    100-lb weight loss since 2011.   . Polycythemia   . Sleep apnea    on  Cpap    Patient Active Problem List   Diagnosis Date Noted  . Anxiety 07/07/2019  . High cholesterol 01/20/2018  . DJD (degenerative joint disease) 09/06/2017  . Erectile dysfunction 09/06/2017  . PCP NOTES >>> 01/23/2015  . Abnormal TSH 10/05/2014  . Diabetes mellitus without complication ([place]) [id number]  . Obesity (BMI 30-39.9) 12/03/2012  . GERD (gastroesophageal reflux disease) 09/24/2012  . Skin lesion 09/24/2012  . IBS ? 04/22/2012  . Polycythemia   . Annual physical exam 06/05/2011  . Foot drop, right 06/05/2011  . BACK PAIN 02/15/2009  . HYPERTENSION, BENIGN SYSTEMIC 06/14/2006  . RHINITIS, ALLERGIC 06/14/2006  . NEPHROLITHIASIS 06/14/2006  . OSA (obstructive sleep apnea) 06/14/2006    Past Surgical History:  Procedure Laterality Date  . CHOLECYSTECTOMY    . HAND SURGERY  2010   R hand , d/t a injury, 3 surgeries        Family History  Problem Relation Age of Onset  . Diabetes Mother   . Coronary artery disease Mother   . Stroke Father        M and F  . Hypertension Father   . Heart disease Sister        ?  Marland Kitchen Alcohol abuse Sister   . Colon cancer Sister 2       age ~ 81  . Prostate cancer Neg Hx   . Stomach cancer Neg Hx   . Rectal cancer Neg Hx   . Esophageal cancer Neg  Hx     Social History   Tobacco Use  . Smoking status: Never Smoker  . Smokeless tobacco: Never Used  Substance Use Topics  . Alcohol use: No    Comment: none for 15 years  . Drug use: No    Home Medications Prior to Admission medications   Medication Sig Start Date End Date Taking? Authorizing Provider  acetaminophen (TYLENOL) 500 MG tablet Take 500 mg by mouth every 6 (six) hours as needed for moderate pain.    [provider]  ALPRAZolam Duanne Moron) 0.5 MG tablet Take 0.5-1 tablets (0.25-0.5 mg total) by mouth 2 (two) times daily as needed for anxiety. 07/18/19   Colon Branch, MD  AMBULATORY NON FORMULARY MEDICATION GI Cocktail - 90 mls of 2% Lidocaine                       90 mls Dicyclomine 10mg /23ml                    270 mls Maalox Take 5-10 ml every 4-6 hours as needed Patient not taking: Reported on 07/22/2019 07/16/19   Levin Erp, PA  amLODipine (NORVASC) 10 MG tablet Take 1 tablet (10 mg total) by mouth daily. 07/15/19   Colon Branch, MD  aspirin 81 MG tablet Take 81 mg by mouth daily.    [provider]  atorvastatin (LIPITOR) 40 MG tablet Take 1 tablet (40 mg total) by mouth at bedtime. 03/18/19   Colon Branch, MD  Azelastine HCl 137 MCG/SPRAY SOLN Place 2 sprays into the nose 2 (two) times daily. 07/23/18   Colon Branch, MD  [place] Oil-Levomenthol (FDGARD) 25-20.75 MG CAPS Take 2 tablets by mouth in the morning and at bedtime.     [provider]  diclofenac sodium (VOLTAREN) 1 % GEL Apply 4 g topically 3 (three) times daily as needed. 05/01/18   Colon Branch, MD  dicyclomine (BENTYL) 20 MG tablet Take 1 tablet (20 mg total) by mouth 3 (three) times daily before meals. 05/29/19   Colon Branch, MD  escitalopram (LEXAPRO) 10 MG tablet Take 1 tablet (10 mg total) by mouth daily. 07/22/19   Colon Branch, MD  famotidine (PEPCID) 20 MG tablet Take 1 tablet (20 mg total) by mouth 2 (two) times daily before a meal. 07/21/19   Paz, Alda Berthold, MD  Flaxseed, Linseed, (FLAXSEED OIL) 1000 MG CAPS Take 2,000 mg by mouth daily.     [provider]  hydrochlorothiazide (HYDRODIURIL) 25 MG tablet Take 1 tablet (25 mg total) by mouth daily. 05/14/19   Colon Branch, MD  hydrocortisone 2.5 % cream Apply topically 2 (two) times daily. 08/21/16   Colon Branch, MD  metFORMIN (GLUCOPHAGE) 500 MG tablet Take 1 tablet (500 mg total) by mouth 2 (two) times daily with a meal. 07/14/19   Colon Branch, MD  Multiple Vitamin (MULTIVITAMIN) tablet Take 1 tablet by mouth daily.     [provider]  omeprazole (PRILOSEC) 40 MG capsule Take 1 capsule (40 mg total) by mouth 2 (two) times daily before a meal. 07/02/19   Paz, Alda Berthold, MD  sildenafil (REVATIO) 20 MG  tablet TAKE 3 TO 4 TABLETS BY MOUTH AT BEDTIME AS NEEDED. Patient taking differently: Take 60-80 mg by mouth at bedtime as needed.  12/30/18   Colon Branch, MD  sitaGLIPtin (JANUVIA) 100 MG tablet Take 1 tablet (100 mg total) by mouth  daily. 06/04/19   Colon Branch, MD    Allergies    Sulfa antibiotics  Review of Systems   Review of Systems  Constitutional: Positive for diaphoresis. Negative for activity change, appetite change, chills, fatigue and fever.  HENT: Negative for congestion and rhinorrhea.   Respiratory: Positive for shortness of breath. Negative for cough and wheezing.   Cardiovascular: Positive for chest pain. Negative for palpitations and leg swelling.  Gastrointestinal: Positive for nausea. Negative for abdominal distention, abdominal pain, diarrhea and vomiting.  Genitourinary: Negative for decreased urine volume, difficulty urinating, dysuria, frequency and urgency.  Musculoskeletal: Positive for neck pain. Negative for gait problem and neck stiffness.  Skin: Negative for color change and wound.  Neurological: Negative for dizziness, syncope, weakness, light-headedness and headaches.  All other systems reviewed and are negative.   Physical Exam Updated Vital Signs BP 137/79   Pulse (!) 56   Temp 98.1 F (36.7 C) (Oral)   Resp 12   Ht 5\' 5"  (1.651 m)   Wt 93.4 kg   SpO2 96%   BMI 34.28 kg/m   Physical Exam Vitals and nursing note reviewed.  Constitutional:      General: He is not in acute distress.    Appearance: Normal appearance. He is normal weight. He is not ill-appearing.  HENT:     Head: Normocephalic.     Right Ear: External ear normal.     Left Ear: External ear normal.     Nose: Nose normal.     Mouth/Throat:     Mouth: Mucous membranes are moist.     Pharynx: Oropharynx is clear.  Eyes:     Extraocular Movements: Extraocular movements intact.  Neck:     Vascular: No JVD.  Cardiovascular:     Rate and Rhythm: Normal rate and regular rhythm.       Pulses: Normal pulses.     Heart sounds: Normal heart sounds.  Pulmonary:     Effort: Pulmonary effort is normal. No respiratory distress.     Breath sounds: Normal breath sounds. No decreased breath sounds, wheezing or rhonchi.  Abdominal:     General: Bowel sounds are normal.     Palpations: Abdomen is soft.     Tenderness: There is no abdominal tenderness. There is no guarding.  Musculoskeletal:     Cervical back: Normal range of motion.     Right lower leg: Edema present.     Left lower leg: Edema present.     Comments: 1+ pitting edema to the bilateral ankles  Skin:    General: Skin is warm and dry.     Capillary Refill: Capillary refill takes less than 2 seconds.  Neurological:     General: No focal deficit present.     Mental Status: He is alert and oriented to person, place, and time. Mental status is at baseline.  Psychiatric:        Mood and Affect: Mood normal.     ED Results / Procedures / Treatments   Labs (all labs ordered are listed, but only abnormal results are displayed) Labs Reviewed  BASIC METABOLIC PANEL - Abnormal; Notable for the following components:      Result Value   Glucose, Bld 141 (*)    Creatinine, Ser 1.36 (*)    GFR calc non Af Amer 52 (*)    All other components within normal limits  CBC - Abnormal; Notable for the following components:   Hemoglobin 17.3 (*)    All  other components within normal limits  SARS CORONAVIRUS 2 (TAT 6-24 HRS)  TROPONIN I (HIGH SENSITIVITY)  TROPONIN I (HIGH SENSITIVITY)    EKG EKG Interpretation  Date/Time:  Thursday July 24 2019 11:52:26 EDT Ventricular Rate:  73 PR Interval:  152 QRS Duration: 86 QT Interval:  368 QTC Calculation: 405 R Axis:   104 Text Interpretation: Normal sinus rhythm Rightward axis Nonspecific ST abnormality Abnormal ECG No significant change since last tracing Confirmed by Wandra Arthurs [phone]) on 07/24/2019 5:50:01 PM   Radiology DG Chest 2 View  Result Date:  07/24/2019 CLINICAL DATA:  Chest pain, dyspnea EXAM: CHEST - 2 VIEW COMPARISON:  07/04/2019 chest radiograph. FINDINGS: Stable cardiomediastinal silhouette with normal heart size. No pneumothorax. No pleural effusion. Lungs appear clear, with no acute consolidative airspace disease and no pulmonary edema. IMPRESSION: No active cardiopulmonary disease. Electronically Signed   By: Ilona Sorrel M.D.   On: 07/24/2019 12:25    Procedures Procedures (including critical care time)  Medications Ordered in ED Medications  nitroGLYCERIN (NITROSTAT) SL tablet 0.4 mg (0.4 mg Sublingual Given 07/24/19 1955)  lactated ringers bolus 1,000 mL (1,000 mLs Intravenous New Bag/Given 07/24/19 1957)  aspirin chewable tablet 324 mg (324 mg Oral Given 07/24/19 1952)  alum & mag hydroxide-simeth (MAALOX/MYLANTA) 200-200-20 MG/5ML suspension 15 mL (15 mLs Oral Given 07/24/19 1955)    ED Course  I have reviewed the triage vital signs and the nursing notes.  Pertinent labs & imaging results that were available during my care of the patient were reviewed by me and considered in my medical decision making (see chart for details).    MDM Rules/Calculators/A&P HEAR Score: 7                    Upon my evaluation patient patient endorses typical chest pain today.Patient did not receive 325 mg ASA prior to arrival therefore I did provide ASA.  Symptomatic treatment with nitroglycerin was required. I explicitly expressed my concerns, DDX, and MDM at the patient's bedside.   EKG I obtained reveals no anatomical ischemia representing STEMI, though has new T inversions in the lateral leads concerning for ischemia, no New-onset Arrhythmia, or STMEI equivalent. Therefore do not suspect ACS at this time. No concerns for Pericardial Tamponade on EKG and in light of patients hemodynamic stability. No pain related to supine or prone positions and given EKG doubt Pericarditis.   Per the patient's absence of CAD will send Troponin. First of  which is negative, Repeat troponin at 3 hour interval was also negative. Therefore also do not suspect Myocarditis.   CXR unremarkable for focal airspace disease, patient is afebrile,no cough, and WBC shows no leukocytosis, do not suspect Pneumonia. Without evidence of Pneumothorax. CXR without concern for Esophageal Tear and there is no recent intractable emesis or esophageal instrumentation. No peritonitis or free air on CXR worrisome for Perforated Abdominal Viscous.  Unlikely Pulmonary Embolism as patient denies estrogen supplementation. Denies malignancy with treatment in last 6 months.No previous Hx of DVT/PE. Patient denies hemoptysis. No unilateral leg swelling observed on exam. Oxygenation saturation has been maintained >95% & HR has been <100 since since arrival to the ED. No recent surgery or trauma to lower extremities or travel involving prolonged car or plane ride. Therefore will not obtain CTA Chest or D-dimer.  Pain is not described as tearing and does not radiate to back, doubt Aortic Dissection. Pulses present bilaterally in upper and lower extremities. CXR does not show widened mediastinum.  At bedside, detailed discussion had stating that during an ED evaluation we cannot rule-out or guarantee with a high degree of certainty that the symptoms do not represent something serious such as a heart attack or some other serious medical condition.   Patient was appropriately risk stratified as HEAR score of 7 and will require continued observation care and subsequent cardiac stress testing for further indications of underlying pathology.  Consult placed to cardiology who will admit the patient to their service with concern for unstable angina.  The plan for this patient was discussed with Dr. Laverta Baltimore, who voiced agreement and who oversaw evaluation and treatment of this patient.  Final Clinical Impression(s) / ED Diagnoses Final diagnoses:  Precordial chest pain    Rx / DC Orders ED  Discharge Orders    None       Filbert Berthold, MD 07/24/19 2118    Margette Fast, MD 07/25/19 [phone]

## 2019-07-24 NOTE — ED Triage Notes (Signed)
Pt reports chest pain, sob and bilateral leg numbness that started today. Pt a.o, resp e.u at this time. States he was seen for the same last month.

## 2019-07-24 NOTE — ED Notes (Signed)
Cardiology at bedside.

## 2019-07-24 NOTE — Progress Notes (Signed)
Auto CPAP set up for pt (Min 5-Max 20) with ffm. Pt not ready to wear at this time. Advised pt to notify for RT or RN if he needs assistance with mask. RT will continue to monitor.

## 2019-07-24 NOTE — ED Notes (Signed)
All meds given per [facility]. Name/DOB verified with pt Urinal provided, able to void 250 cc of urine

## 2019-07-24 NOTE — ED Notes (Addendum)
Assumed care of pt. Pt a&ox4. Pt reports burning to central chest and the left side of his abdomen.  Pt states pain radiates to his jaw. Endorses that he woke up with decreased appetite and some nausea. Denies lightheadedness. Denies diarrhea presently, but reports having it last week.  PIV initiated, 20 G to RAC. IV flushes with 10 cc NS without s/s of infiltration. Repeat troponin drawn, labeled with 2 pt identifiers, and sent

## 2019-07-25 ENCOUNTER — Observation Stay (HOSPITAL_COMMUNITY): Payer: BC Managed Care – PPO

## 2019-07-25 ENCOUNTER — Observation Stay (HOSPITAL_BASED_OUTPATIENT_CLINIC_OR_DEPARTMENT_OTHER): Payer: BC Managed Care – PPO

## 2019-07-25 DIAGNOSIS — R072 Precordial pain: Secondary | ICD-10-CM | POA: Diagnosis not present

## 2019-07-25 DIAGNOSIS — E119 Type 2 diabetes mellitus without complications: Secondary | ICD-10-CM | POA: Diagnosis not present

## 2019-07-25 DIAGNOSIS — R079 Chest pain, unspecified: Secondary | ICD-10-CM | POA: Diagnosis not present

## 2019-07-25 DIAGNOSIS — Z20822 Contact with and (suspected) exposure to covid-19: Secondary | ICD-10-CM | POA: Diagnosis not present

## 2019-07-25 DIAGNOSIS — R0789 Other chest pain: Secondary | ICD-10-CM

## 2019-07-25 DIAGNOSIS — I10 Essential (primary) hypertension: Secondary | ICD-10-CM | POA: Diagnosis not present

## 2019-07-25 LAB — ECHOCARDIOGRAM COMPLETE
Height: 65 in
Weight: 3296 oz

## 2019-07-25 LAB — BASIC METABOLIC PANEL
Anion gap: 10 (ref 5–15)
BUN: 17 mg/dL (ref 8–23)
CO2: 27 mmol/L (ref 22–32)
Calcium: 9.6 mg/dL (ref 8.9–10.3)
Chloride: 100 mmol/L (ref 98–111)
Creatinine, Ser: 1.33 mg/dL — ABNORMAL HIGH (ref 0.61–1.24)
GFR calc Af Amer: >60 mL/min (ref >60)
GFR calc non Af Amer: 53 mL/min — ABNORMAL LOW (ref >60)
Glucose, Bld: 113 mg/dL — ABNORMAL HIGH (ref 70–99)
Potassium: 3.6 mmol/L (ref 3.5–5.1)
Sodium: 137 mmol/L (ref 135–145)

## 2019-07-25 LAB — SARS CORONAVIRUS 2 (TAT 6-24 HRS): SARS Coronavirus 2: NEGATIVE

## 2019-07-25 LAB — LIPID PANEL
Cholesterol: 85 mg/dL (ref 0–200)
HDL: 41 mg/dL (ref >40)
LDL Cholesterol: 38 mg/dL (ref 0–99)
Total CHOL/HDL Ratio: 2.1 RATIO
Triglycerides: 28 mg/dL (ref <150)
VLDL: 6 mg/dL (ref 0–40)

## 2019-07-25 LAB — GLUCOSE, CAPILLARY
Glucose-Capillary: 113 mg/dL — ABNORMAL HIGH (ref 70–99)
Glucose-Capillary: 137 mg/dL — ABNORMAL HIGH (ref 70–99)
Glucose-Capillary: 96 mg/dL (ref 70–99)

## 2019-07-25 MED ORDER — AMBULATORY NON FORMULARY MEDICATION: 0 refills | Status: DC

## 2019-07-25 NOTE — Discharge Summary (Signed)
Discharge Summary    Patient ID: Donald Jones MRN: [id number]; DOB: 02-Feb-1946  Admit date: 07/24/2019 Discharge date: 07/25/2019  Primary Care Provider: Colon Branch, MD  Primary Cardiologist: Larae Grooms, MD  Primary Electrophysiologist:  None   Discharge Diagnoses    Active Problems:   Atypical chest pain   Allergies Allergies  Allergen Reactions  . Sulfa Antibiotics Nausea And Vomiting    Diagnostic Studies/Procedures    ECHO: 07/25/2019 1. Left ventricular ejection fraction, by estimation, is 60 to 65%. The  left ventricle has normal function. The left ventricle has no regional  wall motion abnormalities. Left ventricular diastolic parameters were  normal.  2. Right ventricular systolic function is normal. The right ventricular  size is normal. Tricuspid regurgitation signal is inadequate for assessing  PA pressure.  3. The mitral valve is normal in structure. No evidence of mitral valve  regurgitation. No evidence of mitral stenosis.  4. The aortic valve is normal in structure. Aortic valve regurgitation is  not visualized. No aortic stenosis is present.  5. The inferior vena cava is normal in size with greater than 50%  respiratory variability, suggesting right atrial pressure of 3 mmHg.  _____________   History of Present Illness     Donald Jones is a 74 y.o. male with HTN, HLD, OSA, as well as anxiety and history of gastritis.  He was admitted 04/08 with chest pain, initial enzymes negative.   Hospital Course     Consultants: None   He had a burning substernal discomfort.  It mostly occurs after meals or with anxiety.  He has taken a GI cocktail as an outpatient in the past.  No history of exertional chest pain.  His cardiac enzymes remained negative and his ECG is nonacute.  A cardiac CT was felt the most appropriate test for him, but it could not be obtained because of poor IV access and infiltrated IVs.  Dr. Margaretann Loveless reviewed the  situation with the family and the decision was made to treat GI symptoms and have a early follow-up in the office with cardiology and with GI.  Therefore, no further inpatient work-up is indicated and he is considered stable for discharge, to follow up as an outpatient.   _____________  Discharge Vitals Blood pressure (!) 158/77, pulse (!) 58, temperature 99.1 F (37.3 C), temperature source Oral, resp. rate 15, height 5\' 5"  (1.651 m), weight 93.4 kg, SpO2 98 %.  Filed Weights   07/24/19 1154  Weight: 93.4 kg    Labs & Radiologic Studies    CBC Recent Labs    07/24/19 1222  WBC 8.0  HGB 17.3*  HCT 49.4  MCV 90.0  PLT 123456   Basic Metabolic Panel Recent Labs    07/24/19 1222 07/25/19 0320  NA 136 137  K 3.9 3.6  CL 98 100  CO2 24 27  GLUCOSE 141* 113*  BUN 20 17  CREATININE 1.36* 1.33*  CALCIUM 10.3 9.6   Liver Function Tests No results for input(s): AST, ALT, ALKPHOS, BILITOT, PROT, ALBUMIN in the last 72 hours. No results for input(s): LIPASE, AMYLASE in the last 72 hours. High Sensitivity Troponin:   Recent Labs  Lab 07/04/19 0601 07/04/19 0830 07/24/19 1222 07/24/19 1900  TROPONINIHS 4 5 5 6     BNP Invalid input(s): POCBNP D-Dimer No results for input(s): DDIMER in the last 72 hours. Hemoglobin A1C No results for input(s): HGBA1C in the last 72 hours. Fasting Lipid Panel Recent Labs  07/25/19 0320  CHOL 85  HDL 41  LDLCALC 38  TRIG 28  CHOLHDL 2.1   Thyroid Function Tests No results for input(s): TSH, T4TOTAL, T3FREE, THYROIDAB in the last 72 hours.  Invalid input(s): FREET3 _____________  DG Chest 2 View  Result Date: 07/24/2019 CLINICAL DATA:  Chest pain, dyspnea EXAM: CHEST - 2 VIEW COMPARISON:  07/04/2019 chest radiograph. FINDINGS: Stable cardiomediastinal silhouette with normal heart size. No pneumothorax. No pleural effusion. Lungs appear clear, with no acute consolidative airspace disease and no pulmonary edema. IMPRESSION: No  active cardiopulmonary disease. Electronically Signed   By: Ilona Sorrel M.D.   On: 07/24/2019 12:25   DG Chest 2 View  Result Date: 07/04/2019 CLINICAL DATA:  Chest pain for 3 days EXAM: CHEST - 2 VIEW COMPARISON:  Radiograph 05/27/2019 FINDINGS: No consolidation, features of edema, pneumothorax, or effusion. Pulmonary vascularity is normally distributed. The aorta is calcified. The remaining cardiomediastinal contours are unremarkable. No acute osseous or soft tissue abnormality. IMPRESSION: No acute cardiopulmonary abnormality. Aortic Atherosclerosis (ICD10-I70.0). Electronically Signed   By: Lovena Le M.D.   On: 07/04/2019 06:17   ECHOCARDIOGRAM COMPLETE  Result Date: 07/25/2019    ECHOCARDIOGRAM REPORT   Patient Name:   Donald Jones Date of Exam: 07/25/2019 Medical Rec #:  [id number]       Height:       65.0 in Accession #:    [id number]      Weight:       206.0 lb Date of Birth:  December 15, 1945       BSA:          2.003 m Patient Age:    74 years        BP:           133/74 mmHg Patient Gender: M               HR:           60 bpm. Exam Location:  Inpatient Procedure: 2D Echo, Cardiac Doppler and Color Doppler Indications:    Chest Pain 786.50 / R07.9  History:        Patient has no prior history of Echocardiogram examinations.                 Signs/Symptoms:Chest Pain; Risk Factors:Hypertension, Diabetes,                 Dyslipidemia and Non-Smoker. GERD.  Sonographer:    Vickie Epley RDCS Referring Phys: [id number] [place]  1. Left ventricular ejection fraction, by estimation, is 60 to 65%. The left ventricle has normal function. The left ventricle has no regional wall motion abnormalities. Left ventricular diastolic parameters were normal.  2. Right ventricular systolic function is normal. The right ventricular size is normal. Tricuspid regurgitation signal is inadequate for assessing PA pressure.  3. The mitral valve is normal in structure. No evidence of mitral valve regurgitation. No  evidence of mitral stenosis.  4. The aortic valve is normal in structure. Aortic valve regurgitation is not visualized. No aortic stenosis is present.  5. The inferior vena cava is normal in size with greater than 50% respiratory variability, suggesting right atrial pressure of 3 mmHg. FINDINGS  Left Ventricle: Left ventricular ejection fraction, by estimation, is 60 to 65%. The left ventricle has normal function. The left ventricle has no regional wall motion abnormalities. The left ventricular internal cavity size was normal in size. There is  no left ventricular hypertrophy. Left ventricular diastolic  parameters were normal. Normal left ventricular filling pressure. Right Ventricle: The right ventricular size is normal. No increase in right ventricular wall thickness. Right ventricular systolic function is normal. Tricuspid regurgitation signal is inadequate for assessing PA pressure. Left Atrium: Left atrial size was normal in size. Right Atrium: Right atrial size was normal in size. Pericardium: There is no evidence of pericardial effusion. Mitral Valve: The mitral valve is normal in structure. Normal mobility of the mitral valve leaflets. No evidence of mitral valve regurgitation. No evidence of mitral valve stenosis. Tricuspid Valve: The tricuspid valve is normal in structure. Tricuspid valve regurgitation is not demonstrated. No evidence of tricuspid stenosis. Aortic Valve: The aortic valve is normal in structure. Aortic valve regurgitation is not visualized. No aortic stenosis is present. Pulmonic Valve: The pulmonic valve was normal in structure. Pulmonic valve regurgitation is not visualized. No evidence of pulmonic stenosis. Aorta: The aortic root is normal in size and structure. Venous: The inferior vena cava is normal in size with greater than 50% respiratory variability, suggesting right atrial pressure of 3 mmHg. IAS/Shunts: No atrial level shunt detected by color flow Doppler.  LEFT VENTRICLE PLAX  2D LVIDd:         4.10 cm      Diastology LVIDs:         2.60 cm      LV e' lateral:   9.68 cm/s LV PW:         0.70 cm      LV E/e' lateral: 8.3 LV IVS:        0.70 cm      LV e' medial:    8.70 cm/s LVOT diam:     1.80 cm      LV E/e' medial:  9.2 LV SV:         67 LV SV Index:   33 LVOT Area:     2.54 cm  LV Volumes (MOD) LV vol d, MOD A2C: 75.5 ml LV vol d, MOD A4C: 111.0 ml LV vol s, MOD A2C: 39.9 ml LV vol s, MOD A4C: 49.8 ml LV SV MOD A2C:     35.6 ml LV SV MOD A4C:     111.0 ml LV SV MOD BP:      50.7 ml RIGHT VENTRICLE RV S prime:     13.70 cm/s TAPSE (M-mode): 2.3 cm LEFT ATRIUM             Index       RIGHT ATRIUM          Index LA diam:        2.40 cm 1.20 cm/m  RA Area:     9.60 cm LA Vol (A2C):   29.9 ml 14.93 ml/m RA Volume:   18.00 ml 8.99 ml/m LA Vol (A4C):   22.0 ml 10.98 ml/m LA Biplane Vol: 26.4 ml 13.18 ml/m  AORTIC VALVE LVOT Vmax:   114.00 cm/s LVOT Vmean:  78.600 cm/s LVOT VTI:    0.262 m  AORTA Ao Root diam: 3.10 cm MITRAL VALVE MV Area (PHT): 2.83 cm     SHUNTS MV Decel Time: 268 msec     Systemic VTI:  0.26 m MV E velocity: 80.20 cm/s   Systemic Diam: 1.80 cm MV A velocity: 111.00 cm/s MV E/A ratio:  0.72 Mihai Croitoru MD Electronically signed by Sanda Klein MD Signature Date/Time: 07/25/2019/1:44:03 PM    Final    Disposition   Pt is being discharged home today in good  condition.  Follow-up Plans & Appointments    Follow-up Information    Jettie Booze, MD Follow up.   Specialties: Cardiology, Radiology, Interventional Cardiology Why: Follow-up in a couple of weeks, the office will call Contact information: [id number] [place] [address] [phone]        Milus Banister, MD. Schedule an appointment as soon as possible for a visit.   Specialty: Gastroenterology Contact information: [address] [place] [address] [phone]          Discharge Instructions    Diet - low sodium heart healthy   Complete by:  As directed    Diet Carb Modified   Complete by: As directed    Increase activity slowly   Complete by: As directed       Discharge Medications   Allergies as of 07/25/2019      Reactions   Sulfa Antibiotics Nausea And Vomiting      Medication List    TAKE these medications   ALPRAZolam 0.5 MG tablet Commonly known as: Xanax Take 0.5-1 tablets (0.25-0.5 mg total) by mouth 2 (two) times daily as needed for anxiety.   AMBULATORY NON FORMULARY MEDICATION GI Cocktail - 90 mls of 2% Lidocaine                      90 mls Dicyclomine 10mg /32ml                    270 mls Maalox Take 5-10 ml every 4-6 hours as needed   amLODipine 10 MG tablet Commonly known as: NORVASC Take 1 tablet (10 mg total) by mouth daily.   aspirin 81 MG tablet Take 81 mg by mouth daily.   atorvastatin 40 MG tablet Commonly known as: LIPITOR Take 1 tablet (40 mg total) by mouth at bedtime.   Azelastine HCl 137 MCG/SPRAY Soln Place 2 sprays into the nose 2 (two) times daily.   diclofenac sodium 1 % Gel Commonly known as: VOLTAREN Apply 4 g topically 3 (three) times daily as needed.   dicyclomine 20 MG tablet Commonly known as: BENTYL Take 1 tablet (20 mg total) by mouth 3 (three) times daily before meals.   escitalopram 10 MG tablet Commonly known as: LEXAPRO Take 1 tablet (10 mg total) by mouth daily.   famotidine 20 MG tablet Commonly known as: PEPCID Take 1 tablet (20 mg total) by mouth 2 (two) times daily before a meal.   FDgard 25-20.75 MG Caps Generic drug: [place] Oil-Levomenthol Take 2 tablets by mouth in the morning and at bedtime.   Flaxseed Oil 1000 MG Caps Take 2,000 mg by mouth daily.   hydrochlorothiazide 25 MG tablet Commonly known as: HYDRODIURIL Take 1 tablet (25 mg total) by mouth daily.   hydrocortisone 2.5 % cream Apply topically 2 (two) times daily.   metFORMIN 500 MG tablet Commonly known as: GLUCOPHAGE Take 1 tablet (500 mg total) by mouth 2 (two) times daily  with a meal.   multivitamin tablet Take 1 tablet by mouth daily.   omeprazole 40 MG capsule Commonly known as: PRILOSEC Take 1 capsule (40 mg total) by mouth 2 (two) times daily before a meal.   sildenafil 20 MG tablet Commonly known as: REVATIO TAKE 3 TO 4 TABLETS BY MOUTH AT BEDTIME AS NEEDED. What changed: See the new instructions.   sitaGLIPtin 100 MG tablet Commonly known as: Januvia Take 1 tablet (100 mg total) by mouth daily.  UNABLE TO FIND Take 5-10 mLs by mouth every 6 (six) hours as needed. GI Cocktail 2021          Outstanding Labs/Studies   None  Duration of Discharge Encounter   Greater than 30 minutes including physician time.  Signed, Rosaria Ferries, PA-C 07/25/2019, 7:32 PM

## 2019-07-25 NOTE — Progress Notes (Signed)
  Echocardiogram 2D Echocardiogram has been performed.  Donald Jones 07/25/2019, 9:35 AM

## 2019-07-25 NOTE — Progress Notes (Addendum)
Progress Note  Patient Name: Donald Jones Date of Encounter: 07/25/2019  Primary Cardiologist: Larae Grooms, MD   Subjective   Patient continues to have epigastric discomfort.  No shortness of breath or associated symptoms.  Plan was for coronary CTA today  Inpatient Medications    Scheduled Meds: . amLODipine  10 mg Oral Daily  . aspirin EC  81 mg Oral Daily  . atorvastatin  40 mg Oral QHS  . escitalopram  10 mg Oral Daily  . famotidine  20 mg Oral BID AC  . heparin  5,000 Units Subcutaneous Q8H  . insulin aspart  0-15 Units Subcutaneous TID WC  . pantoprazole  40 mg Oral BID AC   Continuous Infusions:  PRN Meds: acetaminophen, ALPRAZolam, alum & mag hydroxide-simeth **AND** lidocaine, nitroGLYCERIN   Vital Signs    Vitals:   07/24/19 2234 07/24/19 2312 07/25/19 0046 07/25/19 0505  BP: (!) 164/85 (!) 155/81 131/77 133/74  Pulse: 60  (!) 56 (!) 57  Resp: 13  15 15   Temp: 98.1 F (36.7 C)  98.2 F (36.8 C) 98.2 F (36.8 C)  TempSrc: Oral  Oral Oral  SpO2: 98%  98% 97%  Weight:      Height:       No intake or output data in the 24 hours ending 07/25/19 0746 Filed Weights   07/24/19 1154  Weight: 93.4 kg    Physical Exam   General: Well developed, well nourished, NAD Neck: Negative for carotid bruits. No JVD Lungs:Clear to ausculation bilaterally. No wheezes, rales, or rhonchi. Breathing is unlabored. Cardiovascular: RRR with S1 S2. No murmurs Extremities: No edema. Radial pulses 2+ bilaterally Neuro: Alert and oriented. No focal deficits. No facial asymmetry. MAE spontaneously. Psych: Responds to questions appropriately with normal affect.    Labs    Chemistry Recent Labs  Lab 07/24/19 1222 07/25/19 0320  NA 136 137  K 3.9 3.6  CL 98 100  CO2 24 27  GLUCOSE 141* 113*  BUN 20 17  CREATININE 1.36* 1.33*  CALCIUM 10.3 9.6  GFRNONAA 52* 53*  GFRAA >60 >60  ANIONGAP 14 10     Hematology Recent Labs  Lab 07/24/19 1222  WBC 8.0    RBC 5.49  HGB 17.3*  HCT 49.4  MCV 90.0  MCH 31.5  MCHC 35.0  RDW 12.2  PLT 305    Cardiac EnzymesNo results for input(s): TROPONINI in the last 168 hours. No results for input(s): TROPIPOC in the last 168 hours.   BNPNo results for input(s): BNP, PROBNP in the last 168 hours.   DDimer No results for input(s): DDIMER in the last 168 hours.   Radiology    DG Chest 2 View  Result Date: 07/24/2019 CLINICAL DATA:  Chest pain, dyspnea EXAM: CHEST - 2 VIEW COMPARISON:  07/04/2019 chest radiograph. FINDINGS: Stable cardiomediastinal silhouette with normal heart size. No pneumothorax. No pleural effusion. Lungs appear clear, with no acute consolidative airspace disease and no pulmonary edema. IMPRESSION: No active cardiopulmonary disease. Electronically Signed   By: Ilona Sorrel M.D.   On: 07/24/2019 12:25   Telemetry    NSR- Personally Reviewed  ECG    No new tracing as of 05/07/2019- Personally Reviewed  Cardiac Studies   Stress test 04/08/2018:   The left ventricular ejection fraction is normal (55-65%).  Nuclear stress EF: 60%.  Blood pressure demonstrated a normal response to exercise.  The study is normal.  This is a low risk study.  Normal resting and stress perfusion. No ischemia or infarction EF 60% Baseline ECG with ST changes and artifact during stress so nondiagnostic Patient achieved only 87% PMHR Normal hemodynamic response  Patient Profile     74 y.o. male with HTN, HLD, OSA, as well as anxiety and history of gastritis who presents with recurrent of atypical chest pain.  Assessment & Plan    1.  Atypical chest pain: -Continue to favor GI etiology given response to GI cocktail, but also improved with SLN and has CAD risk factors.  -Will plan for coronary CTA given repeated presentations to r/o ischemia. -Risk stratify with HbA1c and Lipid Panel.  -Continue home ASA, atorvastatin.   2. Gastritis: -Continued home H2 blocker, PPI -Has PRN GI  cocktail ordered.  -Refer back to GI if no cardiac etiology identified.  3. HTN: -Continue home amlodipine.  -Hold HCTZ while inpatient.   4. DM2 -Hold metformin, Januvia while in house.  -FSGs and ISS.   5. OSA -CPAP ordered.   6. Anxiety -Continued home Lexapro -Xanax 0.25 PRN   Signed, Kathyrn Drown NP-C [facility] Pager: [phone] 07/25/2019, 7:46 AM     For questions or updates, please contact   Please consult [email or web address] for contact info under Cardiology/STEMI.  Patient seen and examined with Kathyrn Drown PA-C.  Agree as above, with the following exceptions and changes as noted below. He feels it is not his heart, and is not very worried about his heart. He feels it is indigestion and gets better with antacids and GI cocktail. Gen: NAD, CV: RRR, no murmurs, Lungs: clear, Abd: soft, Extrem: Warm, well perfused, no edema, Neuro/Psych: alert and oriented x 3, normal mood and affect. All available labs, radiology testing, previous records reviewed. We will obtain a CCTA after shared decision making today with patient and wife given risk factors of HTN, HLD, and DM2 on metformin to ensure that the burning epigastric pain is not atypical presentation of multivessel disease with repeated presentations.   Elouise Munroe, MD 07/25/19 10:35 AM

## 2019-07-26 LAB — HEMOGLOBIN A1C
Hgb A1c MFr Bld: 6 % — ABNORMAL HIGH (ref 4.8–5.6)
Mean Plasma Glucose: 126 mg/dL

## 2019-07-28 ENCOUNTER — Encounter: Payer: Self-pay | Admitting: Internal Medicine

## 2019-07-30 ENCOUNTER — Ambulatory Visit: Payer: BC Managed Care – PPO | Admitting: Internal Medicine

## 2019-07-30 ENCOUNTER — Other Ambulatory Visit: Payer: Self-pay

## 2019-07-30 ENCOUNTER — Encounter: Payer: Self-pay | Admitting: Internal Medicine

## 2019-07-30 VITALS — BP 150/75 | HR 70 | Temp 96.5°F | Resp 18 | Ht 65.0 in | Wt 197.1 lb

## 2019-07-30 DIAGNOSIS — F419 Anxiety disorder, unspecified: Secondary | ICD-10-CM

## 2019-07-30 DIAGNOSIS — K589 Irritable bowel syndrome without diarrhea: Secondary | ICD-10-CM | POA: Diagnosis not present

## 2019-07-30 DIAGNOSIS — E119 Type 2 diabetes mellitus without complications: Secondary | ICD-10-CM | POA: Diagnosis not present

## 2019-07-30 NOTE — Progress Notes (Signed)
Pre visit review using our clinic review tool, if applicable. No additional management support is needed unless otherwise documented below in the visit note. 

## 2019-07-30 NOTE — Patient Instructions (Addendum)
Per our records you are due for an eye exam. Please contact your eye doctor to schedule an appointment. Please have them send copies of your office visit notes to [place]. Our fax number is [phone].

## 2019-07-30 NOTE — Progress Notes (Signed)
Subjective:    Patient ID: Donald Jones, male    DOB: Apr 02, 1946, 74 y.o.   MRN: [id number]  DOS:  07/30/2019 Type of visit - description: Acute Was seen here 07/22/2019.  Was evaluated for anxiety, started Lexapro.  Also prescribed Xanax 2 days later was admitted to the hospital with ongoing chest pain, cardiac enzymes negative, they recommend a cardiac CT but unable to pursue due to poor IV access. They eventually agreed to DC home with follow-up at cardiology and GI.  Review of Systems At this point he continue with a burning feeling at the right side of the chest, decreased by a GI cocktail and by taking Xanax. He continue holding Metformin.  Past Medical History:  Diagnosis Date  . Arthritis    hands right  . Diabetes mellitus without complication ([place])   . GERD (gastroesophageal reflux disease)   . Hypertension   . Kidney stones    several episodes.  Last one in 2011.    . Obesity    100-lb weight loss since 2011.   . Polycythemia   . Sleep apnea    on Cpap    Past Surgical History:  Procedure Laterality Date  . CHOLECYSTECTOMY    . HAND SURGERY  2010   R hand , d/t a injury, 3 surgeries     Allergies as of 07/30/2019      Reactions   Sulfa Antibiotics Nausea And Vomiting      Medication List       Accurate as of July 30, 2019 11:59 PM. If you have any questions, ask your nurse or doctor.        STOP taking these medications   metFORMIN 500 MG tablet Commonly known as: GLUCOPHAGE Stopped by: Kathlene November, MD   UNABLE TO FIND Stopped by: Kathlene November, MD     TAKE these medications   ALPRAZolam 0.5 MG tablet Commonly known as: Xanax Take 0.5-1 tablets (0.25-0.5 mg total) by mouth 2 (two) times daily as needed for anxiety.   AMBULATORY NON FORMULARY MEDICATION GI Cocktail - 90 mls of 2% Lidocaine                      90 mls Dicyclomine 10mg /12ml                    270 mls Maalox Take 5-10 ml every 4-6 hours as needed   amLODipine 10 MG tablet Commonly  known as: NORVASC Take 1 tablet (10 mg total) by mouth daily.   aspirin 81 MG tablet Take 81 mg by mouth daily.   atorvastatin 40 MG tablet Commonly known as: LIPITOR Take 1 tablet (40 mg total) by mouth at bedtime.   Azelastine HCl 137 MCG/SPRAY Soln Place 2 sprays into the nose 2 (two) times daily.   diclofenac sodium 1 % Gel Commonly known as: VOLTAREN Apply 4 g topically 3 (three) times daily as needed.   dicyclomine 20 MG tablet Commonly known as: BENTYL Take 1 tablet (20 mg total) by mouth 3 (three) times daily before meals.   escitalopram 10 MG tablet Commonly known as: LEXAPRO Take 1 tablet (10 mg total) by mouth daily.   famotidine 20 MG tablet Commonly known as: PEPCID Take 1 tablet (20 mg total) by mouth 2 (two) times daily before a meal.   FDgard 25-20.75 MG Caps Generic drug: [place] Oil-Levomenthol Take 2 tablets by mouth in the morning and at bedtime.   Flaxseed Oil  1000 MG Caps Take 2,000 mg by mouth daily.   hydrochlorothiazide 25 MG tablet Commonly known as: HYDRODIURIL Take 1 tablet (25 mg total) by mouth daily.   hydrocortisone 2.5 % cream Apply topically 2 (two) times daily.   multivitamin tablet Take 1 tablet by mouth daily.   omeprazole 40 MG capsule Commonly known as: PRILOSEC Take 1 capsule (40 mg total) by mouth 2 (two) times daily before a meal.   sildenafil 20 MG tablet Commonly known as: REVATIO TAKE 3 TO 4 TABLETS BY MOUTH AT BEDTIME AS NEEDED. What changed: See the new instructions.   sitaGLIPtin 100 MG tablet Commonly known as: Januvia Take 1 tablet (100 mg total) by mouth daily.          Objective:   Physical Exam BP (!) 150/75 (BP Location: Left Arm, Patient Position: Sitting, Cuff Size: Normal)   Pulse 70   Temp (!) 96.5 F (35.8 C) (Temporal)   Resp 18   Ht 5\' 5"  (1.651 m)   Wt 197 lb 2 oz (89.4 kg)   SpO2 95%   BMI 32.80 kg/m  General:   Well developed, NAD, BMI noted. HEENT:  Normocephalic . Face  symmetric, atraumatic  Skin: Not pale. Not jaundice Neurologic:  alert & oriented X3.  Speech normal, gait appropriate for age and unassisted Psych--  Cognition and judgment appear intact.  Cooperative with normal attention span and concentration.  Behavior appropriate. Continue to appear anxious but not depressed    Assessment     ASSESSMENT DM (pre-DM until 2016) HTN Polycythemia- sees hematology, likely d/t OSA-diuretics, JAK2 (-) ~ 2014, last OV 10-2014, f/u prn OSA on CPAP DJD- saw Dr Rhona Raider before  R Foot drop  GI: -GERD - IBS? GI sx on and off, previously dx w/ IBS -Cscopes: [id number], 09/18/2017 - EGD 04/2018, showed gastritis, no H. pylori, no malignancy Urolithiasis, several episodes   PLAN: Anxiety: Since the last office visit, he continue with GI sxs and CP, was admitted to hospital.  See HPI. Plan is to continue with Lexapro (he just started, will take  more time to work), I encouraged him to use Xanax as needed, watch for drowsiness. I also stressed the need to seek counseling, information provided again today. GERD, IBS: Ongoing chest pain, decreased with a GI cocktail.  Also, is the pt's observation that xanax helps  Thus rec to use it prn. DM: Today he again states that since he started Metformin his stomach "has never been the same" thus  I agreed to stop Metformin, continue Januvia. RTC scheduled for May 2021    This visit occurred during the SARS-CoV-2 public health emergency.  Safety protocols were in place, including screening questions prior to the visit, additional usage of staff PPE, and extensive cleaning of exam room while observing appropriate contact time as indicated for disinfecting solutions.

## 2019-08-01 NOTE — Assessment & Plan Note (Signed)
Anxiety: Since the last office visit, he continue with GI sxs and CP, was admitted to hospital.  See HPI. Plan is to continue with Lexapro (he just started, will take  more time to work), I encouraged him to use Xanax as needed, watch for drowsiness. I also stressed the need to seek counseling, information provided again today. GERD, IBS: Ongoing chest pain, decreased with a GI cocktail.  Also, is the pt's observation that xanax helps  Thus rec to use it prn. DM: Today he again states that since he started Metformin his stomach "has never been the same" thus  I agreed to stop Metformin, continue Januvia. RTC scheduled for May 2021

## 2019-08-02 NOTE — Progress Notes (Signed)
Cardiology Office Note   Date:  08/04/2019   ID:  Donald Jones, DOB [birth year], MRN [id number]  PCP:  Colon Branch, MD    No chief complaint on file.  Chest pain  Wt Readings from Last 3 Encounters:  08/04/19 199 lb 12.8 oz (90.6 kg)  07/30/19 197 lb 2 oz (89.4 kg)  07/24/19 206 lb (93.4 kg)       History of Present Illness: Donald Jones is a 74 y.o. male   Who I saw for chest pain in 12/19.  He has had chronic GI issues and endoscopy was planned.    Stress test in 12/19 showed:  "The left ventricular ejection fraction is normal (55-65%).  Nuclear stress EF: 60%.  Blood pressure demonstrated a normal response to exercise.  The study is normal.  This is a low risk study.  Normal resting and stress perfusion. No ischemia or infarction EF 60% Baseline ECG with ST changes and artifact during stress so nondiagnostic Patient achieved only 87% PMHR Normal hemodynamic response"  Hospitalized in 4/21 with atypical chest pain: "He had a burning substernal discomfort.  It mostly occurs after meals or with anxiety.  He has taken a GI cocktail as an outpatient in the past.  No history of exertional chest pain.  His cardiac enzymes remained negative and his ECG is nonacute.  A cardiac CT was felt the most appropriate test for him, but it could not be obtained because of poor IV access and infiltrated IVs.  Dr. Margaretann Loveless reviewed the situation with the family and the decision was made to treat GI symptoms and have a early follow-up in the office with cardiology and with GI.  Therefore, no further inpatient work-up is indicated and he is considered stable for discharge, to follow up as an outpatient."   2021 echo showed normal LV function and valvular function.  He continues to have occasional tightness in his chest.  It feels like gas pain to him.  No pain in the last three days.  He is a Contractor.  Feels well at work.  GI cocktail seems to help.   Xanax helps as well.     Past Medical History:  Diagnosis Date  . Arthritis    hands right  . Diabetes mellitus without complication ([place])   . GERD (gastroesophageal reflux disease)   . Hypertension   . Kidney stones    several episodes.  Last one in 2011.    . Obesity    100-lb weight loss since 2011.   . Polycythemia   . Sleep apnea    on Cpap    Past Surgical History:  Procedure Laterality Date  . CHOLECYSTECTOMY    . HAND SURGERY  2010   R hand , d/t a injury, 3 surgeries      Current Outpatient Medications  Medication Sig Dispense Refill  . ALPRAZolam (XANAX) 0.5 MG tablet Take 0.5-1 tablets (0.25-0.5 mg total) by mouth 2 (two) times daily as needed for anxiety. 30 tablet 0  . AMBULATORY NON FORMULARY MEDICATION GI Cocktail - 90 mls of 2% Lidocaine                      90 mls Dicyclomine 10mg /23ml                    270 mls Maalox Take 5-10 ml every 4-6 hours as needed 120 mL 0  . amLODipine (NORVASC) 10 MG  tablet Take 1 tablet (10 mg total) by mouth daily. 90 tablet 1  . aspirin 81 MG tablet Take 81 mg by mouth daily.    Marland Kitchen atorvastatin (LIPITOR) 40 MG tablet Take 1 tablet (40 mg total) by mouth at bedtime. 90 tablet 1  . Azelastine HCl 137 MCG/SPRAY SOLN Place 2 sprays into the nose 2 (two) times daily. 30 mL 6  . Caraway Oil-Levomenthol (FDGARD) 25-20.75 MG CAPS Take 2 tablets by mouth in the morning and at bedtime.     . diclofenac sodium (VOLTAREN) 1 % GEL Apply 4 g topically 3 (three) times daily as needed. 100 g 3  . dicyclomine (BENTYL) 20 MG tablet Take 1 tablet (20 mg total) by mouth 3 (three) times daily before meals. 270 tablet 3  . escitalopram (LEXAPRO) 10 MG tablet Take 1 tablet (10 mg total) by mouth daily. 30 tablet 3  . famotidine (PEPCID) 20 MG tablet Take 1 tablet (20 mg total) by mouth 2 (two) times daily before a meal. 180 tablet 3  . Flaxseed, Linseed, (FLAXSEED OIL) 1000 MG CAPS Take 2,000 mg by mouth daily.     . hydrochlorothiazide (HYDRODIURIL)  25 MG tablet Take 1 tablet (25 mg total) by mouth daily. 90 tablet 3  . hydrocortisone 2.5 % cream Apply topically 2 (two) times daily. 60 g 1  . Multiple Vitamin (MULTIVITAMIN) tablet Take 1 tablet by mouth daily.     Marland Kitchen omeprazole (PRILOSEC) 40 MG capsule Take 1 capsule (40 mg total) by mouth 2 (two) times daily before a meal. 180 capsule 3  . sildenafil (REVATIO) 20 MG tablet TAKE 3 TO 4 TABLETS BY MOUTH AT BEDTIME AS NEEDED. (Patient taking differently: Take 60-80 mg by mouth at bedtime as needed. ) 30 tablet 3  . sitaGLIPtin (JANUVIA) 100 MG tablet Take 1 tablet (100 mg total) by mouth daily. 30 tablet 5   No current facility-administered medications for this visit.    Allergies:   Sulfa antibiotics    Social History:  The patient  reports that he has never smoked. He has never used smokeless tobacco. He reports that he does not drink alcohol or use drugs.   Family History:  The patient's family history includes Alcohol abuse in his sister; Colon cancer (age of onset: 41) in his sister; Coronary artery disease in his mother; Diabetes in his mother; Heart disease in his sister; Hypertension in his father; Stroke in his father.    ROS:  Please see the history of present illness.   Otherwise, review of systems are positive for anxiety.   All other systems are reviewed and negative.    PHYSICAL EXAM: VS:  BP (!) 120/56   Pulse 67   Ht 5\' 5"  (1.651 m)   Wt 199 lb 12.8 oz (90.6 kg)   SpO2 97%   BMI 33.25 kg/m  , BMI Body mass index is 33.25 kg/m. GEN: Well nourished, well developed, in no acute distress  HEENT: normal  Neck: no JVD, carotid bruits, or masses Cardiac: RRR; no murmurs, rubs, or gallops,no edema  Respiratory:  clear to auscultation bilaterally, normal work of breathing GI: soft, nontender, nondistended, + BS MS: no deformity or atrophy  Skin: warm and dry, no rash Neuro:  Strength and sensation are intact Psych: euthymic mood, full affect   EKG:   The ekg  ordered 07/25/19 demonstrates NSR, no ST segment changes   Recent Labs: 04/17/2019: TSH 0.56 07/07/2019: ALT 25 07/24/2019: Hemoglobin 17.3; Platelets 305 07/25/2019:  BUN 17; Creatinine, Ser 1.33; Potassium 3.6; Sodium 137   Lipid Panel    Component Value Date/Time   CHOL 85 07/25/2019 0320   TRIG 28 07/25/2019 0320   HDL 41 07/25/2019 0320   CHOLHDL 2.1 07/25/2019 0320   VLDL 6 07/25/2019 0320   LDLCALC 38 07/25/2019 0320     Other studies Reviewed: Additional studies/ records that were reviewed today with results demonstrating: PMD/hospital labs reviewed; LDL 38 in 4/21.   ASSESSMENT AND PLAN:  1. Precordial Chest pain: Still with some atypical sx after hospital discharge.  Given multiple RF for CAD,  Plan for CTA coronaries.   2. Type 2 DM: A1C 6.0.  Continue Januvia.  3. Obesity: Lost a little weight recently. Healthy, whole food plant based diet recommended.   4. Hyperlipidemia: LDL 38. Continue atorvastatin.  5. HTN: The current medical regimen is effective;  continue present plan and medications. 6. CKD: GFR 53 in 4/21.  Encouraged oral hydration.  Consider adding ACE-I and stopping diuretic after CTA.  Would not start now due to risk of CIN.     Current medicines are reviewed at length with the patient today.  The patient concerns regarding his medicines were addressed.  The following changes have been made:  No change  Labs/ tests ordered today include:  No orders of the defined types were placed in this encounter.   Recommend 150 minutes/week of aerobic exercise Low fat, low carb, high fiber diet recommended  Disposition:   FU for CT scan   Signed, Larae Grooms, MD  08/04/2019 8:31 AM    [facility] [address], [place], [place]  [address] Phone: [phone]; Fax: [phone]

## 2019-08-03 DIAGNOSIS — G4733 Obstructive sleep apnea (adult) (pediatric): Secondary | ICD-10-CM | POA: Diagnosis not present

## 2019-08-04 ENCOUNTER — Encounter: Payer: Self-pay | Admitting: Interventional Cardiology

## 2019-08-04 ENCOUNTER — Ambulatory Visit: Payer: BC Managed Care – PPO | Admitting: Interventional Cardiology

## 2019-08-04 ENCOUNTER — Other Ambulatory Visit: Payer: Self-pay

## 2019-08-04 VITALS — BP 120/56 | HR 67 | Ht 65.0 in | Wt 199.8 lb

## 2019-08-04 DIAGNOSIS — E782 Mixed hyperlipidemia: Secondary | ICD-10-CM | POA: Diagnosis not present

## 2019-08-04 DIAGNOSIS — I10 Essential (primary) hypertension: Secondary | ICD-10-CM

## 2019-08-04 DIAGNOSIS — R072 Precordial pain: Secondary | ICD-10-CM

## 2019-08-04 DIAGNOSIS — E119 Type 2 diabetes mellitus without complications: Secondary | ICD-10-CM | POA: Diagnosis not present

## 2019-08-04 MED ORDER — METOPROLOL TARTRATE 50 MG PO TABS: ORAL_TABLET | ORAL | 0 refills | Status: DC

## 2019-08-04 NOTE — Patient Instructions (Addendum)
Medication Instructions:  Your physician recommends that you continue on your current medications as directed. Please refer to the Current Medication list given to you today.  *If you need a refill on your cardiac medications before your next appointment, please call your pharmacy*   Lab Work: None today  If you have labs (blood work) drawn today and your tests are completely normal, you will receive your results only by: Marland Kitchen MyChart Message (if you have MyChart) OR . A paper copy in the mail If you have any lab test that is abnormal or we need to change your treatment, we will call you to review the results.   Testing/Procedures: Your physician has requested that you have cardiac CT. Cardiac computed tomography (CT) is a painless test that uses an x-ray machine to take clear, detailed pictures of your heart. For further information please visit [email or web address]. Please follow instruction sheet as given.  Follow-Up: At [facility], you and your health needs are our priority.  As part of our continuing mission to provide you with exceptional heart care, we have created designated Provider Care Teams.  These Care Teams include your primary Cardiologist (physician) and Advanced Practice Providers (APPs -  Physician Assistants and Nurse Practitioners) who all work together to provide you with the care you need, when you need it.  We recommend signing up for the patient portal called "MyChart".  Sign up information is provided on this After Visit Summary.  MyChart is used to connect with patients for Virtual Visits (Telemedicine).  Patients are able to view lab/test results, encounter notes, upcoming appointments, etc.  Non-urgent messages can be sent to your provider as well.   To learn more about what you can do with MyChart, go to [email or web address].    Your next appointment:   12 month(s)  The format for your next appointment:   In Person  Provider:   You may see Larae Grooms, MD or one of the following Advanced Practice Providers on your designated Care Team:    Melina Copa, PA-C  Ermalinda Barrios, PA-C    Other Instructions Your cardiac CT will be scheduled at one of the below locations:   [facility] [address] [place], [place] [address] [phone]  [place] [address] [place], [place] [address] [phone]  If scheduled at [facility], please arrive at the [facility] main entrance of [facility] 30 minutes prior to test start time. Proceed to the [facility] Radiology Department (first floor) to check-in and test prep.  If scheduled at [facility], please arrive 15 mins early for check-in and test prep.  Please follow these instructions carefully (unless otherwise directed):  Hold all erectile dysfunction medications at least 3 days (72 hrs) prior to test.  On the Night Before the Test: . Be sure to Drink plenty of water. . Do not consume any caffeinated/decaffeinated beverages or chocolate 12 hours prior to your test. . Do not take any antihistamines 12 hours prior to your test.   On the Day of the Test: . Drink plenty of water. Do not drink any water within one hour of the test. . Do not eat any food 4 hours prior to the test. . You may take your regular medications prior to the test.  . Take metoprolol (Lopressor) 50 MG two hours prior to test. . HOLD Hydrochlorothiazide morning of the test. . HOLD Januvia the morning of  the test.    After the Test: . Drink plenty of water. . After receiving IV contrast, you may experience a mild flushed feeling. This is normal. . On occasion, you may experience a mild rash up to 24 hours after the test. This is not dangerous. If this occurs, you can take Benadryl 25 mg and increase your fluid intake. . If you experience trouble breathing, this can be serious. If it is severe  call [phone] IMMEDIATELY. If it is mild, please call our office.  Once we have confirmed authorization from your insurance company, we will call you to set up a date and time for your test.   For non-scheduling related questions, please contact the cardiac imaging nurse navigator should you have any questions/concerns: Marchia Bond, RN Navigator Cardiac Imaging [facility] [phone] office  For scheduling needs, including cancellations and rescheduling, please call [phone].

## 2019-08-08 ENCOUNTER — Ambulatory Visit (INDEPENDENT_AMBULATORY_CARE_PROVIDER_SITE_OTHER): Payer: BC Managed Care – PPO | Admitting: Psychologist

## 2019-08-08 DIAGNOSIS — F410 Panic disorder [episodic paroxysmal anxiety] without agoraphobia: Secondary | ICD-10-CM | POA: Diagnosis not present

## 2019-08-08 DIAGNOSIS — F411 Generalized anxiety disorder: Secondary | ICD-10-CM | POA: Diagnosis not present

## 2019-08-11 ENCOUNTER — Ambulatory Visit (INDEPENDENT_AMBULATORY_CARE_PROVIDER_SITE_OTHER): Payer: BC Managed Care – PPO | Admitting: Psychologist

## 2019-08-11 DIAGNOSIS — F411 Generalized anxiety disorder: Secondary | ICD-10-CM

## 2019-08-11 DIAGNOSIS — F410 Panic disorder [episodic paroxysmal anxiety] without agoraphobia: Secondary | ICD-10-CM

## 2019-08-18 ENCOUNTER — Ambulatory Visit: Payer: BC Managed Care – PPO | Admitting: Internal Medicine

## 2019-08-25 ENCOUNTER — Ambulatory Visit (INDEPENDENT_AMBULATORY_CARE_PROVIDER_SITE_OTHER): Payer: BC Managed Care – PPO | Admitting: Psychologist

## 2019-08-25 DIAGNOSIS — F411 Generalized anxiety disorder: Secondary | ICD-10-CM

## 2019-08-25 DIAGNOSIS — F410 Panic disorder [episodic paroxysmal anxiety] without agoraphobia: Secondary | ICD-10-CM | POA: Diagnosis not present

## 2019-08-29 ENCOUNTER — Other Ambulatory Visit: Payer: BC Managed Care – PPO | Admitting: *Deleted

## 2019-08-29 ENCOUNTER — Other Ambulatory Visit: Payer: Self-pay

## 2019-08-29 DIAGNOSIS — E119 Type 2 diabetes mellitus without complications: Secondary | ICD-10-CM

## 2019-08-29 DIAGNOSIS — R072 Precordial pain: Secondary | ICD-10-CM | POA: Diagnosis not present

## 2019-08-29 DIAGNOSIS — E782 Mixed hyperlipidemia: Secondary | ICD-10-CM

## 2019-08-29 DIAGNOSIS — I10 Essential (primary) hypertension: Secondary | ICD-10-CM

## 2019-08-30 LAB — BASIC METABOLIC PANEL
BUN/Creatinine Ratio: 12 (ref 10–24)
BUN: 17 mg/dL (ref 8–27)
CO2: 27 mmol/L (ref 20–29)
Calcium: 10 mg/dL (ref 8.6–10.2)
Chloride: 102 mmol/L (ref 96–106)
Creatinine, Ser: 1.37 mg/dL — ABNORMAL HIGH (ref 0.76–1.27)
GFR calc Af Amer: 60 mL/min/{1.73_m2} (ref >59)
GFR calc non Af Amer: 52 mL/min/{1.73_m2} — ABNORMAL LOW (ref >59)
Glucose: 130 mg/dL — ABNORMAL HIGH (ref 65–99)
Potassium: 4.3 mmol/L (ref 3.5–5.2)
Sodium: 143 mmol/L (ref 134–144)

## 2019-09-01 ENCOUNTER — Telehealth (HOSPITAL_COMMUNITY): Payer: Self-pay | Admitting: *Deleted

## 2019-09-01 NOTE — Telephone Encounter (Signed)
Reaching out to patient to offer assistance regarding upcoming cardiac imaging study; pt's wife verbalizes understanding of appt date/time, parking situation and where to check in, pre-test NPO status and medications ordered, and verified current allergies; name and call back number provided for further questions should they arise ° °Dezman Granda Tai RN Navigator Cardiac Imaging °[facility] °[phone] office °[phone] cell ° °

## 2019-09-02 ENCOUNTER — Ambulatory Visit (HOSPITAL_COMMUNITY)
Admission: RE | Admit: 2019-09-02 | Discharge: 2019-09-02 | Disposition: A | Discharge status: Home / Self Care | Payer: BC Managed Care – PPO | Source: Ambulatory Visit | Attending: Interventional Cardiology | Admitting: Interventional Cardiology

## 2019-09-02 ENCOUNTER — Other Ambulatory Visit: Payer: Self-pay

## 2019-09-02 DIAGNOSIS — I10 Essential (primary) hypertension: Secondary | ICD-10-CM | POA: Diagnosis not present

## 2019-09-02 DIAGNOSIS — E119 Type 2 diabetes mellitus without complications: Secondary | ICD-10-CM | POA: Diagnosis not present

## 2019-09-02 DIAGNOSIS — R072 Precordial pain: Secondary | ICD-10-CM

## 2019-09-02 DIAGNOSIS — E782 Mixed hyperlipidemia: Secondary | ICD-10-CM

## 2019-09-02 MED ORDER — NITROGLYCERIN 0.4 MG SL SUBL
SUBLINGUAL_TABLET | SUBLINGUAL | Status: AC
  Filled 2019-09-02: qty 2

## 2019-09-02 MED ORDER — IOHEXOL 350 MG/ML SOLN
80.0000 mL | Freq: Once | INTRAVENOUS | Status: AC | PRN
  Administered 2019-09-02: 80 mL via INTRAVENOUS

## 2019-09-02 MED ORDER — NITROGLYCERIN 0.4 MG SL SUBL
0.8000 mg | SUBLINGUAL_TABLET | Freq: Once | SUBLINGUAL | Status: AC
  Administered 2019-09-02: 0.8 mg via SUBLINGUAL

## 2019-09-05 ENCOUNTER — Telehealth (HOSPITAL_COMMUNITY): Payer: Self-pay | Admitting: *Deleted

## 2019-09-05 ENCOUNTER — Ambulatory Visit: Payer: BC Managed Care – PPO | Admitting: Internal Medicine

## 2019-09-05 ENCOUNTER — Other Ambulatory Visit: Payer: Self-pay

## 2019-09-05 DIAGNOSIS — R072 Precordial pain: Secondary | ICD-10-CM

## 2019-09-05 MED ORDER — METOPROLOL TARTRATE 50 MG PO TABS: ORAL_TABLET | ORAL | 0 refills | Status: DC

## 2019-09-05 NOTE — Telephone Encounter (Signed)
Reaching out to patient to offer assistance regarding upcoming cardiac imaging study; pt's wife verbalizes understanding of appt date/time, parking situation and where to check in, pre-test NPO status and medications ordered, and verified current allergies; name and call back number provided for further questions should they arise ° °Merle Tai RN Navigator Cardiac Imaging °[facility] °[phone] office °[phone] cell ° °

## 2019-09-08 ENCOUNTER — Encounter (HOSPITAL_COMMUNITY): Payer: Self-pay

## 2019-09-08 ENCOUNTER — Other Ambulatory Visit: Payer: Self-pay

## 2019-09-08 ENCOUNTER — Ambulatory Visit (HOSPITAL_COMMUNITY)
Admission: RE | Admit: 2019-09-08 | Discharge: 2019-09-08 | Disposition: A | Discharge status: Home / Self Care | Payer: BC Managed Care – PPO | Source: Ambulatory Visit | Attending: Interventional Cardiology | Admitting: Interventional Cardiology

## 2019-09-08 DIAGNOSIS — R072 Precordial pain: Secondary | ICD-10-CM | POA: Insufficient documentation

## 2019-09-08 MED ORDER — NITROGLYCERIN 0.4 MG SL SUBL: 0.8000 mg | SUBLINGUAL_TABLET | Freq: Once | SUBLINGUAL | Status: AC

## 2019-09-08 MED ORDER — IOHEXOL 350 MG/ML SOLN
100.0000 mL | Freq: Once | INTRAVENOUS | Status: AC | PRN
  Administered 2019-09-08: 100 mL via INTRAVENOUS

## 2019-09-08 MED ORDER — NITROGLYCERIN 0.4 MG SL SUBL
SUBLINGUAL_TABLET | SUBLINGUAL | Status: AC
  Administered 2019-09-08: 0.8 mg via SUBLINGUAL
  Filled 2019-09-08: qty 2

## 2019-09-09 ENCOUNTER — Ambulatory Visit: Payer: BC Managed Care – PPO | Admitting: Internal Medicine

## 2019-09-09 ENCOUNTER — Encounter: Payer: Self-pay | Admitting: Internal Medicine

## 2019-09-09 VITALS — BP 145/56 | HR 42 | Temp 96.4°F | Resp 18 | Ht 65.0 in | Wt 204.5 lb

## 2019-09-09 DIAGNOSIS — K589 Irritable bowel syndrome without diarrhea: Secondary | ICD-10-CM

## 2019-09-09 DIAGNOSIS — E119 Type 2 diabetes mellitus without complications: Secondary | ICD-10-CM

## 2019-09-09 DIAGNOSIS — F419 Anxiety disorder, unspecified: Secondary | ICD-10-CM

## 2019-09-09 NOTE — Patient Instructions (Addendum)
Per our records you are due for an eye exam. Please contact your eye doctor to schedule an appointment. Please have them send copies of your office visit notes to [place]. Our fax number is [phone].  Please schedule Medicare Wellness with Glenard Haring.  Once you have finished your omeprazole 40 mg, switch to omeprazole 20 mg OTC and take 1 daily for 3 to 4 weeks, then you can stop.    Call for refills when needed    [place], [place] back for   for a checkup in 3 to 4 months

## 2019-09-09 NOTE — Progress Notes (Signed)
Pre visit review using our clinic review tool, if applicable. No additional management support is needed unless otherwise documented below in the visit note. 

## 2019-09-09 NOTE — Progress Notes (Signed)
Subjective:    Patient ID: Donald Jones, male    DOB: 24-May-1945, 74 y.o.   MRN: [id number]  DOS:  09/09/2019 Type of visit - description: Follow-up Today we talk about diabetes, IBS, GERD. Also he was seen by cardiology and had a coronary calcium score.    Review of Systems Feels great. Ambulatory blood sugars are very good. GI symptoms have nearly resolved. Anxiety is now well controlled  Past Medical History:  Diagnosis Date  . Arthritis    hands right  . Diabetes mellitus without complication ([place])   . GERD (gastroesophageal reflux disease)   . Hypertension   . Kidney stones    several episodes.  Last one in 2011.    . Obesity    100-lb weight loss since 2011.   . Polycythemia   . Sleep apnea    on Cpap    Past Surgical History:  Procedure Laterality Date  . CHOLECYSTECTOMY    . HAND SURGERY  2010   R hand , d/t a injury, 3 surgeries     Allergies as of 09/09/2019      Reactions   Sulfa Antibiotics Nausea And Vomiting      Medication List       Accurate as of Sep 09, 2019 11:59 PM. If you have any questions, ask your nurse or doctor.        STOP taking these medications   omeprazole 40 MG capsule Commonly known as: PRILOSEC Stopped by: Kathlene November, MD     TAKE these medications   ALPRAZolam 0.5 MG tablet Commonly known as: Xanax Take 0.5-1 tablets (0.25-0.5 mg total) by mouth 2 (two) times daily as needed for anxiety.   AMBULATORY NON FORMULARY MEDICATION GI Cocktail - 90 mls of 2% Lidocaine                      90 mls Dicyclomine 10mg /75ml                    270 mls Maalox Take 5-10 ml every 4-6 hours as needed   amLODipine 10 MG tablet Commonly known as: NORVASC Take 1 tablet (10 mg total) by mouth daily.   aspirin 81 MG tablet Take 81 mg by mouth daily.   atorvastatin 40 MG tablet Commonly known as: LIPITOR Take 1 tablet (40 mg total) by mouth at bedtime.   Azelastine HCl 137 MCG/SPRAY Soln Place 2 sprays into the nose 2 (two)  times daily.   diclofenac sodium 1 % Gel Commonly known as: VOLTAREN Apply 4 g topically 3 (three) times daily as needed.   dicyclomine 20 MG tablet Commonly known as: BENTYL Take 1 tablet (20 mg total) by mouth 3 (three) times daily before meals.   escitalopram 10 MG tablet Commonly known as: LEXAPRO Take 1 tablet (10 mg total) by mouth daily.   famotidine 20 MG tablet Commonly known as: PEPCID Take 1 tablet (20 mg total) by mouth 2 (two) times daily before a meal.   FDgard 25-20.75 MG Caps Generic drug: Caraway Oil-Levomenthol Take 2 tablets by mouth in the morning and at bedtime.   Flaxseed Oil 1000 MG Caps Take 2,000 mg by mouth daily.   hydrochlorothiazide 25 MG tablet Commonly known as: HYDRODIURIL Take 1 tablet (25 mg total) by mouth daily.   hydrocortisone 2.5 % cream Apply topically 2 (two) times daily.   metoprolol tartrate 50 MG tablet Commonly known as: LOPRESSOR Take 1 tablet by mouth  2 hours prior to Cardiac CT   multivitamin tablet Take 1 tablet by mouth daily.   omeprazole 20 MG tablet Commonly known as: PRILOSEC OTC Take 20 mg by mouth daily.   sildenafil 20 MG tablet Commonly known as: REVATIO TAKE 3 TO 4 TABLETS BY MOUTH AT BEDTIME AS NEEDED. What changed: See the new instructions.   sitaGLIPtin 100 MG tablet Commonly known as: Januvia Take 1 tablet (100 mg total) by mouth daily.          Objective:   Physical Exam BP (!) 145/56 (BP Location: Left Arm, Patient Position: Sitting, Cuff Size: Normal)   Pulse (!) 42   Temp (!) 96.4 F (35.8 C) (Temporal)   Resp 18   Ht 5\' 5"  (1.651 m)   Wt 204 lb 8 oz (92.8 kg)   SpO2 98%   BMI 34.03 kg/m  General:   Well developed, NAD, BMI noted.  HEENT:  Normocephalic . Face symmetric, atraumatic  Abdomen:  Not distended, soft, non-tender. No rebound or rigidity.   Skin: Not pale. Not jaundice Lower extremities: no pretibial edema bilaterally  Neurologic:  alert & oriented X3.  Speech  normal, gait appropriate for age and unassisted Psych--  Cognition and judgment appear intact.  Cooperative with normal attention span and concentration.  Behavior appropriate. No anxious or depressed appearing.     Assessment    ASSESSMENT DM (pre-DM until 2016).  Intolerant to Metformin, see office visit 09/09/2019 HTN Polycythemia- sees hematology, likely d/t OSA-diuretics, JAK2 (-) ~ 2014, last OV 10-2014, f/u prn OSA on CPAP DJD- saw Dr Rhona Raider before  R [place] drop  GI: -GERD - IBS? GI sx on and off, previously dx w/ IBS -Cscopes: [id number], 09/18/2017 - EGD 04/2018, showed gastritis, no H. pylori, no malignancy Urolithiasis, several episodes   PLAN: Anxiety: Since the last office visit, he continue with Lexapro and is feeling great, symptoms well controlled.  Hardly ever uses Lexapro. GERD, IBS: Since the last visit he stopped Metformin,  states that it make a huge difference.  He is feeling great with essentially no GI symptoms. He does take PPIs and his insurance has notified him they will stop paying for omeprazole 40 mg daily.  We agreed to switch to omeprazole 20 mg OTC for 3 to 4 weeks then   stop PPIs and continue with Pepcid.  H2 blocker might be the only thing he needs. DM: Off Metformin, see above comments, only on Januvia, CBGs are very good in the 89-100 area. Chest pain: Resolved, just had calcium coronary score, no significant disease. RTC 4 months    This visit occurred during the SARS-CoV-2 public health emergency.  Safety protocols were in place, including screening questions prior to the visit, additional usage of staff PPE, and extensive cleaning of exam room while observing appropriate contact time as indicated for disinfecting solutions.

## 2019-09-10 NOTE — Assessment & Plan Note (Signed)
Anxiety: Since the last office visit, he continue with Lexapro and is feeling great, symptoms well controlled.  Hardly ever uses Lexapro. GERD, IBS: Since the last visit he stopped Metformin,  states that it make a huge difference.  He is feeling great with essentially no GI symptoms. He does take PPIs and his insurance has notified him they will stop paying for omeprazole 40 mg daily.  We agreed to switch to omeprazole 20 mg OTC for 3 to 4 weeks then   stop PPIs and continue with Pepcid.  H2 blocker might be the only thing he needs. DM: Off Metformin, see above comments, only on Januvia, CBGs are very good in the 89-100 area. Chest pain: Resolved, just had calcium coronary score, no significant disease. RTC 4 months

## 2019-09-16 ENCOUNTER — Encounter: Payer: Self-pay | Admitting: Gastroenterology

## 2019-09-16 ENCOUNTER — Ambulatory Visit: Payer: BC Managed Care – PPO | Admitting: Gastroenterology

## 2019-09-16 VITALS — BP 130/70 | HR 64 | Ht 65.0 in | Wt 208.0 lb

## 2019-09-16 DIAGNOSIS — R0789 Other chest pain: Secondary | ICD-10-CM | POA: Diagnosis not present

## 2019-09-16 NOTE — Progress Notes (Signed)
Review of pertinent gastrointestinal problems: 1. Elevated risk for colon cancer given FH of colon cancer (sister diagnosed in her 33s).  Colonoscopy 2014 no polyps. Colonoscopy 09/2017 Dr. Ardis Hughs + diverticulosis, no polyps, recommended recall at 5 years. 2.  Dyspepsia and chest discomforts led to upper endoscopy January 2020.  EGD showed mild gastritis.  Biopsies were negative for H. Pylori.  HPI: This is a very pleasant 74 year old man  He was here in our office in February 2021, also March 2021 for unusual chest discomforts.  Anxiety seemed to be playing some role.  Repeated cardiology testing showed no clear cardiac cause.   He has found out since then that anxiety was clearly playing a role.  He was having panic attacks.  He has talked with a therapist twice since then and is working on his anxiety.  He has started taking a Lexapro at bedtime every night.  He believes this is really helping him.  He was also having some bloating and lower chest discomforts and when he stopped taking Metformin those symptoms completely improved as well.    ROS: complete GI ROS as described in HPI, all other review negative.  Constitutional:  No unintentional weight loss   Past Medical History:  Diagnosis Date  . Arthritis    hands right  . Diabetes mellitus without complication ([place])   . GERD (gastroesophageal reflux disease)   . Hypertension   . Kidney stones    several episodes.  Last one in 2011.    . Obesity    100-lb weight loss since 2011.   . Polycythemia   . Sleep apnea    on Cpap    Past Surgical History:  Procedure Laterality Date  . CHOLECYSTECTOMY    . HAND SURGERY  2010   R hand , d/t a injury, 3 surgeries     Current Outpatient Medications  Medication Sig Dispense Refill  . ALPRAZolam (XANAX) 0.5 MG tablet Take 0.5-1 tablets (0.25-0.5 mg total) by mouth 2 (two) times daily as needed for anxiety. 30 tablet 0  . AMBULATORY NON FORMULARY MEDICATION GI Cocktail - 90 mls of  2% Lidocaine                      90 mls Dicyclomine 10mg /49ml                    270 mls Maalox Take 5-10 ml every 4-6 hours as needed 120 mL 0  . amLODipine (NORVASC) 10 MG tablet Take 1 tablet (10 mg total) by mouth daily. 90 tablet 1  . aspirin 81 MG tablet Take 81 mg by mouth daily.    Marland Kitchen atorvastatin (LIPITOR) 40 MG tablet Take 1 tablet (40 mg total) by mouth at bedtime. 90 tablet 1  . Azelastine HCl 137 MCG/SPRAY SOLN Place 2 sprays into the nose 2 (two) times daily. 30 mL 6  . Caraway Oil-Levomenthol (FDGARD) 25-20.75 MG CAPS Take 2 tablets by mouth in the morning and at bedtime.     . diclofenac sodium (VOLTAREN) 1 % GEL Apply 4 g topically 3 (three) times daily as needed. 100 g 3  . dicyclomine (BENTYL) 20 MG tablet Take 1 tablet (20 mg total) by mouth 3 (three) times daily before meals. 270 tablet 3  . escitalopram (LEXAPRO) 10 MG tablet Take 1 tablet (10 mg total) by mouth daily. 30 tablet 3  . famotidine (PEPCID) 20 MG tablet Take 1 tablet (20 mg total) by mouth 2 (  two) times daily before a meal. 180 tablet 3  . Flaxseed, Linseed, (FLAXSEED OIL) 1000 MG CAPS Take 2,000 mg by mouth daily.     . hydrochlorothiazide (HYDRODIURIL) 25 MG tablet Take 1 tablet (25 mg total) by mouth daily. 90 tablet 3  . hydrocortisone 2.5 % cream Apply topically 2 (two) times daily. 60 g 1  . metoprolol tartrate (LOPRESSOR) 50 MG tablet Take 1 tablet by mouth 2 hours prior to Cardiac CT 1 tablet 0  . Multiple Vitamin (MULTIVITAMIN) tablet Take 1 tablet by mouth daily.     Marland Kitchen omeprazole (PRILOSEC OTC) 20 MG tablet Take 20 mg by mouth daily.    . sildenafil (REVATIO) 20 MG tablet TAKE 3 TO 4 TABLETS BY MOUTH AT BEDTIME AS NEEDED. (Patient taking differently: Take 60-80 mg by mouth at bedtime as needed. ) 30 tablet 3  . sitaGLIPtin (JANUVIA) 100 MG tablet Take 1 tablet (100 mg total) by mouth daily. 30 tablet 5   No current facility-administered medications for this visit.    Allergies as of 09/16/2019 -  Review Complete 09/16/2019  Allergen Reaction Noted  . Metformin and related  09/10/2019  . Sulfa antibiotics Nausea And Vomiting 02/23/2012    Family History  Problem Relation Age of Onset  . Diabetes Mother   . Coronary artery disease Mother   . Stroke Father        M and F  . Hypertension Father   . Heart disease Sister        ?  Marland Kitchen Alcohol abuse Sister   . Colon cancer Sister 27       age ~ 74  . Prostate cancer Neg Hx   . Stomach cancer Neg Hx   . Rectal cancer Neg Hx   . Esophageal cancer Neg Hx     Social History   Socioeconomic History  . Marital status: Married    Spouse name: Not on file  . Number of children: 3   . Years of education: Not on file  . Highest education level: Not on file  Occupational History  . Occupation: LANDSCAPING  Tobacco Use  . Smoking status: Never Smoker  . Smokeless tobacco: Never Used  Substance and Sexual Activity  . Alcohol use: No    Comment: none for 15 years  . Drug use: No  . Sexual activity: Not Currently  Other Topics Concern  . Not on file  Social History Narrative   1ST WIFE DIED FROM LUNG CANCER, 2ND WIFE DIED FROM RENAL CANCER.     Married x 4 , lives w/ wife   3 sisters and 3 brother : lost 2 sisters    Social Determinants of Radio broadcast assistant Strain:   . Difficulty of Paying Living Expenses:   Food Insecurity:   . Worried About Charity fundraiser in the Last Year:   . Arboriculturist in the Last Year:   Transportation Needs:   . Film/video editor (Medical):   Marland Kitchen Lack of Transportation (Non-Medical):   Physical Activity:   . Days of Exercise per Week:   . Minutes of Exercise per Session:   Stress:   . Feeling of Stress :   Social Connections:   . Frequency of Communication with Friends and Family:   . Frequency of Social Gatherings with Friends and Family:   . Attends Religious Services:   . Active Member of Clubs or Organizations:   . Attends Archivist Meetings:   .  Marital Status:   Intimate Partner Violence:   . Fear of Current or Ex-Partner:   . Emotionally Abused:   Marland Kitchen Physically Abused:   . Sexually Abused:      Physical Exam: Wt 208 lb (94.3 kg)   BMI 34.61 kg/m  Constitutional: generally well-appearing Psychiatric: alert and oriented x3 Abdomen: soft, nontender, nondistended, no obvious ascites, no peritoneal signs, normal bowel sounds No peripheral edema noted in lower extremities  Assessment and plan: 74 y.o. male with panic attacks, chest discomforts  I am happy that he is feeling so much better.  It looks like his symptoms were probably related to panic attacks and also side effects from Metformin.    He knows to call if he has any further questions or concerns.  Please see the "Patient Instructions" section for addition details about the plan.  Owens Loffler, MD [facility] 09/16/2019, 3:32 PM   Total time on date of encounter was 15 minutes (this included time spent preparing to see the patient reviewing records; obtaining and/or reviewing separately obtained history; performing a medically appropriate exam and/or evaluation; counseling and educating the patient and family if present; ordering medications, tests or procedures if applicable; and documenting clinical information in the health record).

## 2019-09-16 NOTE — Patient Instructions (Signed)
If you are age 72 or older, your body mass index should be between 23-30. Your Body mass index is 34.61 kg/m. If this is out of the aforementioned range listed, please consider follow up with your Primary Care Provider.  If you are age 54 or younger, your body mass index should be between 19-25. Your Body mass index is 34.61 kg/m. If this is out of the aformentioned range listed, please consider follow up with your Primary Care Provider.   You will follow up with our office on an as needed basis or if symptoms worsen or fail to improve.  Thank you for entrusting me with your care and choosing [facility].  Dr Ardis Hughs

## 2019-09-18 ENCOUNTER — Other Ambulatory Visit: Payer: Self-pay | Admitting: Internal Medicine

## 2019-09-26 ENCOUNTER — Ambulatory Visit (INDEPENDENT_AMBULATORY_CARE_PROVIDER_SITE_OTHER): Payer: BC Managed Care – PPO | Admitting: Psychologist

## 2019-09-26 DIAGNOSIS — F411 Generalized anxiety disorder: Secondary | ICD-10-CM

## 2019-09-26 DIAGNOSIS — F410 Panic disorder [episodic paroxysmal anxiety] without agoraphobia: Secondary | ICD-10-CM | POA: Diagnosis not present

## 2019-10-22 DIAGNOSIS — H40033 Anatomical narrow angle, bilateral: Secondary | ICD-10-CM | POA: Diagnosis not present

## 2019-10-22 DIAGNOSIS — E119 Type 2 diabetes mellitus without complications: Secondary | ICD-10-CM | POA: Diagnosis not present

## 2019-10-23 ENCOUNTER — Encounter: Payer: Self-pay | Admitting: Internal Medicine

## 2019-11-17 ENCOUNTER — Other Ambulatory Visit: Payer: Self-pay | Admitting: Internal Medicine

## 2019-12-03 ENCOUNTER — Other Ambulatory Visit: Payer: Self-pay | Admitting: Internal Medicine

## 2020-01-05 ENCOUNTER — Other Ambulatory Visit: Payer: Self-pay | Admitting: Internal Medicine

## 2020-01-12 ENCOUNTER — Ambulatory Visit: Payer: BC Managed Care – PPO | Admitting: Internal Medicine

## 2020-01-22 ENCOUNTER — Ambulatory Visit: Payer: BC Managed Care – PPO | Admitting: Internal Medicine

## 2020-01-23 ENCOUNTER — Ambulatory Visit (INDEPENDENT_AMBULATORY_CARE_PROVIDER_SITE_OTHER): Payer: Medicare Other | Admitting: Internal Medicine

## 2020-01-23 ENCOUNTER — Other Ambulatory Visit: Payer: Self-pay

## 2020-01-23 ENCOUNTER — Encounter: Payer: Self-pay | Admitting: Internal Medicine

## 2020-01-23 VITALS — BP 153/77 | HR 59 | Temp 97.8°F | Resp 18 | Ht 65.0 in | Wt 224.4 lb

## 2020-01-23 DIAGNOSIS — E119 Type 2 diabetes mellitus without complications: Secondary | ICD-10-CM

## 2020-01-23 DIAGNOSIS — R7989 Other specified abnormal findings of blood chemistry: Secondary | ICD-10-CM

## 2020-01-23 DIAGNOSIS — Z23 Encounter for immunization: Secondary | ICD-10-CM

## 2020-01-23 DIAGNOSIS — I10 Essential (primary) hypertension: Secondary | ICD-10-CM

## 2020-01-23 MED ORDER — LOSARTAN POTASSIUM 50 MG PO TABS: 50.0000 mg | ORAL_TABLET | Freq: Every day | ORAL | 1 refills | Status: DC

## 2020-01-23 NOTE — Progress Notes (Signed)
Pre visit review using our clinic review tool, if applicable. No additional management support is needed unless otherwise documented below in the visit note. 

## 2020-01-23 NOTE — Progress Notes (Signed)
Subjective:    Patient ID: RIDER ERMIS, male    DOB: [birth year], 74 y.o.   MRN: [id number]  DOS:  01/23/2020 Type of visit - description: Routine visit In general feeling well. Stress decrease due to recent retirement Ambulatory BPs are elevated. Had a CT coronary score, results reviewed. Reports lower extremity edema, was very significant during the summer, now slightly better, still have some pedal edema. We talk about his ambulatory BPs and CBGs.  BP Readings from Last 3 Encounters:  01/23/20 (!) 153/77  09/16/19 130/70  09/09/19 (!) 145/56    Review of Systems Denies chest pain or difficulty breathing  Past Medical History:  Diagnosis Date  . Arthritis    hands right  . Diabetes mellitus without complication ([place])   . GERD (gastroesophageal reflux disease)   . Hypertension   . Kidney stones    several episodes.  Last one in 2011.    . Obesity    100-lb weight loss since 2011.   . Polycythemia   . Sleep apnea    on Cpap    Past Surgical History:  Procedure Laterality Date  . CHOLECYSTECTOMY    . HAND SURGERY  2010   R hand , d/t a injury, 3 surgeries     Allergies as of 01/23/2020      Reactions   Metformin And Related    Intolerant, GI symptoms   Sulfa Antibiotics Nausea And Vomiting      Medication List       Accurate as of January 23, 2020 11:59 PM. If you have any questions, ask your nurse or doctor.        STOP taking these medications   metoprolol tartrate 50 MG tablet Commonly known as: LOPRESSOR Stopped by: Kathlene November, MD     TAKE these medications   ALPRAZolam 0.5 MG tablet Commonly known as: Xanax Take 0.5-1 tablets (0.25-0.5 mg total) by mouth 2 (two) times daily as needed for anxiety.   AMBULATORY NON FORMULARY MEDICATION GI Cocktail - 90 mls of 2% Lidocaine                      90 mls Dicyclomine 10mg /34ml                    270 mls Maalox Take 5-10 ml every 4-6 hours as needed   amLODipine 10 MG tablet Commonly known as:  NORVASC Take 1 tablet (10 mg total) by mouth daily.   aspirin EC 81 MG tablet Take 81 mg by mouth daily. Swallow whole. What changed: Another medication with the same name was removed. Continue taking this medication, and follow the directions you see here. Changed by: Kathlene November, MD   atorvastatin 40 MG tablet Commonly known as: LIPITOR Take 1 tablet (40 mg total) by mouth at bedtime.   Azelastine HCl 137 MCG/SPRAY Soln Place 2 sprays into the nose 2 (two) times daily.   diclofenac sodium 1 % Gel Commonly known as: VOLTAREN Apply 4 g topically 3 (three) times daily as needed.   dicyclomine 20 MG tablet Commonly known as: BENTYL Take 1 tablet (20 mg total) by mouth 3 (three) times daily before meals.   escitalopram 10 MG tablet Commonly known as: LEXAPRO Take 1 tablet (10 mg total) by mouth daily.   famotidine 20 MG tablet Commonly known as: PEPCID Take 1 tablet (20 mg total) by mouth 2 (two) times daily before a meal.   FDgard 25-20.75 MG  Caps Generic drug: Caraway Oil-Levomenthol Take 2 tablets by mouth in the morning and at bedtime.   Flaxseed Oil 1000 MG Caps Take 2,000 mg by mouth daily.   hydrochlorothiazide 25 MG tablet Commonly known as: HYDRODIURIL Take 1 tablet (25 mg total) by mouth daily.   hydrocortisone 2.5 % cream Apply topically 2 (two) times daily.   losartan 50 MG tablet Commonly known as: COZAAR Take 1 tablet (50 mg total) by mouth daily. Started by: Kathlene November, MD   multivitamin tablet Take 1 tablet by mouth daily.   omeprazole 20 MG tablet Commonly known as: PRILOSEC OTC Take 20 mg by mouth daily.   sildenafil 20 MG tablet Commonly known as: REVATIO TAKE 3 TO 4 TABLETS BY MOUTH AT BEDTIME AS NEEDED. What changed: See the new instructions.   sitaGLIPtin 100 MG tablet Commonly known as: Januvia Take 1 tablet (100 mg total) by mouth daily.          Objective:   Physical Exam BP (!) 153/77 (BP Location: Left Arm, Patient Position:  Sitting, Cuff Size: Normal)   Pulse (!) 59   Temp 97.8 F (36.6 C) (Oral)   Resp 18   Ht 5\' 5"  (1.651 m)   Wt 224 lb 6 oz (101.8 kg)   SpO2 97%   BMI 37.34 kg/m  General:   Well developed, NAD, BMI noted. HEENT:  Normocephalic . Face symmetric, atraumatic Lungs:  CTA B Normal respiratory effort, no intercostal retractions, no accessory muscle use. Heart: RRR,  no murmur.  DM foot exam: Mild pedal edema, worse on the right. Dystrophic nails Pedal pulses present Pinprick examination normal Skin: Not pale. Not jaundice Neurologic:  alert & oriented X3.  Speech normal, gait appropriate for age and unassisted Psych--  Cognition and judgment appear intact.  Cooperative with normal attention span and concentration.  Behavior appropriate. No anxious or depressed appearing.      Assessment      ASSESSMENT DM (pre-DM until 2016).  Intolerant to Metformin, see office visit 09/09/2019 HTN Polycythemia- sees hematology, likely d/t OSA-diuretics, JAK2 (-) ~ 2014, last OV 10-2014, f/u prn OSA on CPAP DJD- saw Dr Rhona Raider before  R Foot drop  GI: -GERD - IBS? GI sx on and off, previously dx w/ IBS -Cscopes: [id number], 09/18/2017 - EGD 04/2018, showed gastritis, no H. pylori, no malignancy Urolithiasis, several episodes Negative Myoview 2019, CT coronary angiography 08/2019: Minimal disease  PLAN: DM: On Januvia, CBGs in the low 100s, check A1c.  Feet exam>> no neuropathy HTN: BP today in the 150s, at home is consistently in the 150s as well.  Mild pedal edema noted. Continue amlodipine, HCTZ, add losartan 50 mg, BMP in 2 weeks.  Monitor BPs. Consider DC amlodipine at some point if edema severe (and increase losartan). Decreased TSH: Recheck Aspirin: Has DM, HTN and minimal CAD by coronary angiography recently: Continue aspirin. Flu shot today RTC 2 weeks blood work RTC CPX 04-2020    This visit occurred during the SARS-CoV-2 public health emergency.  Safety protocols were  in place, including screening questions prior to the visit, additional usage of staff PPE, and extensive cleaning of exam room while observing appropriate contact time as indicated for disinfecting solutions.

## 2020-01-23 NOTE — Patient Instructions (Addendum)
Per our records you are due for an eye exam. Please contact your eye doctor to schedule an appointment. Please have them send copies of your office visit notes to [place]. Our fax number is [phone].  Add a medication called losartan 50 mg 1 every night  Check the  blood pressure every other day BP GOAL is between 110/65 and  135/85. If it is consistently higher or lower, let me know   [place], [place] back for blood work only in 2 weeks     Come back for a physical exam by 04-2020

## 2020-01-24 NOTE — Assessment & Plan Note (Signed)
DM: On Januvia, CBGs in the low 100s, check A1c.  Feet exam>> no neuropathy HTN: BP today in the 150s, at home is consistently in the 150s as well.  Mild pedal edema noted. Continue amlodipine, HCTZ, add losartan 50 mg, BMP in 2 weeks.  Monitor BPs. Consider DC amlodipine at some point if edema severe (and increase losartan). Decreased TSH: Recheck Aspirin: Has DM, HTN and minimal CAD by coronary angiography recently: Continue aspirin. Flu shot today RTC 2 weeks blood work RTC CPX 04-2020

## 2020-02-06 ENCOUNTER — Other Ambulatory Visit: Payer: Self-pay

## 2020-02-06 ENCOUNTER — Other Ambulatory Visit (INDEPENDENT_AMBULATORY_CARE_PROVIDER_SITE_OTHER): Payer: Medicare Other

## 2020-02-06 DIAGNOSIS — E119 Type 2 diabetes mellitus without complications: Secondary | ICD-10-CM | POA: Diagnosis not present

## 2020-02-06 DIAGNOSIS — I10 Essential (primary) hypertension: Secondary | ICD-10-CM | POA: Diagnosis not present

## 2020-02-06 DIAGNOSIS — E0590 Thyrotoxicosis, unspecified without thyrotoxic crisis or storm: Secondary | ICD-10-CM

## 2020-02-06 DIAGNOSIS — R7989 Other specified abnormal findings of blood chemistry: Secondary | ICD-10-CM

## 2020-02-07 LAB — BASIC METABOLIC PANEL
BUN/Creatinine Ratio: 16 (calc) (ref 6–22)
BUN: 20 mg/dL (ref 7–25)
CO2: 29 mmol/L (ref 20–32)
Calcium: 9.9 mg/dL (ref 8.6–10.3)
Chloride: 103 mmol/L (ref 98–110)
Creat: 1.27 mg/dL — ABNORMAL HIGH (ref 0.70–1.18)
Glucose, Bld: 130 mg/dL — ABNORMAL HIGH (ref 65–99)
Potassium: 4.5 mmol/L (ref 3.5–5.3)
Sodium: 140 mmol/L (ref 135–146)

## 2020-02-07 LAB — HEMOGLOBIN A1C
Hgb A1c MFr Bld: 6.4 % of total Hgb — ABNORMAL HIGH (ref <5.7)
Mean Plasma Glucose: 137 (calc)
eAG (mmol/L): 7.6 (calc)

## 2020-02-07 LAB — TSH: TSH: 0.39 mIU/L — ABNORMAL LOW (ref 0.40–4.50)

## 2020-02-07 LAB — T3, FREE: T3, Free: 3.3 pg/mL (ref 2.3–4.2)

## 2020-02-07 LAB — T4, FREE: Free T4: 1.1 ng/dL (ref 0.8–1.8)

## 2020-02-09 NOTE — Addendum Note (Signed)
Addended byDamita Dunnings D on: 02/09/2020 08:26 AM   Modules accepted: Orders

## 2020-02-18 ENCOUNTER — Other Ambulatory Visit: Payer: Self-pay | Admitting: Internal Medicine

## 2020-02-27 ENCOUNTER — Encounter: Payer: Self-pay | Admitting: Internal Medicine

## 2020-02-27 ENCOUNTER — Other Ambulatory Visit: Payer: Self-pay | Admitting: Internal Medicine

## 2020-03-09 ENCOUNTER — Ambulatory Visit: Payer: Medicare Other | Admitting: Internal Medicine

## 2020-03-10 LAB — HM DIABETES FOOT EXAM: HM Diabetic Foot Exam: NORMAL

## 2020-03-10 LAB — HEMOGLOBIN A1C: Hemoglobin A1C: 5.9

## 2020-03-13 ENCOUNTER — Encounter: Payer: Self-pay | Admitting: *Deleted

## 2020-03-13 ENCOUNTER — Ambulatory Visit
Admission: EM | Admit: 2020-03-13 | Discharge: 2020-03-13 | Disposition: A | Discharge status: Home / Self Care | Payer: Medicare Other | Attending: Emergency Medicine | Admitting: Emergency Medicine

## 2020-03-13 ENCOUNTER — Ambulatory Visit (INDEPENDENT_AMBULATORY_CARE_PROVIDER_SITE_OTHER): Payer: Medicare Other

## 2020-03-13 ENCOUNTER — Other Ambulatory Visit: Payer: Self-pay

## 2020-03-13 DIAGNOSIS — Z20822 Contact with and (suspected) exposure to covid-19: Secondary | ICD-10-CM

## 2020-03-13 DIAGNOSIS — J069 Acute upper respiratory infection, unspecified: Secondary | ICD-10-CM | POA: Diagnosis not present

## 2020-03-13 DIAGNOSIS — R059 Cough, unspecified: Secondary | ICD-10-CM | POA: Diagnosis not present

## 2020-03-13 DIAGNOSIS — I700 Atherosclerosis of aorta: Secondary | ICD-10-CM | POA: Diagnosis not present

## 2020-03-13 HISTORY — DX: Anxiety disorder, unspecified: F41.9

## 2020-03-13 MED ORDER — BENZONATATE 100 MG PO CAPS: 100.0000 mg | ORAL_CAPSULE | Freq: Three times a day (TID) | ORAL | 0 refills | Status: DC

## 2020-03-13 MED ORDER — AEROCHAMBER PLUS FLO-VU MEDIUM MISC: 1.0000 | Freq: Once | 0 refills | Status: AC

## 2020-03-13 MED ORDER — ALBUTEROL SULFATE HFA 108 (90 BASE) MCG/ACT IN AERS: 2.0000 | INHALATION_SPRAY | Freq: Four times a day (QID) | RESPIRATORY_TRACT | 2 refills | Status: DC | PRN

## 2020-03-13 NOTE — ED Triage Notes (Signed)
C/O postnasal drainage, runny nose, nasal congestion, sinus pressure, cough over past week without fevers.

## 2020-03-13 NOTE — Discharge Instructions (Signed)

## 2020-03-13 NOTE — ED Provider Notes (Signed)
[facility]    CSN: [id number] Arrival date & time: 03/13/20  1123      History   Chief Complaint Chief Complaint  Patient presents with  . Nasal Congestion  . Cough    HPI Donald Jones is a 74 y.o. male    History was provided by the patient. Donald Jones is a 74 y.o. male who presents for evaluation of symptoms of a URI. Symptoms include nasal blockage, post nasal drip, productive cough, sinus and nasal congestion and sore throat. Onset of symptoms was 1 week ago, gradually worsening since that time. Associated symptoms include no  fever and abdominal & back pain from coughing.  He is drinking plenty of fluids. Evaluation to date: none. Treatment to date: cough suppressants and decongestants w/ some relief. The following portions of the patient's history were reviewed and updated as appropriate: allergies, current medications, past family history, past medical history, past social history, past surgical history and problem list.     Past Medical History:  Diagnosis Date  . Anxiety   . Arthritis    hands right  . Diabetes mellitus without complication ([place])   . GERD (gastroesophageal reflux disease)   . Hypertension   . Kidney stones    several episodes.  Last one in 2011.    . Obesity    100-lb weight loss since 2011.   . Polycythemia   . Sleep apnea    on Cpap    Patient Active Problem List   Diagnosis Date Noted  . Atypical chest pain 07/24/2019  . Anxiety 07/07/2019  . High cholesterol 01/20/2018  . DJD (degenerative joint disease) 09/06/2017  . Erectile dysfunction 09/06/2017  . PCP NOTES >>> 01/23/2015  . Abnormal TSH 10/05/2014  . Diabetes mellitus without complication ([place]) 56/81/2751  . Obesity (BMI 30-39.9) 12/03/2012  . GERD (gastroesophageal reflux disease) 09/24/2012  . Skin lesion 09/24/2012  . IBS ? 04/22/2012  . Polycythemia   . Annual physical exam 06/05/2011  . Foot drop, right 06/05/2011  . BACK PAIN 02/15/2009  .  HYPERTENSION, BENIGN SYSTEMIC 06/14/2006  . RHINITIS, ALLERGIC 06/14/2006  . NEPHROLITHIASIS 06/14/2006  . OSA (obstructive sleep apnea) 06/14/2006    Past Surgical History:  Procedure Laterality Date  . CHOLECYSTECTOMY    . HAND SURGERY  2010   R hand , d/t a injury, 3 surgeries        Home Medications    Prior to Admission medications   Medication Sig Start Date End Date Taking? Authorizing Provider  amLODipine (NORVASC) 10 MG tablet Take 1 tablet (10 mg total) by mouth daily. 01/05/20  Yes Paz, Alda Berthold, MD  atorvastatin (LIPITOR) 40 MG tablet Take 1 tablet (40 mg total) by mouth at bedtime. 09/18/19  Yes Paz, Alda Berthold, MD  escitalopram (LEXAPRO) 10 MG tablet Take 1 tablet (10 mg total) by mouth daily. 02/18/20  Yes Colon Branch, MD  famotidine (PEPCID) 20 MG tablet Take 1 tablet (20 mg total) by mouth 2 (two) times daily before a meal. 07/21/19  Yes Paz, Fromberg E, MD  Flaxseed, Linseed, (FLAXSEED OIL) 1000 MG CAPS Take 2,000 mg by mouth daily.    Yes [provider]  hydrochlorothiazide (HYDRODIURIL) 25 MG tablet Take 1 tablet (25 mg total) by mouth daily. 02/18/20  Yes Paz, Alda Berthold, MD  losartan (COZAAR) 50 MG tablet Take 1 tablet (50 mg total) by mouth daily. 01/23/20  Yes Colon Branch, MD  Multiple Vitamin (MULTIVITAMIN) tablet Take  1 tablet by mouth daily.    Yes [provider]  omeprazole (PRILOSEC OTC) 20 MG tablet Take 20 mg by mouth daily.   Yes [provider]  sildenafil (REVATIO) 20 MG tablet TAKE 3 TO 4 TABLETS BY MOUTH AT BEDTIME AS NEEDED. Patient taking differently: Take 60-80 mg by mouth at bedtime as needed.  12/30/18  Yes Paz, Alda Berthold, MD  albuterol (VENTOLIN HFA) 108 (90 Base) MCG/ACT inhaler Inhale 2 puffs into the lungs every 6 (six) hours as needed for wheezing or shortness of breath. 03/13/20   Hall-Potvin, [place], PA-C  ALPRAZolam (XANAX) 0.5 MG tablet Take 0.5-1 tablets (0.25-0.5 mg total) by mouth 2 (two) times daily as needed for anxiety.  07/18/19   Colon Branch, MD  AMBULATORY NON FORMULARY MEDICATION GI Cocktail - 90 mls of 2% Lidocaine                      90 mls Dicyclomine 10mg /92ml                    270 mls Maalox Take 5-10 ml every 4-6 hours as needed 07/25/19   Barrett, Evelene Croon, PA-C  aspirin EC 81 MG tablet Take 81 mg by mouth daily. Swallow whole.    [provider]  Azelastine HCl 137 MCG/SPRAY SOLN Place 2 sprays into the nose 2 (two) times daily. 07/23/18   Colon Branch, MD  benzonatate (TESSALON) 100 MG capsule Take 1 capsule (100 mg total) by mouth every 8 (eight) hours. 03/13/20   Hall-Potvin, [place], PA-C  [place] Oil-Levomenthol (FDGARD) 25-20.75 MG CAPS Take 2 tablets by mouth in the morning and at bedtime.     [provider]  diclofenac sodium (VOLTAREN) 1 % GEL Apply 4 g topically 3 (three) times daily as needed. 05/01/18   Colon Branch, MD  dicyclomine (BENTYL) 20 MG tablet Take 1 tablet (20 mg total) by mouth 3 (three) times daily before meals. 05/29/19   Colon Branch, MD  hydrocortisone 2.5 % cream Apply topically 2 (two) times daily. 08/21/16   Colon Branch, MD  sitaGLIPtin (JANUVIA) 100 MG tablet Take 1 tablet (100 mg total) by mouth daily. 02/27/20   Colon Branch, MD  Spacer/Aero-Holding Chambers (AEROCHAMBER PLUS FLO-VU MEDIUM) MISC 1 each by Other route once for 1 dose. 03/13/20 03/13/20  Hall-Potvin, [place], PA-C    Family History Family History  Problem Relation Age of Onset  . Diabetes Mother   . Coronary artery disease Mother   . Stroke Father        M and F  . Hypertension Father   . Heart disease Sister        ?  Marland Kitchen Alcohol abuse Sister   . Colon cancer Sister 89       age ~ 77  . Prostate cancer Neg Hx   . Stomach cancer Neg Hx   . Rectal cancer Neg Hx   . Esophageal cancer Neg Hx     Social History Social History   Tobacco Use  . Smoking status: Never Smoker  . Smokeless tobacco: Never Used  Vaping Use  . Vaping Use: Never used  Substance Use Topics  . Alcohol  use: No    Comment: none for 15 years  . Drug use: No     Allergies   Metformin and related and Sulfa antibiotics   Review of Systems Review of Systems  Constitutional: Negative for fatigue and  fever.  HENT: Positive for congestion, postnasal drip and sore throat. Negative for dental problem, ear pain, facial swelling, hearing loss, sinus pain, trouble swallowing and voice change.   Eyes: Negative for photophobia, pain and visual disturbance.  Respiratory: Positive for cough. Negative for shortness of breath.   Cardiovascular: Negative for chest pain and palpitations.  Gastrointestinal: Negative for diarrhea and vomiting.  Musculoskeletal: Negative for arthralgias and myalgias.  Neurological: Negative for dizziness and headaches.     Physical Exam Triage Vital Signs ED Triage Vitals  Enc Vitals Group     BP 03/13/20 1141 131/68     Pulse Rate 03/13/20 1141 63     Resp 03/13/20 1141 20     Temp 03/13/20 1141 97.9 F (36.6 C)     Temp Source 03/13/20 1141 Oral     SpO2 03/13/20 1141 95 %     Weight --      Height --      Head Circumference --      Peak Flow --      Pain Score 03/13/20 1146 0     Pain Loc --      Pain Edu? --      Excl. in [place]? --    No data found.  Updated Vital Signs BP 131/68 (BP Location: Left Arm)   Pulse 63   Temp 97.9 F (36.6 C) (Oral)   Resp 20   SpO2 95%   Visual Acuity Right Eye Distance:   Left Eye Distance:   Bilateral Distance:    Right Eye Near:   Left Eye Near:    Bilateral Near:     Physical Exam Constitutional:      General: He is not in acute distress.    Appearance: He is not toxic-appearing or diaphoretic.  HENT:     Head: Normocephalic and atraumatic.     Right Ear: Tympanic membrane and ear canal normal.     Left Ear: Tympanic membrane and ear canal normal.     Mouth/Throat:     Mouth: Mucous membranes are moist.     Pharynx: Oropharynx is clear.  Eyes:     General: No scleral icterus.     Conjunctiva/sclera: Conjunctivae normal.     Pupils: Pupils are equal, round, and reactive to light.  Neck:     Comments: Trachea midline, negative JVD Cardiovascular:     Rate and Rhythm: Normal rate and regular rhythm.  Pulmonary:     Effort: Pulmonary effort is normal. No respiratory distress.     Breath sounds: No wheezing.  Musculoskeletal:     Cervical back: Neck supple. No tenderness.  Lymphadenopathy:     Cervical: No cervical adenopathy.  Skin:    Capillary Refill: Capillary refill takes less than 2 seconds.     Coloration: Skin is not jaundiced or pale.     Findings: No rash.  Neurological:     Mental Status: He is alert and oriented to person, place, and time.      UC Treatments / Results  Labs (all labs ordered are listed, but only abnormal results are displayed) Labs Reviewed  NOVEL CORONAVIRUS, NAA    EKG   Radiology DG Chest 2 View  Result Date: 03/13/2020 CLINICAL DATA:  Cough over the past week EXAM: CHEST - 2 VIEW COMPARISON:  07/24/2019 FINDINGS: Atherosclerotic calcification of the aortic arch. Low lung volumes are present, causing crowding of the pulmonary vasculature. No blunting of the costophrenic angles. No discrete airspace opacity. Thoracic  spondylosis noted. IMPRESSION: 1. Low lung volumes are present, causing crowding of the pulmonary vasculature. 2. Atherosclerosis. Electronically Signed   By: Van Clines M.D.   On: 03/13/2020 12:43    Procedures Procedures (including critical care time)  Medications Ordered in UC Medications - No data to display  Initial Impression / Assessment and Plan / UC Course  I have reviewed the triage vital signs and the nursing notes.  Pertinent labs & imaging results that were available during my care of the patient were reviewed by me and considered in my medical decision making (see chart for details).     Patient afebrile, nontoxic, with SpO2 95%.  CXR negative.  Covid PCR pending.  Patient to  quarantine until results are back.  We will treat supportively as outlined below.  Return precautions discussed, patient verbalized understanding and is agreeable to plan. Final Clinical Impressions(s) / UC Diagnoses   Final diagnoses:  Encounter for laboratory testing for COVID-19 virus  URI with cough and congestion     Discharge Instructions     Tessalon for cough. Start flonase, atrovent nasal spray for nasal congestion/drainage. You can use over the counter nasal saline rinse such as neti pot for nasal congestion. Keep hydrated, your urine should be clear to pale yellow in color. Tylenol/motrin for fever and pain. Monitor for any worsening of symptoms, chest pain, shortness of breath, wheezing, swelling of the throat, go to the emergency department for further evaluation needed.     ED Prescriptions    Medication Sig Dispense Auth. Provider   benzonatate (TESSALON) 100 MG capsule Take 1 capsule (100 mg total) by mouth every 8 (eight) hours. 21 capsule Hall-Potvin, [place], PA-C   albuterol (VENTOLIN HFA) 108 (90 Base) MCG/ACT inhaler Inhale 2 puffs into the lungs every 6 (six) hours as needed for wheezing or shortness of breath. 8 g Hall-Potvin, [place], PA-C   Spacer/Aero-Holding Chambers (AEROCHAMBER PLUS FLO-VU MEDIUM) MISC 1 each by Other route once for 1 dose. 1 each Hall-Potvin, [place], PA-C     PDMP not reviewed this encounter.   Hall-Potvin, [place], [place] 03/13/20 1251

## 2020-03-14 LAB — SARS-COV-2, NAA 2 DAY TAT

## 2020-03-14 LAB — NOVEL CORONAVIRUS, NAA: SARS-CoV-2, NAA: NOT DETECTED

## 2020-03-16 ENCOUNTER — Other Ambulatory Visit: Payer: Self-pay

## 2020-03-16 ENCOUNTER — Telehealth (INDEPENDENT_AMBULATORY_CARE_PROVIDER_SITE_OTHER): Payer: Medicare Other | Admitting: Internal Medicine

## 2020-03-16 ENCOUNTER — Encounter: Payer: Self-pay | Admitting: Internal Medicine

## 2020-03-16 VITALS — Ht 65.0 in

## 2020-03-16 DIAGNOSIS — J40 Bronchitis, not specified as acute or chronic: Secondary | ICD-10-CM

## 2020-03-16 MED ORDER — AZITHROMYCIN 250 MG PO TABS: ORAL_TABLET | ORAL | 0 refills | Status: DC

## 2020-03-16 NOTE — Progress Notes (Signed)
Subjective:    Patient ID: Donald Jones, male    DOB: 1945-10-09, 74 y.o.   MRN: [id number]  DOS:  03/16/2020 Type of visit - description: Virtual Visit via Video Note  I connected with the above patient  by a video enabled telemedicine application and verified that I am speaking with the correct person using two identifiers.   THIS ENCOUNTER IS A VIRTUAL VISIT DUE TO COVID-19 - PATIENT WAS NOT SEEN IN THE OFFICE. PATIENT HAS CONSENTED TO VIRTUAL VISIT / TELEMEDICINE VISIT   Location of patient: home  Location of provider: office  Persons participating in the virtual visit: patient, provider   I discussed the limitations of evaluation and management by telemedicine and the availability of in person appointments. The patient expressed understanding and agreed to proceed.    Acute Symptoms started 6 days ago with sore throat, cough with some off-color sputum. Went to urgent care 2 days ago with above symptoms, chest x-ray was negative, PCR Covid testing negative as well. He was prescribed albuterol   which is helping some but the patient remains concerned because cough is not completely gone and he is still bringing up some sputum.  Denies fever, chest pain or difficulty breathing No myalgias per se No nausea or vomiting.   Review of Systems See above   Past Medical History:  Diagnosis Date  . Anxiety   . Arthritis    hands right  . Diabetes mellitus without complication ([place])   . GERD (gastroesophageal reflux disease)   . Hypertension   . Kidney stones    several episodes.  Last one in 2011.    . Obesity    100-lb weight loss since 2011.   . Polycythemia   . Sleep apnea    on Cpap    Past Surgical History:  Procedure Laterality Date  . CHOLECYSTECTOMY    . HAND SURGERY  2010   R hand , d/t a injury, 3 surgeries     Allergies as of 03/16/2020      Reactions   Metformin And Related    Intolerant, GI symptoms   Sulfa Antibiotics Nausea And Vomiting       Medication List       Accurate as of March 16, 2020 11:59 PM. If you have any questions, ask your nurse or doctor.        albuterol 108 (90 Base) MCG/ACT inhaler Commonly known as: VENTOLIN HFA Inhale 2 puffs into the lungs every 6 (six) hours as needed for wheezing or shortness of breath.   ALPRAZolam 0.5 MG tablet Commonly known as: Xanax Take 0.5-1 tablets (0.25-0.5 mg total) by mouth 2 (two) times daily as needed for anxiety.   AMBULATORY NON FORMULARY MEDICATION GI Cocktail - 90 mls of 2% Lidocaine                      90 mls Dicyclomine 10mg /52ml                    270 mls Maalox Take 5-10 ml every 4-6 hours as needed   amLODipine 10 MG tablet Commonly known as: NORVASC Take 1 tablet (10 mg total) by mouth daily.   aspirin EC 81 MG tablet Take 81 mg by mouth daily. Swallow whole.   atorvastatin 40 MG tablet Commonly known as: LIPITOR Take 1 tablet (40 mg total) by mouth at bedtime.   Azelastine HCl 137 MCG/SPRAY Soln Place 2 sprays into the nose  2 (two) times daily.   azithromycin 250 MG tablet Commonly known as: Zithromax Z-Pak 2 tabs a day the first day, then 1 tab a day x 4 days Started by: Kathlene November, MD   benzonatate 100 MG capsule Commonly known as: TESSALON Take 1 capsule (100 mg total) by mouth every 8 (eight) hours.   diclofenac sodium 1 % Gel Commonly known as: VOLTAREN Apply 4 g topically 3 (three) times daily as needed.   dicyclomine 20 MG tablet Commonly known as: BENTYL Take 1 tablet (20 mg total) by mouth 3 (three) times daily before meals.   escitalopram 10 MG tablet Commonly known as: LEXAPRO Take 1 tablet (10 mg total) by mouth daily.   famotidine 20 MG tablet Commonly known as: PEPCID Take 1 tablet (20 mg total) by mouth 2 (two) times daily before a meal.   FDgard 25-20.75 MG Caps Generic drug: [place] Oil-Levomenthol Take 2 tablets by mouth in the morning and at bedtime.   Flaxseed Oil 1000 MG Caps Take 2,000 mg by mouth  daily.   hydrochlorothiazide 25 MG tablet Commonly known as: HYDRODIURIL Take 1 tablet (25 mg total) by mouth daily.   hydrocortisone 2.5 % cream Apply topically 2 (two) times daily.   losartan 50 MG tablet Commonly known as: COZAAR Take 1 tablet (50 mg total) by mouth daily.   multivitamin tablet Take 1 tablet by mouth daily.   omeprazole 20 MG tablet Commonly known as: PRILOSEC OTC Take 20 mg by mouth daily.   sildenafil 20 MG tablet Commonly known as: REVATIO TAKE 3 TO 4 TABLETS BY MOUTH AT BEDTIME AS NEEDED. What changed: See the new instructions.   sitaGLIPtin 100 MG tablet Commonly known as: Januvia Take 1 tablet (100 mg total) by mouth daily.          Objective:   Physical Exam Ht 5\' 5"  (1.651 m)   BMI 37.34 kg/m  This is a video protocol the patient sounded well, alert oriented x3, in no distress.  No vital signs available.     Assessment       ASSESSMENT DM (pre-DM until 2016).  Intolerant to Metformin, see office visit 09/09/2019 HTN Polycythemia- sees hematology, likely d/t OSA-diuretics, JAK2 (-) ~ 2014, last OV 10-2014, f/u prn OSA on CPAP DJD- saw Dr Rhona Raider before  R Foot drop  GI: -GERD - IBS? GI sx on and off, previously dx w/ IBS -Cscopes: 3235,5732, 09/18/2017 - EGD 04/2018, showed gastritis, no H. pylori, no malignancy Urolithiasis, several episodes Negative Myoview 2019, CT coronary angiography 08/2019: Minimal disease  PLAN: URI/bronchitis: Symptoms as described above, went to urgent care: Covid test negative.  Chest x-ray was negative The patient suspect bronchitis and that is certainly possible. We agreed on the following: Continue albuterol, Tessalon Perles, add Zithromax, also Robitussin or Robitussin-DM. Call in few days if he is not better.  He verbalized understanding   I discussed the assessment and treatment plan with the patient. The patient was provided an opportunity to ask questions and all were answered. The patient  agreed with the plan and demonstrated an understanding of the instructions.   The patient was advised to call back or seek an in-person evaluation if the symptoms worsen or if the condition fails to improve as anticipated.

## 2020-03-17 ENCOUNTER — Other Ambulatory Visit: Payer: Self-pay | Admitting: Internal Medicine

## 2020-03-17 NOTE — Assessment & Plan Note (Signed)
URI/bronchitis: Symptoms as described above, went to urgent care: Covid test negative.  Chest x-ray was negative The patient suspect bronchitis and that is certainly possible. We agreed on the following: Continue albuterol, Tessalon Perles, add Zithromax, also Robitussin or Robitussin-DM. Call in few days if he is not better.  He verbalized understanding

## 2020-03-20 ENCOUNTER — Other Ambulatory Visit: Payer: Self-pay | Admitting: Internal Medicine

## 2020-03-29 ENCOUNTER — Ambulatory Visit: Payer: BC Managed Care – PPO | Admitting: Adult Health

## 2020-03-29 ENCOUNTER — Other Ambulatory Visit: Payer: Self-pay | Admitting: Internal Medicine

## 2020-03-30 ENCOUNTER — Other Ambulatory Visit: Payer: Self-pay

## 2020-03-30 ENCOUNTER — Encounter: Payer: Self-pay | Admitting: Adult Health

## 2020-03-30 ENCOUNTER — Encounter: Payer: Self-pay | Admitting: Internal Medicine

## 2020-03-30 ENCOUNTER — Ambulatory Visit: Payer: Medicare Other | Admitting: Adult Health

## 2020-03-30 DIAGNOSIS — E669 Obesity, unspecified: Secondary | ICD-10-CM

## 2020-03-30 DIAGNOSIS — G4733 Obstructive sleep apnea (adult) (pediatric): Secondary | ICD-10-CM

## 2020-03-30 NOTE — Progress Notes (Signed)
@Patient  ID: Donald Jones, male    DOB: 02-19-1946, 74 y.o.   MRN: [id number]  Chief Complaint  Patient presents with  . Follow-up  . Sleep Apnea    Referring provider: Colon Branch, MD  HPI: 74 year old male followed for obstructive sleep apnea (diagnosed in 2003)  on nocturnal CPAP Medical history significant for hypertension, diabetes, hyperlipidemia  TEST/EVENTS :   03/30/2020 Follow up : OSA  Patient presents for a 1 year follow-up.  Patient has obstructive sleep apnea is on nocturnal CPAP.  Patient says he typically wears his CPAP every night for about 5 to 6 hours.  However over the few weeks he has been dealing with a sinus infection.  Says he has not been able to wear his CPAP as much because he has nasal congestion and postnasal drip.  He was recently seen by his primary care provider and given an antibiotic.  He says he is starting to feel better. He has felt as of late that his CPAP pressure was not strong enough.  CPAP download did show decreased compliance.  AHI was increased from last year at 11.3.  With average daily usage at 4 to 5 hours.   Allergies  Allergen Reactions  . Metformin And Related     Intolerant, GI symptoms  . Sulfa Antibiotics Nausea And Vomiting    Immunization History  Administered Date(s) Administered  . Fluad Quad(high Dose 65+) 01/10/2019, 01/23/2020  . Influenza Split 01/17/2011, 01/16/2012  . Influenza Whole 02/18/2007  . Influenza, High Dose Seasonal PF 01/22/2015, 02/03/2016, 01/29/2017, 01/09/2018  . Influenza,inj,Quad PF,6+ Mos 02/05/2013, 01/27/2014, 03/28/2019  . PFIZER SARS-COV-2 Vaccination 04/12/2019, 06/03/2019  . Pneumococcal Conjugate-13 01/22/2015  . Pneumococcal Polysaccharide-23 01/16/2003, 12/03/2012  . Tdap 12/03/2012, 05/22/2016  . Zoster 02/09/2010  . Zoster Recombinat (Shingrix) 01/29/2018, 05/23/2018    Past Medical History:  Diagnosis Date  . Anxiety   . Arthritis    hands right  . Diabetes mellitus  without complication ([place])   . GERD (gastroesophageal reflux disease)   . Hypertension   . Kidney stones    several episodes.  Last one in 2011.    . Obesity    100-lb weight loss since 2011.   . Polycythemia   . Sleep apnea    on Cpap    Tobacco History: Social History   Tobacco Use  Smoking Status Never Smoker  Smokeless Tobacco Never Used   Counseling given: Not Answered   Outpatient Medications Prior to Visit  Medication Sig Dispense Refill  . albuterol (VENTOLIN HFA) 108 (90 Base) MCG/ACT inhaler Inhale 2 puffs into the lungs every 6 (six) hours as needed for wheezing or shortness of breath. 8 g 2  . ALPRAZolam (XANAX) 0.5 MG tablet Take 0.5-1 tablets (0.25-0.5 mg total) by mouth 2 (two) times daily as needed for anxiety. 30 tablet 0  . AMBULATORY NON FORMULARY MEDICATION GI Cocktail - 90 mls of 2% Lidocaine                      90 mls Dicyclomine 10mg /25ml                    270 mls Maalox Take 5-10 ml every 4-6 hours as needed 120 mL 0  . amLODipine (NORVASC) 10 MG tablet Take 1 tablet (10 mg total) by mouth daily. 90 tablet 1  . aspirin EC 81 MG tablet Take 81 mg by mouth daily. Swallow whole.    Marland Kitchen  atorvastatin (LIPITOR) 40 MG tablet TAKE 1 TABLET (40 MG TOTAL) BY MOUTH AT BEDTIME. 90 tablet 1  . Azelastine HCl 137 MCG/SPRAY SOLN Place 2 sprays into the nose 2 (two) times daily. 30 mL 6  . azithromycin (ZITHROMAX Z-PAK) 250 MG tablet 2 tabs a day the first day, then 1 tab a day x 4 days 6 tablet 0  . benzonatate (TESSALON) 100 MG capsule Take 1 capsule (100 mg total) by mouth every 8 (eight) hours. 21 capsule 0  . Caraway Oil-Levomenthol (FDGARD) 25-20.75 MG CAPS Take 2 tablets by mouth in the morning and at bedtime.     . diclofenac sodium (VOLTAREN) 1 % GEL Apply 4 g topically 3 (three) times daily as needed. 100 g 3  . dicyclomine (BENTYL) 20 MG tablet Take 1 tablet (20 mg total) by mouth 3 (three) times daily before meals. 270 tablet 3  . escitalopram (LEXAPRO) 10 MG  tablet Take 1 tablet (10 mg total) by mouth daily. 90 tablet 1  . famotidine (PEPCID) 20 MG tablet Take 1 tablet (20 mg total) by mouth 2 (two) times daily before a meal. 180 tablet 3  . Flaxseed, Linseed, (FLAXSEED OIL) 1000 MG CAPS Take 2,000 mg by mouth daily.     . hydrochlorothiazide (HYDRODIURIL) 25 MG tablet Take 1 tablet (25 mg total) by mouth daily. 90 tablet 1  . hydrocortisone 2.5 % cream Apply topically 2 (two) times daily. 60 g 1  . losartan (COZAAR) 50 MG tablet Take 1 tablet (50 mg total) by mouth daily. 90 tablet 1  . Multiple Vitamin (MULTIVITAMIN) tablet Take 1 tablet by mouth daily.    Marland Kitchen omeprazole (PRILOSEC OTC) 20 MG tablet Take 20 mg by mouth daily.    . sildenafil (REVATIO) 20 MG tablet TAKE 3 TO 4 TABLETS BY MOUTH AT BEDTIME AS NEEDED. (Patient taking differently: Take 60-80 mg by mouth at bedtime as needed.) 30 tablet 3  . sitaGLIPtin (JANUVIA) 100 MG tablet Take 1 tablet (100 mg total) by mouth daily. 90 tablet 1   No facility-administered medications prior to visit.     Review of Systems:   Constitutional:   No  weight loss, night sweats,  Fevers, chills, fatigue, or  lassitude.  HEENT:   No headaches,  Difficulty swallowing,  Tooth/dental problems, or  Sore throat,                No sneezing, itching, ear ache, + nasal congestion, post nasal drip,   CV:  No chest pain,  Orthopnea, PND, swelling in lower extremities, anasarca, dizziness, palpitations, syncope.   GI  No heartburn, indigestion, abdominal pain, nausea, vomiting, diarrhea, change in bowel habits, loss of appetite, bloody stools.   Resp: No shortness of breath with exertion or at rest.  No excess mucus, no productive cough,  No non-productive cough,  No coughing up of blood.  No change in color of mucus.  No wheezing.  No chest wall deformity  Skin: no rash or lesions.  GU: no dysuria, change in color of urine, no urgency or frequency.  No flank pain, no hematuria   MS:  No joint pain or  swelling.  No decreased range of motion.  No back pain.    Physical Exam  BP 124/60 (BP Location: Left Arm, Patient Position: Sitting, Cuff Size: Normal)   Pulse (!) 57   Temp (!) 97.2 F (36.2 C) (Temporal)   Ht 5\' 5"  (1.651 m)   Wt 223 lb 9.6 oz (  101.4 kg)   SpO2 97%   BMI 37.21 kg/m   GEN: A/Ox3; pleasant , NAD, well nourished    HEENT:  [place]/AT,    NOSE-clear, THROAT-clear, no lesions, no postnasal drip or exudate noted.   NECK:  Supple w/ fair ROM; no JVD; normal carotid impulses w/o bruits; no thyromegaly or nodules palpated; no lymphadenopathy.    RESP  Clear  P & A; w/o, wheezes/ rales/ or rhonchi. no accessory muscle use, no dullness to percussion  CARD:  RRR, no m/r/g, no peripheral edema, pulses intact, no cyanosis or clubbing.  GI:   Soft & nt; nml bowel sounds; no organomegaly or masses detected.   Musco: Warm bil, no deformities or joint swelling noted.   Neuro: alert, no focal deficits noted.    Skin: Warm, no lesions or rashes    Lab Results:  CBC  BMET  BNP No results found for: BNP  Imaging:     No flowsheet data found.  No results found for: NITRICOXIDE      Assessment & Plan:   No problem-specific Assessment & Plan notes found for this encounter.     [name], NP 03/30/2020

## 2020-03-30 NOTE — Assessment & Plan Note (Signed)
Obstructive sleep apnea.  Patient has had decreased CPAP compliance due to sinus infection.  Patient is encouraged on CPAP compliance.  Education given we will adjust CPAP pressure for comfort.  Increase CPAP pressure 10 to 20 cm H2O.  Plan  Patient Instructions  Change CPAP pressure to 10 to 20cmH2O.  Change out to new CPAP mask to see if this help with leaks.  Try to wear CPAP every night for at least 6hrs  Work on healthy weight loss.  CPAP download in 1 month  Follow up with Dr. Elsworth Soho  In 1 year and As needed

## 2020-03-30 NOTE — Patient Instructions (Signed)
Change CPAP pressure to 10 to 20cmH2O.  Change out to new CPAP mask to see if this help with leaks.  Try to wear CPAP every night for at least 6hrs  Work on healthy weight loss.  CPAP download in 1 month  Follow up with Dr. Elsworth Soho  In 1 year and As needed

## 2020-03-30 NOTE — Assessment & Plan Note (Signed)
Encouraged on healthy weight loss 

## 2020-04-22 ENCOUNTER — Encounter: Payer: Medicare Other | Admitting: Internal Medicine

## 2020-04-26 ENCOUNTER — Other Ambulatory Visit: Payer: Self-pay

## 2020-04-27 ENCOUNTER — Encounter: Payer: Self-pay | Admitting: Internal Medicine

## 2020-04-27 ENCOUNTER — Ambulatory Visit: Payer: Medicare Other | Admitting: Internal Medicine

## 2020-04-27 ENCOUNTER — Other Ambulatory Visit: Payer: Self-pay

## 2020-04-27 VITALS — BP 140/80 | HR 54 | Ht 65.0 in | Wt 228.1 lb

## 2020-04-27 DIAGNOSIS — E0590 Thyrotoxicosis, unspecified without thyrotoxic crisis or storm: Secondary | ICD-10-CM | POA: Diagnosis not present

## 2020-04-27 NOTE — Patient Instructions (Signed)
-   Please stop by the lab today  

## 2020-04-27 NOTE — Progress Notes (Signed)
Name: Donald Jones  MRN/ DOB: [id number], February 06, 1946    Age/ Sex: 75 y.o., male    PCP: Donald Branch, MD   Reason for Endocrinology Evaluation: Subclinical hyperthyroidism     Date of Initial Endocrinology Evaluation: 04/27/2020     HPI: Donald Jones is a 75 y.o. male with a past medical history of HTN, and OSA. The patient presented for initial endocrinology clinic visit on 04/27/2020 for consultative assistance with his subclinical hyperthyroidism.   He has been noted with intermittent low TSH since 2015 with a nadir of 0.12 uIU/mL in 2020.    He has noted weight loss last year but recently has gained weight, denies palpitations Denies diarrhea   Has slight tremors exacerbated by fatigue   Denies local neck symptoms   Denies CAD or arrhythmia No diagnosis of osteoporosis , had left foot fracture ~ 10 yrs ago. Does not recall the circumstances of the injury.   No FH of thyroid disease       HISTORY:  Past Medical History:  Past Medical History:  Diagnosis Date   Anxiety    Arthritis    hands right   Diabetes mellitus without complication ([facility])    GERD (gastroesophageal reflux disease)    Hypertension    Kidney stones    several episodes.  Last one in 2011.     Obesity    100-lb weight loss since 2011.    Polycythemia    Sleep apnea    on Cpap   Past Surgical History:  Past Surgical History:  Procedure Laterality Date   CHOLECYSTECTOMY     HAND SURGERY  2010   R hand , d/t a injury, 3 surgeries       Social History:  reports that he has never smoked. He has never used smokeless tobacco. He reports that he does not drink alcohol and does not use drugs.  Family History: family history includes Alcohol abuse in his sister; Donald cancer (age of onset: 66) in his sister; Coronary artery disease in his mother; Diabetes in his mother; Heart disease in his sister; Hypertension in his father; Stroke in his father.   HOME  MEDICATIONS: Allergies as of 04/27/2020      Reactions   Metformin And Related    Intolerant, GI symptoms   Sulfa Antibiotics Nausea And Vomiting      Medication List       Accurate as of April 27, 2020  1:37 PM. If you have any questions, ask your nurse or doctor.        albuterol 108 (90 Base) MCG/ACT inhaler Commonly known as: VENTOLIN HFA Inhale 2 puffs into the lungs every 6 (six) hours as needed for wheezing or shortness of breath.   ALPRAZolam 0.5 MG tablet Commonly known as: Xanax Take 0.5-1 tablets (0.25-0.5 mg total) by mouth 2 (two) times daily as needed for anxiety.   AMBULATORY NON FORMULARY MEDICATION GI Cocktail - 90 mls of 2% Lidocaine                      90 mls Dicyclomine 10mg /60ml                    270 mls Maalox Take 5-10 ml every 4-6 hours as needed   amLODipine 10 MG tablet Commonly known as: NORVASC Take 1 tablet (10 mg total) by mouth daily.   aspirin EC 81 MG tablet Take 81 mg by mouth  daily. Swallow whole.   atorvastatin 40 MG tablet Commonly known as: LIPITOR TAKE 1 TABLET (40 MG TOTAL) BY MOUTH AT BEDTIME.   Azelastine HCl 137 MCG/SPRAY Soln Place 2 sprays into the nose 2 (two) times daily.   azithromycin 250 MG tablet Commonly known as: Zithromax Z-Pak 2 tabs a day the first day, then 1 tab a day x 4 days   benzonatate 100 MG capsule Commonly known as: TESSALON Take 1 capsule (100 mg total) by mouth every 8 (eight) hours.   diclofenac sodium 1 % Gel Commonly known as: VOLTAREN Apply 4 g topically 3 (three) times daily as needed.   dicyclomine 20 MG tablet Commonly known as: BENTYL Take 1 tablet (20 mg total) by mouth 3 (three) times daily before meals.   escitalopram 10 MG tablet Commonly known as: LEXAPRO Take 1 tablet (10 mg total) by mouth daily.   famotidine 20 MG tablet Commonly known as: PEPCID Take 1 tablet (20 mg total) by mouth 2 (two) times daily before a meal.   FDgard 25-20.75 MG Caps Generic drug: Caraway  Oil-Levomenthol Take 2 tablets by mouth in the morning and at bedtime.   Flaxseed Oil 1000 MG Caps Take 2,000 mg by mouth daily.   hydrochlorothiazide 25 MG tablet Commonly known as: HYDRODIURIL Take 1 tablet (25 mg total) by mouth daily.   hydrocortisone 2.5 % cream Apply topically 2 (two) times daily.   losartan 50 MG tablet Commonly known as: COZAAR Take 1 tablet (50 mg total) by mouth daily.   multivitamin tablet Take 1 tablet by mouth daily.   omeprazole 20 MG tablet Commonly known as: PRILOSEC OTC Take 20 mg by mouth daily.   sildenafil 20 MG tablet Commonly known as: REVATIO TAKE 3 TO 4 TABLETS BY MOUTH AT BEDTIME AS NEEDED. What changed: See the new instructions.   sitaGLIPtin 100 MG tablet Commonly known as: Januvia Take 1 tablet (100 mg total) by mouth daily.         REVIEW OF SYSTEMS: A comprehensive ROS was conducted with the patient and is negative except as per HPI     OBJECTIVE:  VS: BP 140/80    Pulse (!) 54    Ht 5\' 5"  (1.651 m)    Wt 228 lb 2 oz (103.5 kg)    SpO2 98%    BMI 37.96 kg/m    Wt Readings from Last 3 Encounters:  04/27/20 228 lb 2 oz (103.5 kg)  03/30/20 223 lb 9.6 oz (101.4 kg)  01/23/20 224 lb 6 oz (101.8 kg)     EXAM: General: Pt appears well and is in NAD  Neck: General: Supple without adenopathy. Thyroid: Thyroid size normal.  No goiter or nodules appreciated. No thyroid bruit.  Lungs: Clear with good BS bilat with no rales, rhonchi, or wheezes  Heart: Auscultation: RRR.  Abdomen: Normoactive bowel sounds, soft, nontender, without masses or organomegaly palpable  Extremities:  BL LE: No pretibial edema normal ROM and strength.  Skin: Hair: Texture and amount normal with gender appropriate distribution Skin Inspection: No rashes Skin Palpation: Skin temperature, texture, and thickness normal to palpation  Neuro: Cranial nerves: II - XII grossly intact  Cerebellar: Normal coordination and movement; no tremor Motor:  Normal strength throughout DTRs: 2+ and symmetric in UE without delay in relaxation phase  Mental Status: Judgment, insight: Intact Memory: Intact for recent and remote events Mood and affect: No depression, anxiety, or agitation     DATA REVIEWED:   Results for  Donald Jones (MRN [id number]) as of 04/28/2020 11:59  Ref. Range 04/27/2020 13:41  TSH Latest Ref Range: 0.35 - 4.50 uIU/mL 0.36  Triiodothyronine (T3) Latest Ref Range: 76 - 181 ng/dL 105  T4,Free(Direct) Latest Ref Range: 0.60 - 1.60 ng/dL 1.01    ASSESSMENT/PLAN/RECOMMENDATIONS:   1. Subclinical Hyperthyroidism :  - Pt is clinically euthyroid  - No local neck symptoms  - Discussed D/D of graves' disease vs autonomous thyroid nodule(s)   - Most patients with subclinical hyperthyroidism have no clinical manifestations of hyperthyroidism, and those symptoms that are present (eg, tachycardia, tremor, dyspnea on exertion, weight loss) are mild and nonspecific." However, subclinical hyperthyroidism is associated with an increased risk of atrial fibrillation and, primarily in postmenopausal women, a decrease in bone mineral density.  - Repeat TFt's are normal. No intervention needed at this time    F/U in 4 months   Signed electronically by: Mack Guise, MD  [facility]  [address] [place], [place] [address] Phone: [phone] FAX: [phone]   CC: Donald Branch, MD [address] [place] [address] Phone: [phone] Fax: [phone]   Return to Endocrinology clinic as below: No future appointments.

## 2020-04-28 LAB — TSH: TSH: 0.36 u[IU]/mL (ref 0.35–4.50)

## 2020-04-28 LAB — T4, FREE: Free T4: 1.01 ng/dL (ref 0.60–1.60)

## 2020-04-30 LAB — TRAB (TSH RECEPTOR BINDING ANTIBODY): TRAB: <1 IU/L (ref <=2.00)

## 2020-04-30 LAB — T3: T3, Total: 105 ng/dL (ref 76–181)

## 2020-06-24 ENCOUNTER — Encounter: Payer: Self-pay | Admitting: Internal Medicine

## 2020-06-29 ENCOUNTER — Encounter: Payer: Self-pay | Admitting: Internal Medicine

## 2020-07-20 ENCOUNTER — Other Ambulatory Visit: Payer: Self-pay | Admitting: Internal Medicine

## 2020-07-24 ENCOUNTER — Other Ambulatory Visit: Payer: Self-pay | Admitting: Internal Medicine

## 2020-07-26 ENCOUNTER — Other Ambulatory Visit: Payer: Self-pay | Admitting: Internal Medicine

## 2020-07-27 ENCOUNTER — Telehealth: Payer: Self-pay | Admitting: Internal Medicine

## 2020-07-27 ENCOUNTER — Other Ambulatory Visit: Payer: Self-pay

## 2020-07-27 MED ORDER — OMEPRAZOLE MAGNESIUM 20 MG PO TBEC
20.0000 mg | DELAYED_RELEASE_TABLET | Freq: Every day | ORAL | 3 refills | Status: DC
Start: 2020-07-27 — End: 2021-06-27

## 2020-07-27 NOTE — Telephone Encounter (Signed)
Per med list Pt should be on omeprazole 20mg - we were receiving Rx for omeprazole 40mg - rx sent for 20mg .

## 2020-07-27 NOTE — Telephone Encounter (Signed)
Wants to know why omeprazole was denied

## 2020-08-06 ENCOUNTER — Encounter: Payer: Self-pay | Admitting: Internal Medicine

## 2020-08-06 ENCOUNTER — Ambulatory Visit (INDEPENDENT_AMBULATORY_CARE_PROVIDER_SITE_OTHER): Payer: Medicare Other | Admitting: Internal Medicine

## 2020-08-06 ENCOUNTER — Other Ambulatory Visit: Payer: Self-pay

## 2020-08-06 VITALS — BP 142/70 | HR 57 | Temp 98.4°F | Resp 16 | Ht 65.0 in | Wt 236.0 lb

## 2020-08-06 DIAGNOSIS — Z79899 Other long term (current) drug therapy: Secondary | ICD-10-CM

## 2020-08-06 DIAGNOSIS — E119 Type 2 diabetes mellitus without complications: Secondary | ICD-10-CM

## 2020-08-06 DIAGNOSIS — E7800 Pure hypercholesterolemia, unspecified: Secondary | ICD-10-CM | POA: Diagnosis not present

## 2020-08-06 DIAGNOSIS — Z0000 Encounter for general adult medical examination without abnormal findings: Secondary | ICD-10-CM | POA: Diagnosis not present

## 2020-08-06 DIAGNOSIS — F419 Anxiety disorder, unspecified: Secondary | ICD-10-CM

## 2020-08-06 MED ORDER — ALBUTEROL SULFATE HFA 108 (90 BASE) MCG/ACT IN AERS: 2.0000 | INHALATION_SPRAY | Freq: Four times a day (QID) | RESPIRATORY_TRACT | 5 refills | Status: DC | PRN

## 2020-08-06 NOTE — Patient Instructions (Addendum)
Per our records you are due for your diabetic eye exam. Please contact your eye doctor to schedule an appointment. Please have them send copies of your office visit notes to [place]. Our fax number is [phone]. If you need a referral to an eye doctor please let us know.  Check the  blood pressure weekly BP GOAL is between 110/65 and  135/85. If it is consistently higher or lower, let me know    GO TO THE LAB : Get the blood work     [place], [place] back for   a checkup in 4 months     "Living will", "[place] of attorney": Advanced care planning  (If you already have a living will or healthcare power of attorney, please bring the copy to be scanned in your chart.)  Advance care planning is a process that supports adults in  understanding and sharing their preferences regarding future medical care.   The patient's preferences are recorded in documents called Advance Directives.    Advanced directives are completed (and can be modified at any time) while the patient is in full mental capacity.   The documentation should be available at all times to the patient, the family and the healthcare providers.  Bring in a copy to be scanned in your chart is an excellent idea and is recommended   This legal documents direct treatment decision making and/or appoint a surrogate to make the decision if the patient is not capable to do so.    Advance directives can be documented in many types of formats,  documents have names such as:  Lliving will  Durable power of attorney for healthcare (healthcare proxy or healthcare power of attorney)  Combined directives  Physician orders for life-sustaining treatment    More information at:  [email or web address]

## 2020-08-06 NOTE — Progress Notes (Signed)
Subjective:    Patient ID: Donald Jones, male    DOB: Feb 04, 1946, 75 y.o.   MRN: [id number]  DOS:  08/06/2020 Type of visit - description: Here for CPX  In addition to CPX, we went over his chronic medical problems.  Few days ago he was picking up a water tank and in the process he developed pain on the inner aspect of the right leg.  No hematoma, pain is somewhat better.  Developed a rash on the upper left back.  Not painful, slightly itchy.  Getting better.   Wt Readings from Last 3 Encounters:  08/06/20 236 lb (107 kg)  04/27/20 228 lb 2 oz (103.5 kg)  03/30/20 223 lb 9.6 oz (101.4 kg)   BP Readings from Last 3 Encounters:  08/06/20 (!) 142/70  04/27/20 140/80  03/30/20 124/60      Review of Systems He does have from time to time IBS symptoms but nothing unusual.  No blood in the stools.  Other than above, a 14 point review of systems is negative      Past Medical History:  Diagnosis Date  . Anxiety   . Arthritis    hands right  . Diabetes mellitus without complication ([place])   . GERD (gastroesophageal reflux disease)   . Hypertension   . Kidney stones    several episodes.  Last one in 2011.    . Obesity    100-lb weight loss since 2011.   . Polycythemia   . Sleep apnea    on Cpap    Past Surgical History:  Procedure Laterality Date  . CHOLECYSTECTOMY    . HAND SURGERY  2010   R hand , d/t a injury, 3 surgeries    Social History   Socioeconomic History  . Marital status: Married    Spouse name: Not on file  . Number of children: 3   . Years of education: Not on file  . Highest education level: Not on file  Occupational History  . 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 Smoking status: Never Smoker  . Smokeless tobacco: Never Used  Vaping Use  . Vaping Use: Never used  Substance and Sexual Activity  . Alcohol use: No    Comment: none for 15 years  . Drug use: No  . Sexual activity: Not on file  Other Topics Concern  . Not  on file  Social History Narrative   1ST WIFE DIED FROM LUNG CANCER, 2ND WIFE DIED FROM RENAL CANCER.     Married x 4 , lives w/ wife   3 sisters and 3 brother : lost 2 sisters    Social Determinants of Radio broadcast assistant Strain: Not on file  Food Insecurity: Not on file  Transportation Needs: Not on file  Physical Activity: Not on file  Stress: Not on file  Social Connections: Not on file  Intimate Partner Violence: Not on file    Allergies as of 08/06/2020      Reactions   Metformin And Related    Intolerant, GI symptoms   Sulfa Antibiotics Nausea And Vomiting      Medication List       Accurate as of August 06, 2020 11:59 PM. If you have any questions, ask your nurse or doctor.        STOP taking these medications   azithromycin 250 MG tablet Commonly known as: Zithromax Z-Pak Stopped by: Kathlene November, MD   benzonatate 100 MG capsule Commonly known  as: TESSALON Stopped by: Kathlene November, MD     TAKE these medications   albuterol 108 (90 Base) MCG/ACT inhaler Commonly known as: VENTOLIN HFA Inhale 2 puffs into the lungs every 6 (six) hours as needed for wheezing or shortness of breath.   ALPRAZolam 0.5 MG tablet Commonly known as: Xanax Take 0.5-1 tablets (0.25-0.5 mg total) by mouth 2 (two) times daily as needed for anxiety.   AMBULATORY NON FORMULARY MEDICATION GI Cocktail - 90 mls of 2% Lidocaine                      90 mls Dicyclomine 10mg /24ml                    270 mls Maalox Take 5-10 ml every 4-6 hours as needed   amLODipine 10 MG tablet Commonly known as: NORVASC Take 1 tablet (10 mg total) by mouth daily.   aspirin EC 81 MG tablet Take 81 mg by mouth daily. Swallow whole.   atorvastatin 40 MG tablet Commonly known as: LIPITOR TAKE 1 TABLET (40 MG TOTAL) BY MOUTH AT BEDTIME.   Azelastine HCl 137 MCG/SPRAY Soln Place 2 sprays into the nose 2 (two) times daily.   diclofenac sodium 1 % Gel Commonly known as: VOLTAREN Apply 4 g topically 3  (three) times daily as needed.   dicyclomine 20 MG tablet Commonly known as: BENTYL Take 1 tablet (20 mg total) by mouth 3 (three) times daily before meals.   escitalopram 10 MG tablet Commonly known as: LEXAPRO Take 1 tablet (10 mg total) by mouth daily.   famotidine 20 MG tablet Commonly known as: PEPCID Take 1 tablet (20 mg total) by mouth 2 (two) times daily before a meal.   FDgard 25-20.75 MG Caps Generic drug: Caraway Oil-Levomenthol Take 2 tablets by mouth in the morning and at bedtime.   Flaxseed Oil 1000 MG Caps Take 2,000 mg by mouth daily.   hydrochlorothiazide 25 MG tablet Commonly known as: HYDRODIURIL Take 1 tablet (25 mg total) by mouth daily.   hydrocortisone 2.5 % cream Apply topically 2 (two) times daily.   losartan 50 MG tablet Commonly known as: COZAAR Take 1 tablet (50 mg total) by mouth daily.   multivitamin tablet Take 1 tablet by mouth daily.   omeprazole 20 MG tablet Commonly known as: PRILOSEC OTC Take 1 tablet (20 mg total) by mouth daily.   sildenafil 20 MG tablet Commonly known as: REVATIO TAKE 3 TO 4 TABLETS BY MOUTH AT BEDTIME AS NEEDED. What changed: See the new instructions.   sitaGLIPtin 100 MG tablet Commonly known as: Januvia Take 1 tablet (100 mg total) by mouth daily.          Objective:   Physical Exam BP (!) 142/70 (BP Location: Left Arm, Patient Position: Sitting, Cuff Size: Normal)   Pulse (!) 57   Temp 98.4 F (36.9 C) (Oral)   Resp 16   Ht 5\' 5"  (1.651 m)   Wt 236 lb (107 kg)   SpO2 97%   BMI 39.27 kg/m  General: Well developed, NAD, BMI noted Neck: No  thyromegaly  HEENT:  Normocephalic . Face symmetric, atraumatic Lungs:  CTA B Normal respiratory effort, no intercostal retractions, no accessory muscle use. Heart: RRR,  no murmur.  Abdomen:  Not distended, soft, non-tender. No rebound or rigidity.   Lower extremities: no pretibial edema bilaterally DRE: Normal sphincter tone, brown stools.   Prostate slightly enlarged but not tender or  nodular Skin: At the upper left back he has 3 of 4 nonspecific slightly erythematous places around 3 to 4 mm in size.  No blisters.  No lesions on the left lateral chest Neurologic:  alert & oriented X3.  Speech normal, gait at baseline, unassisted Strength symmetric and appropriate for age.  Psych: Cognition and judgment appear intact.  Cooperative with normal attention span and concentration.  Behavior appropriate. No anxious or depressed appearing.     Assessment      ASSESSMENT DM (pre-DM until 2016).  Intolerant to Metformin, see office visit 09/09/2019 HTN Polycythemia- sees hematology, likely d/t OSA-diuretics, JAK2 (-) ~ 2014, last OV 10-2014, f/u prn OSA on CPAP DJD- saw Dr Rhona Raider before  R Foot drop  GI: -GERD - IBS? GI sx on and off, previously dx w/ IBS -Cscopes: 8768,1157, 09/18/2017 - EGD 04/2018, showed gastritis, no H. pylori, no malignancy Urolithiasis, several episodes Negative Myoview 2019, CT coronary angiography 08/2019: Minimal disease  PLAN: Here for CPX DM: On Januvia, checking A1c Subclinical hyperthyroidism: Saw Endo 04/27/2020, TFTs normal, they recommended a follow-up in few months. HTN: Currently on amlodipine, HCTZ, losartan.  Ambulatory BPs range from the 140s/70 to the 130s.  No change for now, encourage low-salt diet and continue monitoring, reassess on RCT. GERD, IBS: Sxs at baseline, takes medicines as needed Rash, upper back: No evidence of shingles, recommend observation. Anxiety: Rarely gets Xanax, check a UDS. Albuterol: Was prescribed to him before, request a refill, uses very sporadically for allergies RTC 4 months  This visit occurred during the SARS-CoV-2 public health emergency.  Safety protocols were in place, including screening questions prior to the visit, additional usage of staff PPE, and extensive cleaning of exam room while observing appropriate contact time as indicated for  disinfecting solutions.

## 2020-08-07 ENCOUNTER — Encounter: Payer: Self-pay | Admitting: Internal Medicine

## 2020-08-07 LAB — COMPREHENSIVE METABOLIC PANEL
AG Ratio: 1.4 (calc) (ref 1.0–2.5)
ALT: 30 U/L (ref 9–46)
AST: 24 U/L (ref 10–35)
Albumin: 4 g/dL (ref 3.6–5.1)
Alkaline phosphatase (APISO): 81 U/L (ref 35–144)
BUN/Creatinine Ratio: 16 (calc) (ref 6–22)
BUN: 20 mg/dL (ref 7–25)
CO2: 28 mmol/L (ref 20–32)
Calcium: 10 mg/dL (ref 8.6–10.3)
Chloride: 100 mmol/L (ref 98–110)
Creat: 1.24 mg/dL — ABNORMAL HIGH (ref 0.70–1.18)
Globulin: 2.9 g/dL (calc) (ref 1.9–3.7)
Glucose, Bld: 115 mg/dL — ABNORMAL HIGH (ref 65–99)
Potassium: 4.5 mmol/L (ref 3.5–5.3)
Sodium: 137 mmol/L (ref 135–146)
Total Bilirubin: 1.7 mg/dL — ABNORMAL HIGH (ref 0.2–1.2)
Total Protein: 6.9 g/dL (ref 6.1–8.1)

## 2020-08-07 LAB — CBC WITH DIFFERENTIAL/PLATELET
Absolute Monocytes: 680 cells/uL (ref 200–950)
Basophils Absolute: 57 cells/uL (ref 0–200)
Basophils Relative: 0.7 %
Eosinophils Absolute: 178 cells/uL (ref 15–500)
Eosinophils Relative: 2.2 %
HCT: 48 % (ref 38.5–50.0)
Hemoglobin: 15.9 g/dL (ref 13.2–17.1)
Lymphs Abs: 2219 cells/uL (ref 850–3900)
MCH: 30.6 pg (ref 27.0–33.0)
MCHC: 33.1 g/dL (ref 32.0–36.0)
MCV: 92.3 fL (ref 80.0–100.0)
MPV: 10.4 fL (ref 7.5–12.5)
Monocytes Relative: 8.4 %
Neutro Abs: 4965 cells/uL (ref 1500–7800)
Neutrophils Relative %: 61.3 %
Platelets: 211 10*3/uL (ref 140–400)
RBC: 5.2 10*6/uL (ref 4.20–5.80)
RDW: 12.3 % (ref 11.0–15.0)
Total Lymphocyte: 27.4 %
WBC: 8.1 10*3/uL (ref 3.8–10.8)

## 2020-08-07 LAB — LIPID PANEL
Cholesterol: 107 mg/dL (ref <200)
HDL: 44 mg/dL (ref >=40)
LDL Cholesterol (Calc): 49 mg/dL (calc)
Non-HDL Cholesterol (Calc): 63 mg/dL (calc) (ref <130)
Total CHOL/HDL Ratio: 2.4 (calc) (ref <5.0)
Triglycerides: 55 mg/dL (ref <150)

## 2020-08-07 LAB — HEMOGLOBIN A1C
Hgb A1c MFr Bld: 6.7 % of total Hgb — ABNORMAL HIGH (ref <5.7)
Mean Plasma Glucose: 146 mg/dL
eAG (mmol/L): 8.1 mmol/L

## 2020-08-07 LAB — DRUG MONITORING, PANEL 8 WITH CONFIRMATION, URINE
6 Acetylmorphine: NEGATIVE ng/mL (ref <10)
Alcohol Metabolites: NEGATIVE ng/mL
Amphetamines: NEGATIVE ng/mL (ref <500)
Benzodiazepines: NEGATIVE ng/mL (ref <100)
Buprenorphine, Urine: NEGATIVE ng/mL (ref <5)
Cocaine Metabolite: NEGATIVE ng/mL (ref <150)
Creatinine: 62.8 mg/dL
MDMA: NEGATIVE ng/mL (ref <500)
Marijuana Metabolite: NEGATIVE ng/mL (ref <20)
Opiates: NEGATIVE ng/mL (ref <100)
Oxidant: NEGATIVE ug/mL
Oxycodone: NEGATIVE ng/mL (ref <100)
pH: 6.2 (ref 4.5–9.0)

## 2020-08-07 LAB — DM TEMPLATE

## 2020-08-07 LAB — PSA: PSA: 1.37 ng/mL (ref <=4.00)

## 2020-08-07 NOTE — Assessment & Plan Note (Signed)
 -  Td 2018, pneumonia shot 2014; prevnar: 01-2015; Zoster  2011; s/p shingrix x 2 -COVID VAX x3, booster discussed -CCS: cscope ~ 2008,Cscope again 05-2012, Cscope 09/2017: no polyp, 5 years  - prostrate ca screening: DRE normal, essentially no symptoms, check a PSA. -labs: CMP, FLP, CBC, A1c, PSA, UDS - diet exercise discussed, has not been able to be active because his wife had an accident and he has been taking care of her.  She is getting better, encourage patient to get more physical activity and eat healthy. -Advance directive discussed.

## 2020-08-07 NOTE — Assessment & Plan Note (Signed)
Here for CPX DM: On Januvia, checking A1c Subclinical hyperthyroidism: Saw Endo 04/27/2020, TFTs normal, they recommended a follow-up in few months. HTN: Currently on amlodipine, HCTZ, losartan.  Ambulatory BPs range from the 140s/70 to the 130s.  No change for now, encourage low-salt diet and continue monitoring, reassess on RCT. GERD, IBS: Sxs at baseline, takes medicines as needed Rash, upper back: No evidence of shingles, recommend observation. Anxiety: Rarely gets Xanax, check a UDS. Albuterol: Was prescribed to him before, request a refill, uses very sporadically for allergies RTC 4 months

## 2020-08-17 DIAGNOSIS — L608 Other nail disorders: Secondary | ICD-10-CM | POA: Diagnosis not present

## 2020-08-17 DIAGNOSIS — M2041 Other hammer toe(s) (acquired), right foot: Secondary | ICD-10-CM | POA: Diagnosis not present

## 2020-08-17 DIAGNOSIS — B351 Tinea unguium: Secondary | ICD-10-CM | POA: Diagnosis not present

## 2020-08-17 DIAGNOSIS — L84 Corns and callosities: Secondary | ICD-10-CM | POA: Diagnosis not present

## 2020-08-17 DIAGNOSIS — M2011 Hallux valgus (acquired), right foot: Secondary | ICD-10-CM | POA: Diagnosis not present

## 2020-08-17 DIAGNOSIS — E119 Type 2 diabetes mellitus without complications: Secondary | ICD-10-CM | POA: Diagnosis not present

## 2020-08-17 DIAGNOSIS — L814 Other melanin hyperpigmentation: Secondary | ICD-10-CM | POA: Diagnosis not present

## 2020-08-23 NOTE — Progress Notes (Signed)
Cardiology Office Note   Date:  08/24/2020   ID:  Donald Jones, DOB February 22, 1946, MRN [id number]  PCP:  Colon Branch, MD    No chief complaint on file.  Chest pain  Wt Readings from Last 3 Encounters:  08/24/20 234 lb 12.8 oz (106.5 kg)  08/06/20 236 lb (107 kg)  04/27/20 228 lb 2 oz (103.5 kg)       History of Present Illness: Donald Jones is a 75 y.o. male  Who I saw for chest pain in 12/19.  He has had chronic GI issuesand endoscopy was planned.   Stress test in 12/19 showed:  "The left ventricular ejection fraction is normal (55-65%).  Nuclear stress EF: 60%.  Blood pressure demonstrated a normal response to exercise.  The study is normal.  This is a low risk study.  Normal resting and stress perfusion. No ischemia or infarction EF 60% Baseline ECG with ST changes and artifact during stress so nondiagnostic Patient achieved only 87% PMHR Normal hemodynamic response"  Hospitalized in 4/21 with atypical chest pain: "He had a burning substernal discomfort. It mostly occurs after meals or with anxiety. He has taken a GI cocktail as an outpatient in the past. No history of exertional chest pain.  His cardiac enzymes remained negative and his ECG is nonacute.  A cardiac CT was felt the most appropriate test for him, but it could not be obtained because of poor IV access and infiltrated IVs.  Dr.Acharyareviewed the situation with the family and the decision was made to treat GI symptoms and have a early follow-up in the office with cardiology and with GI.  Therefore, no further inpatient work-up is indicated and he is considered stable for discharge, to follow up as an outpatient."   2021 echo showed normal LV function and valvular function.  Noted in 2021: "He continues to have occasional tightness in his chest.  It feels like gas pain to him.  No pain in the last three days.  He is a Contractor.  Feels well at work.  GI cocktail  seems to help.  Xanax helps as well. "  2021 coronary CTA showed: "Coronary calcium score of 59. This was 33rd percentile for age and sex matched control.  2. Calcified plaque in the ostial left main causes minimal (0-24%) stenosis  3. Mid LAD myocardial bridge with length 56mm  CAD-RADS 1. Minimal non-obstructive CAD (0-24%). Consider non-atherosclerotic causes of chest pain. Consider preventive therapy and risk factor modification."  No recent chest pain since the last visit.   Denies : Chest pain. Dizziness. Leg edema. Nitroglycerin use. Orthopnea. Palpitations. Paroxysmal nocturnal dyspnea. Shortness of breath. Syncope.   Past Medical History:  Diagnosis Date  . Anxiety   . Arthritis    hands right  . Diabetes mellitus without complication ([place])   . GERD (gastroesophageal reflux disease)   . Hypertension   . Kidney stones    several episodes.  Last one in 2011.    . Obesity    100-lb weight loss since 2011.   . Polycythemia   . Sleep apnea    on Cpap    Past Surgical History:  Procedure Laterality Date  . CHOLECYSTECTOMY    . HAND SURGERY  2010   R hand , d/t a injury, 3 surgeries      Current Outpatient Medications  Medication Sig Dispense Refill  . albuterol (VENTOLIN HFA) 108 (90 Base) MCG/ACT inhaler Inhale 2 puffs into the  lungs every 6 (six) hours as needed for wheezing or shortness of breath. 18 g 5  . ALPRAZolam (XANAX) 0.5 MG tablet Take 0.5-1 tablets (0.25-0.5 mg total) by mouth 2 (two) times daily as needed for anxiety. 30 tablet 0  . AMBULATORY NON FORMULARY MEDICATION GI Cocktail - 90 mls of 2% Lidocaine                      90 mls Dicyclomine 10mg /95ml                    270 mls Maalox Take 5-10 ml every 4-6 hours as needed (Patient taking differently: GI Cocktail - 90 mls of 2% Lidocaine                      90 mls Dicyclomine 10mg /45ml                    270 mls Maalox Take 5-10 ml every 4-6 hours as needed) 120 mL 0  . amLODipine (NORVASC)  10 MG tablet Take 1 tablet (10 mg total) by mouth daily. 90 tablet 1  . aspirin EC 81 MG tablet Take 81 mg by mouth daily. Swallow whole.    Marland Kitchen atorvastatin (LIPITOR) 40 MG tablet TAKE 1 TABLET (40 MG TOTAL) BY MOUTH AT BEDTIME. 90 tablet 1  . Azelastine HCl 137 MCG/SPRAY SOLN Place 2 sprays into the nose 2 (two) times daily. 30 mL 6  . Caraway Oil-Levomenthol (FDGARD) 25-20.75 MG CAPS Take 2 tablets by mouth in the morning and at bedtime.     . diclofenac sodium (VOLTAREN) 1 % GEL Apply 4 g topically 3 (three) times daily as needed. 100 g 3  . dicyclomine (BENTYL) 20 MG tablet Take 1 tablet (20 mg total) by mouth 3 (three) times daily before meals. 270 tablet 1  . escitalopram (LEXAPRO) 10 MG tablet Take 1 tablet (10 mg total) by mouth daily. 90 tablet 1  . famotidine (PEPCID) 20 MG tablet Take 1 tablet (20 mg total) by mouth 2 (two) times daily before a meal. 180 tablet 3  . Flaxseed, Linseed, (FLAXSEED OIL) 1000 MG CAPS Take 2,000 mg by mouth daily.     . hydrochlorothiazide (HYDRODIURIL) 25 MG tablet Take 1 tablet (25 mg total) by mouth daily. 90 tablet 1  . hydrocortisone 2.5 % cream Apply topically 2 (two) times daily. 60 g 1  . losartan (COZAAR) 50 MG tablet Take 1 tablet (50 mg total) by mouth daily. 90 tablet 1  . Multiple Vitamin (MULTIVITAMIN) tablet Take 1 tablet by mouth daily.    Marland Kitchen omeprazole (PRILOSEC OTC) 20 MG tablet Take 1 tablet (20 mg total) by mouth daily. 90 tablet 3  . sildenafil (REVATIO) 20 MG tablet TAKE 3 TO 4 TABLETS BY MOUTH AT BEDTIME AS NEEDED. (Patient taking differently: Take 60-80 mg by mouth at bedtime as needed.) 30 tablet 3  . sitaGLIPtin (JANUVIA) 100 MG tablet Take 1 tablet (100 mg total) by mouth daily. 90 tablet 1   No current facility-administered medications for this visit.    Allergies:   Metformin and related and Sulfa antibiotics    Social History:  The patient  reports that he has never smoked. He has never used smokeless tobacco. He reports that  he does not drink alcohol and does not use drugs.   Family History:  The patient's family history includes Alcohol abuse in his sister; Colon cancer (age of  onset: 77) in his sister; Coronary artery disease (age of onset: 82) in his mother; Diabetes in his mother; Heart disease in his sister; Hypertension in his father; Stroke in his father.    ROS:  Please see the history of present illness.   Otherwise, review of systems are positive for mild orthostatic sx.   All other systems are reviewed and negative.    PHYSICAL EXAM: VS:  BP 124/72   Pulse (!) 58   Ht 5\' 5"  (1.651 m)   Wt 234 lb 12.8 oz (106.5 kg)   SpO2 95%   BMI 39.07 kg/m  , BMI Body mass index is 39.07 kg/m. GEN: Well nourished, well developed, in no acute distress  HEENT: normal  Neck: no JVD, carotid bruits, or masses Cardiac: RRR; no murmurs, rubs, or gallops,; 1+ bilateral LE edema  Respiratory:  clear to auscultation bilaterally, normal work of breathing GI: soft, nontender, nondistended, + BS, obese MS: no deformity or atrophy  Skin: warm and dry, no rash Neuro:  Strength and sensation are intact Psych: euthymic mood, full affect   EKG:   The ekg ordered today demonstrates NSR, no ST changes   Recent Labs: 04/27/2020: TSH 0.36 08/06/2020: ALT 30; BUN 20; Creat 1.24; Hemoglobin 15.9; Platelets 211; Potassium 4.5; Sodium 137   Lipid Panel    Component Value Date/Time   CHOL 107 08/06/2020 1406   TRIG 55 08/06/2020 1406   HDL 44 08/06/2020 1406   CHOLHDL 2.4 08/06/2020 1406   VLDL 6 07/25/2019 0320   LDLCALC 49 08/06/2020 1406     Other studies Reviewed: Additional studies/ records that were reviewed today with results demonstrating: labs reviewed.   ASSESSMENT AND PLAN:  1.   Precordial chest pain:resolved. Low coronary calcium score. 2.   Type 2 DM: A1C 6.7. increase walking.   3.   Obesity: Gained some weight since last visit.  High fiber, whole food, plant based diet.  Eating snacks at night.    4.   Hyperlipidemia: LDL 49.  COntinue statin. Heathy diet.  5.   HTN: The current medical regimen is effective;  continue present plan and medications.    Current medicines are reviewed at length with the patient today.  The patient concerns regarding his medicines were addressed.  The following changes have been made:  No change  Labs/ tests ordered today include:  No orders of the defined types were placed in this encounter.   Recommend 150 minutes/week of aerobic exercise Low fat, low carb, high fiber diet recommended  Disposition:   FU in 1 year   Signed, Larae Grooms, MD  08/24/2020 3:20 PM    [facility] [address], [place], [place]  [address] Phone: [phone]; Fax: [phone]

## 2020-08-24 ENCOUNTER — Other Ambulatory Visit: Payer: Self-pay

## 2020-08-24 ENCOUNTER — Ambulatory Visit: Payer: Medicare Other | Admitting: Interventional Cardiology

## 2020-08-24 ENCOUNTER — Encounter: Payer: Self-pay | Admitting: Interventional Cardiology

## 2020-08-24 VITALS — BP 124/72 | HR 58 | Ht 65.0 in | Wt 234.8 lb

## 2020-08-24 DIAGNOSIS — R072 Precordial pain: Secondary | ICD-10-CM | POA: Diagnosis not present

## 2020-08-24 DIAGNOSIS — E119 Type 2 diabetes mellitus without complications: Secondary | ICD-10-CM

## 2020-08-24 DIAGNOSIS — E782 Mixed hyperlipidemia: Secondary | ICD-10-CM

## 2020-08-24 NOTE — Patient Instructions (Signed)
Medication Instructions:  Your physician recommends that you continue on your current medications as directed. Please refer to the Current Medication list given to you today.  *If you need a refill on your cardiac medications before your next appointment, please call your pharmacy*   Lab Work: none If you have labs (blood work) drawn today and your tests are completely normal, you will receive your results only by: Marland Kitchen MyChart Message (if you have MyChart) OR . A paper copy in the mail If you have any lab test that is abnormal or we need to change your treatment, we will call you to review the results.   Testing/Procedures: none   Follow-Up: At [facility], you and your health needs are our priority.  As part of our continuing mission to provide you with exceptional heart care, we have created designated Provider Care Teams.  These Care Teams include your primary Cardiologist (physician) and Advanced Practice Providers (APPs -  Physician Assistants and Nurse Practitioners) who all work together to provide you with the care you need, when you need it.  We recommend signing up for the patient portal called "MyChart".  Sign up information is provided on this After Visit Summary.  MyChart is used to connect with patients for Virtual Visits (Telemedicine).  Patients are able to view lab/test results, encounter notes, upcoming appointments, etc.  Non-urgent messages can be sent to your provider as well.   To learn more about what you can do with MyChart, go to [email or web address].    Your next appointment:   12 month(s)  The format for your next appointment:   In Person  Provider:   You may see Larae Grooms, MD or one of the following Advanced Practice Providers on your designated Care Team:    Melina Copa, PA-C  Ermalinda Barrios, PA-C    Other Instructions  Elevate legs when possible   High-Fiber Eating Plan Fiber, also called dietary fiber, is a type of carbohydrate. It  is found foods such as fruits, vegetables, whole grains, and beans. A high-fiber diet can have many health benefits. Your health care provider may recommend a high-fiber diet to help:  Prevent constipation. Fiber can make your bowel movements more regular.  Lower your cholesterol.  Relieve the following conditions: ? Inflammation of veins in the anus (hemorrhoids). ? Inflammation of specific areas of the digestive tract (uncomplicated diverticulosis). ? A problem of the large intestine, also called the colon, that sometimes causes pain and diarrhea (irritable bowel syndrome, or IBS).  Prevent overeating as part of a weight-loss plan.  Prevent heart disease, type 2 diabetes, and certain cancers. What are tips for following this plan? Reading food labels  Check the nutrition facts label on food products for the amount of dietary fiber. Choose foods that have 5 grams of fiber or more per serving.  The goals for recommended daily fiber intake include: ? Men (age 16 or younger): 34-38 g. ? Men (over age 45): 28-34 g. ? Women (age 66 or younger): 25-28 g. ? Women (over age 56): 22-25 g. Your daily fiber goal is _____________ g.   Shopping  Choose whole fruits and vegetables instead of processed forms, such as apple juice or applesauce.  Choose a wide variety of high-fiber foods such as avocados, lentils, oats, and kidney beans.  Read the nutrition facts label of the foods you choose. Be aware of foods with added fiber. These foods often have high sugar and sodium amounts per serving. Cooking  Use whole-grain  flour for baking and cooking.  Cook with brown rice instead of white rice. Meal planning  Start the day with a breakfast that is high in fiber, such as a cereal that contains 5 g of fiber or more per serving.  Eat breads and cereals that are made with whole-grain flour instead of refined flour or white flour.  Eat brown rice, bulgur wheat, or millet instead of white  rice.  Use beans in place of meat in soups, salads, and pasta dishes.  Be sure that half of the grains you eat each day are whole grains. General information  You can get the recommended daily intake of dietary fiber by: ? Eating a variety of fruits, vegetables, grains, nuts, and beans. ? Taking a fiber supplement if you are not able to take in enough fiber in your diet. It is better to get fiber through food than from a supplement.  Gradually increase how much fiber you consume. If you increase your intake of dietary fiber too quickly, you may have bloating, cramping, or gas.  Drink plenty of water to help you digest fiber.  Choose high-fiber snacks, such as berries, raw vegetables, nuts, and popcorn. What foods should I eat? Fruits Berries. Pears. Apples. Oranges. Avocado. Prunes and raisins. Dried figs. Vegetables Sweet potatoes. Spinach. Kale. Artichokes. Cabbage. Broccoli. Cauliflower. Green peas. Carrots. Squash. Grains Whole-grain breads. Multigrain cereal. Oats and oatmeal. Brown rice. Barley. Bulgur wheat. [place]. Quinoa. Bran muffins. Popcorn. Rye wafer crackers. Meats and other proteins Navy beans, kidney beans, and pinto beans. Soybeans. Split peas. Lentils. Nuts and seeds. Dairy Fiber-fortified yogurt. Beverages Fiber-fortified soy milk. Fiber-fortified orange juice. Other foods Fiber bars. The items listed above may not be a complete list of recommended foods and beverages. Contact a dietitian for more information. What foods should I avoid? Fruits Fruit juice. Cooked, strained fruit. Vegetables Fried potatoes. Canned vegetables. Well-cooked vegetables. Grains White bread. Pasta made with refined flour. White rice. Meats and other proteins Fatty cuts of meat. Fried chicken or fried fish. Dairy Milk. Yogurt. Cream cheese. Sour cream. Fats and oils Butters. Beverages Soft drinks. Other foods Cakes and pastries. The items listed above may not be a complete  list of foods and beverages to avoid. Talk with your dietitian about what choices are best for you. Summary  Fiber is a type of carbohydrate. It is found in foods such as fruits, vegetables, whole grains, and beans.  A high-fiber diet has many benefits. It can help to prevent constipation, lower blood cholesterol, aid weight loss, and reduce your risk of heart disease, diabetes, and certain cancers.  Increase your intake of fiber gradually. Increasing fiber too quickly may cause cramping, bloating, and gas. Drink plenty of water while you increase the amount of fiber you consume.  The best sources of fiber include whole fruits and vegetables, whole grains, nuts, seeds, and beans. This information is not intended to replace advice given to you by your health care provider. Make sure you discuss any questions you have with your health care provider. Document Revised: 08/07/2019 Document Reviewed: 08/07/2019 Elsevier Patient Education  2021 [employer].

## 2020-08-27 NOTE — Addendum Note (Signed)
Addended by: Gar Ponto on: 08/27/2020 07:28 AM   Modules accepted: Orders

## 2020-08-30 ENCOUNTER — Other Ambulatory Visit: Payer: Self-pay | Admitting: Internal Medicine

## 2020-09-08 ENCOUNTER — Other Ambulatory Visit: Payer: Self-pay | Admitting: Internal Medicine

## 2020-09-24 ENCOUNTER — Ambulatory Visit: Payer: Medicare Other | Attending: Internal Medicine

## 2020-09-24 DIAGNOSIS — Z23 Encounter for immunization: Secondary | ICD-10-CM

## 2020-09-24 NOTE — Progress Notes (Signed)
   Covid-19 Vaccination Clinic  Name:  Donald Jones    MRN: [id number] DOB: 1945/06/23  09/24/2020  [name] was observed post Covid-19 immunization for 15 minutes without incident. He was provided with Vaccine Information Sheet and instruction to access the V-Safe system.   [name] was instructed to call 911 with any severe reactions post vaccine: Difficulty breathing  Swelling of face and throat  A fast heartbeat  A bad rash all over body  Dizziness and weakness   Immunizations Administered     Name Date Dose VIS Date Route   PFIZER Comrnaty(Gray TOP) Covid-19 Vaccine 09/24/2020  2:26 PM 0.3 mL 03/25/2020 Intramuscular   Manufacturer: [place]   Lot: [id number]   [place]: [phone]

## 2020-09-27 ENCOUNTER — Other Ambulatory Visit: Payer: Self-pay | Admitting: Internal Medicine

## 2020-09-28 ENCOUNTER — Other Ambulatory Visit: Payer: Self-pay | Admitting: Internal Medicine

## 2020-09-28 ENCOUNTER — Other Ambulatory Visit (HOSPITAL_BASED_OUTPATIENT_CLINIC_OR_DEPARTMENT_OTHER): Payer: Self-pay

## 2020-09-28 MED ORDER — COVID-19 MRNA VAC-TRIS(PFIZER) 30 MCG/0.3ML IM SUSP
INTRAMUSCULAR | 0 refills | Status: DC
Start: 2020-09-24 — End: 2020-12-06
  Filled 2020-09-28: qty 0.3, 1d supply, fill #0

## 2020-10-13 LAB — HM DIABETES FOOT EXAM: HM Diabetic Foot Exam: NORMAL

## 2020-10-13 LAB — HEMOGLOBIN A1C: Hemoglobin A1C: 7.4

## 2020-10-20 DIAGNOSIS — M79641 Pain in right hand: Secondary | ICD-10-CM | POA: Diagnosis not present

## 2020-10-20 DIAGNOSIS — M1712 Unilateral primary osteoarthritis, left knee: Secondary | ICD-10-CM | POA: Diagnosis not present

## 2020-10-20 DIAGNOSIS — M1711 Unilateral primary osteoarthritis, right knee: Secondary | ICD-10-CM | POA: Diagnosis not present

## 2020-10-26 DIAGNOSIS — M19041 Primary osteoarthritis, right hand: Secondary | ICD-10-CM | POA: Diagnosis not present

## 2020-11-01 ENCOUNTER — Other Ambulatory Visit: Payer: Self-pay | Admitting: Internal Medicine

## 2020-11-04 DIAGNOSIS — M19041 Primary osteoarthritis, right hand: Secondary | ICD-10-CM | POA: Diagnosis not present

## 2020-11-11 ENCOUNTER — Encounter: Payer: Self-pay | Admitting: Internal Medicine

## 2020-11-17 DIAGNOSIS — B351 Tinea unguium: Secondary | ICD-10-CM | POA: Diagnosis not present

## 2020-11-17 DIAGNOSIS — L84 Corns and callosities: Secondary | ICD-10-CM | POA: Diagnosis not present

## 2020-11-17 DIAGNOSIS — E119 Type 2 diabetes mellitus without complications: Secondary | ICD-10-CM | POA: Diagnosis not present

## 2020-12-02 DIAGNOSIS — M19041 Primary osteoarthritis, right hand: Secondary | ICD-10-CM | POA: Diagnosis not present

## 2020-12-06 ENCOUNTER — Other Ambulatory Visit: Payer: Self-pay

## 2020-12-06 ENCOUNTER — Encounter: Payer: Self-pay | Admitting: Internal Medicine

## 2020-12-06 ENCOUNTER — Ambulatory Visit (INDEPENDENT_AMBULATORY_CARE_PROVIDER_SITE_OTHER): Payer: Medicare Other | Admitting: Internal Medicine

## 2020-12-06 VITALS — BP 126/72 | HR 59 | Temp 98.1°F | Resp 18 | Ht 65.0 in | Wt 236.0 lb

## 2020-12-06 DIAGNOSIS — E069 Thyroiditis, unspecified: Secondary | ICD-10-CM | POA: Diagnosis not present

## 2020-12-06 DIAGNOSIS — R5383 Other fatigue: Secondary | ICD-10-CM

## 2020-12-06 DIAGNOSIS — E119 Type 2 diabetes mellitus without complications: Secondary | ICD-10-CM

## 2020-12-06 DIAGNOSIS — Z0100 Encounter for examination of eyes and vision without abnormal findings: Secondary | ICD-10-CM

## 2020-12-06 DIAGNOSIS — I10 Essential (primary) hypertension: Secondary | ICD-10-CM

## 2020-12-06 NOTE — Progress Notes (Signed)
Subjective:    Patient ID: Donald Jones, male    DOB: 09/11/1945, 75 y.o.   MRN: [id number]  DOS:  12/06/2020 Type of visit - description: Routine visit Today with talk about hypertension, diabetes, chart is reviewed. Also reports that since he retired is feeling tired and sleepy. Admits that he is unmotivated, less active, denies depression or anxiety. No chest pain or difficulty breathing. Diet  has not been good at all.  Wt Readings from Last 3 Encounters:  12/06/20 236 lb (107 kg)  08/24/20 234 lb 12.8 oz (106.5 kg)  08/06/20 236 lb (107 kg)     Review of Systems See above   Past Medical History:  Diagnosis Date   Anxiety    Arthritis    hands right   Diabetes mellitus without complication (HCC)    GERD (gastroesophageal reflux disease)    Hypertension    Kidney stones    several episodes.  Last one in 2011.     Obesity    100-lb weight loss since 2011.    Polycythemia    Sleep apnea    on Cpap    Past Surgical History:  Procedure Laterality Date   CHOLECYSTECTOMY     HAND SURGERY  2010   R hand , d/t a injury, 3 surgeries     Allergies as of 12/06/2020       Reactions   Metformin And Related    Intolerant, GI symptoms   Sulfa Antibiotics Nausea And Vomiting        Medication List        Accurate as of December 06, 2020 11:59 PM. If you have any questions, ask your nurse or doctor.          STOP taking these medications    ALPRAZolam 0.5 MG tablet Commonly known as: Xanax Stopped by: Kathlene November, MD   Pfizer-BioNT COVID-19 Vac-TriS Susp injection Generic drug: COVID-19 mRNA Vac-TriS Therapist, music) Stopped by: Kathlene November, MD       TAKE these medications    albuterol 108 (90 Base) MCG/ACT inhaler Commonly known as: VENTOLIN HFA Inhale 2 puffs into the lungs every 6 (six) hours as needed for wheezing or shortness of breath.   AMBULATORY NON FORMULARY MEDICATION GI Cocktail - 90 mls of 2% Lidocaine                      90 mls Dicyclomine  '10mg'$ /68m                    270 mls Maalox Take 5-10 ml every 4-6 hours as needed   amLODipine 10 MG tablet Commonly known as: NORVASC Take 1 tablet (10 mg total) by mouth daily.   aspirin EC 81 MG tablet Take 81 mg by mouth daily. Swallow whole.   atorvastatin 40 MG tablet Commonly known as: LIPITOR Take 1 tablet (40 mg total) by mouth at bedtime.   Azelastine HCl 137 MCG/SPRAY Soln Place 2 sprays into the nose 2 (two) times daily.   diclofenac sodium 1 % Gel Commonly known as: VOLTAREN Apply 4 g topically 3 (three) times daily as needed.   dicyclomine 20 MG tablet Commonly known as: BENTYL Take 1 tablet (20 mg total) by mouth 3 (three) times daily before meals.   escitalopram 10 MG tablet Commonly known as: LEXAPRO Take 1 tablet (10 mg total) by mouth daily.   famotidine 20 MG tablet Commonly known as: PEPCID Take 1 tablet (20 mg  total) by mouth 2 (two) times daily before a meal.   FDgard 25-20.75 MG Caps Generic drug: [place] Oil-Levomenthol Take 2 tablets by mouth in the morning and at bedtime.   Flaxseed Oil 1000 MG Caps Take 2,000 mg by mouth daily.   hydrochlorothiazide 25 MG tablet Commonly known as: HYDRODIURIL TAKE 1 TABLET (25 MG TOTAL) BY MOUTH DAILY.   hydrocortisone 2.5 % cream Apply topically 2 (two) times daily.   losartan 50 MG tablet Commonly known as: COZAAR Take 1 tablet (50 mg total) by mouth daily.   multivitamin tablet Take 1 tablet by mouth daily.   omeprazole 20 MG tablet Commonly known as: PRILOSEC OTC Take 1 tablet (20 mg total) by mouth daily.   sildenafil 20 MG tablet Commonly known as: REVATIO TAKE 3 TO 4 TABLETS BY MOUTH AT BEDTIME AS NEEDED. What changed: See the new instructions.   sitaGLIPtin 100 MG tablet Commonly known as: Januvia Take 1 tablet (100 mg total) by mouth daily.           Objective:   Physical Exam BP 126/72 (BP Location: Left Arm, Patient Position: Sitting, Cuff Size: Normal)   Pulse (!) 59    Temp 98.1 F (36.7 C) (Oral)   Resp 18   Ht '5\' 5"'$  (1.651 m)   Wt 236 lb (107 kg)   SpO2 98%   BMI 39.27 kg/m  General:   Well developed, NAD, BMI noted. HEENT:  Normocephalic . Face symmetric, atraumatic Lungs:  CTA B Normal respiratory effort, no intercostal retractions, no accessory muscle use. Heart: RRR,  no murmur.  Lower extremities: no pretibial edema bilaterally  Skin: Not pale. Not jaundice Neurologic:  alert & oriented X3.  Speech normal, gait appropriate for age and unassisted Psych--  Cognition and judgment appear intact.  Cooperative with normal attention span and concentration.  Behavior appropriate. No anxious or depressed appearing.      Assessment     ASSESSMENT DM (pre-DM until 2016).  Intolerant to Metformin, see office visit 09/09/2019 HTN Polycythemia- sees hematology, likely d/t OSA-diuretics, JAK2 (-) ~ 2014, last OV 10-2014, f/u prn OSA on CPAP DJD- saw Dr Rhona Raider before  R Foot drop  GI: -GERD - IBS? GI sx on and off, previously dx w/ IBS -Cscopes: [id number], 09/18/2017 - EGD 04/2018, showed gastritis, no H. pylori, no malignancy Urolithiasis, several episodes Negative Myoview 2019, CT coronary angiography 08/2019: Minimal disease  PLAN: DM: On Januvia, not doing well with diet or exercise, patient is extensively counseled.  Check A1c. Refer to opht. HTN: Ambulatory BPs are within normal in the 120s, continue amlodipine, HCTZ and losartan. Fatigue,  sleepy:  new problem  Sxs started after he retired from a very physical job ~ 1 year ago, uses a CPAP regularly.  Denies depression, recent labs wnl.  Sxs  likely multifactorial, he plans to get more bbusy physically and mentally, we agreed to reassess the issue on RTC. Chest pain: Had symptoms last year, saw cardiology 08/24/2020, chest pain resolved, low coronary calcium score, no further eval recommended, follow-up 1 year. Subclinical hyperthyroidism: Check TFTs RTC 4 to 5 months   This  visit occurred during the SARS-CoV-2 public health emergency.  Safety protocols were in place, including screening questions prior to the visit, additional usage of staff PPE, and extensive cleaning of exam room while observing appropriate contact time as indicated for disinfecting solutions.

## 2020-12-06 NOTE — Patient Instructions (Addendum)
We have placed a referral to an eye doctor for a diabetic eye exam. It is important that you start doing these at least once yearly to protect your vision. Please expect a call in the next several days.   Check the  blood pressure  BP GOAL is between 110/65 and  135/85. If it is consistently higher or lower, let me know     GO TO THE LAB : Get the blood work     [place], [place] back for a checkup in 4 to 5 months

## 2020-12-07 LAB — T4, FREE: Free T4: 0.88 ng/dL (ref 0.60–1.60)

## 2020-12-07 LAB — TSH: TSH: 0.47 u[IU]/mL (ref 0.35–5.50)

## 2020-12-07 LAB — HEMOGLOBIN A1C: Hgb A1c MFr Bld: 7.7 % — ABNORMAL HIGH (ref 4.6–6.5)

## 2020-12-07 LAB — T3, FREE: T3, Free: 3 pg/mL (ref 2.3–4.2)

## 2020-12-07 NOTE — Assessment & Plan Note (Signed)
DM: On Januvia, not doing well with diet or exercise, patient is extensively counseled.  Check A1c. Refer to opht. HTN: Ambulatory BPs are within normal in the 120s, continue amlodipine, HCTZ and losartan. Fatigue,  sleepy:  new problem  Sxs started after he retired from a very physical job ~ 1 year ago, uses a CPAP regularly.  Denies depression, recent labs wnl.  Sxs  likely multifactorial, he plans to get more bbusy physically and mentally, we agreed to reassess the issue on RTC. Chest pain: Had symptoms last year, saw cardiology 08/24/2020, chest pain resolved, low coronary calcium score, no further eval recommended, follow-up 1 year. Subclinical hyperthyroidism: Check TFTs RTC 4 to 5 months

## 2020-12-08 MED ORDER — PIOGLITAZONE HCL 30 MG PO TABS: 30.0000 mg | ORAL_TABLET | Freq: Every day | ORAL | 6 refills | Status: DC

## 2020-12-08 NOTE — Addendum Note (Signed)
Addended byDamita Dunnings D on: 12/08/2020 03:25 PM   Modules accepted: Orders

## 2020-12-11 ENCOUNTER — Telehealth: Payer: Self-pay | Admitting: Internal Medicine

## 2020-12-11 NOTE — Telephone Encounter (Signed)
Left message for patient to call back and schedule Medicare Annual Wellness Visit (AWV) in office.   If unable to come into the office for AWV,  please offer to do virtually or by telephone.  Last AWV: 01/22/2015  Please schedule at anytime with [facility] Nurse Health Advisor.  40 minute appointment  Any questions, please contact me at [phone]

## 2020-12-11 NOTE — Telephone Encounter (Signed)
Left message for patient to call back and schedule their Medicare Annual Wellness Visit (AWV)  either virtually or by telephone.   Last AWV: 01/22/2015   Please schedule at anytime with [facility] Nurse Health Advisor and /or offer weekend in September.   40 minute appointment   Any questions, please contact me at [phone]

## 2020-12-23 ENCOUNTER — Other Ambulatory Visit (HOSPITAL_BASED_OUTPATIENT_CLINIC_OR_DEPARTMENT_OTHER): Payer: Self-pay

## 2021-01-18 ENCOUNTER — Other Ambulatory Visit: Payer: Self-pay | Admitting: Internal Medicine

## 2021-02-09 ENCOUNTER — Telehealth: Payer: Self-pay | Admitting: Internal Medicine

## 2021-02-09 NOTE — Telephone Encounter (Signed)
Pt would like to inform paz that he received his flu shot last week.

## 2021-02-09 NOTE — Telephone Encounter (Signed)
Immunization record updated.

## 2021-02-15 DIAGNOSIS — H25813 Combined forms of age-related cataract, bilateral: Secondary | ICD-10-CM | POA: Diagnosis not present

## 2021-02-15 DIAGNOSIS — D3132 Benign neoplasm of left choroid: Secondary | ICD-10-CM | POA: Diagnosis not present

## 2021-02-15 DIAGNOSIS — E119 Type 2 diabetes mellitus without complications: Secondary | ICD-10-CM | POA: Diagnosis not present

## 2021-02-15 DIAGNOSIS — H04123 Dry eye syndrome of bilateral lacrimal glands: Secondary | ICD-10-CM | POA: Diagnosis not present

## 2021-02-15 DIAGNOSIS — G4730 Sleep apnea, unspecified: Secondary | ICD-10-CM | POA: Diagnosis not present

## 2021-02-15 LAB — HM DIABETES EYE EXAM

## 2021-02-16 DIAGNOSIS — B351 Tinea unguium: Secondary | ICD-10-CM | POA: Diagnosis not present

## 2021-02-16 DIAGNOSIS — E119 Type 2 diabetes mellitus without complications: Secondary | ICD-10-CM | POA: Diagnosis not present

## 2021-03-07 ENCOUNTER — Other Ambulatory Visit: Payer: Self-pay | Admitting: Internal Medicine

## 2021-03-21 ENCOUNTER — Other Ambulatory Visit: Payer: Self-pay | Admitting: Internal Medicine

## 2021-04-05 ENCOUNTER — Other Ambulatory Visit: Payer: Self-pay | Admitting: Internal Medicine

## 2021-04-06 ENCOUNTER — Ambulatory Visit (INDEPENDENT_AMBULATORY_CARE_PROVIDER_SITE_OTHER): Payer: Medicare Other | Admitting: Internal Medicine

## 2021-04-06 ENCOUNTER — Encounter: Payer: Self-pay | Admitting: Internal Medicine

## 2021-04-06 VITALS — BP 152/84 | HR 85 | Temp 98.3°F | Resp 18 | Ht 65.0 in | Wt 237.4 lb

## 2021-04-06 DIAGNOSIS — J4520 Mild intermittent asthma, uncomplicated: Secondary | ICD-10-CM | POA: Diagnosis not present

## 2021-04-06 DIAGNOSIS — I10 Essential (primary) hypertension: Secondary | ICD-10-CM

## 2021-04-06 DIAGNOSIS — E119 Type 2 diabetes mellitus without complications: Secondary | ICD-10-CM | POA: Diagnosis not present

## 2021-04-06 DIAGNOSIS — K130 Diseases of lips: Secondary | ICD-10-CM

## 2021-04-06 DIAGNOSIS — J45909 Unspecified asthma, uncomplicated: Secondary | ICD-10-CM | POA: Insufficient documentation

## 2021-04-06 MED ORDER — KETOCONAZOLE 2 % EX CREA: 1.0000 "application " | TOPICAL_CREAM | Freq: Every day | CUTANEOUS | 0 refills | Status: DC

## 2021-04-06 MED ORDER — BUDESONIDE-FORMOTEROL FUMARATE 160-4.5 MCG/ACT IN AERO: 2.0000 | INHALATION_SPRAY | Freq: Two times a day (BID) | RESPIRATORY_TRACT | 3 refills | Status: DC

## 2021-04-06 NOTE — Progress Notes (Signed)
Subjective:    Patient ID: Donald Jones, male    DOB: 09-08-1945, 75 y.o.   MRN: [id number]  DOS:  04/06/2021 Type of visit - description: Follow-up  Routine follow-up. We addressed multiple concerns. Still feeling fatigued, today he reports history of asthma, for many years, reports wheezing with exertion lately with no chest pain.  Also wheezing when he cuts the grass and he is exposed to dust. Complains of postnasal dripping that triggers cough.  Request treatment.  Had irritation at the corners of the mouth.  BP noted to be elevated today but is normal at home.  Having problems with his CPAP machine.  Still have some edema at the lower extremities.  BP Readings from Last 3 Encounters:  04/06/21 (!) 152/84  12/06/20 126/72  08/24/20 124/72     Review of Systems See above   Past Medical History:  Diagnosis Date   Anxiety    Arthritis    hands right   Diabetes mellitus without complication (HCC)    GERD (gastroesophageal reflux disease)    Hypertension    Kidney stones    several episodes.  Last one in 2011.     Obesity    100-lb weight loss since 2011.    Polycythemia    Sleep apnea    on Cpap    Past Surgical History:  Procedure Laterality Date   CHOLECYSTECTOMY     HAND SURGERY  2010   R hand , d/t a injury, 3 surgeries     Allergies as of 04/06/2021       Reactions   Metformin And Related    Intolerant, GI symptoms   Sulfa Antibiotics Nausea And Vomiting        Medication List        Accurate as of April 06, 2021 11:59 PM. If you have any questions, ask your nurse or doctor.          albuterol 108 (90 Base) MCG/ACT inhaler Commonly known as: VENTOLIN HFA Inhale 2 puffs into the lungs every 6 (six) hours as needed for wheezing or shortness of breath.   AMBULATORY NON FORMULARY MEDICATION GI Cocktail - 90 mls of 2% Lidocaine                      90 mls Dicyclomine 10mg /72ml                    270 mls Maalox Take 5-10 ml  every 4-6 hours as needed   amLODipine 10 MG tablet Commonly known as: NORVASC TAKE 1 TABLET BY MOUTH DAILY.   aspirin EC 81 MG tablet Take 81 mg by mouth daily. Swallow whole.   atorvastatin 40 MG tablet Commonly known as: LIPITOR TAKE 1 TABLET BY MOUTH AT BEDTIME.   Azelastine HCl 137 MCG/SPRAY Soln Place 2 sprays into the nose 2 (two) times daily.   budesonide-formoterol 160-4.5 MCG/ACT inhaler Commonly known as: SYMBICORT Inhale 2 puffs into the lungs 2 (two) times daily. Started by: Kathlene November, MD   diclofenac sodium 1 % Gel Commonly known as: VOLTAREN Apply 4 g topically 3 (three) times daily as needed.   dicyclomine 20 MG tablet Commonly known as: BENTYL Take 1 tablet (20 mg total) by mouth 3 (three) times daily before meals.   escitalopram 10 MG tablet Commonly known as: LEXAPRO TAKE 1 TABLET (10 MG TOTAL) BY MOUTH DAILY.   famotidine 20 MG tablet Commonly known as: PEPCID Take 1 tablet (  20 mg total) by mouth 2 (two) times daily before a meal.   FDgard 25-20.75 MG Caps Generic drug: [place] Oil-Levomenthol Take 2 tablets by mouth in the morning and at bedtime.   Flaxseed Oil 1000 MG Caps Take 2,000 mg by mouth daily.   hydrochlorothiazide 25 MG tablet Commonly known as: HYDRODIURIL TAKE 1 TABLET (25 MG TOTAL) BY MOUTH DAILY.   hydrocortisone 2.5 % cream Apply topically 2 (two) times daily.   Januvia 100 MG tablet Generic drug: sitaGLIPtin TAKE 1 TABLET (100 MG TOTAL) BY MOUTH DAILY.   ketoconazole 2 % cream Commonly known as: NIZORAL Apply 1 application topically daily. Started by: Kathlene November, MD   losartan 50 MG tablet Commonly known as: COZAAR TAKE 1 TABLET (50 MG TOTAL) BY MOUTH DAILY.   multivitamin tablet Take 1 tablet by mouth daily.   omeprazole 20 MG tablet Commonly known as: PRILOSEC OTC Take 1 tablet (20 mg total) by mouth daily.   pioglitazone 30 MG tablet Commonly known as: Actos Take 1 tablet (30 mg total) by mouth daily.    sildenafil 20 MG tablet Commonly known as: REVATIO TAKE 3 TO 4 TABLETS BY MOUTH AT BEDTIME AS NEEDED. What changed: See the new instructions.           Objective:   Physical Exam BP (!) 152/84 (BP Location: Left Arm, Patient Position: Sitting, Cuff Size: Normal)    Pulse 85    Temp 98.3 F (36.8 C) (Oral)    Resp 18    Ht 5\' 5"  (1.651 m)    Wt 237 lb 6 oz (107.7 kg)    SpO2 96%    BMI 39.50 kg/m  General:   Well developed, NAD, BMI noted. HEENT:  Normocephalic . Face symmetric, atraumatic. TMs: Normal.  Throat symmetric not red.  Nose minimal congestion. + Erythema and small cracks at the corners of the mouth Lungs:  CTA B Normal respiratory effort, no intercostal retractions, no accessory muscle use. Heart: RRR,  no murmur.  Lower extremities: Trace pretibial edema bilaterally  Skin: Not pale. Not jaundice Neurologic:  alert & oriented X3.  Speech normal, gait appropriate for age and unassisted Psych--  Cognition and judgment appear intact.  Cooperative with normal attention span and concentration.  Behavior appropriate. No anxious or depressed appearing.      Assessment    ASSESSMENT DM (pre-DM until 2016).  Intolerant to Metformin, see office visit 09/09/2019 HTN Polycythemia- sees hematology, likely d/t OSA-diuretics, JAK2 (-) ~ 2014, last OV 10-2014, f/u prn OSA on CPAP DJD- saw Dr Rhona Raider before  R Foot drop  GI: -GERD - IBS? GI sx on and off, previously dx w/ IBS -Cscopes: [id number], 09/18/2017 - EGD 04/2018, showed gastritis, no H. pylori, no malignancy Urolithiasis, several episodes Negative Myoview 2019, CT coronary angiography 08/2019: Minimal disease  PLAN: DM: Last A1c was 7.7, in addition to Januvia I recommended Actos, check A1c. HTN: BP today slightly elevated, at home reports normal readings.  Continue amlodipine, HCTZ, losartan.  Reassess in 3 months.  Check BMP. Lower extremity edema: Recommend compression stockings, not severe at this point,  consider adjust or decrease amlodipine. Asthma: New issue, reports he knew he had asthma for years, first time he mentions to me.  Reports wheezing with exertion but no chest pain.  Has albuterol inhaler that rarely uses. Plan: Symbicort twice daily, rinsing the throat.  Reassess in 3 months. Fatigue, sleepy: See last visit, no major changes reassess after we treat asthma. Angular cheilitis:  See physical exam, treat with antifungals. Allergies: Postnasal dripping reported, continue Astepro, Flonase, try Allegra OTC Recommend COVID-vaccine RTC 3 months   This visit occurred during the SARS-CoV-2 public health emergency.  Safety protocols were in place, including screening questions prior to the visit, additional usage of staff PPE, and extensive cleaning of exam room while observing appropriate contact time as indicated for disinfecting solutions.

## 2021-04-06 NOTE — Patient Instructions (Addendum)
Per our records you are due for your diabetic eye exam. Please contact your eye doctor to schedule an appointment. Please have them send copies of your office visit notes to [place]. Our fax number is [phone]. If you need a referral to an eye doctor please let us know.  Please consider COVID-vaccine.  Check the  blood pressure regularly BP GOAL is between 110/65 and  135/85. If it is consistently higher or lower, let me know  For asthma: Symbicort 2 puffs twice daily every day. Rinse your throat after you use it  You still can use the albuterol inhaler if you have wheezing.  For the irritation in the corner of your mouth: Nizoral twice a day for 10 days.  Call if no better  For the postnasal dripping: Astepro twice daily Flonase once daily Consider getting Allegra over-the-counter   GO TO THE LAB : Get the blood work     [place], [place] back for   a checkup in 3 months

## 2021-04-07 LAB — BASIC METABOLIC PANEL
BUN: 18 mg/dL (ref 6–23)
CO2: 32 mEq/L (ref 19–32)
Calcium: 10.5 mg/dL (ref 8.4–10.5)
Chloride: 101 mEq/L (ref 96–112)
Creatinine, Ser: 1.25 mg/dL (ref 0.40–1.50)
GFR: 57.19 mL/min — ABNORMAL LOW (ref >60.00)
Glucose, Bld: 105 mg/dL — ABNORMAL HIGH (ref 70–99)
Potassium: 4.3 mEq/L (ref 3.5–5.1)
Sodium: 138 mEq/L (ref 135–145)

## 2021-04-07 LAB — HEMOGLOBIN A1C: Hgb A1c MFr Bld: 6.8 % — ABNORMAL HIGH (ref 4.6–6.5)

## 2021-04-07 NOTE — Assessment & Plan Note (Signed)
DM: Last A1c was 7.7, in addition to Januvia I recommended Actos, check A1c. HTN: BP today slightly elevated, at home reports normal readings.  Continue amlodipine, HCTZ, losartan.  Reassess in 3 months.  Check BMP. Lower extremity edema: Recommend compression stockings, not severe at this point, consider adjust or decrease amlodipine. Asthma: New issue, reports he knew he had asthma for years, first time he mentions to me.  Reports wheezing with exertion but no chest pain.  Has albuterol inhaler that rarely uses. Plan: Symbicort twice daily, rinsing the throat.  Reassess in 3 months. Fatigue, sleepy: See last visit, no major changes reassess after we treat asthma. Angular cheilitis: See physical exam, treat with antifungals. Allergies: Postnasal dripping reported, continue Astepro, Flonase, try Allegra OTC Recommend COVID-vaccine RTC 3 months

## 2021-04-22 ENCOUNTER — Ambulatory Visit: Payer: Medicare Other | Admitting: Pulmonary Disease

## 2021-04-29 ENCOUNTER — Ambulatory Visit (INDEPENDENT_AMBULATORY_CARE_PROVIDER_SITE_OTHER): Payer: Medicare Other

## 2021-04-29 ENCOUNTER — Ambulatory Visit
Admission: EM | Admit: 2021-04-29 | Discharge: 2021-04-29 | Disposition: A | Discharge status: Home / Self Care | Payer: Medicare Other | Attending: Internal Medicine | Admitting: Internal Medicine

## 2021-04-29 ENCOUNTER — Other Ambulatory Visit: Payer: Self-pay

## 2021-04-29 DIAGNOSIS — R053 Chronic cough: Secondary | ICD-10-CM

## 2021-04-29 DIAGNOSIS — J069 Acute upper respiratory infection, unspecified: Secondary | ICD-10-CM

## 2021-04-29 DIAGNOSIS — R059 Cough, unspecified: Secondary | ICD-10-CM

## 2021-04-29 MED ORDER — BENZONATATE 100 MG PO CAPS: 100.0000 mg | ORAL_CAPSULE | Freq: Three times a day (TID) | ORAL | 0 refills | Status: DC | PRN

## 2021-04-29 MED ORDER — FLUTICASONE PROPIONATE 50 MCG/ACT NA SUSP: 1.0000 | Freq: Every day | NASAL | 0 refills | Status: DC

## 2021-04-29 NOTE — ED Triage Notes (Signed)
Pt c/o cough, nasal congestion with drainage, bilat earache, pressure behind eyes,   Denies nausea, vomiting, diarrhea, constipation   Onset ~ 3 weeks ago

## 2021-04-29 NOTE — ED Provider Notes (Signed)
[facility]    CSN: [id number] Arrival date & time: 04/29/21  1132      History   Chief Complaint Chief Complaint  Patient presents with   Cough    HPI Donald Jones is a 76 y.o. male.   Patient presents with 3-week history of nasal congestion and cough.  Cough is productive with sputum per patient.  Bilateral ear pain started today.  Denies any known fevers or sick contacts.  Denies chest pain, shortness of breath, sore throat, nausea, vomiting, diarrhea, abdominal pain.  Patient has taken Tussin and Tylenol for symptoms with minimal improvement.  He does have history of asthma and uses Albuterol inhaler twice daily per prescription.   Cough  Past Medical History:  Diagnosis Date   Anxiety    Arthritis    hands right   Diabetes mellitus without complication (HCC)    GERD (gastroesophageal reflux disease)    Hypertension    Kidney stones    several episodes.  Last one in 2011.     Obesity    100-lb weight loss since 2011.    Polycythemia    Sleep apnea    on Cpap    Patient Active Problem List   Diagnosis Date Noted   Asthma 04/06/2021   Anxiety 07/07/2019   High cholesterol 01/20/2018   DJD (degenerative joint disease) 09/06/2017   Erectile dysfunction 09/06/2017   PCP NOTES >>> 01/23/2015   Subclinical hyperthyroidism 10/05/2014   Diabetes mellitus without complication ([place]) 67/59/1638   Obesity (BMI 30-39.9) 12/03/2012   GERD (gastroesophageal reflux disease) 09/24/2012   Skin lesion 09/24/2012   IBS ? 04/22/2012   Polycythemia    Annual physical exam 06/05/2011   Foot drop, right 06/05/2011   HYPERTENSION, BENIGN SYSTEMIC 06/14/2006   RHINITIS, ALLERGIC 06/14/2006   NEPHROLITHIASIS 06/14/2006   OSA (obstructive sleep apnea) 06/14/2006    Past Surgical History:  Procedure Laterality Date   CHOLECYSTECTOMY     HAND SURGERY  2010   R hand , d/t a injury, 3 surgeries        Home Medications    Prior to Admission medications    Medication Sig Start Date End Date Taking? Authorizing Provider  benzonatate (TESSALON) 100 MG capsule Take 1 capsule (100 mg total) by mouth every 8 (eight) hours as needed for cough. 04/29/21  Yes Tyleah Loh, Michele Rockers, FNP  fluticasone (FLONASE) 50 MCG/ACT nasal spray Place 1 spray into both nostrils daily for 3 days. 04/29/21 05/02/21 Yes Kaysie Michelini, Michele Rockers, FNP  albuterol (VENTOLIN HFA) 108 (90 Base) MCG/ACT inhaler Inhale 2 puffs into the lungs every 6 (six) hours as needed for wheezing or shortness of breath. 08/06/20   Colon Branch, MD  AMBULATORY NON FORMULARY MEDICATION GI Cocktail - 90 mls of 2% Lidocaine                      90 mls Dicyclomine 10mg /1ml                    270 mls Maalox Take 5-10 ml every 4-6 hours as needed Patient taking differently: GI Cocktail - 90 mls of 2% Lidocaine                      90 mls Dicyclomine 10mg /65ml                    270 mls Maalox Take 5-10 ml every 4-6 hours as needed  07/25/19   Barrett, Evelene Croon, PA-C  amLODipine (NORVASC) 10 MG tablet TAKE 1 TABLET BY MOUTH DAILY. 01/18/21   Colon Branch, MD  aspirin EC 81 MG tablet Take 81 mg by mouth daily. Swallow whole.    [provider]  atorvastatin (LIPITOR) 40 MG tablet TAKE 1 TABLET BY MOUTH AT BEDTIME. 04/05/21   Colon Branch, MD  Azelastine HCl 137 MCG/SPRAY SOLN Place 2 sprays into the nose 2 (two) times daily. 07/23/18   Colon Branch, MD  budesonide-formoterol [facility]) 160-4.5 MCG/ACT inhaler Inhale 2 puffs into the lungs 2 (two) times daily. 04/06/21   Colon Branch, MD  [place] Oil-Levomenthol (FDGARD) 25-20.75 MG CAPS Take 2 tablets by mouth in the morning and at bedtime.     [provider]  diclofenac sodium (VOLTAREN) 1 % GEL Apply 4 g topically 3 (three) times daily as needed. 05/01/18   Colon Branch, MD  dicyclomine (BENTYL) 20 MG tablet Take 1 tablet (20 mg total) by mouth 3 (three) times daily before meals. 07/20/20   Colon Branch, MD  escitalopram (LEXAPRO) 10 MG tablet TAKE 1 TABLET (10 MG  TOTAL) BY MOUTH DAILY. 03/07/21   Colon Branch, MD  famotidine (PEPCID) 20 MG tablet Take 1 tablet (20 mg total) by mouth 2 (two) times daily before a meal. 07/26/20   Paz, Alda Berthold, MD  Flaxseed, Linseed, (FLAXSEED OIL) 1000 MG CAPS Take 2,000 mg by mouth daily.     [provider]  hydrochlorothiazide (HYDRODIURIL) 25 MG tablet TAKE 1 TABLET (25 MG TOTAL) BY MOUTH DAILY. 11/01/20   Colon Branch, MD  hydrocortisone 2.5 % cream Apply topically 2 (two) times daily. 08/21/16   Paz, Alda Berthold, MD  JANUVIA 100 MG tablet TAKE 1 TABLET (100 MG TOTAL) BY MOUTH DAILY. 03/21/21   Colon Branch, MD  ketoconazole (NIZORAL) 2 % cream Apply 1 application topically daily. 04/06/21   Colon Branch, MD  losartan (COZAAR) 50 MG tablet TAKE 1 TABLET (50 MG TOTAL) BY MOUTH DAILY. 03/21/21   Colon Branch, MD  Multiple Vitamin (MULTIVITAMIN) tablet Take 1 tablet by mouth daily.    [provider]  omeprazole (PRILOSEC OTC) 20 MG tablet Take 1 tablet (20 mg total) by mouth daily. 07/27/20   Colon Branch, MD  pioglitazone (ACTOS) 30 MG tablet Take 1 tablet (30 mg total) by mouth daily. 12/08/20   Colon Branch, MD  sildenafil (REVATIO) 20 MG tablet TAKE 3 TO 4 TABLETS BY MOUTH AT BEDTIME AS NEEDED. Patient taking differently: Take 60-80 mg by mouth at bedtime as needed. 12/30/18   Colon Branch, MD    Family History Family History  Problem Relation Age of Onset   Diabetes Mother    Coronary artery disease Mother 22   Stroke Father        M and F   Hypertension Father    Heart disease Sister        ?   Alcohol abuse Sister    Colon cancer Sister 60       age ~ 63   Prostate cancer Neg Hx    Stomach cancer Neg Hx    Rectal cancer Neg Hx    Esophageal cancer Neg Hx     Social History Social History   Tobacco Use   Smoking status: Never   Smokeless tobacco: Never  Vaping Use   Vaping Use: Never used  Substance Use Topics  Alcohol use: No    Comment: none for 15 years   Drug use: No     Allergies    Metformin and related and Sulfa antibiotics   Review of Systems Review of Systems Per HPI  Physical Exam Triage Vital Signs ED Triage Vitals [04/29/21 1144]  Enc Vitals Group     BP 136/67     Pulse Rate 63     Resp 18     Temp 98 F (36.7 C)     Temp Source Oral     SpO2 96 %     Weight      Height      Head Circumference      Peak Flow      Pain Score 0     Pain Loc      Pain Edu?      Excl. in [place]?    No data found.  Updated Vital Signs BP 136/67 (BP Location: Left Arm)    Pulse 63    Temp 98 F (36.7 C) (Oral)    Resp 18    SpO2 96%   Visual Acuity Right Eye Distance:   Left Eye Distance:   Bilateral Distance:    Right Eye Near:   Left Eye Near:    Bilateral Near:     Physical Exam Constitutional:      General: He is not in acute distress.    Appearance: Normal appearance. He is not toxic-appearing or diaphoretic.  HENT:     Head: Normocephalic and atraumatic.     Right Ear: Tympanic membrane and ear canal normal.     Left Ear: Tympanic membrane and ear canal normal.     Nose: Congestion present.     Mouth/Throat:     Mouth: Mucous membranes are moist.     Pharynx: No posterior oropharyngeal erythema.  Eyes:     Extraocular Movements: Extraocular movements intact.     Conjunctiva/sclera: Conjunctivae normal.     Pupils: Pupils are equal, round, and reactive to light.  Cardiovascular:     Rate and Rhythm: Normal rate and regular rhythm.     Pulses: Normal pulses.     Heart sounds: Normal heart sounds.  Pulmonary:     Effort: Pulmonary effort is normal. No respiratory distress.     Breath sounds: Normal breath sounds. No stridor. No wheezing, rhonchi or rales.  Abdominal:     General: Abdomen is flat. Bowel sounds are normal.     Palpations: Abdomen is soft.  Musculoskeletal:        General: Normal range of motion.     Cervical back: Normal range of motion.  Skin:    General: Skin is warm and dry.  Neurological:     General: No focal  deficit present.     Mental Status: He is alert and oriented to person, place, and time. Mental status is at baseline.  Psychiatric:        Mood and Affect: Mood normal.        Behavior: Behavior normal.     UC Treatments / Results  Labs (all labs ordered are listed, but only abnormal results are displayed) Labs Reviewed - No data to display  EKG   Radiology DG Chest 2 View  Result Date: 04/29/2021 CLINICAL DATA:  Persistent cough EXAM: CHEST - 2 VIEW COMPARISON:  03/13/2020 FINDINGS: Cardiac size is within normal limits. Thoracic aorta is tortuous. There are no signs of pulmonary edema or focal pulmonary consolidation. There  is no pleural effusion or pneumothorax. IMPRESSION: No active cardiopulmonary disease. Electronically Signed   By: Elmer Picker M.D.   On: 04/29/2021 12:24    Procedures Procedures (including critical care time)  Medications Ordered in UC Medications - No data to display  Initial Impression / Assessment and Plan / UC Course  I have reviewed the triage vital signs and the nursing notes.  Pertinent labs & imaging results that were available during my care of the patient were reviewed by me and considered in my medical decision making (see chart for details).     Patient presents with symptoms likely from a viral upper respiratory infection. Differential includes bacterial pneumonia, sinusitis, allergic rhinitis, COVID-19, flu. Do not suspect underlying cardiopulmonary process. Symptoms seem unlikely related to ACS, CHF or COPD exacerbations, pneumonia, pneumothorax. Patient is nontoxic appearing and not in need of emergent medical intervention.  Do not think that viral testing is necessary given duration of symptoms.  Recommended symptom control with over the counter medications.  Benzonatate and Flonase prescribed for patient.  Do think the patient would benefit from prednisone steroid given duration of symptoms but patient has diabetes and is not sure  if he has been ever been able to tolerate prednisone previously.  Will defer prednisone at this time.   Chest x-ray is negative for any acute cardiopulmonary process.  It showed torturous aorta and patient was advised to follow-up with PCP for this.  Return if symptoms fail to improve.  Patient states understanding and is agreeable.  Discharged with PCP followup.  Final Clinical Impressions(s) / UC Diagnoses   Final diagnoses:  Viral upper respiratory infection  Persistent cough for 3 weeks or longer     Discharge Instructions      Your chest x-ray was normal.  You have been prescribed 2 medications to help alleviate symptoms.     ED Prescriptions     Medication Sig Dispense Auth. Provider   fluticasone (FLONASE) 50 MCG/ACT nasal spray Place 1 spray into both nostrils daily for 3 days. 16 g Kymber Kosar, Hildred Alamin E, Di[place]   benzonatate (TESSALON) 100 MG capsule Take 1 capsule (100 mg total) by mouth every 8 (eight) hours as needed for cough. 21 capsule [place], Michele Rockers, [place]      PDMP not reviewed this encounter.   Teodora Medici,  04/29/21 1253

## 2021-04-29 NOTE — Discharge Instructions (Signed)
Your chest x-ray was normal.  You have been prescribed 2 medications to help alleviate symptoms.

## 2021-05-02 ENCOUNTER — Other Ambulatory Visit: Payer: Self-pay | Admitting: Internal Medicine

## 2021-05-06 ENCOUNTER — Telehealth: Payer: Self-pay | Admitting: Internal Medicine

## 2021-05-06 NOTE — Telephone Encounter (Signed)
Left message for patient to call back and schedule Medicare Annual Wellness Visit (AWV) in office.   If not able to come in office, please offer to do virtually or by telephone.  Left office number and my jabber [phone].  Last AWV:01/22/2015  Please schedule at anytime with Nurse Health Advisor.

## 2021-05-09 ENCOUNTER — Other Ambulatory Visit: Payer: Self-pay | Admitting: Internal Medicine

## 2021-05-16 DIAGNOSIS — M2011 Hallux valgus (acquired), right foot: Secondary | ICD-10-CM | POA: Diagnosis not present

## 2021-05-16 DIAGNOSIS — L989 Disorder of the skin and subcutaneous tissue, unspecified: Secondary | ICD-10-CM | POA: Diagnosis not present

## 2021-05-16 DIAGNOSIS — E119 Type 2 diabetes mellitus without complications: Secondary | ICD-10-CM | POA: Diagnosis not present

## 2021-05-16 DIAGNOSIS — L853 Xerosis cutis: Secondary | ICD-10-CM | POA: Diagnosis not present

## 2021-05-16 DIAGNOSIS — B351 Tinea unguium: Secondary | ICD-10-CM | POA: Diagnosis not present

## 2021-05-24 ENCOUNTER — Other Ambulatory Visit: Payer: Self-pay

## 2021-05-24 LAB — HM DIABETES FOOT EXAM: HM Diabetic Foot Exam: NEGATIVE

## 2021-05-24 LAB — HEMOGLOBIN A1C: Hemoglobin A1C: 6.5

## 2021-05-24 MED ORDER — PIOGLITAZONE HCL 30 MG PO TABS: 30.0000 mg | ORAL_TABLET | Freq: Every day | ORAL | 1 refills | Status: DC

## 2021-05-25 LAB — HM HEPATITIS C SCREENING LAB: HM Hepatitis Screen: NEGATIVE

## 2021-05-30 ENCOUNTER — Other Ambulatory Visit: Payer: Self-pay

## 2021-05-30 ENCOUNTER — Encounter: Payer: Self-pay | Admitting: Pulmonary Disease

## 2021-05-30 ENCOUNTER — Ambulatory Visit: Payer: Medicare Other | Admitting: Pulmonary Disease

## 2021-05-30 VITALS — BP 126/78 | HR 54 | Temp 98.0°F | Ht 65.0 in | Wt 244.6 lb

## 2021-05-30 DIAGNOSIS — G4733 Obstructive sleep apnea (adult) (pediatric): Secondary | ICD-10-CM

## 2021-05-30 DIAGNOSIS — J4520 Mild intermittent asthma, uncomplicated: Secondary | ICD-10-CM | POA: Diagnosis not present

## 2021-05-30 NOTE — Patient Instructions (Signed)
°  X Rx for new medium nasal mask  X CPAP report  Get CPAP checked outby [facility]

## 2021-05-30 NOTE — Assessment & Plan Note (Signed)
Appears mild intermittent unrelieved by albuterol. Refills will be provided on albuterol as needed

## 2021-05-30 NOTE — Progress Notes (Signed)
° °  Subjective:    Patient ID: Donald Jones, male    DOB: 1945-05-12, 76 y.o.   MRN: [id number]  HPI  76 yo for follow-up of obstructive sleep apnea and asthma OSA was diagnosed in 2003    PMH -polycythemia  Annual follow-up visit He has been on CPAP for more than 20 years.  His current machine seems to stop working at night spontaneously, due to this he was afraid of CPAP starting off and he has stopped using the machine.  Otherwise he had settled down well with his AirFit F30 fullface mask.  He wonders if he needs to transition to a nasal mask.  He denies any problems with pressure  We reviewed previous CPAP download going back 3 months and he seems to have better compliance then on auto settings 8 to 15 cm with residual AHI 15/hour with few central events 4 /hour  He reports intermittent asthma symptoms especially when he cuts grass and feels like his chest is tight and he cannot breathe, relieved by albuterol  Past Medical History:  Diagnosis Date   Anxiety    Arthritis    hands right   Diabetes mellitus without complication (HCC)    GERD (gastroesophageal reflux disease)    Hypertension    Kidney stones    several episodes.  Last one in 2011.     Obesity    100-lb weight loss since 2011.    Polycythemia    Sleep apnea    on Cpap     Review of Systems neg for any significant sore throat, dysphagia, itching, sneezing, nasal congestion or excess/ purulent secretions, fever, chills, sweats, unintended wt loss, pleuritic or exertional cp, hempoptysis, orthopnea pnd or change in chronic leg swelling. Also denies presyncope, palpitations, heartburn, abdominal pain, nausea, vomiting, diarrhea or change in bowel or urinary habits, dysuria,hematuria, rash, arthralgias, visual complaints, headache, numbness weakness or ataxia.     Objective:   Physical Exam  Gen. Pleasant, obese, in no distress ENT - no lesions, no post nasal drip Neck: No JVD, no thyromegaly, no carotid  bruits Lungs: Barrel chested, no use of accessory muscles, no dullness to percussion, decreased without rales or rhonchi  Cardiovascular: Rhythm regular, heart sounds  normal, no murmurs or gallops, no peripheral edema Musculoskeletal: No deformities, no cyanosis or clubbing , no tremors        Assessment & Plan:

## 2021-05-30 NOTE — Assessment & Plan Note (Signed)
We will ask DME to check out his machine. We will provide him a prescription for new nasal mask Previous CPAP download was checked on 8 to 15 cm and he still has residual events.  He may need higher pressures and may even need a repeat CPAP titration study  Weight loss encouraged, compliance with goal of at least 4-6 hrs every night is the expectation. Advised against medications with sedative side effects Cautioned against driving when sleepy - understanding that sleepiness will vary on a day to day basis

## 2021-06-04 DIAGNOSIS — G4733 Obstructive sleep apnea (adult) (pediatric): Secondary | ICD-10-CM | POA: Diagnosis not present

## 2021-06-06 ENCOUNTER — Encounter: Payer: Self-pay | Admitting: Internal Medicine

## 2021-06-27 ENCOUNTER — Encounter: Payer: Self-pay | Admitting: Internal Medicine

## 2021-06-27 ENCOUNTER — Other Ambulatory Visit: Payer: Self-pay | Admitting: Internal Medicine

## 2021-06-29 ENCOUNTER — Telehealth (INDEPENDENT_AMBULATORY_CARE_PROVIDER_SITE_OTHER): Payer: Medicare Other | Admitting: Internal Medicine

## 2021-06-29 ENCOUNTER — Encounter: Payer: Self-pay | Admitting: Internal Medicine

## 2021-06-29 VITALS — BP 133/72 | HR 58 | Temp 97.4°F | Ht 65.0 in | Wt 244.0 lb

## 2021-06-29 DIAGNOSIS — J4531 Mild persistent asthma with (acute) exacerbation: Secondary | ICD-10-CM

## 2021-06-29 MED ORDER — FLUTICASONE PROPIONATE 50 MCG/ACT NA SUSP: 1.0000 | Freq: Every day | NASAL | 12 refills | Status: DC

## 2021-06-29 NOTE — Progress Notes (Signed)
? ?Subjective:  ? ? Patient ID: Donald Jones, male    DOB: March 21, 1946, 76 y.o.   MRN: [id number] ? ?DOS:  06/29/2021 ?Type of visit - description: Virtual Visit via Video Note ? ?I connected with the above patient  by a video enabled telemedicine application and verified that I am speaking with the correct person using two identifiers. ?  ?THIS ENCOUNTER IS A VIRTUAL VISIT DUE TO COVID-19 - PATIENT WAS NOT SEEN IN THE OFFICE. PATIENT HAS CONSENTED TO VIRTUAL VISIT / TELEMEDICINE VISIT ?  ?Location of patient: home ? ?Location of provider: office ? ?Persons participating in the virtual visit: patient, provider  ? ?I discussed the limitations of evaluation and management by telemedicine and the availability of in person appointments. The patient expressed understanding and agreed to proceed. ? ?Acute ?Symptoms a started last week after he was outdoors. ?Cough, some wheezing. ?Itchy eyes and nose. ?+ Postnasal dripping ?Cough, mostly early in the mornings and late in the afternoon with occasional greenish/clear sputum. ?He started to use albuterol as needed and that has helped. ?At this point he feels better ? ?Denies fever chills ?No nausea or vomiting ?Mild aches and pains ? ?Review of Systems ?See above  ? ?Past Medical History:  ?Diagnosis Date  ? Anxiety   ? Arthritis   ? hands right  ? Diabetes mellitus without complication ([place])   ? GERD (gastroesophageal reflux disease)   ? Hypertension   ? Kidney stones   ? several episodes.  Last one in 2011.    ? Obesity   ? 100-lb weight loss since 2011.   ? Polycythemia   ? Sleep apnea   ? on Cpap  ? ? ?Past Surgical History:  ?Procedure Laterality Date  ? CHOLECYSTECTOMY    ? HAND SURGERY  2010  ? R hand , d/t a injury, 3 surgeries   ? ? ?Current Outpatient Medications  ?Medication Instructions  ? albuterol (VENTOLIN HFA) 108 (90 Base) MCG/ACT inhaler 2 puffs, Inhalation, Every 6 hours PRN  ? AMBULATORY NON FORMULARY MEDICATION GI Cocktail - 90 mls of 2% Lidocaine<BR>                      90 mls Dicyclomine '10mg'$ /26m<BR>                   270 mls Maalox<BR>Take 5-10 ml every 4-6 hours as needed  ? amLODipine (NORVASC) 10 MG tablet TAKE 1 TABLET BY MOUTH DAILY.  ? aspirin EC 81 mg, Oral, Daily, Swallow whole.   ? atorvastatin (LIPITOR) 40 MG tablet TAKE 1 TABLET BY MOUTH AT BEDTIME.  ? Azelastine HCl 137 MCG/SPRAY SOLN 2 sprays, Nasal, 2 times daily  ? benzonatate (TESSALON) 100 mg, Oral, Every 8 hours PRN  ? budesonide-formoterol (SYMBICORT) 160-4.5 MCG/ACT inhaler 2 puffs, Inhalation, 2 times daily  ? [place] Oil-Levomenthol (FDGARD) 25-20.75 MG CAPS 2 tablets, Oral, 2 times daily  ? diclofenac sodium (VOLTAREN) 4 g, Topical, 3 times daily PRN  ? dicyclomine (BENTYL) 20 MG tablet TAKE 1 TABLET BY MOUTH 3 TIMES DAILY BEFORE MEALS.  ? escitalopram (LEXAPRO) 10 mg, Oral, Daily  ? famotidine (PEPCID) 20 mg, Oral, 2 times daily before meals  ? Flaxseed Oil 2,000 mg, Oral, Daily  ? fluticasone (FLONASE) 50 MCG/ACT nasal spray 1 spray, Each Nare, Daily  ? hydrochlorothiazide (HYDRODIURIL) 25 MG tablet TAKE 1 TABLET BY MOUTH DAILY.  ? hydrocortisone 2.5 % cream Topical, 2 times daily  ? JANUVIA 100  MG tablet TAKE 1 TABLET (100 MG TOTAL) BY MOUTH DAILY.  ? ketoconazole (NIZORAL) 2 % cream 1 application., Topical, Daily  ? losartan (COZAAR) 50 mg, Oral, Daily  ? Multiple Vitamin (MULTIVITAMIN) tablet 1 tablet, Oral, Daily  ? omeprazole (PRILOSEC) 20 MG capsule TAKE 1 CAPSULE (20 MG TOTAL) BY MOUTH DAILY.  ? pioglitazone (ACTOS) 30 mg, Oral, Daily  ? sildenafil (REVATIO) 20 MG tablet TAKE 3 TO 4 TABLETS BY MOUTH AT BEDTIME AS NEEDED.  ? ? ?   ?Objective:  ? Physical Exam ?BP 133/72   Pulse (!) 58   Temp (!) 97.4 ?F (36.3 ?C) (Oral)   Ht '5\' 5"'$  (1.651 m)   Wt 244 lb (110.7 kg)   SpO2 96%   BMI 40.60 kg/m?  ?This is a virtual video visit, he is alert oriented x3, in no distress, speaking in complete sentences, no sneezing, cough or chest congestion noted. ?   ?Assessment   ? ?   ?ASSESSMENT ?DM (pre-DM until 2016).  Intolerant to Metformin, see office visit 09/09/2019 ?HTN ?Polycythemia- sees hematology, likely d/t OSA-diuretics, JAK2 (-) ~ 2014, last OV 10-2014, f/u prn ?OSA on CPAP ?DJD- saw Dr Rhona Raider before  ?R Foot drop  ?GI: ?-GERD ?- IBS? GI sx on and off, previously dx w/ IBS ?-Cscopes: 7425,9563, 09/18/2017 ?- EGD 04/2018, showed gastritis, no H. pylori, no malignancy ?Urolithiasis, several episodes ?Negative Myoview 2019, CT coronary angiography 08/2019: Minimal disease ? ?PLAN: ?Asthma exacerbation ?Seems like he has a mild asthma exacerbation triggered by being outdoors (pollen already present).  Overall he feels better, no recent fever. ?Plan: ?Consistent use of Symbicort 2 puffs twice daily and albuterol as needed. ?Consistent use of Flonase , rx sent  ?As needed Robitussin or Mucinex DM and Benadryl. ?Call if not gradually better ? ? ?  ?I discussed the assessment and treatment plan with the patient. The patient was provided an opportunity to ask questions and all were answered. The patient agreed with the plan and demonstrated an understanding of the instructions. ?  ?The patient was advised to call back or seek an in-person evaluation if the symptoms worsen or if the condition fails to improve as anticipated. ? ?  ?

## 2021-06-30 NOTE — Assessment & Plan Note (Signed)
Asthma exacerbation ?Seems like he has a mild asthma exacerbation triggered by being outdoors (pollen already present).  Overall he feels better, no recent fever. ?Plan: ?Consistent use of Symbicort 2 puffs twice daily and albuterol as needed. ?Consistent use of Flonase , rx sent  ?As needed Robitussin or Mucinex DM and Benadryl. ?Call if not gradually better ?

## 2021-07-06 ENCOUNTER — Encounter: Payer: Self-pay | Admitting: Internal Medicine

## 2021-07-06 ENCOUNTER — Ambulatory Visit (INDEPENDENT_AMBULATORY_CARE_PROVIDER_SITE_OTHER): Payer: Medicare Other | Admitting: Internal Medicine

## 2021-07-06 VITALS — BP 132/80 | HR 55 | Temp 97.9°F | Resp 18 | Ht 65.0 in | Wt 243.1 lb

## 2021-07-06 DIAGNOSIS — E119 Type 2 diabetes mellitus without complications: Secondary | ICD-10-CM | POA: Diagnosis not present

## 2021-07-06 DIAGNOSIS — J4520 Mild intermittent asthma, uncomplicated: Secondary | ICD-10-CM | POA: Diagnosis not present

## 2021-07-06 DIAGNOSIS — E7800 Pure hypercholesterolemia, unspecified: Secondary | ICD-10-CM | POA: Diagnosis not present

## 2021-07-06 DIAGNOSIS — I10 Essential (primary) hypertension: Secondary | ICD-10-CM

## 2021-07-06 NOTE — Progress Notes (Signed)
? ?Subjective:  ? ? Patient ID: Donald Jones, male    DOB: 20-Apr-1945, 76 y.o.   MRN: [id number] ? ?DOS:  07/06/2021 ?Type of visit - description: Follow-up ? ?In general feels well. ?Reports that since the pollen season started he is having a lot of sinus congestion, postnasal dripping. ?That typically pools  mucus in the throat and create cough. ?Asthma per se is controlled with no recent wheezing. ? ?Admits to itchy eyes and nose, occasionally right ear ache.  No sneezing ? ?Review of Systems ?See above  ? ?Past Medical History:  ?Diagnosis Date  ? Anxiety   ? Arthritis   ? hands right  ? Diabetes mellitus without complication ([place])   ? GERD (gastroesophageal reflux disease)   ? Hypertension   ? Kidney stones   ? several episodes.  Last one in 2011.    ? Obesity   ? 100-lb weight loss since 2011.   ? Polycythemia   ? Sleep apnea   ? on Cpap  ? ? ?Past Surgical History:  ?Procedure Laterality Date  ? CHOLECYSTECTOMY    ? HAND SURGERY  2010  ? R hand , d/t a injury, 3 surgeries   ? ? ?Current Outpatient Medications  ?Medication Instructions  ? albuterol (VENTOLIN HFA) 108 (90 Base) MCG/ACT inhaler 2 puffs, Inhalation, Every 6 hours PRN  ? AMBULATORY NON FORMULARY MEDICATION GI Cocktail - 90 mls of 2% Lidocaine<BR>                     90 mls Dicyclomine '10mg'$ /75m<BR>                   270 mls Maalox<BR>Take 5-10 ml every 4-6 hours as needed  ? amLODipine (NORVASC) 10 MG tablet TAKE 1 TABLET BY MOUTH DAILY.  ? aspirin EC 81 mg, Oral, Daily, Swallow whole.   ? atorvastatin (LIPITOR) 40 MG tablet TAKE 1 TABLET BY MOUTH AT BEDTIME.  ? Azelastine HCl 137 MCG/SPRAY SOLN 2 sprays, Nasal, 2 times daily  ? benzonatate (TESSALON) 100 mg, Oral, Every 8 hours PRN  ? budesonide-formoterol (SYMBICORT) 160-4.5 MCG/ACT inhaler 2 puffs, Inhalation, 2 times daily  ? [place] Oil-Levomenthol (FDGARD) 25-20.75 MG CAPS 2 tablets, Oral, 2 times daily  ? diclofenac sodium (VOLTAREN) 4 g, Topical, 3 times daily PRN  ? dicyclomine (BENTYL)  20 MG tablet TAKE 1 TABLET BY MOUTH 3 TIMES DAILY BEFORE MEALS.  ? escitalopram (LEXAPRO) 10 mg, Oral, Daily  ? famotidine (PEPCID) 20 mg, Oral, 2 times daily before meals  ? Flaxseed Oil 2,000 mg, Oral, Daily  ? fluticasone (FLONASE) 50 MCG/ACT nasal spray 1 spray, Each Nare, Daily  ? hydrochlorothiazide (HYDRODIURIL) 25 MG tablet TAKE 1 TABLET BY MOUTH DAILY.  ? hydrocortisone 2.5 % cream Topical, 2 times daily  ? JANUVIA 100 MG tablet TAKE 1 TABLET (100 MG TOTAL) BY MOUTH DAILY.  ? ketoconazole (NIZORAL) 2 % cream 1 application., Topical, Daily  ? losartan (COZAAR) 50 mg, Oral, Daily  ? Multiple Vitamin (MULTIVITAMIN) tablet 1 tablet, Oral, Daily  ? omeprazole (PRILOSEC) 20 MG capsule TAKE 1 CAPSULE (20 MG TOTAL) BY MOUTH DAILY.  ? pioglitazone (ACTOS) 30 mg, Oral, Daily  ? sildenafil (REVATIO) 20 MG tablet TAKE 3 TO 4 TABLETS BY MOUTH AT BEDTIME AS NEEDED.  ? ? ?   ?Objective:  ? Physical Exam ?BP 132/80 (BP Location: Left Arm, Patient Position: Sitting, Cuff Size: Normal)   Pulse (!) 55   Temp 97.9 ?F (  36.6 ?C) (Oral)   Resp 18   Ht '5\' 5"'$  (1.651 m)   Wt 243 lb 2 oz (110.3 kg)   SpO2 98%   BMI 40.46 kg/m?  ?General:   ?Well developed, NAD, BMI noted. ?HEENT:  ?Normocephalic . Face symmetric, atraumatic. ?TMs normal.  Throat not red.  Symmetric ?Lungs:  ?CTA B.  No wheezing. ?Normal respiratory effort, no intercostal retractions, no accessory muscle use. ?Heart: RRR,  no murmur.  ?Lower extremities: Trace pretibial edema bilaterally  ?Skin: Not pale. Not jaundice ?Neurologic:  ?alert & oriented X3.  ?Speech normal, gait appropriate for age and unassisted ?Psych--  ?Cognition and judgment appear intact.  ?Cooperative with normal attention span and concentration.  ?Behavior appropriate. ?No anxious or depressed appearing.  ? ?   ?Assessment   ? ?ASSESSMENT ?DM (pre-DM until 2016).  Intolerant to Metformin, see office visit 09/09/2019 ?HTN ?Polycythemia- sees hematology, likely d/t OSA-diuretics, JAK2 (-) ~  2014, last OV 10-2014, f/u prn ?OSA on CPAP ?DJD- saw Dr Rhona Raider before  ?R Foot drop  ?GI: ?-GERD ?- IBS? GI sx on and off, previously dx w/ IBS ?-Cscopes: 1157,2620, 09/18/2017 ?- EGD 04/2018, showed gastritis, no H. pylori, no malignancy ?Urolithiasis, several episodes ?Negative Myoview 2019, CT coronary angiography 08/2019: Minimal disease ? ?PLAN: ?BT:DHRC A1c satisfactory continue Januvia, Actos.  Ambulatory CBGs in the morning range from 105-140 ?HTN: BP is very good, last BMP okay, continue amlodipine, HCTZ, losartan. ?Lower extremity edema: Minimal at this point. ?High cholesterol: On Lipitor 40 mg, checking labs. ?Polycythemia: Checking a CBC today ?Asthma: Seems well controlled.  Uses rescue inhalers appropriately. ?Allergies: Again recommend Flonase, Astepro, Allegra.  See instructions. ?RTC CPX 4 months ?  ? ?This visit occurred during the SARS-CoV-2 public health emergency.  Safety protocols were in place, including screening questions prior to the visit, additional usage of staff PPE, and extensive cleaning of exam room while observing appropriate contact time as indicated for disinfecting solutions.  ? ?

## 2021-07-06 NOTE — Patient Instructions (Addendum)
Check the  blood pressure regularly ?BP GOAL is between 110/65 and  135/85. ?If it is consistently higher or lower, let me know ? ?For allergies: ?Flonase 2 sprays on each side of the nose every day ?Astepro: 2 sprays on each side of the nose twice daily ?Allegra over-the-counter daily ? ?GO TO THE LAB : Get the blood work   ? ? ?[place], [place] ?Come back for a physical exam in 4 months  ?

## 2021-07-07 LAB — CBC WITH DIFFERENTIAL/PLATELET
Basophils Absolute: 0.1 10*3/uL (ref 0.0–0.1)
Basophils Relative: 0.9 % (ref 0.0–3.0)
Eosinophils Absolute: 0.2 10*3/uL (ref 0.0–0.7)
Eosinophils Relative: 3 % (ref 0.0–5.0)
HCT: 46.3 % (ref 39.0–52.0)
Hemoglobin: 15.4 g/dL (ref 13.0–17.0)
Lymphocytes Relative: 29.5 % (ref 12.0–46.0)
Lymphs Abs: 2.3 10*3/uL (ref 0.7–4.0)
MCHC: 33.2 g/dL (ref 30.0–36.0)
MCV: 95.2 fl (ref 78.0–100.0)
Monocytes Absolute: 0.7 10*3/uL (ref 0.1–1.0)
Monocytes Relative: 9.5 % (ref 3.0–12.0)
Neutro Abs: 4.4 10*3/uL (ref 1.4–7.7)
Neutrophils Relative %: 57.1 % (ref 43.0–77.0)
Platelets: 238 10*3/uL (ref 150.0–400.0)
RBC: 4.87 Mil/uL (ref 4.22–5.81)
RDW: 13.1 % (ref 11.5–15.5)
WBC: 7.6 10*3/uL (ref 4.0–10.5)

## 2021-07-07 LAB — LIPID PANEL
Cholesterol: 108 mg/dL (ref 0–200)
HDL: 52 mg/dL (ref >39.00)
LDL Cholesterol: 49 mg/dL (ref 0–99)
NonHDL: 56.41
Total CHOL/HDL Ratio: 2
Triglycerides: 38 mg/dL (ref 0.0–149.0)
VLDL: 7.6 mg/dL (ref 0.0–40.0)

## 2021-07-07 LAB — AST: AST: 28 U/L (ref 0–37)

## 2021-07-07 LAB — ALT: ALT: 37 U/L (ref 0–53)

## 2021-07-07 NOTE — Assessment & Plan Note (Signed)
EK:CMKL A1c satisfactory continue Januvia, Actos.  Ambulatory CBGs in the morning range from 105-140 ?HTN: BP is very good, last BMP okay, continue amlodipine, HCTZ, losartan. ?Lower extremity edema: Minimal at this point. ?High cholesterol: On Lipitor 40 mg, checking labs. ?Polycythemia: Checking a CBC today ?Asthma: Seems well controlled.  Uses rescue inhalers appropriately. ?Allergies: Again recommend Flonase, Astepro, Allegra.  See instructions. ?RTC CPX 4 months ?  ?

## 2021-07-12 ENCOUNTER — Encounter: Payer: Self-pay | Admitting: Internal Medicine

## 2021-07-18 ENCOUNTER — Other Ambulatory Visit: Payer: Self-pay | Admitting: Internal Medicine

## 2021-08-10 DIAGNOSIS — E119 Type 2 diabetes mellitus without complications: Secondary | ICD-10-CM | POA: Diagnosis not present

## 2021-08-10 DIAGNOSIS — L853 Xerosis cutis: Secondary | ICD-10-CM | POA: Diagnosis not present

## 2021-08-10 DIAGNOSIS — B351 Tinea unguium: Secondary | ICD-10-CM | POA: Diagnosis not present

## 2021-08-10 DIAGNOSIS — L989 Disorder of the skin and subcutaneous tissue, unspecified: Secondary | ICD-10-CM | POA: Diagnosis not present

## 2021-08-27 ENCOUNTER — Other Ambulatory Visit: Payer: Self-pay | Admitting: Internal Medicine

## 2021-09-01 ENCOUNTER — Ambulatory Visit (INDEPENDENT_AMBULATORY_CARE_PROVIDER_SITE_OTHER): Payer: Medicare Other | Admitting: Family Medicine

## 2021-09-01 ENCOUNTER — Encounter: Payer: Self-pay | Admitting: Family Medicine

## 2021-09-01 VITALS — BP 112/68 | HR 61 | Temp 97.8°F | Resp 18 | Ht 65.0 in | Wt 254.6 lb

## 2021-09-01 DIAGNOSIS — L03116 Cellulitis of left lower limb: Secondary | ICD-10-CM

## 2021-09-01 DIAGNOSIS — R0602 Shortness of breath: Secondary | ICD-10-CM | POA: Diagnosis not present

## 2021-09-01 DIAGNOSIS — R600 Localized edema: Secondary | ICD-10-CM

## 2021-09-01 DIAGNOSIS — L309 Dermatitis, unspecified: Secondary | ICD-10-CM | POA: Insufficient documentation

## 2021-09-01 MED ORDER — FUROSEMIDE 40 MG PO TABS: 40.0000 mg | ORAL_TABLET | Freq: Every day | ORAL | 3 refills | Status: DC

## 2021-09-01 MED ORDER — DOXYCYCLINE HYCLATE 100 MG PO TABS: 100.0000 mg | ORAL_TABLET | Freq: Two times a day (BID) | ORAL | 0 refills | Status: DC

## 2021-09-01 NOTE — Progress Notes (Addendum)
Subjective:   By signing my name below, I, Donald Jones, attest that this documentation has been prepared under the direction and in the presence of Donald Held, DO  09/01/2021     Patient ID: Donald Jones, male    DOB: October 16, 1945, 76 y.o.   MRN: [id number]  Chief Complaint  Patient presents with   Foot Swelling    Pt states having foot swelling and states having a injury to the left shin.    HPI Patient is in today for a office visit.   He complains of swelling in his legs since this summer season started. His symptoms worsen after walking or standing. He continues taking 10 mg amlodipine daily PO, 25 mg hydrochlorothiazide daily PO and reports no new issues while taking them. He is also elevating his feet regularly and reports his swelling reduces.  He also complains of a cut on her left leg since Friday. He was playing with his grandchildren and they accidentally scratched him causing him to bleed. He bandaged the wound but found it continued draining through the weekend.  He also complains of difficulty breathing recently. He has a history of asthma. He also cut his grass recently. He finds his symptoms worsen after he cuts his grass. He wears a mask while cutting grass and found it helps. He also took his albuterol inhaler after cutting his grass and found relief.  He regularly checks his blood sugars at home and reports they are measuring well.  Lab Results  Component Value Date   HGBA1C 6.5 05/24/2021    Past Medical History:  Diagnosis Date   Anxiety    Arthritis    hands right   Diabetes mellitus without complication ([facility])    GERD (gastroesophageal reflux disease)    Hypertension    Kidney stones    several episodes.  Last one in 2011.     Obesity    100-lb weight loss since 2011.    Polycythemia    Sleep apnea    on Cpap    Past Surgical History:  Procedure Laterality Date   CHOLECYSTECTOMY     HAND SURGERY  2010   R hand , d/t a injury, 3  surgeries     Family History  Problem Relation Age of Onset   Diabetes Mother    Coronary artery disease Mother 19   Stroke Father        M and F   Hypertension Father    Heart disease Sister        ?   Alcohol abuse Sister    Colon cancer Sister 95       age ~ 1   Prostate cancer Neg Hx    Stomach cancer Neg Hx    Rectal cancer Neg Hx    Esophageal cancer Neg Hx     Social History   Socioeconomic History   Marital status: Married    Spouse name: Not on file   Number of children: Not on file   Years of education: Not on file   Highest education level: Not on file  Occupational History   Occupation: RETIREd ---[employer], 2021  Tobacco Use   Smoking status: Never   Smokeless tobacco: Never  Vaping Use   Vaping Use: Never used  Substance and Sexual Activity   Alcohol use: No    Comment: none for 15 years   Drug use: No   Sexual activity: Not on file  Other Topics Concern  Not on file  Social History Narrative   1ST WIFE DIED FROM LUNG CANCER, 2ND WIFE DIED FROM RENAL CANCER.     Married x 4 , lives w/ wife   3 sisters and 3 brother : lost 2 sisters    Social Determinants of Radio broadcast assistant Strain: Not on file  Food Insecurity: Not on file  Transportation Needs: Not on file  Physical Activity: Not on file  Stress: Not on file  Social Connections: Not on file  Intimate Partner Violence: Not on file    Outpatient Medications Prior to Visit  Medication Sig Dispense Refill   albuterol (VENTOLIN HFA) 108 (90 Base) MCG/ACT inhaler Inhale 2 puffs into the lungs every 6 (six) hours as needed for wheezing or shortness of breath. 18 g 5   AMBULATORY NON FORMULARY MEDICATION GI Cocktail - 90 mls of 2% Lidocaine                      90 mls Dicyclomine '10mg'$ /15m                    270 mls Maalox Take 5-10 ml every 4-6 hours as needed (Patient taking differently: GI Cocktail - 90 mls of 2% Lidocaine                      90 mls Dicyclomine '10mg'$ /570m                    270 mls Maalox Take 5-10 ml every 4-6 hours as needed) 120 mL 0   amLODipine (NORVASC) 10 MG tablet TAKE 1 TABLET BY MOUTH DAILY. 90 tablet 1   aspirin EC 81 MG tablet Take 81 mg by mouth daily. Swallow whole.     atorvastatin (LIPITOR) 40 MG tablet TAKE 1 TABLET BY MOUTH AT BEDTIME. 90 tablet 1   Azelastine HCl 137 MCG/SPRAY SOLN Place 2 sprays into the nose 2 (two) times daily. 30 mL 6   budesonide-formoterol (SYMBICORT) 160-4.5 MCG/ACT inhaler Inhale 2 puffs into the lungs 2 (two) times daily. 1 each 3   [place] Oil-Levomenthol (FDGARD) 25-20.75 MG CAPS Take 2 tablets by mouth in the morning and at bedtime.      diclofenac sodium (VOLTAREN) 1 % GEL Apply 4 g topically 3 (three) times daily as needed. 100 g 3   dicyclomine (BENTYL) 20 MG tablet TAKE 1 TABLET BY MOUTH 3 TIMES DAILY BEFORE MEALS. 270 tablet 1   escitalopram (LEXAPRO) 10 MG tablet TAKE 1 TABLET (10 MG TOTAL) BY MOUTH DAILY. 90 tablet 1   famotidine (PEPCID) 20 MG tablet TAKE 1 TABLET BY MOUTH 2 TIMES DAILY BEFORE A MEAL. 180 tablet 1   Flaxseed, Linseed, (FLAXSEED OIL) 1000 MG CAPS Take 2,000 mg by mouth daily.      fluticasone (FLONASE) 50 MCG/ACT nasal spray Place 1 spray into both nostrils daily. 16 g 12   hydrocortisone 2.5 % cream Apply topically 2 (two) times daily. 60 g 1   JANUVIA 100 MG tablet TAKE 1 TABLET (100 MG TOTAL) BY MOUTH DAILY. 90 tablet 1   ketoconazole (NIZORAL) 2 % cream Apply 1 application topically daily. 30 g 0   losartan (COZAAR) 50 MG tablet TAKE 1 TABLET (50 MG TOTAL) BY MOUTH DAILY. 90 tablet 1   Multiple Vitamin (MULTIVITAMIN) tablet Take 1 tablet by mouth daily.     omeprazole (PRILOSEC) 20 MG capsule TAKE 1 CAPSULE (20 MG TOTAL)  BY MOUTH DAILY. 90 capsule 1   pioglitazone (ACTOS) 30 MG tablet Take 1 tablet (30 mg total) by mouth daily. 90 tablet 1   sildenafil (REVATIO) 20 MG tablet TAKE 3 TO 4 TABLETS BY MOUTH AT BEDTIME AS NEEDED. (Patient taking differently: Take 60-80 mg by mouth  at bedtime as needed.) 30 tablet 3   hydrochlorothiazide (HYDRODIURIL) 25 MG tablet TAKE 1 TABLET BY MOUTH DAILY. 90 tablet 1   No facility-administered medications prior to visit.    Allergies  Allergen Reactions   Metformin And Related     Intolerant, GI symptoms   Sulfa Antibiotics Nausea And Vomiting    Review of Systems  Constitutional:  Negative for fever and malaise/fatigue.  HENT:  Negative for congestion.   Eyes:  Negative for blurred vision.  Respiratory:  Positive for shortness of breath. Negative for cough and wheezing.   Cardiovascular:  Positive for leg swelling. Negative for chest pain and palpitations.  Gastrointestinal:  Negative for abdominal pain, blood in stool and nausea.  Genitourinary:  Negative for dysuria and frequency.  Musculoskeletal:  Negative for falls.  Skin:  Negative for rash.       (+)wound on left leg  Neurological:  Negative for dizziness, loss of consciousness and headaches.  Endo/Heme/Allergies:  Negative for environmental allergies.  Psychiatric/Behavioral:  Negative for depression. The patient is not nervous/anxious.       Objective:    Physical Exam Vitals and nursing note reviewed.  Constitutional:      General: He is not in acute distress.    Appearance: Normal appearance. He is not ill-appearing.  HENT:     Head: Normocephalic and atraumatic.     Right Ear: External ear normal.     Left Ear: External ear normal.  Eyes:     Extraocular Movements: Extraocular movements intact.     Pupils: Pupils are equal, round, and reactive to light.  Cardiovascular:     Rate and Rhythm: Normal rate and regular rhythm.     Heart sounds: Normal heart sounds. No murmur heard.   No gallop.  Pulmonary:     Effort: Pulmonary effort is normal. No respiratory distress.     Breath sounds: Normal breath sounds. No wheezing, rhonchi or rales.  Musculoskeletal:     Right lower leg: 3+ Pitting Edema present.     Left lower leg: 3+ Pitting Edema  present.     Comments: Redness on lower legs. L>R  Skin:    General: Skin is warm and dry.          Comments: + errythema and abrasion  Warm to touch   Neurological:     Mental Status: He is alert and oriented to person, place, and time.  Psychiatric:        Judgment: Judgment normal.    BP 112/68 (BP Location: Left Arm, Patient Position: Sitting, Cuff Size: Large)   Pulse 61   Temp 97.8 F (36.6 C) (Oral)   Resp 18   Ht '5\' 5"'$  (1.651 m)   Wt 254 lb 9.6 oz (115.5 kg)   SpO2 96%   BMI 42.37 kg/m  Wt Readings from Last 3 Encounters:  09/01/21 254 lb 9.6 oz (115.5 kg)  07/06/21 243 lb 2 oz (110.3 kg)  06/29/21 244 lb (110.7 kg)    Diabetic Foot Exam - Simple   No data filed    Lab Results  Component Value Date   WBC 7.6 07/06/2021   HGB 15.4 07/06/2021  HCT 46.3 07/06/2021   PLT 238.0 07/06/2021   GLUCOSE 105 (H) 04/06/2021   CHOL 108 07/06/2021   TRIG 38.0 07/06/2021   HDL 52.00 07/06/2021   LDLCALC 49 07/06/2021   ALT 37 07/06/2021   AST 28 07/06/2021   NA 138 04/06/2021   K 4.3 04/06/2021   CL 101 04/06/2021   CREATININE 1.25 04/06/2021   BUN 18 04/06/2021   CO2 32 04/06/2021   TSH 0.47 12/06/2020   PSA 1.37 08/06/2020   HGBA1C 6.5 05/24/2021   MICROALBUR 0.7 03/06/2018    Lab Results  Component Value Date   TSH 0.47 12/06/2020   Lab Results  Component Value Date   WBC 7.6 07/06/2021   HGB 15.4 07/06/2021   HCT 46.3 07/06/2021   MCV 95.2 07/06/2021   PLT 238.0 07/06/2021   Lab Results  Component Value Date   NA 138 04/06/2021   K 4.3 04/06/2021   CO2 32 04/06/2021   GLUCOSE 105 (H) 04/06/2021   BUN 18 04/06/2021   CREATININE 1.25 04/06/2021   BILITOT 1.7 (H) 08/06/2020   ALKPHOS 80 07/07/2019   AST 28 07/06/2021   ALT 37 07/06/2021   PROT 6.9 08/06/2020   ALBUMIN 4.3 07/07/2019   CALCIUM 10.5 04/06/2021   ANIONGAP 10 07/25/2019   GFR 57.19 (L) 04/06/2021   Lab Results  Component Value Date   CHOL 108 07/06/2021   Lab  Results  Component Value Date   HDL 52.00 07/06/2021   Lab Results  Component Value Date   LDLCALC 49 07/06/2021   Lab Results  Component Value Date   TRIG 38.0 07/06/2021   Lab Results  Component Value Date   CHOLHDL 2 07/06/2021   Lab Results  Component Value Date   HGBA1C 6.5 05/24/2021       Assessment & Plan:   Problem List Items Addressed This Visit       Unprioritized   Lower extremity edema    D/c hctz and start lasix Keep legs elevated  Compression socks  F/u pcp 2 weeks        Relevant Medications   furosemide (LASIX) 40 MG tablet   Cellulitis of left lower extremity - Primary    abx oint and dressing applied F/u pcp 2 weeks        Relevant Medications   doxycycline (VIBRA-TABS) 100 MG tablet   Other Visit Diagnoses     SOB (shortness of breath)       Relevant Orders   DG Chest 2 View        Meds ordered this encounter  Medications   doxycycline (VIBRA-TABS) 100 MG tablet    Sig: Take 1 tablet (100 mg total) by mouth 2 (two) times daily.    Dispense:  20 tablet    Refill:  0   furosemide (LASIX) 40 MG tablet    Sig: Take 1 tablet (40 mg total) by mouth daily.    Dispense:  30 tablet    Refill:  3    I, Donald Held, DO, personally preformed the services described in this documentation.  All medical record entries made by the scribe were at my direction and in my presence.  I have reviewed the chart and discharge instructions (if applicable) and agree that the record reflects my personal performance and is accurate and complete. 09/01/2021   I,Donald Jones,acting as a scribe for Donald Held, DO.,have documented all relevant documentation on the behalf of Rosalita Chessman  Chase, DO,as directed by  Donald Held, DO while in the presence of Donald Held, DO.   Donald Held, DO

## 2021-09-01 NOTE — Assessment & Plan Note (Signed)
D/c hctz and start lasix Keep legs elevated  Compression socks  F/u pcp 2 weeks

## 2021-09-01 NOTE — Assessment & Plan Note (Signed)
abx oint and dressing applied F/u pcp 2 weeks

## 2021-09-01 NOTE — Patient Instructions (Signed)
Edema  Edema is an abnormal buildup of fluids in the body tissues and under the skin. Swelling of the legs, feet, and ankles is a common symptom that becomes more likely as you get older. Swelling is also common in looser tissues, such as around the eyes. Pressing on the area may make a temporary dent in your skin (pitting edema). This fluid may also accumulate in your lungs (pulmonary edema). There are many possible causes of edema. Eating too much salt (sodium) and being on your feet or sitting for a long time can cause edema in your legs, feet, and ankles. Common causes of edema include: Certain medical conditions, such as heart failure, liver or kidney disease, and cancer. Weak leg blood vessels. An injury. Pregnancy. Medicines. Being obese. Low protein levels in the blood. Hot weather may make edema worse. Edema is usually painless. Your skin may look swollen or shiny. Follow these instructions at home: Medicines Take over-the-counter and prescription medicines only as told by your health care provider. Your health care provider may prescribe a medicine to help your body get rid of extra water (diuretic). Take this medicine if you are told to take it. Eating and drinking Eat a low-salt (low-sodium) diet to reduce fluid as told by your health care provider. Sometimes, eating less salt may reduce swelling. Depending on the cause of your swelling, you may need to limit how much fluid you drink (fluid restriction). General instructions Raise (elevate) the injured area above the level of your heart while you are sitting or lying down. Do not sit still or stand for long periods of time. Do not wear tight clothing. Do not wear garters on your upper legs. Exercise your legs to get your circulation going. This helps to move the fluid back into your blood vessels, and it may help the swelling go down. Wear compression stockings as told by your health care provider. These stockings help to prevent  blood clots and reduce swelling in your legs. It is important that these are the correct size. These stockings should be prescribed by your health care provider to prevent possible injuries. If elastic bandages or wraps are recommended, use them as told by your health care provider. Contact a health care provider if: Your edema does not get better with treatment. You have heart, liver, or kidney disease and have symptoms of edema. You have sudden and unexplained weight gain. Get help right away if: You develop shortness of breath or chest pain. You cannot breathe when you lie down. You develop pain, redness, or warmth in the swollen areas. You have heart, liver, or kidney disease and suddenly get edema. You have a fever and your symptoms suddenly get worse. These symptoms may be an emergency. Get help right away. Call 911. Do not wait to see if the symptoms will go away. Do not drive yourself to the hospital. Summary Edema is an abnormal buildup of fluids in the body tissues and under the skin. Eating too much salt (sodium)and being on your feet or sitting for a long time can cause edema in your legs, feet, and ankles. Raise (elevate) the injured area above the level of your heart while you are sitting or lying down. Follow your health care provider's instructions about diet and how much fluid you can drink. This information is not intended to replace advice given to you by your health care provider. Make sure you discuss any questions you have with your health care provider. Document Revised: 12/06/2020 Document   Reviewed: 12/06/2020 Elsevier Patient Education  2023 [employer]  

## 2021-09-05 ENCOUNTER — Other Ambulatory Visit: Payer: Self-pay | Admitting: Internal Medicine

## 2021-09-06 ENCOUNTER — Ambulatory Visit (INDEPENDENT_AMBULATORY_CARE_PROVIDER_SITE_OTHER): Payer: Medicare Other

## 2021-09-06 VITALS — BP 137/70 | HR 57 | Temp 97.9°F | Resp 16 | Ht 65.0 in | Wt 252.0 lb

## 2021-09-06 DIAGNOSIS — Z0000 Encounter for general adult medical examination without abnormal findings: Secondary | ICD-10-CM | POA: Diagnosis not present

## 2021-09-06 NOTE — Patient Instructions (Signed)
Donald Jones , Thank you for taking time to come for your Medicare Wellness Visit. I appreciate your ongoing commitment to your health goals. Please review the following plan we discussed and let me know if I can assist you in the future.   Screening recommendations/referrals: Colonoscopy: 09/18/17 due 09/19/27 Recommended yearly ophthalmology/optometry visit for glaucoma screening and checkup Recommended yearly dental visit for hygiene and checkup  Vaccinations: Influenza vaccine: up to date Pneumococcal vaccine: up to date Tdap vaccine: up to date Shingles vaccine: up to date   Covid-19: Due-May obtain vaccine at  your local pharmacy.   Advanced directives: no, packet given  Conditions/risks identified: see problem list   Next appointment: Follow up in one year for your annual wellness visit.   Preventive Care 74 Years and Older, Male Preventive care refers to lifestyle choices and visits with your health care provider that can promote health and wellness. What does preventive care include? A yearly physical exam. This is also called an annual well check. Dental exams once or twice a year. Routine eye exams. Ask your health care provider how often you should have your eyes checked. Personal lifestyle choices, including: Daily care of your teeth and gums. Regular physical activity. Eating a healthy diet. Avoiding tobacco and drug use. Limiting alcohol use. Practicing safe sex. Taking low doses of aspirin every day. Taking vitamin and mineral supplements as recommended by your health care provider. What happens during an annual well check? The services and screenings done by your health care provider during your annual well check will depend on your age, overall health, lifestyle risk factors, and family history of disease. Counseling  Your health care provider may ask you questions about your: Alcohol use. Tobacco use. Drug use. Emotional well-being. Home and relationship  well-being. Sexual activity. Eating habits. History of falls. Memory and ability to understand (cognition). Work and work Statistician. Screening  You may have the following tests or measurements: Height, weight, and BMI. Blood pressure. Lipid and cholesterol levels. These may be checked every 5 years, or more frequently if you are over 74 years old. Skin check. Lung cancer screening. You may have this screening every year starting at age 74 if you have a 30-pack-year history of smoking and currently smoke or have quit within the past 15 years. Fecal occult blood test (FOBT) of the stool. You may have this test every year starting at age 74. Flexible sigmoidoscopy or colonoscopy. You may have a sigmoidoscopy every 5 years or a colonoscopy every 10 years starting at age 74. Prostate cancer screening. Recommendations will vary depending on your family history and other risks. Hepatitis C blood test. Hepatitis B blood test. Sexually transmitted disease (STD) testing. Diabetes screening. This is done by checking your blood sugar (glucose) after you have not eaten for a while (fasting). You may have this done every 1-3 years. Abdominal aortic aneurysm (AAA) screening. You may need this if you are a current or former smoker. Osteoporosis. You may be screened starting at age 74 if you are at high risk. Talk with your health care provider about your test results, treatment options, and if necessary, the need for more tests. Vaccines  Your health care provider may recommend certain vaccines, such as: Influenza vaccine. This is recommended every year. Tetanus, diphtheria, and acellular pertussis (Tdap, Td) vaccine. You may need a Td booster every 10 years. Zoster vaccine. You may need this after age 74. Pneumococcal 13-valent conjugate (PCV13) vaccine. One dose is recommended after age 74.  Pneumococcal polysaccharide (PPSV23) vaccine. One dose is recommended after age 74. Talk to your health care  provider about which screenings and vaccines you need and how often you need them. This information is not intended to replace advice given to you by your health care provider. Make sure you discuss any questions you have with your health care provider. Document Released: 04/30/2015 Document Revised: 12/22/2015 Document Reviewed: 02/02/2015 Elsevier Interactive Patient Education  2017 [place] Prevention in the Home Falls can cause injuries. They can happen to people of all ages. There are many things you can do to make your home safe and to help prevent falls. What can I do on the outside of my home? Regularly fix the edges of walkways and driveways and fix any cracks. Remove anything that might make you trip as you walk through a door, such as a raised step or threshold. Trim any bushes or trees on the path to your home. Use bright outdoor lighting. Clear any walking paths of anything that might make someone trip, such as rocks or tools. Regularly check to see if handrails are loose or broken. Make sure that both sides of any steps have handrails. Any raised decks and porches should have guardrails on the edges. Have any leaves, snow, or ice cleared regularly. Use sand or salt on walking paths during winter. Clean up any spills in your garage right away. This includes oil or grease spills. What can I do in the bathroom? Use night lights. Install grab bars by the toilet and in the tub and shower. Do not use towel bars as grab bars. Use non-skid mats or decals in the tub or shower. If you need to sit down in the shower, use a plastic, non-slip stool. Keep the floor dry. Clean up any water that spills on the floor as soon as it happens. Remove soap buildup in the tub or shower regularly. Attach bath mats securely with double-sided non-slip rug tape. Do not have throw rugs and other things on the floor that can make you trip. What can I do in the bedroom? Use night lights. Make  sure that you have a light by your bed that is easy to reach. Do not use any sheets or blankets that are too big for your bed. They should not hang down onto the floor. Have a firm chair that has side arms. You can use this for support while you get dressed. Do not have throw rugs and other things on the floor that can make you trip. What can I do in the kitchen? Clean up any spills right away. Avoid walking on wet floors. Keep items that you use a lot in easy-to-reach places. If you need to reach something above you, use a strong step stool that has a grab bar. Keep electrical cords out of the way. Do not use floor polish or wax that makes floors slippery. If you must use wax, use non-skid floor wax. Do not have throw rugs and other things on the floor that can make you trip. What can I do with my stairs? Do not leave any items on the stairs. Make sure that there are handrails on both sides of the stairs and use them. Fix handrails that are broken or loose. Make sure that handrails are as long as the stairways. Check any carpeting to make sure that it is firmly attached to the stairs. Fix any carpet that is loose or worn. Avoid having throw rugs at the  top or bottom of the stairs. If you do have throw rugs, attach them to the floor with carpet tape. Make sure that you have a light switch at the top of the stairs and the bottom of the stairs. If you do not have them, ask someone to add them for you. What else can I do to help prevent falls? Wear shoes that: Do not have high heels. Have rubber bottoms. Are comfortable and fit you well. Are closed at the toe. Do not wear sandals. If you use a stepladder: Make sure that it is fully opened. Do not climb a closed stepladder. Make sure that both sides of the stepladder are locked into place. Ask someone to hold it for you, if possible. Clearly mark and make sure that you can see: Any grab bars or handrails. First and last steps. Where the  edge of each step is. Use tools that help you move around (mobility aids) if they are needed. These include: Canes. Walkers. Scooters. Crutches. Turn on the lights when you go into a dark area. Replace any light bulbs as soon as they burn out. Set up your furniture so you have a clear path. Avoid moving your furniture around. If any of your floors are uneven, fix them. If there are any pets around you, be aware of where they are. Review your medicines with your doctor. Some medicines can make you feel dizzy. This can increase your chance of falling. Ask your doctor what other things that you can do to help prevent falls. This information is not intended to replace advice given to you by your health care provider. Make sure you discuss any questions you have with your health care provider. Document Released: 01/28/2009 Document Revised: 09/09/2015 Document Reviewed: 05/08/2014 Elsevier Interactive Patient Education  2017 [employer].

## 2021-09-06 NOTE — Progress Notes (Addendum)
Subjective:   Donald Jones is a 76 y.o. male who presents for an Initial Medicare Annual Wellness Visit.  Review of Systems     Cardiac Risk Factors include: advanced age (>72mn, >>67women);hypertension;diabetes mellitus     Objective:    Today's Vitals   09/06/21 1304 09/06/21 1305  BP: 137/70   Pulse: (!) 57   Resp: 16   Temp: 97.9 F (36.6 C)   SpO2: 96%   Weight: 252 lb (114.3 kg)   Height: '5\' 5"'$  (1.651 m)   PainSc:  2    Body mass index is 41.93 kg/m.     09/06/2021    1:04 PM 07/24/2019   11:00 PM 07/04/2019    5:57 AM 05/27/2019    5:33 PM 04/03/2018    9:44 AM 08/10/2017    8:29 AM 11/15/2016    6:43 AM  Advanced Directives  Does Patient Have a Medical Advance Directive? No No No No No Yes No  Does patient want to make changes to medical advance directive?      No - Patient declined   Would patient like information on creating a medical advance directive? No - Patient declined Yes (Inpatient - patient requests chaplain consult to create a medical advance directive) No - Patient declined No - Patient declined No - Patient declined      Current Medications (verified) Outpatient Encounter Medications as of 09/06/2021  Medication Sig   albuterol (VENTOLIN HFA) 108 (90 Base) MCG/ACT inhaler Inhale 2 puffs into the lungs every 6 (six) hours as needed for wheezing or shortness of breath.   AMBULATORY NON FORMULARY MEDICATION GI Cocktail - 90 mls of 2% Lidocaine                      90 mls Dicyclomine '10mg'$ /527m                   270 mls Maalox Take 5-10 ml every 4-6 hours as needed (Patient taking differently: GI Cocktail - 90 mls of 2% Lidocaine                      90 mls Dicyclomine '10mg'$ /71m43m                  270 mls Maalox Take 5-10 ml every 4-6 hours as needed)   amLODipine (NORVASC) 10 MG tablet TAKE 1 TABLET BY MOUTH DAILY.   aspirin EC 81 MG tablet Take 81 mg by mouth daily. Swallow whole.   atorvastatin (LIPITOR) 40 MG tablet TAKE 1 TABLET BY MOUTH AT  BEDTIME.   Azelastine HCl 137 MCG/SPRAY SOLN Place 2 sprays into the nose 2 (two) times daily.   budesonide-formoterol (SYMBICORT) 160-4.5 MCG/ACT inhaler Inhale 2 puffs into the lungs 2 (two) times daily.   [place] Oil-Levomenthol (FDGARD) 25-20.75 MG CAPS Take 2 tablets by mouth in the morning and at bedtime.    diclofenac sodium (VOLTAREN) 1 % GEL Apply 4 g topically 3 (three) times daily as needed.   dicyclomine (BENTYL) 20 MG tablet TAKE 1 TABLET BY MOUTH 3 TIMES DAILY BEFORE MEALS.   doxycycline (VIBRA-TABS) 100 MG tablet Take 1 tablet (100 mg total) by mouth 2 (two) times daily.   escitalopram (LEXAPRO) 10 MG tablet TAKE 1 TABLET (10 MG TOTAL) BY MOUTH DAILY.   famotidine (PEPCID) 20 MG tablet TAKE 1 TABLET BY MOUTH 2 TIMES DAILY BEFORE A MEAL.   Flaxseed, Linseed, (FLAXSEED  OIL) 1000 MG CAPS Take 2,000 mg by mouth daily.    fluticasone (FLONASE) 50 MCG/ACT nasal spray Place 1 spray into both nostrils daily.   furosemide (LASIX) 40 MG tablet Take 1 tablet (40 mg total) by mouth daily.   hydrocortisone 2.5 % cream Apply topically 2 (two) times daily.   JANUVIA 100 MG tablet TAKE 1 TABLET (100 MG TOTAL) BY MOUTH DAILY.   ketoconazole (NIZORAL) 2 % cream Apply 1 application topically daily.   losartan (COZAAR) 50 MG tablet TAKE 1 TABLET (50 MG TOTAL) BY MOUTH DAILY.   Multiple Vitamin (MULTIVITAMIN) tablet Take 1 tablet by mouth daily.   omeprazole (PRILOSEC) 20 MG capsule TAKE 1 CAPSULE (20 MG TOTAL) BY MOUTH DAILY.   pioglitazone (ACTOS) 30 MG tablet Take 1 tablet (30 mg total) by mouth daily.   sildenafil (REVATIO) 20 MG tablet TAKE 3 TO 4 TABLETS BY MOUTH AT BEDTIME AS NEEDED. (Patient taking differently: Take 60-80 mg by mouth at bedtime as needed.)   No facility-administered encounter medications on file as of 09/06/2021.    Allergies (verified) Metformin and related and Sulfa antibiotics   History: Past Medical History:  Diagnosis Date   Anxiety    Arthritis    hands right    Diabetes mellitus without complication (HCC)    GERD (gastroesophageal reflux disease)    Hypertension    Kidney stones    several episodes.  Last one in 2011.     Obesity    100-lb weight loss since 2011.    Polycythemia    Sleep apnea    on Cpap   Past Surgical History:  Procedure Laterality Date   CHOLECYSTECTOMY     HAND SURGERY  2010   R hand , d/t a injury, 3 surgeries    Family History  Problem Relation Age of Onset   Diabetes Mother    Coronary artery disease Mother 80   Stroke Father        M and F   Hypertension Father    Heart disease Sister        ?   Alcohol abuse Sister    Colon cancer Sister 84       age ~ 58   Prostate cancer Neg Hx    Stomach cancer Neg Hx    Rectal cancer Neg Hx    Esophageal cancer Neg Hx    Social History   Socioeconomic History   Marital status: Married    Spouse name: Not on file   Number of children: Not on file   Years of education: Not on file   Highest education level: Not on file  Occupational History   Occupation: RETIREd ---[employer], 2021  Tobacco Use   Smoking status: Never   Smokeless tobacco: Never  Vaping Use   Vaping Use: Never used  Substance and Sexual Activity   Alcohol use: No    Comment: none for 15 years   Drug use: No   Sexual activity: Not on file  Other Topics Concern   Not on file  Social History Narrative   1ST WIFE DIED FROM LUNG CANCER, 2ND WIFE DIED FROM RENAL CANCER.     Married x 4 , lives w/ wife   3 sisters and 3 brother : lost 2 sisters    Social Determinants of Radio broadcast assistant Strain: Low Risk    Difficulty of Paying Living Expenses: Not hard at all  Food Insecurity: No Food Insecurity   Worried About  Running Out of Food in the Last Year: Never true   Ran Out of Food in the Last Year: Never true  Transportation Needs: No Transportation Needs   Lack of Transportation (Medical): No   Lack of Transportation (Non-Medical): No  Physical Activity: Unknown   Days  of Exercise per Week: 0 days   Minutes of Exercise per Session: Not on file  Stress: No Stress Concern Present   Feeling of Stress : Not at all  Social Connections: Moderately Integrated   Frequency of Communication with Friends and Family: More than three times a week   Frequency of Social Gatherings with Friends and Family: More than three times a week   Attends Religious Services: More than 4 times per year   Active Member of [employer] or Organizations: No   Attends Music therapist: Never   Marital Status: Married    Tobacco Counseling Counseling given: Not Answered   Clinical Intake:  Pre-visit preparation completed: Yes  Pain : 0-10 Pain Score: 2  Pain Location: Leg Pain Orientation: Left Pain Descriptors / Indicators: Sore, Aching, Dull Pain Onset: More than a month ago Pain Frequency: Rarely     Nutritional Status: BMI > 30  Obese Nutritional Risks: Unintentional weight gain Diabetes: Yes CBG done?: No Did pt. bring in CBG monitor from home?: No  How often do you need to have someone help you when you read instructions, pamphlets, or other written materials from your doctor or pharmacy?: 1 - Never  Diabetic?Yes Nutrition Risk Assessment:  Has the patient had any N/V/D within the last 2 months?  No  Does the patient have any non-healing wounds?  No  Has the patient had any unintentional weight loss or weight gain?  Yes   Diabetes:  Is the patient diabetic?  Yes  If diabetic, was a CBG obtained today?  No  Did the patient bring in their glucometer from home?  No  How often do you monitor your CBG's? Once daily.   Financial Strains and Diabetes Management:  Are you having any financial strains with the device, your supplies or your medication? No .  Does the patient want to be seen by Chronic Care Management for management of their diabetes?  No  Would the patient like to be referred to a Nutritionist or for Diabetic Management?  No   Diabetic  Exams:  Diabetic Eye Exam: Completed 02/15/21 Diabetic Foot Exam: Completed 05/24/21      Interpreter Needed?: No  Information entered by :: [place] [facility] Daily [facility]    09/06/2021    1:09 PM  In your present state of health, do you have any difficulty performing the following activities:  Hearing? 0  Vision? 0  Difficulty concentrating or making decisions? 0  Walking or climbing stairs? 1  Comment drop foot  Dressing or bathing? 0  Doing errands, shopping? 0  Preparing Food and eating ? N  Using the Toilet? N  In the past six months, have you accidently leaked urine? N  Do you have problems with loss of bowel control? N  Managing your Medications? N  Managing your Finances? N  Housekeeping or managing your Housekeeping? N    Patient Care Team: Colon Branch, MD as PCP - General (Internal Medicine) Jettie Booze, MD as PCP - Cardiology (Cardiology) Heath Lark, MD as Consulting Physician (Hematology and Oncology) Celso Amy, NP as Nurse Practitioner (Nurse Practitioner) Clent Jacks, MD as Referring Physician (Ophthalmology)  Indicate any recent Medical Services you may have received from other than [facility] providers in the past year (date may be approximate).     Assessment:   This is a routine wellness examination for Vollie.  Hearing/Vision screen No results found.  Dietary issues and exercise activities discussed: Current Exercise Habits: The patient does not participate in regular exercise at present, Exercise limited by: None identified   Goals Addressed   None    Depression Screen    09/06/2021    1:05 PM 07/06/2021    1:57 PM 04/06/2021    2:48 PM 12/06/2020    3:49 PM 01/23/2020    1:00 PM 01/10/2019    3:22 PM 10/30/2018    3:25 PM  PHQ 2/9 Scores  PHQ - 2 Score 0 0 0 0 0 0 0    Fall Risk    09/06/2021    1:05 PM 07/06/2021    1:56 PM 04/06/2021    2:48 PM 12/06/2020    3:50 PM 08/06/2020    1:32 PM  [place]  in the past year? 0 0 0 0 0  Number falls in past yr: 0 0 0 0 0  Injury with Fall? 0 0 0 0 0  Risk for fall due to : No Fall Risks      Follow up Falls evaluation completed Falls evaluation completed Falls evaluation completed Falls evaluation completed Falls evaluation completed    [place]:  Any stairs in or around the home? Yes  If so, are there any without handrails? No  Home free of loose throw rugs in walkways, pet beds, electrical cords, etc? Yes  Adequate lighting in your home to reduce risk of falls? Yes   ASSISTIVE DEVICES UTILIZED TO PREVENT FALLS:  Life alert? Yes  Use of a cane, walker or w/c? No  Grab bars in the bathroom? No  Shower chair or bench in shower? Yes  Elevated toilet seat or a handicapped toilet? Yes   TIMED UP AND GO:  Was the test performed? Yes .  Length of time to ambulate 10 feet: 10 sec.   Gait steady and fast without use of assistive device  Cognitive Function:        09/06/2021    1:15 PM  6CIT Screen  What Year? 0 points  What month? 0 points  What time? 0 points  Count back from 20 0 points  Months in reverse 0 points  Repeat phrase 2 points  Total Score 2 points    Immunizations Immunization History  Administered Date(s) Administered   Fluad Quad(high Dose 65+) 01/10/2019, 01/23/2020   Influenza Split 01/17/2011, 01/16/2012   Influenza Whole 02/18/2007   Influenza, High Dose Seasonal PF 01/22/2015, 02/03/2016, 01/29/2017, 01/09/2018   Influenza,inj,Quad PF,6+ Mos 02/05/2013, 01/27/2014, 03/28/2019   Influenza-Unspecified 01/15/2021   PFIZER Comirnaty(Gray Top)Covid-19 Tri-Sucrose Vaccine 09/24/2020   PFIZER(Purple Top)SARS-COV-2 Vaccination 04/12/2019, 06/03/2019, 04/07/2020   Pneumococcal Conjugate-13 01/22/2015   Pneumococcal Polysaccharide-23 01/16/2003, 12/03/2012   Tdap 12/03/2012, 05/22/2016   Zoster Recombinat (Shingrix) 01/29/2018, 05/23/2018   Zoster, Live 02/09/2010    TDAP  status: Up to date  Flu Vaccine status: Up to date  Pneumococcal vaccine status: Up to date  Covid-19 vaccine status: Information provided on how to obtain vaccines.   Qualifies for Shingles Vaccine? No   Zostavax completed No   Shingrix Completed?: Yes  Screening Tests Health Maintenance  Topic Date Due   COVID-19 Vaccine (5 -  Booster for [employer] series) 11/19/2020   INFLUENZA VACCINE  11/15/2021   HEMOGLOBIN A1C  11/21/2021   OPHTHALMOLOGY EXAM  02/15/2022   FOOT EXAM  05/24/2022   TETANUS/TDAP  05/22/2026   COLONOSCOPY (Pts 45-5yr Insurance coverage will need to be confirmed)  09/19/2027   Pneumonia Vaccine 74 Years old  Completed   Hepatitis C Screening  Completed   Zoster Vaccines- Shingrix  Completed   HPV VACCINES  Aged Out    Health Maintenance  Health Maintenance Due  Topic Date Due   COVID-19 Vaccine (5 - Booster for [employer]) 11/19/2020    Colorectal cancer screening: Type of screening: Colonoscopy. Completed 09/18/17. Repeat every 10 years  Lung Cancer Screening: (Low Dose CT Chest recommended if Age 74-80years, 30 pack-year currently smoking OR have quit w/in 15years.) does not qualify.   Lung Cancer Screening Referral: N/A  Additional Screening:  Hepatitis C Screening: does qualify; Completed 06/14/21  Vision Screening: Recommended annual ophthalmology exams for early detection of glaucoma and other disorders of the eye. Is the patient up to date with their annual eye exam?  Yes  Who is the provider or what is the name of the office in which the patient attends annual eye exams? Dr. GKaty FitchIf pt is not established with a provider, would they like to be referred to a provider to establish care? No .   Dental Screening: Recommended annual dental exams for proper oral hygiene  Community Resource Referral / Chronic Care Management: CRR required this visit?  No   CCM required this visit?  No      Plan:     I have personally reviewed and noted  the following in the patient's chart:   Medical and social history Use of alcohol, tobacco or illicit drugs  Current medications and supplements including opioid prescriptions. Patient is not currently taking opioid prescriptions. Functional ability and status Nutritional status Physical activity Advanced directives List of other physicians Hospitalizations, surgeries, and ER visits in previous 12 months Vitals Screenings to include cognitive, depression, and falls Referrals and appointments  In addition, I have reviewed and discussed with patient certain preventive protocols, quality metrics, and best practice recommendations. A written personalized care plan for preventive services as well as general preventive health recommendations were provided to patient.     SDuard BradyChism, [place]  09/06/2021   Nurse Notes: none   I have reviewed and agree with Health Coaches documentation.  JKathlene November MD

## 2021-09-13 DIAGNOSIS — G4733 Obstructive sleep apnea (adult) (pediatric): Secondary | ICD-10-CM | POA: Diagnosis not present

## 2021-09-15 ENCOUNTER — Ambulatory Visit (HOSPITAL_BASED_OUTPATIENT_CLINIC_OR_DEPARTMENT_OTHER)
Admission: RE | Admit: 2021-09-15 | Discharge: 2021-09-15 | Disposition: A | Discharge status: Home / Self Care | Payer: Medicare Other | Source: Ambulatory Visit | Attending: Internal Medicine | Admitting: Internal Medicine

## 2021-09-15 ENCOUNTER — Encounter: Payer: Self-pay | Admitting: Internal Medicine

## 2021-09-15 ENCOUNTER — Ambulatory Visit (INDEPENDENT_AMBULATORY_CARE_PROVIDER_SITE_OTHER): Payer: Medicare Other | Admitting: Internal Medicine

## 2021-09-15 VITALS — BP 138/76 | HR 67 | Temp 98.3°F | Resp 18 | Ht 65.0 in | Wt 256.0 lb

## 2021-09-15 DIAGNOSIS — L03116 Cellulitis of left lower limb: Secondary | ICD-10-CM

## 2021-09-15 DIAGNOSIS — M7989 Other specified soft tissue disorders: Secondary | ICD-10-CM | POA: Insufficient documentation

## 2021-09-15 DIAGNOSIS — I10 Essential (primary) hypertension: Secondary | ICD-10-CM

## 2021-09-15 LAB — BASIC METABOLIC PANEL
BUN: 24 mg/dL — ABNORMAL HIGH (ref 6–23)
CO2: 28 mEq/L (ref 19–32)
Calcium: 10.1 mg/dL (ref 8.4–10.5)
Chloride: 102 mEq/L (ref 96–112)
Creatinine, Ser: 1.44 mg/dL (ref 0.40–1.50)
GFR: 48.11 mL/min — ABNORMAL LOW (ref >60.00)
Glucose, Bld: 157 mg/dL — ABNORMAL HIGH (ref 70–99)
Potassium: 4.5 mEq/L (ref 3.5–5.1)
Sodium: 139 mEq/L (ref 135–145)

## 2021-09-15 MED ORDER — LOSARTAN POTASSIUM 100 MG PO TABS: 100.0000 mg | ORAL_TABLET | Freq: Every day | ORAL | 1 refills | Status: DC

## 2021-09-15 NOTE — Progress Notes (Signed)
Subjective:    Patient ID: Donald Jones, male    DOB: [birth year], 76 y.o.   MRN: [id number]  DOS:  09/15/2021 Type of visit - description: Follow-up  Seen at this office approximately 2 weeks ago  He reported chronic lower extremity edema.  Also had a scratch at the left leg for several days, exam was consistent with an abrasion - redness, Dx cellulitis of the left leg,, was Rx doxycycline.  For edema, was rec to stop HCTZ and start Lasix.    He also reported some shortness of breath, related to working outdoors and relieve with albuterol   Wt Readings from Last 3 Encounters:  09/15/21 256 lb (116.1 kg)  09/06/21 252 lb (114.3 kg)  09/01/21 254 lb 9.6 oz (115.5 kg)      Review of Systems  He took antibiotics, left leg has definitely improved. Edema is slightly better but is still the leg swell up at the end of the day. No fever or chills.  No nausea or diarrhea  Ambulatory BPs remain normal Shortness of breath is much improved.  Past Medical History:  Diagnosis Date   Anxiety    Arthritis    hands right   Diabetes mellitus without complication (HCC)    GERD (gastroesophageal reflux disease)    Hypertension    Kidney stones    several episodes.  Last one in 2011.     Obesity    100-lb weight loss since 2011.    Polycythemia    Sleep apnea    on Cpap    Past Surgical History:  Procedure Laterality Date   CHOLECYSTECTOMY     HAND SURGERY  2010   R hand , d/t a injury, 3 surgeries     Current Outpatient Medications  Medication Instructions   albuterol (VENTOLIN HFA) 108 (90 Base) MCG/ACT inhaler 2 puffs, Inhalation, Every 6 hours PRN   AMBULATORY NON FORMULARY MEDICATION GI Cocktail - 90 mls of 2% Lidocaine<BR>                     90 mls Dicyclomine '10mg'$ /51m<BR>                   270 mls Maalox<BR>Take 5-10 ml every 4-6 hours as needed   amLODipine (NORVASC) 10 MG tablet TAKE 1 TABLET BY MOUTH DAILY.   aspirin EC 81 mg, Oral, Daily, Swallow whole.     atorvastatin (LIPITOR) 40 MG tablet TAKE 1 TABLET BY MOUTH AT BEDTIME.   Azelastine HCl 137 MCG/SPRAY SOLN 2 sprays, Nasal, 2 times daily   budesonide-formoterol (SYMBICORT) 160-4.5 MCG/ACT inhaler 2 puffs, Inhalation, 2 times daily   Caraway Oil-Levomenthol (FDGARD) 25-20.75 MG CAPS 2 tablets, Oral, 2 times daily   diclofenac sodium (VOLTAREN) 4 g, Topical, 3 times daily PRN   dicyclomine (BENTYL) 20 MG tablet TAKE 1 TABLET BY MOUTH 3 TIMES DAILY BEFORE MEALS.   doxycycline (VIBRA-TABS) 100 mg, Oral, 2 times daily   escitalopram (LEXAPRO) 10 mg, Oral, Daily   famotidine (PEPCID) 20 MG tablet TAKE 1 TABLET BY MOUTH 2 TIMES DAILY BEFORE A MEAL.   Flaxseed Oil 2,000 mg, Oral, Daily   fluticasone (FLONASE) 50 MCG/ACT nasal spray 1 spray, Each Nare, Daily   furosemide (LASIX) 40 mg, Oral, Daily   hydrocortisone 2.5 % cream Topical, 2 times daily   JANUVIA 100 MG tablet TAKE 1 TABLET (100 MG TOTAL) BY MOUTH DAILY.   ketoconazole (NIZORAL) 2 % cream 1 application., Topical, Daily  losartan (COZAAR) 50 mg, Oral, Daily   Multiple Vitamin (MULTIVITAMIN) tablet 1 tablet, Oral, Daily   omeprazole (PRILOSEC) 20 MG capsule TAKE 1 CAPSULE (20 MG TOTAL) BY MOUTH DAILY.   pioglitazone (ACTOS) 30 mg, Oral, Daily   sildenafil (REVATIO) 20 MG tablet TAKE 3 TO 4 TABLETS BY MOUTH AT BEDTIME AS NEEDED.       Objective:   Physical Exam BP 138/76   Pulse 67   Temp 98.3 F (36.8 C) (Oral)   Resp 18   Ht '5\' 5"'$  (1.651 m)   Wt 256 lb (116.1 kg)   SpO2 97%   BMI 42.60 kg/m  General:   Well developed, NAD, BMI noted. HEENT:  Normocephalic . Face symmetric, atraumatic Lungs:  CTA B Normal respiratory effort, no intercostal retractions, no accessory muscle use. Heart: RRR,  no murmur.  Lower extremities: L pretibial area with dry scabs, no redness, no warmness, no openings or discharge. Does have pitting edema bilaterally, slightly worse on the left, L calf is larger by half inch in  circumference. Slightly tender left calf upon squeezing. Skin: Not pale. Not jaundice Neurologic:  alert & oriented X3.  Speech normal, gait appropriate for age and unassisted Psych--  Cognition and judgment appear intact.  Cooperative with normal attention span and concentration.  Behavior appropriate. No anxious or depressed appearing.      Assessment     ASSESSMENT DM (pre-DM until 2016).  Intolerant to Metformin, see office visit 09/09/2019 HTN Polycythemia- sees hematology, likely d/t OSA-diuretics, JAK2 (-) ~ 2014, last OV 10-2014, f/u prn OSA on CPAP DJD- saw Dr Rhona Raider before  R Foot drop  GI: -GERD - IBS? GI sx on and off, previously dx w/ IBS -Cscopes: 9798,9211, 09/18/2017 - EGD 04/2018, showed gastritis, no H. pylori, no malignancy Urolithiasis, several episodes Negative Myoview 2019, CT coronary angiography 08/2019: Minimal disease  PLAN: Cellulitis, left leg: Improved. I do not think he needs more antibiotics however because of persistent worse swelling on the left we will do a ultrasound to rule out DVT HTN: Previously on amlodipine, HCTZ and losartan, a couple of weeks ago HCTZ was switch to furosemide due to lower extremity edema.  Marginal improvement. Plan: Stop amlodipine, increase losartan from 50 mg to 100 mg daily. Cont lasix,  BMP today and in 2 weeks.  Low-salt diet, leg elevation. Lower extremity edema: See above RTC 2 weeks labs Follow-up July 21 as scheduled

## 2021-09-15 NOTE — Patient Instructions (Addendum)
Once better please proceed with covid booster (bivalent) at your pharmacy.  Stop amlodipine  Increase losartan from 50 mg to 100 mg every day  Continue furosemide  Watch her salt intake and keep your legs elevated when possibleCheck the  blood pressure regularly BP GOAL is between 110/65 and  135/85. If it is consistently higher or lower, let me know    GO TO THE LAB : Get the blood work     [place], [place] back for blood work again in 2 weeks  See you July 21 as scheduled.

## 2021-09-15 NOTE — Assessment & Plan Note (Signed)
Cellulitis, left leg: Improved. I do not think he needs more antibiotics however because of persistent worse swelling on the left we will do a ultrasound to rule out DVT HTN: Previously on amlodipine, HCTZ and losartan, a couple of weeks ago HCTZ was switch to furosemide due to lower extremity edema.  Marginal improvement. Plan: Stop amlodipine, increase losartan from 50 mg to 100 mg daily. Cont lasix,  BMP today and in 2 weeks.  Low-salt diet, leg elevation. Lower extremity edema: See above RTC 2 weeks labs Follow-up July 21 as scheduled

## 2021-09-29 ENCOUNTER — Other Ambulatory Visit (INDEPENDENT_AMBULATORY_CARE_PROVIDER_SITE_OTHER): Payer: Medicare Other

## 2021-09-29 DIAGNOSIS — I10 Essential (primary) hypertension: Secondary | ICD-10-CM | POA: Diagnosis not present

## 2021-09-29 LAB — BASIC METABOLIC PANEL
BUN: 21 mg/dL (ref 6–23)
CO2: 29 mEq/L (ref 19–32)
Calcium: 9.6 mg/dL (ref 8.4–10.5)
Chloride: 104 mEq/L (ref 96–112)
Creatinine, Ser: 1.4 mg/dL (ref 0.40–1.50)
GFR: 49.75 mL/min — ABNORMAL LOW (ref >60.00)
Glucose, Bld: 163 mg/dL — ABNORMAL HIGH (ref 70–99)
Potassium: 4.4 mEq/L (ref 3.5–5.1)
Sodium: 140 mEq/L (ref 135–145)

## 2021-10-03 ENCOUNTER — Other Ambulatory Visit: Payer: Self-pay | Admitting: Internal Medicine

## 2021-10-10 ENCOUNTER — Other Ambulatory Visit: Payer: Self-pay | Admitting: Internal Medicine

## 2021-11-04 ENCOUNTER — Ambulatory Visit (INDEPENDENT_AMBULATORY_CARE_PROVIDER_SITE_OTHER): Payer: Medicare Other | Admitting: Internal Medicine

## 2021-11-04 ENCOUNTER — Encounter: Payer: Self-pay | Admitting: Internal Medicine

## 2021-11-04 VITALS — BP 132/84 | HR 49 | Temp 98.4°F | Resp 18 | Ht 65.0 in | Wt 250.0 lb

## 2021-11-04 DIAGNOSIS — L03116 Cellulitis of left lower limb: Secondary | ICD-10-CM

## 2021-11-04 DIAGNOSIS — I872 Venous insufficiency (chronic) (peripheral): Secondary | ICD-10-CM

## 2021-11-04 DIAGNOSIS — I10 Essential (primary) hypertension: Secondary | ICD-10-CM

## 2021-11-04 NOTE — Patient Instructions (Addendum)
Recommend to proceed with covid booster (bivalent) at your pharmacy. Flu shot this fall.   Take care of your skin: Use Aveeno twice daily as a moisturizer If you see mild irritation, apply over-the-counter hydrocortisone 1% cream once daily Avoid any injuries  If you notice your skin is warm, more red or very tender: Seek medical attention.   Check the  blood pressure regularly BP GOAL is between 110/65 and  135/85. If it is consistently higher or lower, let me know      GO TO THE FRONT DESK, PLEASE SCHEDULE YOUR APPOINTMENTS Come back for a physical exam in 3 months

## 2021-11-04 NOTE — Progress Notes (Unsigned)
Subjective:    Patient ID: Donald Jones, male    DOB: 1945/10/09, 76 y.o.   MRN: [id number]  DOS:  11/04/2021 Type of visit - description: Routine visit  Follow-up from last visit. At the last time, BP meds were adjusted due to edema. Ambulatory BPs are great, edema has gradually decreased. He has some wounds on the legs but that happened yesterday when he hit the pretibial area with furniture that he was moving  Review of Systems See above   Past Medical History:  Diagnosis Date   Anxiety    Arthritis    hands right   Diabetes mellitus without complication ([place])    GERD (gastroesophageal reflux disease)    Hypertension    Kidney stones    several episodes.  Last one in 2011.     Obesity    100-lb weight loss since 2011.    Polycythemia    Sleep apnea    on Cpap    Past Surgical History:  Procedure Laterality Date   CHOLECYSTECTOMY     HAND SURGERY  2010   R hand , d/t a injury, 3 surgeries     Current Outpatient Medications  Medication Instructions   albuterol (VENTOLIN HFA) 108 (90 Base) MCG/ACT inhaler 2 puffs, Inhalation, Every 6 hours PRN   AMBULATORY NON FORMULARY MEDICATION GI Cocktail - 90 mls of 2% Lidocaine<BR>                     90 mls Dicyclomine '10mg'$ /34m<BR>                   270 mls Maalox<BR>Take 5-10 ml every 4-6 hours as needed   aspirin EC 81 mg, Oral, Daily, Swallow whole.    atorvastatin (LIPITOR) 40 MG tablet TAKE 1 TABLET BY MOUTH AT BEDTIME.   Azelastine HCl 137 MCG/SPRAY SOLN 2 sprays, Nasal, 2 times daily   budesonide-formoterol (SYMBICORT) 160-4.5 MCG/ACT inhaler 2 puffs, Inhalation, 2 times daily   Caraway Oil-Levomenthol (FDGARD) 25-20.75 MG CAPS 2 tablets, Oral, 2 times daily   diclofenac sodium (VOLTAREN) 4 g, Topical, 3 times daily PRN   dicyclomine (BENTYL) 20 MG tablet TAKE 1 TABLET BY MOUTH 3 TIMES DAILY BEFORE MEALS.   doxycycline (VIBRA-TABS) 100 mg, Oral, 2 times daily   escitalopram (LEXAPRO) 10 mg, Oral, Daily    famotidine (PEPCID) 20 MG tablet TAKE 1 TABLET BY MOUTH 2 TIMES DAILY BEFORE A MEAL.   Flaxseed Oil 2,000 mg, Oral, Daily   fluticasone (FLONASE) 50 MCG/ACT nasal spray 1 spray, Each Nare, Daily   furosemide (LASIX) 40 mg, Oral, Daily   hydrocortisone 2.5 % cream Topical, 2 times daily   ketoconazole (NIZORAL) 2 % cream 1 application , Topical, Daily   losartan (COZAAR) 100 mg, Oral, Daily   Multiple Vitamin (MULTIVITAMIN) tablet 1 tablet, Oral, Daily   omeprazole (PRILOSEC) 20 MG capsule TAKE 1 CAPSULE (20 MG TOTAL) BY MOUTH DAILY.   pioglitazone (ACTOS) 30 mg, Oral, Daily   sildenafil (REVATIO) 20 MG tablet TAKE 3 TO 4 TABLETS BY MOUTH AT BEDTIME AS NEEDED.   sitaGLIPtin (JANUVIA) 100 MG tablet TAKE 1 TABLET (100 MG TOTAL) BY MOUTH DAILY.       Objective:   Physical Exam BP 132/84   Pulse (!) 49   Temp 98.4 F (36.9 C) (Oral)   Resp 18   Ht '5\' 5"'$  (1.651 m)   Wt 250 lb (113.4 kg)   SpO2 97%   BMI 41.60  kg/m  General:   Well developed, NAD, BMI noted. HEENT:  Normocephalic . Face symmetric, atraumatic Lungs:  CTA B Normal respiratory effort, no intercostal retractions, no accessory muscle use. Heart: RRR,  no murmur.  Lower extremities: no pretibial edema bilaterally  See picture, pitting edema pretibially slightly more noticeable on the left. He has some superficial wounds without discharge. On the right pretibial area the skin is violaceous but not warm or tender to touch. Neurologic:  alert & oriented X3.  Speech normal, gait c/w  R foot drop Psych--  Cognition and judgment appear intact.  Cooperative with normal attention span and concentration.  Behavior appropriate. No anxious or depressed appearing.       Assessment     ASSESSMENT DM (pre-DM until 2016).  Intolerant to Metformin, see office visit 09/09/2019 HTN Polycythemia- sees hematology, likely d/t OSA-diuretics, JAK2 (-) ~ 2014, last OV 10-2014, f/u prn OSA on CPAP DJD- saw Dr Rhona Raider before  R  Foot drop  GI: -GERD - IBS? GI sx on and off, previously dx w/ IBS -Cscopes: 7893,8101, 09/18/2017 - EGD 04/2018, showed gastritis, no H. pylori, no malignancy Urolithiasis, several episodes Negative Myoview 2019, CT coronary angiography 08/2019: Minimal disease  PLAN: HTN: At the last visit, amlodipine was stopped due to edema and losartan increased to 100 mg.  BMP was okay.  He also takes Lasix.  Ambulatory BPs reportedly great, normal.  Continue the same treatment  DM: Well-controlled On pioglitazone, Januvia. Cellulitis, L leg: See last visit, ultrasound was done, negative for DVT.  She is symptoms improving.  Seen legs Chronic dermatitis, has some pretibial skin changes particularly on the right side, suspect has chronic stasis.  Recommend to use moisturizers and occasional hydrocortisone OTC.  Most important avoid any injuries. Asthma: Has noticed some cough and wheezing when he goes outdoors.  Air quality has been poor, temperature is very high.  Recommend to stay indoors as much as possible.  If symptoms persist or increase he will let me know. RTC 3 months CPX   =====6/1/ Cellulitis, left leg: Improved. I do not think he needs more antibiotics however because of persistent worse swelling on the left we will do a ultrasound to rule out DVT HTN: Previously on amlodipine, HCTZ and losartan, a couple of weeks ago HCTZ was switch to furosemide due to lower extremity edema.  Marginal improvement. Plan: Stop amlodipine, increase losartan from 50 mg to 100 mg daily. Cont lasix,  BMP today and in 2 weeks.  Low-salt diet, leg elevation. Lower extremity edema: See above RTC 2 weeks labs Follow-up July 21 as scheduled

## 2021-11-06 DIAGNOSIS — I872 Venous insufficiency (chronic) (peripheral): Secondary | ICD-10-CM | POA: Insufficient documentation

## 2021-11-06 NOTE — Assessment & Plan Note (Signed)
HTN: At the last visit, amlodipine was stopped due to edema and losartan increased to 100 mg.  BMP was okay.  He also takes Lasix.  Ambulatory BPs reportedly great, normal.  Continue the same treatment DM: Well-controlled On pioglitazone, Januvia. Cellulitis, L leg: See LOV; problem resoved; a [place] was done d/t swelling: nor DVT.  Chronic dermatitis, has some pretibial skin changes particularly on the right side, suspect has chronic stasis.  Recommend to use moisturizers and occasional hydrocortisone OTC.  Most important avoid any injuries. Asthma: Has noticed some cough and wheezing when he goes outdoors.  Air quality has been poor, temperatures very high.  Recommend to stay indoors as much as possible.  If symptoms persist or increase he will let me know. RTC 3 months CPX

## 2021-11-16 IMAGING — CR DG CHEST 2V
2 series · 2 of 2 positions shown · non-contrast
Comparison: 09/17/2012

CLINICAL DATA: Chest pressure and central burning sensation

EXAM:
CHEST - 2 VIEW

[chest pa]
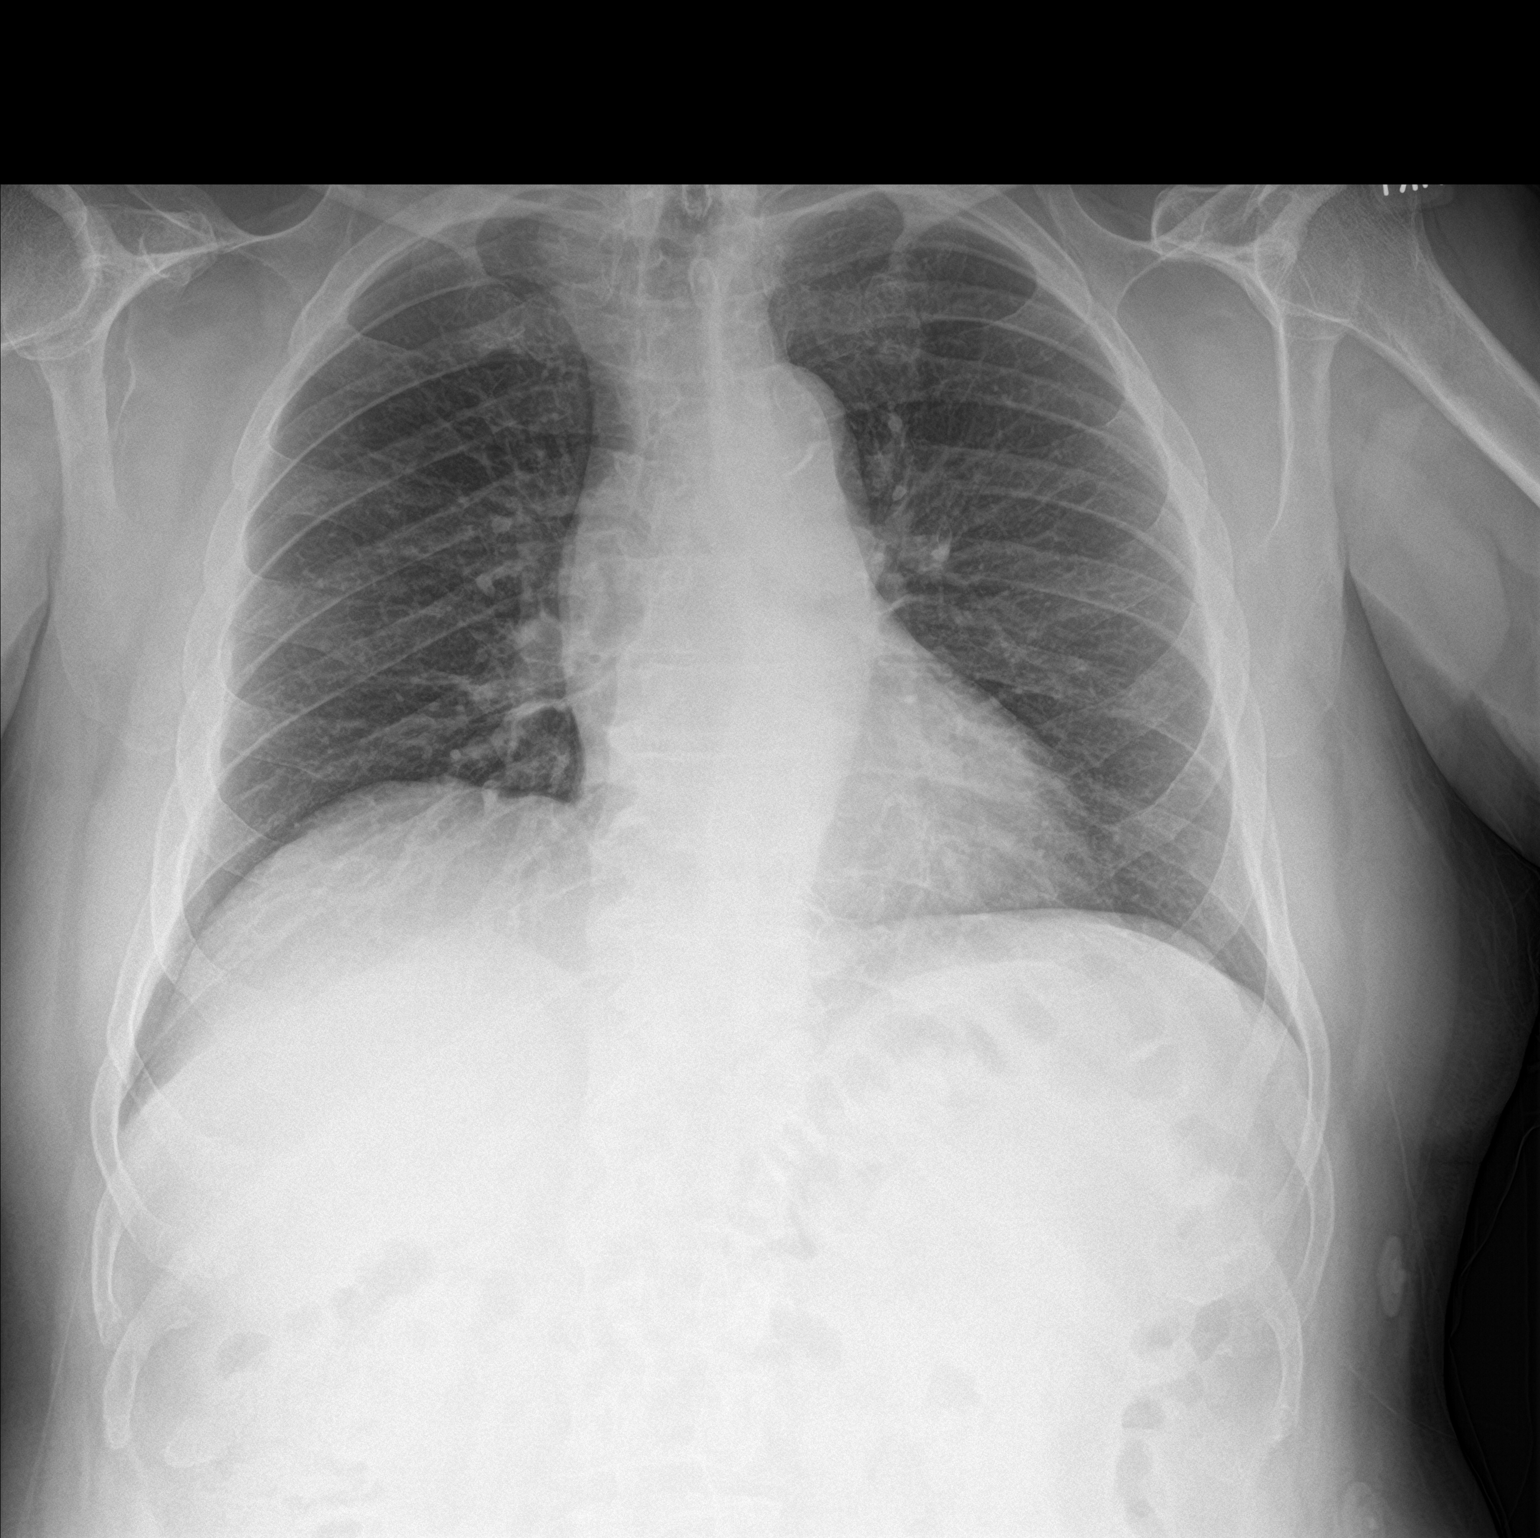

[chest lat]
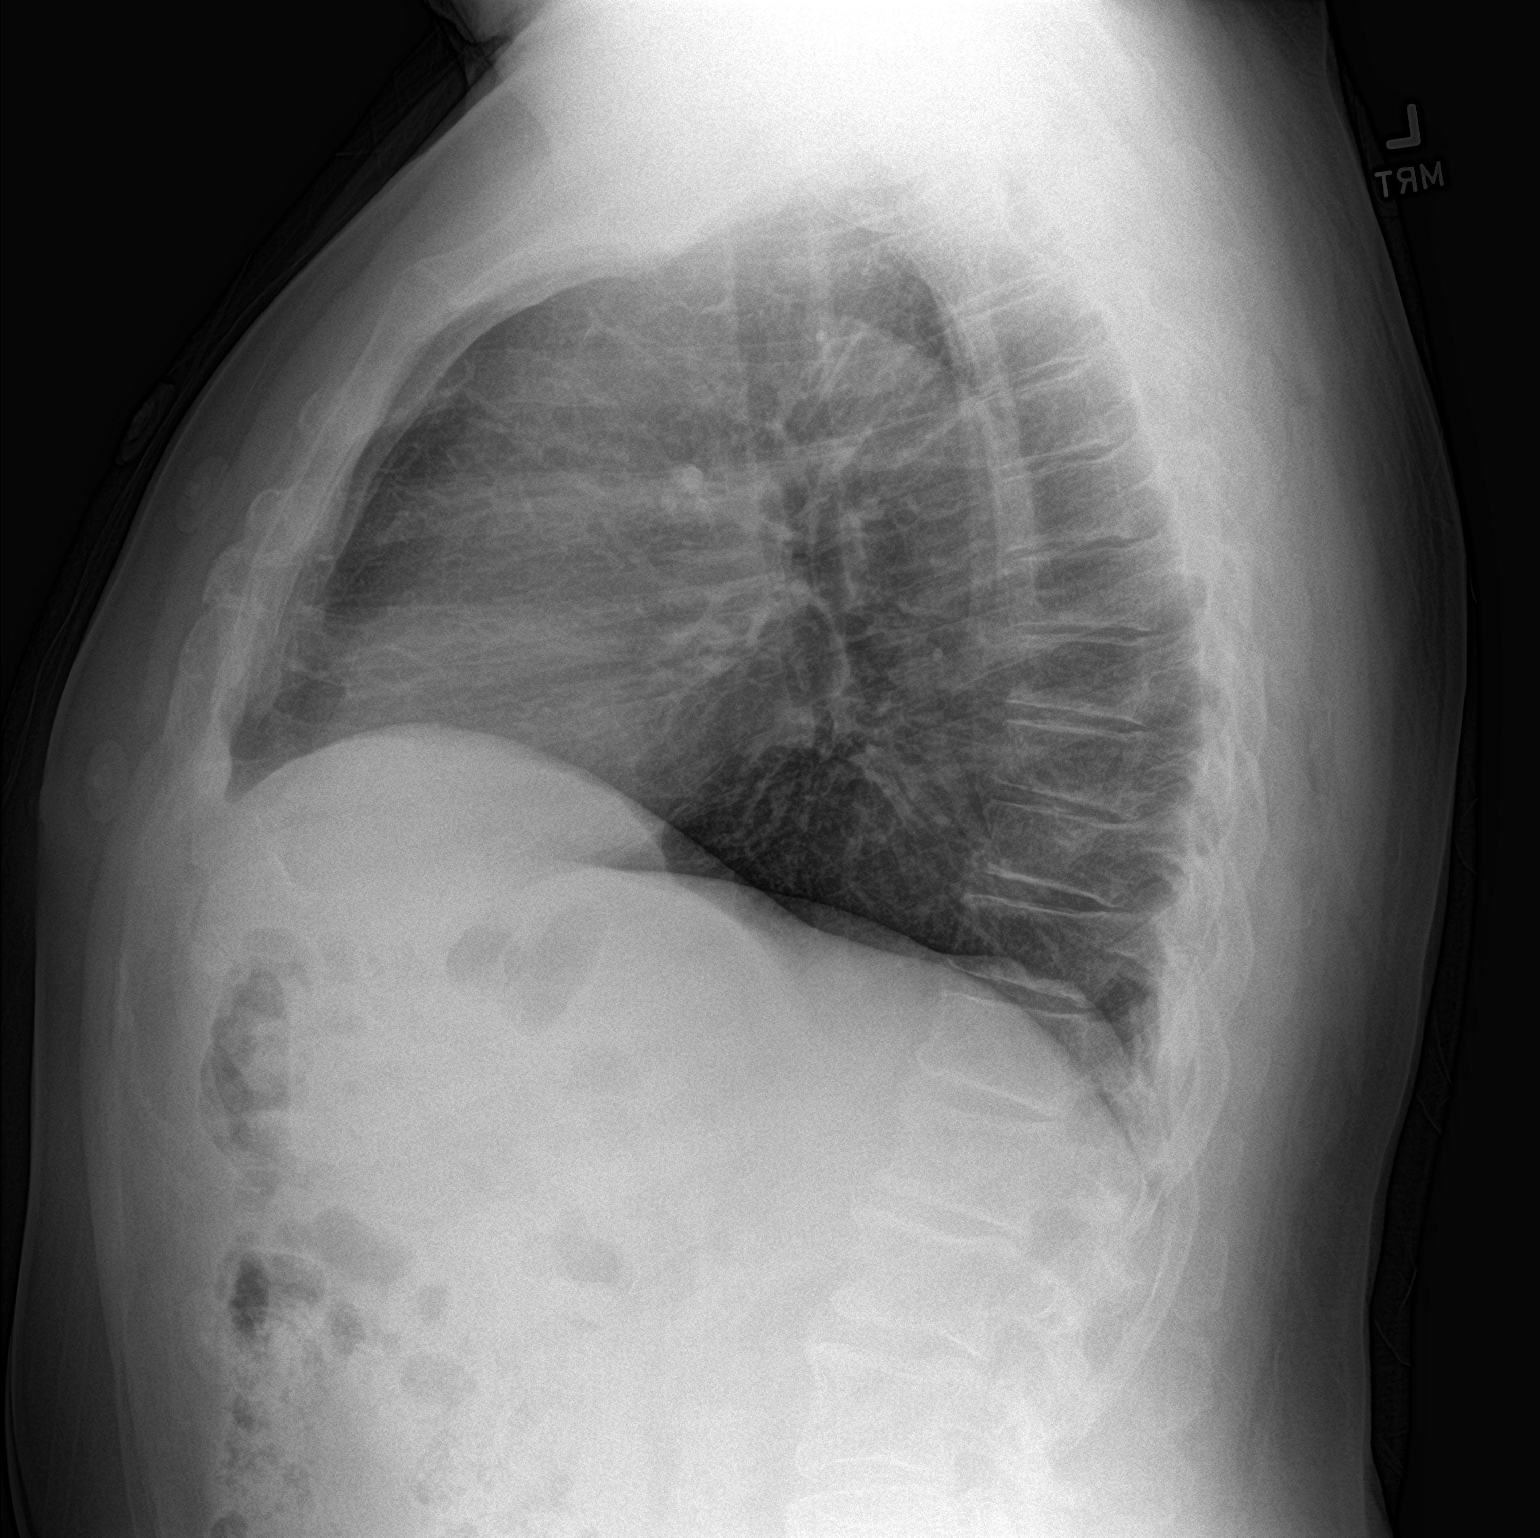

[2 of 2 positions shown; findings below may reference images not displayed]

FINDINGS: The heart size and mediastinal contours are within normal limits.
Both lungs are clear. The visualized skeletal structures are
unremarkable.
IMPRESSION: No active cardiopulmonary disease.

## 2021-11-23 DIAGNOSIS — L97909 Non-pressure chronic ulcer of unspecified part of unspecified lower leg with unspecified severity: Secondary | ICD-10-CM | POA: Diagnosis not present

## 2021-11-23 DIAGNOSIS — L989 Disorder of the skin and subcutaneous tissue, unspecified: Secondary | ICD-10-CM | POA: Diagnosis not present

## 2021-11-23 DIAGNOSIS — E119 Type 2 diabetes mellitus without complications: Secondary | ICD-10-CM | POA: Diagnosis not present

## 2021-11-23 DIAGNOSIS — B351 Tinea unguium: Secondary | ICD-10-CM | POA: Diagnosis not present

## 2021-12-05 ENCOUNTER — Other Ambulatory Visit: Payer: Self-pay | Admitting: Internal Medicine

## 2021-12-12 NOTE — Progress Notes (Signed)
Cardiology Office Note   Date:  12/14/2021   ID:  Donald Jones, Donald Jones Mar 12, 1946, MRN [id number]  PCP:  Colon Branch, MD    No chief complaint on file.    Wt Readings from Last 3 Encounters:  12/14/21 252 lb (114.3 kg)  11/04/21 250 lb (113.4 kg)  09/15/21 256 lb (116.1 kg)       History of Present Illness: Donald Jones is a 76 y.o. male  Who I saw for chest pain in 12/19.   He has had chronic GI issues and endoscopy was planned.     Stress test in 12/19 showed: "The left ventricular ejection fraction is normal (55-65%). Nuclear stress EF: 60%. Blood pressure demonstrated a normal response to exercise. The study is normal. This is a low risk study.   Normal resting and stress perfusion. No ischemia or infarction EF 60% Baseline ECG with ST changes and artifact during stress so nondiagnostic Patient achieved only 87% PMHR Normal hemodynamic response"   Hospitalized in 4/21 with atypical chest pain: "He had a burning substernal discomfort.  It mostly occurs after meals or with anxiety.  He has taken a GI cocktail as an outpatient in the past.  No history of exertional chest pain.   His cardiac enzymes remained negative and his ECG is nonacute.   A cardiac CT was felt the most appropriate test for him, but it could not be obtained because of poor IV access and infiltrated IVs.   Dr. Margaretann Loveless reviewed the situation with the family and the decision was made to treat GI symptoms and have a early follow-up in the office with cardiology and with GI.   Therefore, no further inpatient work-up is indicated and he is considered stable for discharge, to follow up as an outpatient."    2021 echo showed normal LV function and valvular function.   Noted in 2021: "He continues to have occasional tightness in his chest.  It feels like gas pain to him.  No pain in the last three days.  He is a Contractor.  Feels well at work.  GI cocktail seems to help.  Xanax helps as  well. "   2021 coronary CTA showed: "Coronary calcium score of 59. This was 33rd percentile for age and sex matched control.   2. Calcified plaque in the ostial left main causes minimal (0-24%) stenosis   3. 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 Minimal non-obstructive CAD (0-24%). Consider non-atherosclerotic causes of chest pain. Consider preventive therapy and risk factor modification."  Left knee pain in 2023.  Getting injections.  Wife with Parkinson's disease.  Denies : Chest pain. Dizziness. Leg edema. Nitroglycerin use. Orthopnea. Palpitations. Paroxysmal nocturnal dyspnea.  Syncope.      Past Medical History:  Diagnosis Date   Anxiety    Arthritis    hands right   Diabetes mellitus without complication (HCC)    GERD (gastroesophageal reflux disease)    Hypertension    Kidney stones    several episodes.  Last one in 2011.     Obesity    100-lb weight loss since 2011.    Polycythemia    Sleep apnea    on Cpap    Past Surgical History:  Procedure Laterality Date   CHOLECYSTECTOMY     HAND SURGERY  2010   R hand , d/t a injury, 3 surgeries      Current Outpatient Medications  Medication Sig Dispense  Refill   albuterol (VENTOLIN HFA) 108 (90 Base) MCG/ACT inhaler Inhale 2 puffs into the lungs every 6 (six) hours as needed for wheezing or shortness of breath. 18 g 5   AMBULATORY NON FORMULARY MEDICATION GI Cocktail - 90 mls of 2% Lidocaine                      90 mls Dicyclomine '10mg'$ /56m                    270 mls Maalox Take 5-10 ml every 4-6 hours as needed 120 mL 0   aspirin EC 81 MG tablet Take 81 mg by mouth daily. Swallow whole.     atorvastatin (LIPITOR) 40 MG tablet TAKE 1 TABLET BY MOUTH AT BEDTIME. 90 tablet 1   Azelastine HCl 137 MCG/SPRAY SOLN Place 2 sprays into the nose 2 (two) times daily. 30 mL 6   budesonide-formoterol (SYMBICORT) 160-4.5 MCG/ACT inhaler Inhale 2 puffs into the lungs 2 (two) times daily. 1 each 3    Caraway Oil-Levomenthol (FDGARD) 25-20.75 MG CAPS Take 2 tablets by mouth in the morning and at bedtime.      diclofenac sodium (VOLTAREN) 1 % GEL Apply 4 g topically 3 (three) times daily as needed. 100 g 3   dicyclomine (BENTYL) 20 MG tablet TAKE 1 TABLET BY MOUTH 3 TIMES DAILY BEFORE MEALS. 270 tablet 1   escitalopram (LEXAPRO) 10 MG tablet TAKE 1 TABLET (10 MG TOTAL) BY MOUTH DAILY. 90 tablet 1   famotidine (PEPCID) 20 MG tablet TAKE 1 TABLET BY MOUTH 2 TIMES DAILY BEFORE A MEAL. 180 tablet 1   Flaxseed, Linseed, (FLAXSEED OIL) 1000 MG CAPS Take 2,000 mg by mouth daily.      fluticasone (FLONASE) 50 MCG/ACT nasal spray Place 1 spray into both nostrils daily. 16 g 12   furosemide (LASIX) 40 MG tablet Take 1 tablet (40 mg total) by mouth daily. 30 tablet 3   hydrocortisone 2.5 % cream Apply topically 2 (two) times daily. 60 g 1   ketoconazole (NIZORAL) 2 % cream Apply 1 application topically daily. 30 g 0   losartan (COZAAR) 100 MG tablet Take 1 tablet (100 mg total) by mouth daily. 90 tablet 1   Multiple Vitamin (MULTIVITAMIN) tablet Take 1 tablet by mouth daily.     omeprazole (PRILOSEC) 20 MG capsule TAKE 1 CAPSULE (20 MG TOTAL) BY MOUTH DAILY. 90 capsule 1   pioglitazone (ACTOS) 30 MG tablet Take 1 tablet (30 mg total) by mouth daily. 90 tablet 1   sildenafil (REVATIO) 20 MG tablet TAKE 3 TO 4 TABLETS BY MOUTH AT BEDTIME AS NEEDED. 30 tablet 3   sitaGLIPtin (JANUVIA) 100 MG tablet TAKE 1 TABLET (100 MG TOTAL) BY MOUTH DAILY. 90 tablet 1   No current facility-administered medications for this visit.    Allergies:   Metformin and related and Sulfa antibiotics    Social History:  The patient  reports that he has never smoked. He has never used smokeless tobacco. He reports that he does not drink alcohol and does not use drugs.   Family History:  The patient's family history includes Alcohol abuse in his sister; Colon cancer (age of onset: 533 in his sister; Coronary artery disease (age of  onset: 656 in his mother; Diabetes in his mother; Heart disease in his sister; Hypertension in his father; Stroke in his father.    ROS:  Please see the history of present illness.   Otherwise,  review of systems are positive for knee pain.   All other systems are reviewed and negative.    PHYSICAL EXAM: VS:  BP 120/80 (BP Location: Left Arm, Patient Position: Sitting, Cuff Size: Normal)   Pulse (!) 58   Ht '5\' 5"'$  (1.651 m)   Wt 252 lb (114.3 kg)   BMI 41.93 kg/m  , BMI Body mass index is 41.93 kg/m. GEN: Well nourished, well developed, in no acute distress HEENT: normal Neck: no JVD, carotid bruits, or masses Cardiac: RRR; no murmurs, rubs, or gallops,; mild pretibial edema  Respiratory:  clear to auscultation bilaterally, normal work of breathing GI: soft, nontender, nondistended, + BS, obese MS: no deformity or atrophy Skin: warm and dry, legs discolored Neuro:  Strength and sensation are intact Psych: euthymic mood, full affect   EKG:   The ekg ordered today demonstrates NSR, no ST changes   Recent Labs: 07/06/2021: ALT 37; Hemoglobin 15.4; Platelets 238.0 09/29/2021: BUN 21; Creatinine, Ser 1.40; Potassium 4.4; Sodium 140   Lipid Panel    Component Value Date/Time   CHOL 108 07/06/2021 1445   TRIG 38.0 07/06/2021 1445   HDL 52.00 07/06/2021 1445   CHOLHDL 2 07/06/2021 1445   VLDL 7.6 07/06/2021 1445   LDLCALC 49 07/06/2021 1445   LDLCALC 49 08/06/2020 1406     Other studies Reviewed: Additional studies/ records that were reviewed today with results demonstrating: labs reviewed.  ASSESSMENT AND PLAN:  Coronary artery calcification: No angina. Continue aggressive secondary prevention.  Increase activity to help improve stamina. DM2: A1c 6.5 in February 2023. Hyperlipidemia: In March 2023 total cholesterol 108 HDL 52 LDL 49 triglycerides 38.  Continue atorvastatin along with a whole food, plant-based diet.  High-fiber diet.  Avoid processed foods. Obesity: Weight  loss would be beneficial for the heart and then for the knee. HTN: The current medical regimen is effective;  continue present plan and medications.  Home readings well controlled.    Current medicines are reviewed at length with the patient today.  The patient concerns regarding his medicines were addressed.  The following changes have been made:  No change  Labs/ tests ordered today include:  No orders of the defined types were placed in this encounter.   Recommend 150 minutes/week of aerobic exercise Low fat, low carb, high fiber diet recommended  Disposition:   FU in 1 year   Signed, Larae Grooms, MD  12/14/2021 [facility] [address], [place], [place]  [address] Phone: [phone]; Fax: [phone]

## 2021-12-14 ENCOUNTER — Encounter: Payer: Self-pay | Admitting: Interventional Cardiology

## 2021-12-14 ENCOUNTER — Ambulatory Visit: Payer: Medicare Other | Attending: Interventional Cardiology | Admitting: Interventional Cardiology

## 2021-12-14 VITALS — BP 120/80 | HR 58 | Ht 65.0 in | Wt 252.0 lb

## 2021-12-14 DIAGNOSIS — E119 Type 2 diabetes mellitus without complications: Secondary | ICD-10-CM

## 2021-12-14 DIAGNOSIS — E782 Mixed hyperlipidemia: Secondary | ICD-10-CM

## 2021-12-14 DIAGNOSIS — I2510 Atherosclerotic heart disease of native coronary artery without angina pectoris: Secondary | ICD-10-CM

## 2021-12-14 DIAGNOSIS — I10 Essential (primary) hypertension: Secondary | ICD-10-CM

## 2021-12-14 DIAGNOSIS — I2584 Coronary atherosclerosis due to calcified coronary lesion: Secondary | ICD-10-CM

## 2021-12-14 NOTE — Patient Instructions (Signed)
Medication Instructions:  Your physician recommends that you continue on your current medications as directed. Please refer to the Current Medication list given to you today.  *If you need a refill on your cardiac medications before your next appointment, please call your pharmacy*   Lab Work: none If you have labs (blood work) drawn today and your tests are completely normal, you will receive your results only by: MyChart Message (if you have MyChart) OR A paper copy in the mail If you have any lab test that is abnormal or we need to change your treatment, we will call you to review the results.   Testing/Procedures: none   Follow-Up: At [facility], you and your health needs are our priority.  As part of our continuing mission to provide you with exceptional heart care, we have created designated Provider Care Teams.  These Care Teams include your primary Cardiologist (physician) and Advanced Practice Providers (APPs -  Physician Assistants and Nurse Practitioners) who all work together to provide you with the care you need, when you need it.  We recommend signing up for the patient portal called "MyChart".  Sign up information is provided on this After Visit Summary.  MyChart is used to connect with patients for Virtual Visits (Telemedicine).  Patients are able to view lab/test results, encounter notes, upcoming appointments, etc.  Non-urgent messages can be sent to your provider as well.   To learn more about what you can do with MyChart, go to [email or web address].    Your next appointment:   12 month(s)  The format for your next appointment:   In Person  Provider:   Jayadeep Varanasi, MD     Other Instructions    Important Information About Sugar       

## 2021-12-20 ENCOUNTER — Other Ambulatory Visit: Payer: Self-pay | Admitting: Family Medicine

## 2021-12-20 ENCOUNTER — Other Ambulatory Visit: Payer: Self-pay | Admitting: Internal Medicine

## 2021-12-20 DIAGNOSIS — R600 Localized edema: Secondary | ICD-10-CM

## 2021-12-21 DIAGNOSIS — G4733 Obstructive sleep apnea (adult) (pediatric): Secondary | ICD-10-CM | POA: Diagnosis not present

## 2022-01-02 ENCOUNTER — Other Ambulatory Visit: Payer: Self-pay | Admitting: Internal Medicine

## 2022-01-03 ENCOUNTER — Telehealth: Payer: Self-pay | Admitting: Internal Medicine

## 2022-01-03 NOTE — Telephone Encounter (Signed)
Patient called to see if he can get the RSV vaccine. Pharmacist advised him to check with his provider about whether it's okay to get it or not. Please call to advise

## 2022-01-03 NOTE — Telephone Encounter (Signed)
Spoke w/ Pt's wife Diane- informed PCP does recommend RSV vaccine.

## 2022-01-13 IMAGING — DX DG CHEST 2V
2 series · 2 of 2 positions shown · non-contrast
Comparison: 07/04/2019 chest radiograph.

CLINICAL DATA: Chest pain, dyspnea

EXAM:
CHEST - 2 VIEW

[chest pa]
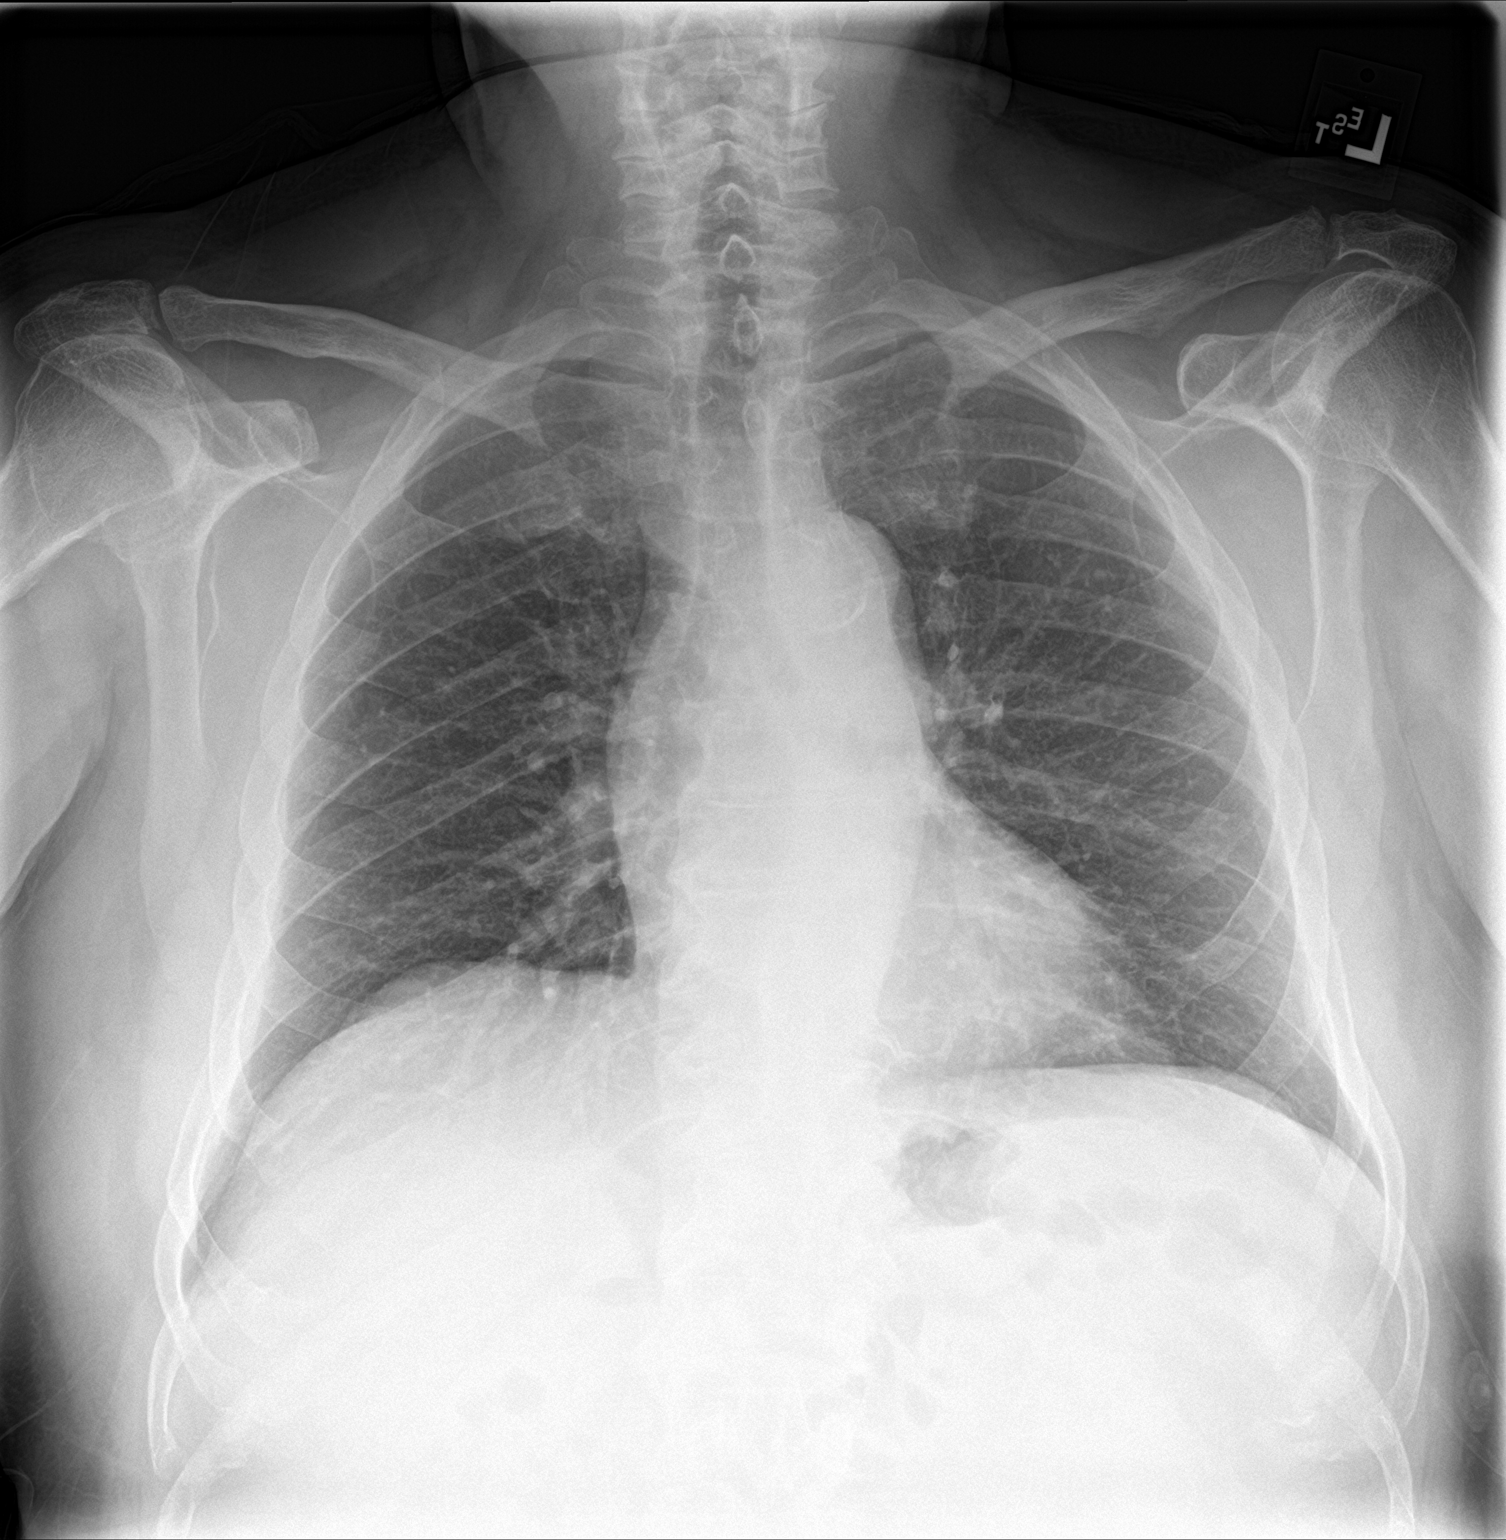

[chest lat]
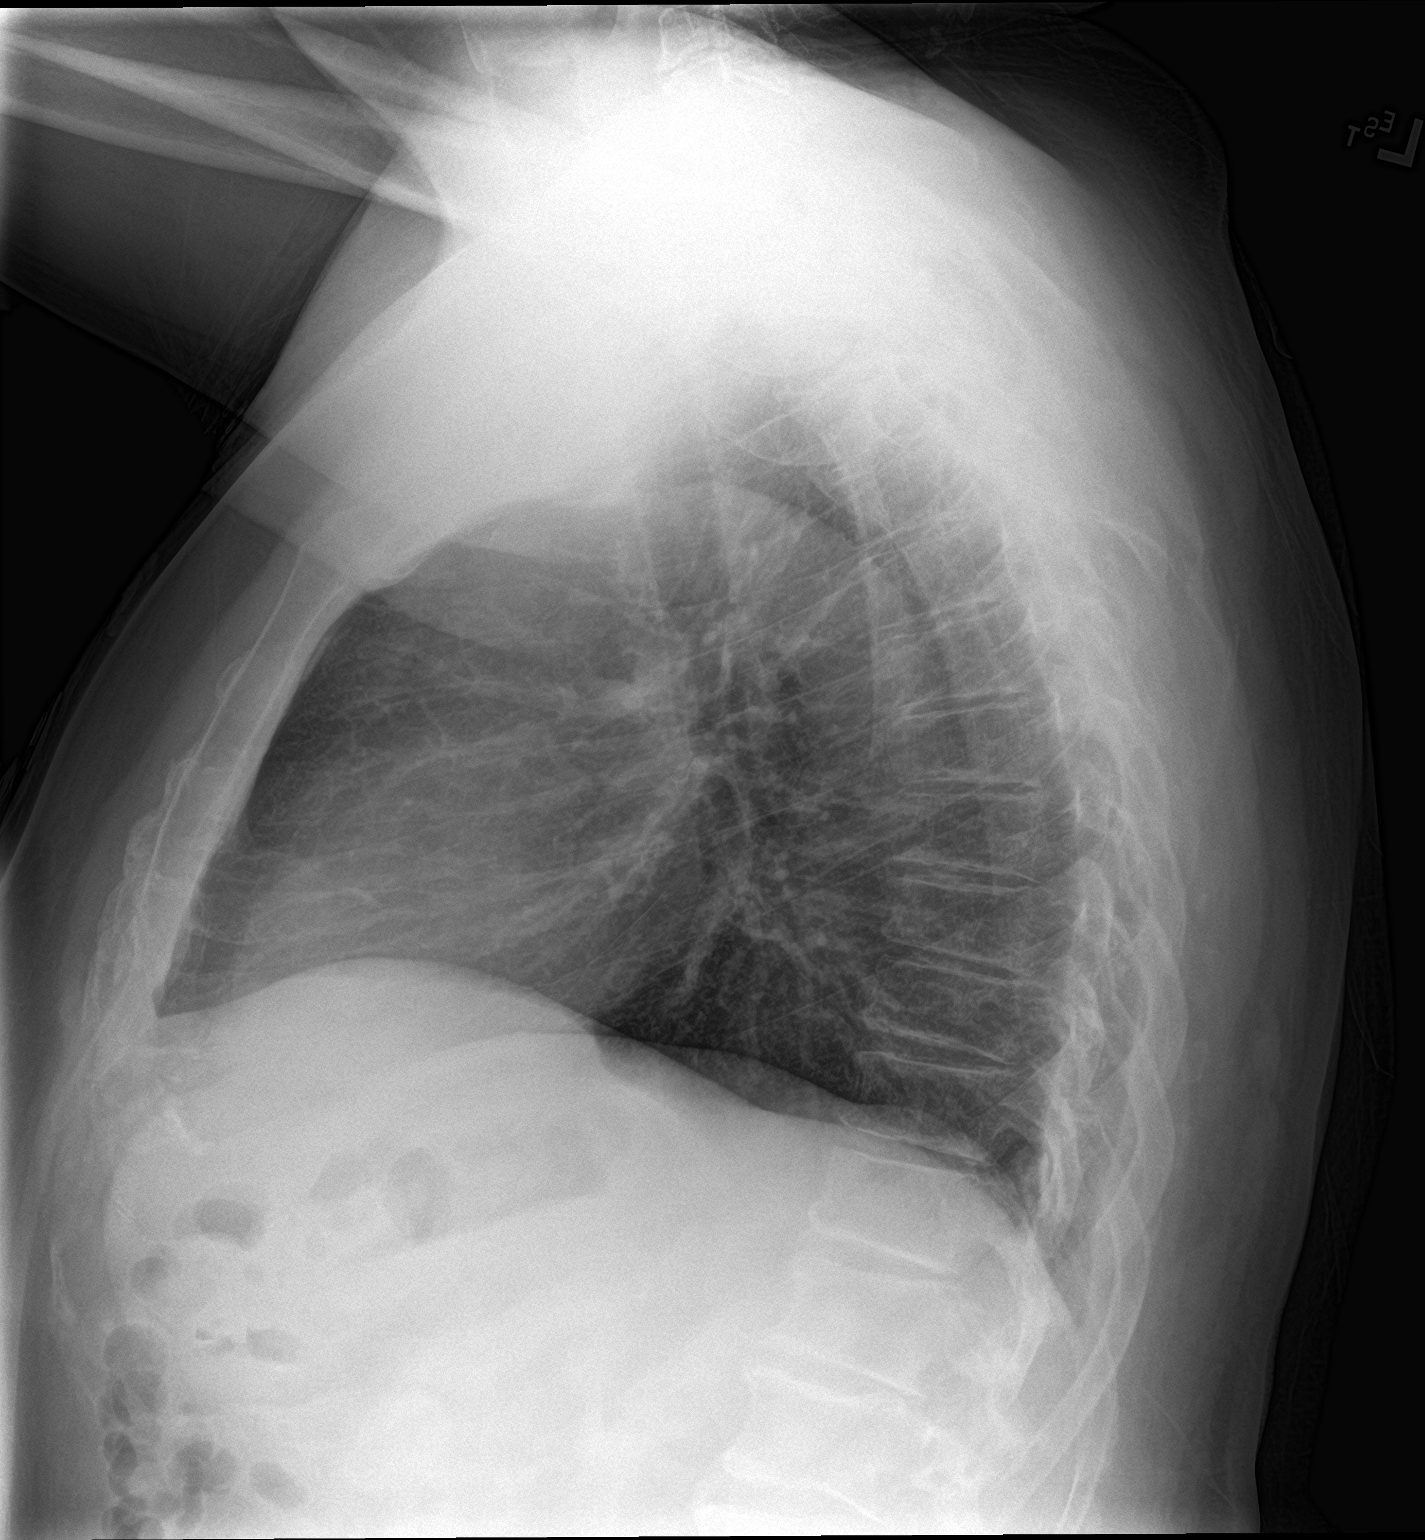

[2 of 2 positions shown; findings below may reference images not displayed]

FINDINGS: Stable cardiomediastinal silhouette with normal heart size. No
pneumothorax. No pleural effusion. Lungs appear clear, with no acute
consolidative airspace disease and no pulmonary edema.
IMPRESSION: No active cardiopulmonary disease.

## 2022-01-25 ENCOUNTER — Encounter: Payer: Medicare Other | Admitting: Internal Medicine

## 2022-01-28 DIAGNOSIS — M1712 Unilateral primary osteoarthritis, left knee: Secondary | ICD-10-CM | POA: Diagnosis not present

## 2022-01-28 DIAGNOSIS — M170 Bilateral primary osteoarthritis of knee: Secondary | ICD-10-CM | POA: Diagnosis not present

## 2022-02-01 ENCOUNTER — Telehealth: Payer: Self-pay

## 2022-02-01 NOTE — Telephone Encounter (Signed)
CPAP supply order form received from [facility], Pt has seen pulmonary (05/30/21), form signed by PCP and faxed back to [phone]. Form sent for scanning.

## 2022-02-03 ENCOUNTER — Ambulatory Visit (INDEPENDENT_AMBULATORY_CARE_PROVIDER_SITE_OTHER): Payer: Medicare Other | Admitting: Internal Medicine

## 2022-02-03 ENCOUNTER — Encounter: Payer: Self-pay | Admitting: Internal Medicine

## 2022-02-03 VITALS — BP 120/68 | HR 65 | Temp 98.1°F | Resp 18 | Ht 65.0 in | Wt 258.0 lb

## 2022-02-03 DIAGNOSIS — I10 Essential (primary) hypertension: Secondary | ICD-10-CM | POA: Diagnosis not present

## 2022-02-03 DIAGNOSIS — E119 Type 2 diabetes mellitus without complications: Secondary | ICD-10-CM | POA: Diagnosis not present

## 2022-02-03 DIAGNOSIS — E7800 Pure hypercholesterolemia, unspecified: Secondary | ICD-10-CM

## 2022-02-03 DIAGNOSIS — R0609 Other forms of dyspnea: Secondary | ICD-10-CM | POA: Diagnosis not present

## 2022-02-03 DIAGNOSIS — Z0001 Encounter for general adult medical examination with abnormal findings: Secondary | ICD-10-CM

## 2022-02-03 DIAGNOSIS — Z0000 Encounter for general adult medical examination without abnormal findings: Secondary | ICD-10-CM | POA: Diagnosis not present

## 2022-02-03 DIAGNOSIS — Z23 Encounter for immunization: Secondary | ICD-10-CM | POA: Diagnosis not present

## 2022-02-03 DIAGNOSIS — L309 Dermatitis, unspecified: Secondary | ICD-10-CM

## 2022-02-03 DIAGNOSIS — Z125 Encounter for screening for malignant neoplasm of prostate: Secondary | ICD-10-CM | POA: Diagnosis not present

## 2022-02-03 LAB — COMPREHENSIVE METABOLIC PANEL
ALT: 35 U/L (ref 0–53)
AST: 19 U/L (ref 0–37)
Albumin: 4.1 g/dL (ref 3.5–5.2)
Alkaline Phosphatase: 80 U/L (ref 39–117)
BUN: 22 mg/dL (ref 6–23)
CO2: 28 mEq/L (ref 19–32)
Calcium: 9.8 mg/dL (ref 8.4–10.5)
Chloride: 100 mEq/L (ref 96–112)
Creatinine, Ser: 1.4 mg/dL (ref 0.40–1.50)
GFR: 49.63 mL/min — ABNORMAL LOW (ref >60.00)
Glucose, Bld: 154 mg/dL — ABNORMAL HIGH (ref 70–99)
Potassium: 4.3 mEq/L (ref 3.5–5.1)
Sodium: 137 mEq/L (ref 135–145)
Total Bilirubin: 1.8 mg/dL — ABNORMAL HIGH (ref 0.2–1.2)
Total Protein: 6.6 g/dL (ref 6.0–8.3)

## 2022-02-03 LAB — CBC WITH DIFFERENTIAL/PLATELET
Basophils Absolute: 0 10*3/uL (ref 0.0–0.1)
Basophils Relative: 0.7 % (ref 0.0–3.0)
Eosinophils Absolute: 0.2 10*3/uL (ref 0.0–0.7)
Eosinophils Relative: 2.7 % (ref 0.0–5.0)
HCT: 45.5 % (ref 39.0–52.0)
Hemoglobin: 14.9 g/dL (ref 13.0–17.0)
Lymphocytes Relative: 25.3 % (ref 12.0–46.0)
Lymphs Abs: 1.8 10*3/uL (ref 0.7–4.0)
MCHC: 32.7 g/dL (ref 30.0–36.0)
MCV: 95.4 fl (ref 78.0–100.0)
Monocytes Absolute: 0.6 10*3/uL (ref 0.1–1.0)
Monocytes Relative: 8.7 % (ref 3.0–12.0)
Neutro Abs: 4.4 10*3/uL (ref 1.4–7.7)
Neutrophils Relative %: 62.6 % (ref 43.0–77.0)
Platelets: 173 10*3/uL (ref 150.0–400.0)
RBC: 4.77 Mil/uL (ref 4.22–5.81)
RDW: 14.2 % (ref 11.5–15.5)
WBC: 7.1 10*3/uL (ref 4.0–10.5)

## 2022-02-03 LAB — HEMOGLOBIN A1C: Hgb A1c MFr Bld: 7.2 % — ABNORMAL HIGH (ref 4.6–6.5)

## 2022-02-03 LAB — PSA: PSA: 2.3 ng/mL (ref 0.10–4.00)

## 2022-02-03 LAB — TSH: TSH: 0.27 u[IU]/mL — ABNORMAL LOW (ref 0.35–5.50)

## 2022-02-03 NOTE — Progress Notes (Unsigned)
Subjective:    Patient ID: Donald Jones, male    DOB: 1945/05/07, 75 y.o.   MRN: [id number]  DOS:  02/03/2022 Type of visit - description: CPX  Here for CPX. In general feels okay. He is concerned about weight gain, reports that he takes care of his wife and has no time for himself. 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  No cough, mild wheezing?. Is his observation that when he had time to go to the gym, DOE was not noticeable. Lower extremity edema: Much improved recently.  Wt Readings from Last 3 Encounters:  02/03/22 258 lb (117 kg)  12/14/21 252 lb (114.3 kg)  11/04/21 250 lb (113.4 kg)    Review of Systems  Other than above, a 14 point review of systems is negative     Past Medical History:  Diagnosis Date   Anxiety    Arthritis    hands right   Diabetes mellitus without complication (HCC)    GERD (gastroesophageal reflux disease)    Hypertension    Kidney stones    several episodes.  Last one in 2011.     Obesity    100-lb weight loss since 2011.    Polycythemia    Sleep apnea    on Cpap    Past Surgical History:  Procedure Laterality Date   CHOLECYSTECTOMY     HAND SURGERY  2010   R hand , d/t a injury, 3 surgeries    Social History   Socioeconomic History   Marital status: Married    Spouse name: Not on file   Number of children: Not on file   Years of education: Not on file   Highest education level: Not on file  Occupational History   Occupation: RETIREd ---[employer], 2021  Tobacco Use   Smoking status: Never   Smokeless tobacco: Never  Vaping Use   Vaping Use: Never used  Substance and Sexual Activity   Alcohol use: No    Comment: none for 15 years   Drug use: No   Sexual activity: Not on file  Other Topics Concern   Not on file  Social History Narrative   1ST WIFE DIED FROM LUNG CANCER, 2ND WIFE DIED FROM RENAL CANCER.     Married x 4 , lives w/ wife    3 sisters and 3 brother : lost 2 sisters    Social Determinants of Health   Financial Resource Strain: Low Risk  (09/06/2021)   Overall Financial Resource Strain (CARDIA)    Difficulty of Paying Living Expenses: Not hard at all  Food Insecurity: No Food Insecurity (09/06/2021)   Hunger Vital Sign    Worried About Running Out of Food in the Last Year: Never true    Ran Out of Food in the Last Year: Never true  Transportation Needs: No Transportation Needs (09/06/2021)   PRAPARE - Hydrologist (Medical): No    Lack of Transportation (Non-Medical): No  Physical Activity: Unknown (09/06/2021)   Exercise Vital Sign    Days of Exercise per Week: 0 days    Minutes of Exercise per Session: Not on file  Stress: No Stress Concern Present (09/06/2021)   [place]    Feeling of Stress : Not at all  Social Connections: Moderately Integrated (09/06/2021)   Social Connection and Isolation  Panel [NHANES]    Frequency of Communication with Friends and Family: More than three times a week    Frequency of Social Gatherings with Friends and Family: More than three times a week    Attends Religious Services: More than 4 times per year    Active Member of [employer] or Organizations: No    Attends Archivist Meetings: Never    Marital Status: Married  Human resources officer Violence: Not At Risk (09/06/2021)   Humiliation, Afraid, Rape, and Kick questionnaire    Fear of Current or Ex-Partner: No    Emotionally Abused: No    Physically Abused: No    Sexually Abused: No     Current Outpatient Medications  Medication Instructions   albuterol (VENTOLIN HFA) 108 (90 Base) MCG/ACT inhaler 2 puffs, Inhalation, Every 6 hours PRN   AMBULATORY NON FORMULARY MEDICATION GI Cocktail - 90 mls of 2% Lidocaine<BR>                     90 mls Dicyclomine '10mg'$ /63m<BR>                   270 mls Maalox<BR>Take 5-10 ml every 4-6  hours as needed   aspirin EC 81 mg, Oral, Daily, Swallow whole.    atorvastatin (LIPITOR) 40 MG tablet TAKE 1 TABLET BY MOUTH AT BEDTIME.   Azelastine HCl 137 MCG/SPRAY SOLN 2 sprays, Nasal, 2 times daily   budesonide-formoterol (SYMBICORT) 160-4.5 MCG/ACT inhaler 2 puffs, Inhalation, 2 times daily   [place] Oil-Levomenthol (FDGARD) 25-20.75 MG CAPS 2 tablets, Oral, 2 times daily   diclofenac sodium (VOLTAREN) 4 g, Topical, 3 times daily PRN   dicyclomine (BENTYL) 20 MG tablet TAKE 1 TABLET BY MOUTH 3 TIMES DAILY BEFORE MEALS.   escitalopram (LEXAPRO) 10 mg, Oral, Daily   famotidine (PEPCID) 20 MG tablet TAKE 1 TABLET BY MOUTH 2 TIMES DAILY BEFORE A MEAL.   Flaxseed Oil 2,000 mg, Oral, Daily   fluticasone (FLONASE) 50 MCG/ACT nasal spray 1 spray, Each Nare, Daily   furosemide (LASIX) 40 mg, Oral, Daily   hydrocortisone 2.5 % cream Topical, 2 times daily   ketoconazole (NIZORAL) 2 % cream 1 application , Topical, Daily   losartan (COZAAR) 100 mg, Oral, Daily   Multiple Vitamin (MULTIVITAMIN) tablet 1 tablet, Oral, Daily   omeprazole (PRILOSEC) 20 MG capsule TAKE 1 CAPSULE (20 MG TOTAL) BY MOUTH DAILY.   pioglitazone (ACTOS) 30 mg, Oral, Daily   sildenafil (REVATIO) 20 MG tablet TAKE 3 TO 4 TABLETS BY MOUTH AT BEDTIME AS NEEDED.   sitaGLIPtin (JANUVIA) 100 MG tablet TAKE 1 TABLET (100 MG TOTAL) BY MOUTH DAILY.       Objective:   Physical Exam BP 120/68   Pulse 65   Temp 98.1 F (36.7 C) (Oral)   Resp 18   Ht '5\' 5"'$  (1.651 m)   Wt 258 lb (117 kg)   SpO2 95%   BMI 42.93 kg/m  General: Well developed, NAD, BMI noted Neck: No  thyromegaly  HEENT:  Normocephalic . Face symmetric, atraumatic Lungs:  CTA B Normal respiratory effort, no intercostal retractions, no accessory muscle use. Heart: RRR,  no murmur.  Abdomen:  Not distended, soft, non-tender. No rebound or rigidity.   Lower extremities: Still has some edema,+/+++ , chronic skin changes. Skin: Exposed areas without rash.  Not pale. Not jaundice Neurologic:  alert & oriented X3.  Speech normal, gait appropriate for age and unassisted Strength symmetric and appropriate for age.  Psych: Cognition and judgment appear intact.  Cooperative with normal attention span and concentration.  Behavior appropriate. No anxious or depressed appearing.     Assessment    ASSESSMENT DM (pre-DM until 2016).  Intolerant to Metformin, see office visit 09/09/2019 HTN Polycythemia- sees hematology, likely d/t OSA-diuretics, JAK2 (-) ~ 2014, last OV 10-2014, f/u prn OSA on CPAP Asthma  Anxiety ("lexapro prn") DJD- saw Dr Rhona Raider before  R [place] drop  GI: -GERD - IBS? GI sx on and off, previously dx w/ IBS -Cscopes: 2549,8264, 09/18/2017 - EGD 04/2018, showed gastritis, no H. pylori, no malignancy Urolithiasis, several episodes CV: Sees cardiology, negative Myoview 2019, CT coronary angiography 08/2019: Minimal disease Chronic dermatitis: Lower extremities, pretibial   PLAN: Here for CPX DM: On Januvia, Actos, check A1c HTN: Seems controlled on current meds, checking labs Dyslipidemia: Controlled on atorvastatin. Polycythemia: History of, checking a CBC. DOE: As described above, the patient suspect deconditioning and weight gain, I agree.  Recommend gradual return to regular exercise and weight loss if possible.  Morbid obesity: Unfortunately he does not have time for himself, takes  care of his wife.  Pointers and advise provided. Anxiety: On Lexapro "as needed".  That is strategy works for the patient.  No change. RTC 4 months   In addition to CPX, chronic medical problems were addressed

## 2022-02-03 NOTE — Patient Instructions (Addendum)
Vaccines I recommend:  Covid booster RSV vaccine  Check the  blood pressure regularly BP GOAL is between 110/65 and  135/85. If it is consistently higher or lower, let me know    GO TO THE LAB : Get the blood work     [place], [place] back for   a checkup in 4 months      Advanced care planning:  Do you have a "Living will", "[place] of attorney" ?   If you already have a living will or healthcare power of attorney, is recommended you bring the copy to be scanned in your chart. The document will be available to all the doctors you see in the system.  If you don't have one, please consider create one.  Advance care planning is a process that supports adults in  understanding and sharing their preferences regarding future medical care.   The patient's preferences are recorded in documents called Advance Directives.    Advanced directives are completed (and can be modified at any time) while the patient is in full mental capacity.   The documentation should be available at all times to the patient, the family and the healthcare providers.   This legal documents direct treatment decision making and/or appoint a surrogate to make the decision if the patient is not capable to do so.    Advance directives can be documented in many types of formats,  documents have names such as:  Lliving will  Durable power of attorney for healthcare (healthcare proxy or healthcare power of attorney)  Combined directives  Physician orders for life-sustaining treatment    More information at:  [email or web address]

## 2022-02-04 ENCOUNTER — Encounter: Payer: Self-pay | Admitting: Internal Medicine

## 2022-02-04 NOTE — Assessment & Plan Note (Signed)
Here for CPX DM: On Januvia, Actos, check A1c HTN: Seems controlled on current meds, checking labs Dyslipidemia: Controlled on atorvastatin. Polycythemia: History of, checking a CBC. DOE: As described above, the patient suspect deconditioning and weight gain, I agree.  Recommend gradual return to regular exercise and weight loss if possible.  Morbid obesity: Unfortunately he does not have time for himself, takes  care of his wife.  Pointers and advise provided. Anxiety: On Lexapro "as needed".  That is strategy works for the patient.  No change. RTC 4 months

## 2022-02-04 NOTE — Assessment & Plan Note (Signed)
-  Td 2018 -- pneumonia shot 2014; prevnar: 01-2015; PNM 20: Today - Zoster  2011; s/p  shingrix x 2 -COVID VAX x3, booster discussed - RSV discussed - Flu shot today -CCS: cscope ~ 2008, Cscope again 05-2012, Cscope 09/2017: no polyp, 5 years.  So far he would be interested in pursuing colonoscopy - prostrate ca screening: DRE normal CR, no symptoms, would like to continue screening.  Check PSA. - labs:CMP, CBC, A1c TSH PSA - diet exercise discussed, unfortunately he does not have much time for himself.  Pointers provided. -Advance directive discussed.

## 2022-02-07 MED ORDER — PIOGLITAZONE HCL 45 MG PO TABS: 45.0000 mg | ORAL_TABLET | Freq: Every day | ORAL | 1 refills | Status: DC

## 2022-02-07 NOTE — Addendum Note (Signed)
Addended by: Damita Dunnings D on: 02/07/2022 11:11 AM   Modules accepted: Orders

## 2022-02-13 ENCOUNTER — Ambulatory Visit: Payer: Self-pay | Admitting: Licensed Clinical Social Worker

## 2022-02-13 NOTE — Patient Outreach (Signed)
  Care Coordination  Initial Visit Note   02/13/2022 Name: Donald Jones MRN: [id number] DOB: January 30, 1946  Donald Jones is a 76 y.o. year old male who sees [name], Alda Berthold, MD for primary care. I spoke with  Donald Jones by phone today.  What matters to the patients health and wellness today?  Phone appointment scheduled    Goals Addressed             This Visit's Progress    Care Coordination Activiites       Care Coordination Interventions: Reviewed Care Coordination Services:phone appointment scheduled            SDOH assessments and interventions completed:  No    Care Coordination Interventions Activated:  Yes  Care Coordination Interventions:  No, not indicated   Follow up plan: Follow up call scheduled for 02/16/22    Encounter Outcome:  Pt. Visit Completed   Casimer Lanius, [place] [phone]

## 2022-02-13 NOTE — Patient Instructions (Signed)
    It was a pleasure speaking with you today. Per your request a Care Coordination phone appointment is scheduled 02/16/2022  Casimer Lanius, [place] [phone]

## 2022-02-14 DIAGNOSIS — B351 Tinea unguium: Secondary | ICD-10-CM | POA: Diagnosis not present

## 2022-02-14 DIAGNOSIS — E119 Type 2 diabetes mellitus without complications: Secondary | ICD-10-CM | POA: Diagnosis not present

## 2022-02-14 DIAGNOSIS — L97909 Non-pressure chronic ulcer of unspecified part of unspecified lower leg with unspecified severity: Secondary | ICD-10-CM | POA: Diagnosis not present

## 2022-02-14 DIAGNOSIS — L989 Disorder of the skin and subcutaneous tissue, unspecified: Secondary | ICD-10-CM | POA: Diagnosis not present

## 2022-02-16 ENCOUNTER — Telehealth: Payer: Self-pay | Admitting: Pharmacist

## 2022-02-16 ENCOUNTER — Ambulatory Visit: Payer: Self-pay | Admitting: Licensed Clinical Social Worker

## 2022-02-16 ENCOUNTER — Ambulatory Visit: Payer: Medicare Other | Admitting: Pharmacist

## 2022-02-16 ENCOUNTER — Other Ambulatory Visit: Payer: Self-pay

## 2022-02-16 ENCOUNTER — Encounter: Payer: Self-pay | Admitting: Licensed Clinical Social Worker

## 2022-02-16 DIAGNOSIS — H04123 Dry eye syndrome of bilateral lacrimal glands: Secondary | ICD-10-CM | POA: Diagnosis not present

## 2022-02-16 DIAGNOSIS — D3132 Benign neoplasm of left choroid: Secondary | ICD-10-CM | POA: Diagnosis not present

## 2022-02-16 DIAGNOSIS — G4730 Sleep apnea, unspecified: Secondary | ICD-10-CM | POA: Diagnosis not present

## 2022-02-16 DIAGNOSIS — H25813 Combined forms of age-related cataract, bilateral: Secondary | ICD-10-CM | POA: Diagnosis not present

## 2022-02-16 DIAGNOSIS — E119 Type 2 diabetes mellitus without complications: Secondary | ICD-10-CM | POA: Diagnosis not present

## 2022-02-16 DIAGNOSIS — J4520 Mild intermittent asthma, uncomplicated: Secondary | ICD-10-CM

## 2022-02-16 LAB — HM DIABETES EYE EXAM

## 2022-02-16 NOTE — Patient Outreach (Signed)
  Care Coordination  Initial Visit Note   02/16/2022 Name: Donald Jones MRN: [id number] DOB: June 02, 1945  Donald Jones is a 76 y.o. year old male who sees [name], Alda Berthold, MD for primary care. I spoke with  Wendie Simmer by phone today.  What matters to the patients health and wellness today?  Diabetes and weight    Goals Addressed             This Visit's Progress    Care Coordination Activities       Care Coordination Interventions: Reviewed Care Coordination Services:interested in receiving support Medicare Annual Wellness Visit: completed in May Concerns with obtaining required medications: yes:   Januvia is working $140 for 30 days  Symbicort   $125 for 30 days  Assessed Social Determinants of Health Made referral to RN Care Coordinator : for Diabetes education and weight concerns Collaborated with pharmacy team & Referral placed:  ref  concerns with medication  Discussed options for managing stress             SDOH assessments and interventions completed:  Yes  SDOH Interventions Today    Flowsheet Row Most Recent Value  SDOH Interventions   Food Insecurity Interventions Intervention Not Indicated  Housing Interventions Intervention Not Indicated  Transportation Interventions Intervention Not Indicated  Stress Interventions Provide Counseling       Care Coordination Interventions Activated:  Yes  Care Coordination Interventions:  Yes, provided   Follow up plan:  Referral made to [facility] , Tammy Follow up call scheduled for 03/02/22 with LCSW to focus on Stress   Encounter Outcome:  Pt. Visit Completed   Casimer Lanius, [place] [phone]

## 2022-02-16 NOTE — Telephone Encounter (Signed)
Received request from [facility] social worker that patient was having difficulty affording Januvia and Symbicort. Dr [name] OK's referral for Clinical Pharmacist Practitioner to assist. Tried to contact patient today but no answer. LM on VM with my contact information [phone] or [phone].  Will continue to try to reach patient.

## 2022-02-16 NOTE — Patient Instructions (Addendum)
Visit Information  Thank you for taking time to visit with me today. Please don't hesitate to contact me if I can be of assistance to you.   Following are the goals we discussed today:   Goals Addressed             This Visit's Progress    Care Coordination Activities       Care Coordination Interventions: Reviewed Care Coordination Services:interested in receiving support Medicare Annual Wellness Visit: completed in May Concerns with obtaining required medications: yes:   Januvia is working $140 for 30 days  Symbicort   $125 for 30 days  Assessed Social Determinants of Health Made referral to RN Care Coordinator : for Diabetes education and weight concerns Collaborated with pharmacy team & Referral placed:  ref  concerns with medication  Discussed options for managing stress             Our next appointment is by telephone on 03/02/22 at 11:00  Please call the care guide team at [phone] if you need to cancel or reschedule your appointment.   Patient verbalizes understanding of instructions and care plan provided today and agrees to view in [place]. Active MyChart status and patient understanding of how to access instructions and care plan via MyChart confirmed with patient.     Casimer Lanius, [place] [phone]

## 2022-02-16 NOTE — Progress Notes (Addendum)
Pharmacy Note  02/16/2022 Name: ZYRION COEY MRN: [id number] DOB: 1945/10/05  Subjective: Donald Jones is a 76 y.o. year old male who is a primary care patient of Colon Branch, MD. Clinical Pharmacist Practitioner referral was placed to assist with medication management and assistance with medication costs.   Engaged with patient by telephone for initial visit today.  Patient reported to [place] worker that he was in Medicare coverage gap. Cost of Januvia is $140 per 30 days now and Symbicor is $125 per 30 days.   SDOH (Social Determinants of Health) assessments and interventions performed:  SDOH Interventions    Flowsheet Row Office Visit from 02/16/2022 in [place] at [facility] Most recent reading at 02/16/2022  4:46 PM Care Coordination from 02/16/2022 in [place] Most recent reading at 02/16/2022  9:43 AM Clinical Support from 09/06/2021 in [place] at [facility] Most recent reading at 09/06/2021  1:27 PM  SDOH Interventions     Food Insecurity Interventions -- Intervention Not Indicated Intervention Not Indicated  Housing Interventions -- Intervention Not Indicated Intervention Not Indicated  Transportation Interventions -- Intervention Not Indicated Intervention Not Indicated  Financial Strain Interventions Other (Comment)  [plan to apply for manufacturer assistance for Januvia and inhaler.] -- Intervention Not Indicated  Physical Activity Interventions -- -- Other (Comments)  Stress Interventions -- Provide Counseling Intervention Not Indicated  Social Connections Interventions -- -- Intervention Not Indicated        Objective: Review of patient status, including review of consultants reports, laboratory and other test data, was performed as part of comprehensive evaluation and provision of chronic care management services.   Lab Results  Component Value Date    CREATININE 1.40 02/03/2022   CREATININE 1.40 09/29/2021   CREATININE 1.44 09/15/2021    Lab Results  Component Value Date   HGBA1C 7.2 (H) 02/03/2022       Component Value Date/Time   CHOL 108 07/06/2021 1445   TRIG 38.0 07/06/2021 1445   HDL 52.00 07/06/2021 1445   CHOLHDL 2 07/06/2021 1445   VLDL 7.6 07/06/2021 1445   LDLCALC 49 07/06/2021 1445   LDLCALC 49 08/06/2020 1406     Clinical ASCVD: Type 2 DM The ASCVD Risk score (Arnett DK, et al., 2019) failed to calculate for the following reasons:   The valid total cholesterol range is 130 to 320 mg/dL    BP Readings from Last 3 Encounters:  02/03/22 120/68  12/14/21 120/80  11/04/21 132/84     Allergies  Allergen Reactions   Metformin And Related     Intolerant, GI symptoms   Sulfa Antibiotics Nausea And Vomiting    Medications Reviewed Today     Reviewed by Cherre Robins, RPH-CPP (Pharmacist) on 02/16/22 at 1641  Med List Status: <None>   Medication Order Taking? Sig Documenting Provider Last Dose Status Informant  albuterol (VENTOLIN HFA) 108 (90 Base) MCG/ACT inhaler [id number] Yes Inhale 2 puffs into the lungs every 6 (six) hours as needed for wheezing or shortness of breath. Colon Branch, MD Taking Active   AMBULATORY NON FORMULARY MEDICATION [id number] No GI Cocktail - 90 mls of 2% Lidocaine                      90 mls Dicyclomine '10mg'$ /40m                    270 mls  Maalox Take 5-10 ml every 4-6 hours as needed  Patient not taking: Reported on 02/16/2022   Barrett, Evelene Croon, PA-C Not Taking Active            Med Note Antony Contras, Chadric Kimberley B   Thu Feb 16, 2022  4:36 PM) Hasn't used recently   aspirin EC 81 MG tablet [id number] Yes Take 81 mg by mouth daily. Each morning. Swallow whole. [provider] Taking Active   atorvastatin (LIPITOR) 40 MG tablet [id number] Yes TAKE 1 TABLET BY MOUTH AT BEDTIME. Colon Branch, MD Taking Active   budesonide-formoterol [facility]) 160-4.5 MCG/ACT inhaler [id number] Yes Inhale  2 puffs into the lungs 2 (two) times daily. Colon Branch, MD Taking Active   [place] Oil-Levomenthol (FDGARD) 25-20.75 MG CAPS [id number] Yes Take 2 tablets by mouth in the morning and at bedtime.  [provider] Taking Active Self  diclofenac sodium (VOLTAREN) 1 % GEL [id number] Yes Apply 4 g topically 3 (three) times daily as needed. Colon Branch, MD Taking Active Self  dicyclomine (BENTYL) 20 MG tablet [id number] Yes TAKE 1 TABLET BY MOUTH 3 TIMES DAILY BEFORE MEALS. Colon Branch, MD Taking Active   escitalopram (LEXAPRO) 10 MG tablet [id number] Yes TAKE 1 TABLET (10 MG TOTAL) BY MOUTH DAILY. Colon Branch, MD Taking Active   famotidine (PEPCID) 20 MG tablet [id number] Yes TAKE 1 TABLET BY MOUTH 2 TIMES DAILY BEFORE A MEAL. Colon Branch, MD Taking Active   Flaxseed, Linseed, (FLAXSEED OIL) 1000 MG CAPS [id number] Yes Take 2,000 mg by mouth daily.  [provider] Taking Active Self  fluticasone (FLONASE) 50 MCG/ACT nasal spray [id number] Yes Place 1 spray into both nostrils daily. Colon Branch, MD Taking Active   furosemide (LASIX) 40 MG tablet [id number] Yes Take 1 tablet (40 mg total) by mouth daily. Colon Branch, MD Taking Active   hydrocortisone 2.5 % cream [id number] Yes Apply topically 2 (two) times daily. Colon Branch, MD Taking Active Self  losartan (COZAAR) 100 MG tablet [id number] Yes Take 1 tablet (100 mg total) by mouth daily. Colon Branch, MD Taking Active   Multiple Vitamin (MULTIVITAMIN) tablet [id number] Yes Take 1 tablet by mouth daily. [provider] Taking Active Self  omeprazole (PRILOSEC) 20 MG capsule [id number] Yes TAKE 1 CAPSULE (20 MG TOTAL) BY MOUTH DAILY. Colon Branch, MD Taking Active   pioglitazone (ACTOS) 45 MG tablet [id number] Yes Take 1 tablet (45 mg total) by mouth daily. Colon Branch, MD Taking Active   sildenafil (REVATIO) 20 MG tablet [id number] Yes TAKE 3 TO 4 TABLETS BY MOUTH AT BEDTIME AS NEEDED. Colon Branch, MD Taking Active   sitaGLIPtin (JANUVIA) 100 MG  tablet [id number] Yes TAKE 1 TABLET (100 MG TOTAL) BY MOUTH DAILY. Colon Branch, MD Taking Active             Patient Active Problem List   Diagnosis Date Noted   Chronic stasis dermatitis 11/06/2021   Chronic dermatitis- legs 09/01/2021   Asthma 04/06/2021   Anxiety 07/07/2019   High cholesterol 01/20/2018   DJD (degenerative joint disease) 09/06/2017   Erectile dysfunction 09/06/2017   PCP NOTES >>> 01/23/2015   Subclinical hyperthyroidism 10/05/2014   Diabetes mellitus without complication ([place]) 01/31/5101   Morbid obesity ([place]) 12/03/2012   GERD (gastroesophageal reflux disease) 09/24/2012   IBS ? 04/22/2012   Polycythemia    Annual physical exam 06/05/2011   Foot drop, right 06/05/2011   HYPERTENSION, BENIGN  SYSTEMIC 06/14/2006   RHINITIS, ALLERGIC 06/14/2006   NEPHROLITHIASIS 06/14/2006   OSA (obstructive sleep apnea) 06/14/2006     Medication Assistance: Screened for medication assistance program for Januvia.   Application for Januvia  medication assistance program. in process.  Anticipated assistance start date 03/17/2022.  See plan of care for additional detail.   Assessment / Plan: Type 2 DM -  Screened for medication assistance program for Januvia. Patient should qualify for medication assistance program for Januvia. Started application process.  Continue Januvia '100mg'$  daily (monitor eGR - no dose adjustment current needed) and pioglitazone '45mg'$  daily. If patient has edema - consider lowering dose of pioglitazone and consider SGLT 2 for DM ([place] or [place]) or could consider GLP1 / Mounjaro as well. He would likely qualify for medication assistance program for these options as well.  Asthma Symbicort is no longer available thru [place] [facility] [employer] medication assistance program since it is available in a generic version. Generic version is still about $120 / month with GoodRx card) Even in 2024 looks like brand Symbicort will be preferred at $47/month on patient's  insurance. Also checked Wixela (generic Advair) and it too is tier 3 or $47/month and now patient is in coverage gap even with Good Rx would be $100. Checked other options asthma control that have medication assistance programs - patient could change to [facility] - will check with PCP to see if this is OK.  If approved will start medication assistance program application for [place]. Patient did confirm that his household has spent at least $600 in 2023 on medications.    Follow Up:  Will discuss options with PCP and follow up with application and plan within the next 1 to 2 week.    Cherre Robins, PharmD Clinical Pharmacist [place] [phone]  02/17/2022 - Dr [name] approved change from Symbicort to [facility]. Printed Rx for [place] to accompany medication assistance program application.

## 2022-02-17 MED ORDER — FLUTICASONE FUROATE-VILANTEROL 100-25 MCG/ACT IN AEPB: 1.0000 | INHALATION_SPRAY | Freq: Every day | RESPIRATORY_TRACT | 3 refills | Status: DC

## 2022-02-17 NOTE — Addendum Note (Signed)
Addended by: Cherre Robins B on: 02/17/2022 03:49 PM   Modules accepted: Orders

## 2022-02-20 ENCOUNTER — Encounter: Payer: Self-pay | Admitting: Internal Medicine

## 2022-02-20 ENCOUNTER — Ambulatory Visit: Payer: Self-pay

## 2022-02-20 NOTE — Patient Outreach (Signed)
  Care Coordination   Follow Up Visit Note   02/20/2022 Name: SAJAN CHEATWOOD MRN: [id number] DOB: February 08, 1946  Wendie Simmer is a 76 y.o. year old male who sees [name], Alda Berthold, MD for primary care. I spoke with  Wendie Simmer by phone today.  What matters to the patients health and wellness today?  Patient reports he is interested in learning more specifics about nutrition and diabetes. He reports it has been a couple of years since attending a formal class and is aware of a weekly group class that [facility] would [facility]. Patient reports he is checking his blood sugar and today blood sugar was 121. A1C 7.2 on 02/03/22 increased from 6.5 on 05/24/21. He reports was getting cortisone injections and thinks this may have contributed to the increase in his A1C.   Goals Addressed             This Visit's Progress    Maintain and/or improve health diabetes education       Care Coordination Interventions: Discussed patient's desire to learn more about nutrition and diabetes Reviewed signs/symptoms of hypoglycemia and treatment-fast heartbeat, shaking , sweating, anxiety, irritability confusion, dizziness, hunger Discussed general target blood sugar ranges 80-130 before meals and less than 180 2 hours after meals.  Care coordinator encouraged patient to discuss target blood sugar range with primary care provider for recommendations Discussed healthy snacks for diabetics: eggs, yogurt with berries; nuts; peanut butter and apples; vegetables, hummus/chickpeas; avocada; cheese crackers; tuna salad; celery Discussed community resources such as the diabetes and nutrition education center at [employer] number provided [phone]. Patient to contact Provided education material diabetes and nutrition       SDOH assessments and interventions completed:  Yes  SDOH Interventions Today    Flowsheet Row Most Recent Value  SDOH Interventions   Utilities Interventions  Intervention Not Indicated     Care Coordination Interventions Activated:  Yes  Care Coordination Interventions:  Yes, provided   Follow up plan: Follow up call scheduled for 03/23/22    Encounter Outcome:  Pt. Visit Completed   Thea Silversmith, RN, MSN, BSN, [place] Coordinator [phone]

## 2022-02-20 NOTE — Patient Instructions (Signed)
Visit Information  Thank you for taking time to visit with me today. Please don't hesitate to contact me if I can be of assistance to you.   Following are the goals we discussed today:   Goals Addressed             This Visit's Progress    Maintain and/or improve health diabetes education       Care Coordination Interventions: Discussed patient's desire to learn more about nutrition and diabetes Reviewed signs/symptoms of hypoglycemia and treatment-fast heartbeat, shaking , sweating, anxiety, irritability confusion, dizziness, hunger Discussed general target blood sugar ranges 80-130 before meals and less than 180 2 hours after meals.  Care coordinator encouraged patient to discuss target blood sugar range with primary care provider for recommendations Discussed healthy snacks for diabetics: eggs, yogurt with berries; nuts; peanut butter and apples; vegetables, hummus/chickpeas; avocada; cheese crackers; tuna salad; celery Discussed community resources such as the diabetes and nutrition education center at [employer] number provided [phone]. Patient to contact Provided education material diabetes and nutrition       Our next appointment is by telephone on 03/23/22 at 10:30  Please call the care guide team at [phone] if you need to cancel or reschedule your appointment.   If you are experiencing a Mental Health or [place] or need someone to talk to, please call the Suicide and Crisis Lifeline: 988  Patient verbalizes understanding of instructions and care plan provided today and agrees to view in [place]. Active MyChart status and patient understanding of how to access instructions and care plan via MyChart confirmed with patient.     Thea Silversmith, RN, MSN, BSN, CCM [facility] Care Coordinator [phone]   Diabetes Mellitus and Nutrition, Adult When you have diabetes, or diabetes mellitus, it is very important to have healthy eating habits because your  blood sugar (glucose) levels are greatly affected by what you eat and drink. Eating healthy foods in the right amounts, at about the same times every day, can help you: Manage your blood glucose. Lower your risk of heart disease. Improve your blood pressure. Reach or maintain a healthy weight. What can affect my meal plan? Every person with diabetes is different, and each person has different needs for a meal plan. Your health care provider may recommend that you work with a dietitian to make a meal plan that is best for you. Your meal plan may vary depending on factors such as: The calories you need. The medicines you take. Your weight. Your blood glucose, blood pressure, and cholesterol levels. Your activity level. Other health conditions you have, such as heart or kidney disease. How do carbohydrates affect me? Carbohydrates, also called carbs, affect your blood glucose level more than any other type of food. Eating carbs raises the amount of glucose in your blood. It is important to know how many carbs you can safely have in each meal. This is different for every person. Your dietitian can help you calculate how many carbs you should have at each meal and for each snack. How does alcohol affect me? Alcohol can cause a decrease in blood glucose (hypoglycemia), especially if you use insulin or take certain diabetes medicines by mouth. Hypoglycemia can be a life-threatening condition. Symptoms of hypoglycemia, such as sleepiness, dizziness, and confusion, are similar to symptoms of having too much alcohol. Do not drink alcohol if: Your health care provider tells you not to drink. You are pregnant, may be pregnant, or are planning to become pregnant. If  you drink alcohol: Limit how much you have to: 0-1 drink a day for women. 0-2 drinks a day for men. Know how much alcohol is in your drink. In the [place] one drink equals one 12 oz bottle of beer (355 mL), one 5 oz glass of wine (148 mL), or  one 1 oz glass of hard liquor (44 mL). Keep yourself hydrated with water, diet soda, or unsweetened iced tea. Keep in mind that regular soda, juice, and other mixers may contain a lot of sugar and must be counted as carbs. What are tips for following this plan?  Reading food labels Start by checking the serving size on the Nutrition Facts label of packaged foods and drinks. The number of calories and the amount of carbs, fats, and other nutrients listed on the label are based on one serving of the item. Many items contain more than one serving per package. Check the total grams (g) of carbs in one serving. Check the number of grams of saturated fats and trans fats in one serving. Choose foods that have a low amount or none of these fats. Check the number of milligrams (mg) of salt (sodium) in one serving. Most people should limit total sodium intake to less than 2,300 mg per day. Always check the nutrition information of foods labeled as "low-fat" or "nonfat." These foods may be higher in added sugar or refined carbs and should be avoided. Talk to your dietitian to identify your daily goals for nutrients listed on the label. Shopping Avoid buying canned, pre-made, or processed foods. These foods tend to be high in fat, sodium, and added sugar. Shop around the outside edge of the grocery store. This is where you will most often find fresh fruits and vegetables, bulk grains, fresh meats, and fresh dairy products. Cooking Use low-heat cooking methods, such as baking, instead of high-heat cooking methods, such as deep frying. Cook using healthy oils, such as olive, canola, or sunflower oil. Avoid cooking with butter, cream, or high-fat meats. Meal planning Eat meals and snacks regularly, preferably at the same times every day. Avoid going long periods of time without eating. Eat foods that are high in fiber, such as fresh fruits, vegetables, beans, and whole grains. Eat 4-6 oz (112-168 g) of lean  protein each day, such as lean meat, chicken, fish, eggs, or tofu. One ounce (oz) (28 g) of lean protein is equal to: 1 oz (28 g) of meat, chicken, or fish. 1 egg.  cup (62 g) of tofu. Eat some foods each day that contain healthy fats, such as avocado, nuts, seeds, and fish. What foods should I eat? Fruits Berries. Apples. Oranges. Peaches. Apricots. Plums. Grapes. Mangoes. Papayas. Pomegranates. Kiwi. Cherries. Vegetables Leafy greens, including lettuce, spinach, kale, chard, collard greens, mustard greens, and cabbage. Beets. Cauliflower. Broccoli. Carrots. Green beans. Tomatoes. Peppers. Onions. Cucumbers. Brussels sprouts. Grains Whole grains, such as whole-wheat or whole-grain bread, crackers, tortillas, cereal, and pasta. Unsweetened oatmeal. Quinoa. Brown or wild rice. Meats and other proteins Seafood. Poultry without skin. Lean cuts of poultry and beef. Tofu. Nuts. Seeds. Dairy Low-fat or fat-free dairy products such as milk, yogurt, and cheese. The items listed above may not be a complete list of foods and beverages you can eat and drink. Contact a dietitian for more information. What foods should I avoid? Fruits Fruits canned with syrup. Vegetables Canned vegetables. Frozen vegetables with butter or cream sauce. Grains Refined white flour and flour products such as bread, pasta, snack foods, and  cereals. Avoid all processed foods. Meats and other proteins Fatty cuts of meat. Poultry with skin. Breaded or fried meats. Processed meat. Avoid saturated fats. Dairy Full-fat yogurt, cheese, or milk. Beverages Sweetened drinks, such as soda or iced tea. The items listed above may not be a complete list of foods and beverages you should avoid. Contact a dietitian for more information. Questions to ask a health care provider Do I need to meet with a certified diabetes care and education specialist? Do I need to meet with a dietitian? What number can I call if I have  questions? When are the best times to check my blood glucose? Where to find more information: American Diabetes Association: diabetes.org Academy of Nutrition and Dietetics: eatright.[employer] of Diabetes and Digestive and Kidney Diseases: [employer].is Association of Diabetes Care & Education Specialists: diabeteseducator.org Summary It is important to have healthy eating habits because your blood sugar (glucose) levels are greatly affected by what you eat and drink. It is important to use alcohol carefully. A healthy meal plan will help you manage your blood glucose and lower your risk of heart disease. Your health care provider may recommend that you work with a dietitian to make a meal plan that is best for you. This information is not intended to replace advice given to you by your health care provider. Make sure you discuss any questions you have with your health care provider. Document Revised: 11/05/2019 Document Reviewed: 11/05/2019 Elsevier Patient Education  [place].

## 2022-02-23 NOTE — Telephone Encounter (Signed)
Levander Campion (spouse DPR ok) called to go over available info with Tammy. Tammy was with another pt at the time and advised that a note would be sent back to call back later. Dianne acknowledged understanding.

## 2022-02-23 NOTE — Telephone Encounter (Signed)
Answered patient's and his wife's questions about patient assistance program applications. They have not received in mail yet. Anticipate it should be today or tomorrow but will leave additional applications at the front desk for patient to pick up incase he dose not get in mail.

## 2022-02-27 ENCOUNTER — Other Ambulatory Visit: Payer: Self-pay | Admitting: Internal Medicine

## 2022-03-02 ENCOUNTER — Ambulatory Visit: Payer: Self-pay | Admitting: Licensed Clinical Social Worker

## 2022-03-02 NOTE — Patient Instructions (Signed)
Visit Information  Thank you for taking time to visit with me today. Please don't hesitate to contact me if I can be of assistance to you.   Following are the goals we discussed today:   Goals Addressed             This Visit's Progress    COMPLETED: Care Coordination Activities No Follow up Required       Care Coordination Interventions: Made referral to RN Care Coordinator : patient has connected Made referral to pharmacy team:Patient has connected Problem [place] strategies reviewed Discussed Advance Directives: Patient will bring to next Dr. Appointment to scan into chart                Please call the care guide team at [phone] if you need to cancel or reschedule your appointment.   Patient verbalizes understanding of instructions and care plan provided today and agrees to view in [place]. Active MyChart status and patient understanding of how to access instructions and care plan via MyChart confirmed with patient.     No further follow up required: By [place], [place] [phone]

## 2022-03-02 NOTE — Patient Outreach (Signed)
  Care Coordination  Follow Up Visit Note   03/02/2022 Name: Donald Jones MRN: [id number] DOB: 05-Dec-1945  Donald Jones is a 76 y.o. year old male who sees [name], Alda Berthold, MD for primary care. I spoke with  Donald Jones by phone today.  What matters to the patients health and wellness today?    Patient has made contact with pharmacy team and RN care manager Reports no additional needs or concerns at this time .   Recommendation: Patient may benefit from, and is in agreement to Follow up on information provided by [facility] Manager  Bring Advance Directive to scan into chart.     Goals Addressed             This Visit's Progress    COMPLETED: Care Coordination Activities No Follow up Required       Care Coordination Interventions: Made referral to RN Care Coordinator : patient has connected Made referral to pharmacy team:Patient has connected Problem Center strategies reviewed Discussed Advance Directives: Patient will bring to next Dr. Appointment to scan into chart                SDOH assessments and interventions completed:  Yes  SDOH Interventions Today    Flowsheet Row Most Recent Value  SDOH Interventions   Stress Interventions Intervention Not Indicated       Care Coordination Interventions Activated:  Yes  Care Coordination Interventions:  Yes, provided   Follow up plan:  No further intervention required by LCSW Patient will continue working with RN Care Manager  Encounter Outcome:  Pt. Visit Completed   Casimer Lanius, [place] [phone]

## 2022-03-06 ENCOUNTER — Ambulatory Visit: Payer: Medicare Other | Admitting: Pharmacist

## 2022-03-06 DIAGNOSIS — J4520 Mild intermittent asthma, uncomplicated: Secondary | ICD-10-CM

## 2022-03-06 DIAGNOSIS — G4733 Obstructive sleep apnea (adult) (pediatric): Secondary | ICD-10-CM

## 2022-03-06 DIAGNOSIS — E119 Type 2 diabetes mellitus without complications: Secondary | ICD-10-CM

## 2022-03-06 NOTE — Progress Notes (Signed)
Pharmacy Note  03/06/2022 Name: Donald Jones MRN: [id number] DOB: Nov 06, 1945  Subjective: Donald Jones is a 76 y.o. year old male who is a primary care patient of Colon Branch, MD. Clinical Pharmacist Practitioner referral was placed to assist with medication management and assistance with medication costs.   Engaged with patient by telephone for initial visit today.  Patient reported to [place] worker that he was in Medicare coverage gap. Cost of Januvia is $140 per 30 days now and Symbicor is $125 per 30 days. Mailed applications to him about 2 weeks ago.   Patient also asked about dry nasal passages. He has a CPAP that he uses at night.    Objective: Review of patient status, including review of consultants reports, laboratory and other test data, was performed as part of comprehensive evaluation and provision of chronic care management services.   Lab Results  Component Value Date   CREATININE 1.40 02/03/2022   CREATININE 1.40 09/29/2021   CREATININE 1.44 09/15/2021    Lab Results  Component Value Date   HGBA1C 7.2 (H) 02/03/2022       Component Value Date/Time   CHOL 108 07/06/2021 1445   TRIG 38.0 07/06/2021 1445   HDL 52.00 07/06/2021 1445   CHOLHDL 2 07/06/2021 1445   VLDL 7.6 07/06/2021 1445   LDLCALC 49 07/06/2021 1445   LDLCALC 49 08/06/2020 1406     Clinical ASCVD: Type 2 DM The ASCVD Risk score (Arnett DK, et al., 2019) failed to calculate for the following reasons:   The valid total cholesterol range is 130 to 320 mg/dL    BP Readings from Last 3 Encounters:  02/03/22 120/68  12/14/21 120/80  11/04/21 132/84     Allergies  Allergen Reactions   Metformin And Related     Intolerant, GI symptoms   Sulfa Antibiotics Nausea And Vomiting    Medications Reviewed Today     Reviewed by Luretha Rued, RN (Registered Nurse) on 02/20/22 at 58  Med List Status: <None>   Medication Order Taking? Sig Documenting Provider Last Dose Status  Informant  albuterol (VENTOLIN HFA) 108 (90 Base) MCG/ACT inhaler [id number] No Inhale 2 puffs into the lungs every 6 (six) hours as needed for wheezing or shortness of breath. Colon Branch, MD Taking Active   AMBULATORY NON FORMULARY MEDICATION [id number] No GI Cocktail - 90 mls of 2% Lidocaine                      90 mls Dicyclomine '10mg'$ /34m                    270 mls Maalox Take 5-10 ml every 4-6 hours as needed  Patient not taking: Reported on 02/16/2022   Barrett, [name] PA-C Not Taking Active            Med Note (Antony Contras Shaarav Ripple B   Thu Feb 16, 2022  4:36 PM) Hasn't used recently   aspirin EC 81 MG tablet [id number] Take 81 mg by mouth daily. Each morning. Swallow whole. [provider] Taking Active   atorvastatin (LIPITOR) 40 MG tablet [id number] TAKE 1 TABLET BY MOUTH AT BEDTIME. PColon Branch MD Taking Active   Caraway Oil-Levomenthol (FDGARD) 25-20.75 MG CAPS [id number] Take 2 tablets by mouth in the morning and at bedtime.  [provider] Taking Active Self  diclofenac sodium (VOLTAREN) 1 % GEL [id number] Apply 4 g topically  3 (three) times daily as needed. Colon Branch, MD Taking Active Self  dicyclomine (BENTYL) 20 MG tablet [id number] No TAKE 1 TABLET BY MOUTH 3 TIMES DAILY BEFORE MEALS. Colon Branch, MD Taking Active   escitalopram (LEXAPRO) 10 MG tablet [id number] No TAKE 1 TABLET (10 MG TOTAL) BY MOUTH DAILY. Colon Branch, MD Taking Active   famotidine (PEPCID) 20 MG tablet [id number] No TAKE 1 TABLET BY MOUTH 2 TIMES DAILY BEFORE A MEAL. Colon Branch, MD Taking Active   Flaxseed, Linseed, (FLAXSEED OIL) 1000 MG CAPS [id number] No Take 2,000 mg by mouth daily.  [provider] Taking Active Self  fluticasone (FLONASE) 50 MCG/ACT nasal spray [id number] No Place 1 spray into both nostrils daily. Colon Branch, MD Taking Active   fluticasone furoate-vilanterol (BREO ELLIPTA) 100-25 MCG/ACT AEPB [id number]  Inhale 1 puff into the lungs daily. Rinse mouth after  each use. Colon Branch, MD  Active   furosemide (LASIX) 40 MG tablet [id number] No Take 1 tablet (40 mg total) by mouth daily. Colon Branch, MD Taking Active   hydrocortisone 2.5 % cream [id number] No Apply topically 2 (two) times daily. Colon Branch, MD Taking Active Self  losartan (COZAAR) 100 MG tablet [id number] No Take 1 tablet (100 mg total) by mouth daily. Colon Branch, MD Taking Active   Multiple Vitamin (MULTIVITAMIN) tablet [id number] No Take 1 tablet by mouth daily. [provider] Taking Active Self  omeprazole (PRILOSEC) 20 MG capsule [id number] No TAKE 1 CAPSULE (20 MG TOTAL) BY MOUTH DAILY. Colon Branch, MD Taking Active   pioglitazone (ACTOS) 45 MG tablet [id number] No Take 1 tablet (45 mg total) by mouth daily. Colon Branch, MD Taking Active   sildenafil (REVATIO) 20 MG tablet [id number] No TAKE 3 TO 4 TABLETS BY MOUTH AT BEDTIME AS NEEDED. Colon Branch, MD Taking Active   sitaGLIPtin (JANUVIA) 100 MG tablet [id number] No TAKE 1 TABLET (100 MG TOTAL) BY MOUTH DAILY. Colon Branch, MD Taking Active             Patient Active Problem List   Diagnosis Date Noted   Chronic stasis dermatitis 11/06/2021   Chronic dermatitis- legs 09/01/2021   Asthma 04/06/2021   Anxiety 07/07/2019   High cholesterol 01/20/2018   DJD (degenerative joint disease) 09/06/2017   Erectile dysfunction 09/06/2017   PCP NOTES >>> 01/23/2015   Subclinical hyperthyroidism 10/05/2014   Diabetes mellitus without complication ([place]) 97/74/1423   Morbid obesity ([place]) 12/03/2012   GERD (gastroesophageal reflux disease) 09/24/2012   IBS ? 04/22/2012   Polycythemia    Annual physical exam 06/05/2011   Foot drop, right 06/05/2011   HYPERTENSION, BENIGN SYSTEMIC 06/14/2006   RHINITIS, ALLERGIC 06/14/2006   NEPHROLITHIASIS 06/14/2006   OSA (obstructive sleep apnea) 06/14/2006     Medication Assistance: Screened for medication assistance program for Januvia.   Application for Januvia  medication assistance  program. in process.  Anticipated assistance start date 03/17/2022.  See plan of care for additional detail.   Assessment / Plan: Type 2 DM -  Reminded patient to complete Januvia medication assistance program applications and get back to office ASAP.  Continue Januvia '100mg'$  daily (monitor eGR - no dose adjustment current needed) and pioglitazone '45mg'$  daily. If patient has edema - consider lowering dose of pioglitazone and consider SGLT 2 for DM (Jardiance or [place]) or could consider GLP1 / Mounjaro as well. He would likely qualify for medication assistance program for these options  as well.  Asthma Symbicort is no longer available thru [employer] medication assistance program since it is available in a generic version. . With approval of PCP, we are applying for medication assistance program for [facility] inhaler. Patient is reminded to complete application for bring back to our office.  Regarding dry nasal passages - discussed increasing humidification on his CPAP. Also recommended he could use saline nose drops or AYR saline gel.    Follow Up:  Follow up in 2 to 3 weeks .   Cherre Robins, PharmD Clinical Pharmacist [place] [phone]

## 2022-03-15 ENCOUNTER — Ambulatory Visit (INDEPENDENT_AMBULATORY_CARE_PROVIDER_SITE_OTHER): Payer: Medicare Other | Admitting: Pharmacist

## 2022-03-15 DIAGNOSIS — E119 Type 2 diabetes mellitus without complications: Secondary | ICD-10-CM

## 2022-03-15 DIAGNOSIS — J4520 Mild intermittent asthma, uncomplicated: Secondary | ICD-10-CM

## 2022-03-15 NOTE — Progress Notes (Signed)
Pharmacy Note  03/15/2022 Name: Donald Jones MRN: [id number] DOB: [birth year]  Subjective: Donald Jones is a 76 y.o. year old male who is a primary care patient of Colon Branch, MD. Clinical Pharmacist Practitioner referral was placed to assist with medication management and assistance with medication costs.   Engaged with patient by telephone for initial visit today.  Patient completed medication assistance program applications for Januvia and Breo.  He is in Medicare coverage gap and sost of Januvia is $140 per 30 days and Symbicort $125 per 30 days. With approval of PCP Symbicort was changed to [facility] and we are applying for medication assistance program.   Objective: Review of patient status, including review of consultants reports, laboratory and other test data, was performed as part of comprehensive evaluation and provision of chronic care management services.   Lab Results  Component Value Date   CREATININE 1.40 02/03/2022   CREATININE 1.40 09/29/2021   CREATININE 1.44 09/15/2021    Lab Results  Component Value Date   HGBA1C 7.2 (H) 02/03/2022       Component Value Date/Time   CHOL 108 07/06/2021 1445   TRIG 38.0 07/06/2021 1445   HDL 52.00 07/06/2021 1445   CHOLHDL 2 07/06/2021 1445   VLDL 7.6 07/06/2021 1445   LDLCALC 49 07/06/2021 1445   LDLCALC 49 08/06/2020 1406     Clinical ASCVD: Type 2 DM The ASCVD Risk score (Arnett DK, et al., 2019) failed to calculate for the following reasons:   The valid total cholesterol range is 130 to 320 mg/dL    BP Readings from Last 3 Encounters:  02/03/22 120/68  12/14/21 120/80  11/04/21 132/84     Allergies  Allergen Reactions   Metformin And Related     Intolerant, GI symptoms   Sulfa Antibiotics Nausea And Vomiting    Medications Reviewed Today     Reviewed by Cherre Robins, RPH-CPP (Pharmacist) on 03/15/22 at 0944  Med List Status: <None>   Medication Order Taking? Sig Documenting Provider Last Dose  Status Informant  albuterol (VENTOLIN HFA) 108 (90 Base) MCG/ACT inhaler [id number]  Inhale 2 puffs into the lungs every 6 (six) hours as needed for wheezing or shortness of breath. Colon Branch, MD  Active   AMBULATORY Baruch Gouty MEDICATION [id number] No GI Cocktail - 90 mls of 2% Lidocaine                      90 mls Dicyclomine '10mg'$ /61m                    270 mls Maalox Take 5-10 ml every 4-6 hours as needed  Patient not taking: Reported on 02/16/2022   Barrett, [name] PA-C Not Taking Active            Med Note (Antony Contras Tashai Catino B   Thu Feb 16, 2022  4:36 PM) Hasn't used recently   aspirin EC 81 MG tablet [id number] Take 81 mg by mouth daily. Each morning. Swallow whole. [provider]  Active   atorvastatin (LIPITOR) 40 MG tablet [id number] TAKE 1 TABLET BY MOUTH AT BEDTIME. PColon Branch MD Taking Active   Caraway Oil-Levomenthol (FDGARD) 25-20.75 MG CAPS [id number] Take 2 tablets by mouth in the morning and at bedtime.  [provider]  Active Self  diclofenac sodium (VOLTAREN) 1 % GEL [id number] Apply 4 g topically 3 (three) times daily as needed. [address] JBradfordE,  MD  Active Self  dicyclomine (BENTYL) 20 MG tablet [id number]  TAKE 1 TABLET BY MOUTH 3 TIMES DAILY BEFORE MEALS. Colon Branch, MD  Active   escitalopram (LEXAPRO) 10 MG tablet [id number] Yes Take 1 tablet (10 mg total) by mouth daily. Colon Branch, MD Taking Active   famotidine (PEPCID) 20 MG tablet [id number] Yes TAKE 1 TABLET BY MOUTH 2 TIMES DAILY BEFORE A MEAL. Colon Branch, MD Taking Active   Flaxseed, Linseed, (FLAXSEED OIL) 1000 MG CAPS [id number]  Take 2,000 mg by mouth daily.  [provider]  Active Self  fluticasone (FLONASE) 50 MCG/ACT nasal spray [id number]  Place 1 spray into both nostrils daily. Colon Branch, MD  Active   fluticasone furoate-vilanterol (BREO ELLIPTA) 100-25 MCG/ACT AEPB [id number] No Inhale 1 puff into the lungs daily. Rinse mouth after each use.  Patient not taking: Reported on  03/15/2022   Colon Branch, MD Not Taking Active   furosemide (LASIX) 40 MG tablet [id number] Yes Take 1 tablet (40 mg total) by mouth daily. Colon Branch, MD Taking Active   hydrocortisone 2.5 % cream [id number]  Apply topically 2 (two) times daily. Colon Branch, MD  Active Self  losartan (COZAAR) 100 MG tablet [id number] Yes Take 1 tablet (100 mg total) by mouth daily. Colon Branch, MD Taking Active   Multiple Vitamin (MULTIVITAMIN) tablet [id number]  Take 1 tablet by mouth daily. [provider]  Active Self  omeprazole (PRILOSEC) 20 MG capsule [id number] Yes TAKE 1 CAPSULE (20 MG TOTAL) BY MOUTH DAILY. Colon Branch, MD Taking Active   pioglitazone (ACTOS) 45 MG tablet [id number] Yes Take 1 tablet (45 mg total) by mouth daily. Colon Branch, MD Taking Active   sildenafil (REVATIO) 20 MG tablet [id number]  TAKE 3 TO 4 TABLETS BY MOUTH AT BEDTIME AS NEEDED. Colon Branch, MD  Active   sitaGLIPtin (JANUVIA) 100 MG tablet [id number] Yes TAKE 1 TABLET (100 MG TOTAL) BY MOUTH DAILY. Colon Branch, MD Taking Active             Patient Active Problem List   Diagnosis Date Noted   Chronic stasis dermatitis 11/06/2021   Chronic dermatitis- legs 09/01/2021   Asthma 04/06/2021   Anxiety 07/07/2019   High cholesterol 01/20/2018   DJD (degenerative joint disease) 09/06/2017   Erectile dysfunction 09/06/2017   PCP NOTES >>> 01/23/2015   Subclinical hyperthyroidism 10/05/2014   Diabetes mellitus without complication ([place]) 65/06/5463   Morbid obesity ([place]) 12/03/2012   GERD (gastroesophageal reflux disease) 09/24/2012   IBS ? 04/22/2012   Polycythemia    Annual physical exam 06/05/2011   Foot drop, right 06/05/2011   HYPERTENSION, BENIGN SYSTEMIC 06/14/2006   Allergic rhinitis 06/14/2006   NEPHROLITHIASIS 06/14/2006   OSA (obstructive sleep apnea) 06/14/2006     Medication Assistance:   Application for Januvia and Breo  medication assistance program. in process.  Anticipated assistance start date  03/24/2022.  See plan of care for additional detail.   Assessment / Plan: Type 2 DM -  Mailed application for Januvia to [employer] patient assistance program.  Continue Januvia '100mg'$  daily (monitor eGR - no dose adjustment current needed) and pioglitazone '45mg'$  daily. If patient has edema - consider lowering dose of pioglitazone and consider SGLT 2 for DM ([place] or [place]) or could consider GLP1 / Mounjaro as well. He would likely qualify for medication assistance program for these options as well.  Asthma Applying for medication assistance program for  Breo inhaler. Faxed application today.    Follow Up:  Follow up in 2 to 3 weeks.    Cherre Robins, PharmD Clinical Pharmacist [place] [phone]

## 2022-03-20 ENCOUNTER — Other Ambulatory Visit: Payer: Self-pay | Admitting: Internal Medicine

## 2022-03-23 ENCOUNTER — Ambulatory Visit: Payer: Self-pay

## 2022-03-23 NOTE — Patient Outreach (Signed)
  Care Coordination   Follow Up Visit Note   03/23/2022 Name: Donald Jones MRN: [id number] DOB: May 27, 1945  Donald Jones is a 76 y.o. year old male who sees [name], Alda Berthold, MD for primary care. I spoke with  Donald Jones by phone today.  What matters to the patients health and wellness today?  Donald Jones reports his blood sugars are doing well. He reports blood sugars are ranging 120-130's. He states he will look at attending diabetes/nutrition classes at the beginning of the year. He denies any other questions or concern. Patient is agreeable to case closure and will contact RNCM if care coordination needs in the future.   Goals Addressed             This Visit's Progress    COMPLETED: Maintain and/or improve health diabetes education       Care Coordination Interventions: Reviewed scheduled upcoming appointments Encouraged to contact PCP when ready to attend more formal diabetes nutrition class . Confirmed he has contact number to DM/Nutrition education center at [place]. Reinformed target blood sugar ranges 80-130 before meals and less than 180 2 hours after meals.  Encouraged to continue to eat healthy and avoid or minimize concentrated sweets and monitor portion sizes      SDOH assessments and interventions completed:  No  Care Coordination Interventions:  Yes, provided   Follow up plan: No further intervention required.   Encounter Outcome:  Pt. Visit Completed   Thea Silversmith, RN, MSN, BSN, [place] Coordinator [phone]

## 2022-03-23 NOTE — Patient Instructions (Signed)
Visit Information  Thank you for taking time to visit with me today. Please don't hesitate to contact me if I can be of assistance to you.   Following are the goals we discussed today:   Goals Addressed             This Visit's Progress    Maintain and/or improve health diabetes education       Care Coordination Interventions: Reviewed scheduled upcoming appointments Encouraged to contact PCP when ready to attend more formal diabetes nutrition class . Confirmed he has contact number to DM/Nutrition education center at [place]. Reinformed target blood sugar ranges 80-130 before meals and less than 180 2 hours after meals.  Encouraged to continue to eat healthy and avoid or minimize concentrated sweets and monitor portion sizes      If you are experiencing a Mental Health or [address] or need someone to talk to, please call the Suicide and Crisis Lifeline: 988  Patient verbalizes understanding of instructions and care plan provided today and agrees to view in [place]. Active MyChart status and patient understanding of how to access instructions and care plan via MyChart confirmed with patient.     Thea Silversmith, RN, MSN, BSN, [place] Coordinator [phone]

## 2022-03-27 ENCOUNTER — Ambulatory Visit (INDEPENDENT_AMBULATORY_CARE_PROVIDER_SITE_OTHER): Payer: Medicare Other | Admitting: Pharmacist

## 2022-03-27 DIAGNOSIS — J4520 Mild intermittent asthma, uncomplicated: Secondary | ICD-10-CM

## 2022-03-27 DIAGNOSIS — E119 Type 2 diabetes mellitus without complications: Secondary | ICD-10-CM

## 2022-03-27 NOTE — Progress Notes (Signed)
Pharmacy Note  03/27/2022 Name: Donald Jones MRN: [id number] DOB: 1946-03-12  Subjective: Donald Jones is a 76 y.o. year old male who is a primary care patient of Colon Branch, MD. Clinical Pharmacist Practitioner referral was placed to assist with medication management and assistance with medication costs.   Engaged with patient by telephone for initial visit today.  Patient is in Medicare coverage gap and cost of Januvia is $140 per 30 days and Symbicort $125 per 30 days. With approval of PCP Symbicort was changed to [facility]. Applied for medication assistance program for Januvia with [employer] - mailed 03/15/2022 and for [facility] with [place].  Patient has not heard from either program yet.   Objective: Review of patient status, including review of consultants reports, laboratory and other test data, was performed as part of comprehensive evaluation and provision of chronic care management services.   Lab Results  Component Value Date   CREATININE 1.40 02/03/2022   CREATININE 1.40 09/29/2021   CREATININE 1.44 09/15/2021    Lab Results  Component Value Date   HGBA1C 7.2 (H) 02/03/2022       Component Value Date/Time   CHOL 108 07/06/2021 1445   TRIG 38.0 07/06/2021 1445   HDL 52.00 07/06/2021 1445   CHOLHDL 2 07/06/2021 1445   VLDL 7.6 07/06/2021 1445   LDLCALC 49 07/06/2021 1445   LDLCALC 49 08/06/2020 1406     Clinical ASCVD: Type 2 DM The ASCVD Risk score (Arnett DK, et al., 2019) failed to calculate for the following reasons:   The valid total cholesterol range is 130 to 320 mg/dL    BP Readings from Last 3 Encounters:  02/03/22 120/68  12/14/21 120/80  11/04/21 132/84     Allergies  Allergen Reactions   Metformin And Related     Intolerant, GI symptoms   Sulfa Antibiotics Nausea And Vomiting    Medications Reviewed Today     Reviewed by Cherre Robins, RPH-CPP (Pharmacist) on 03/27/22 at 77  Med List Status: <None>   Medication Order Taking? Sig  Documenting Provider Last Dose Status Informant  albuterol (VENTOLIN HFA) 108 (90 Base) MCG/ACT inhaler [id number] Yes Inhale 2 puffs into the lungs every 6 (six) hours as needed for wheezing or shortness of breath. Colon Branch, MD Taking Active   AMBULATORY Baruch Gouty MEDICATION [id number]  GI Cocktail - 90 mls of 2% Lidocaine                      90 mls Dicyclomine '10mg'$ /42m                    270 mls Maalox Take 5-10 ml every 4-6 hours as needed  Patient not taking: Reported on 02/16/2022   Barrett, [name] PA-C  Active            Med Note (Antony Contras Jodeci Roarty B   Thu Feb 16, 2022  4:36 PM) Hasn't used recently   aspirin EC 81 MG tablet [id number] Take 81 mg by mouth daily. Each morning. Swallow whole. [provider] Taking Active   atorvastatin (LIPITOR) 40 MG tablet [id number] TAKE 1 TABLET BY MOUTH AT BEDTIME. PColon Branch MD Taking Active   Caraway Oil-Levomenthol (FDGARD) 25-20.75 MG CAPS [id number] Take 2 tablets by mouth in the morning and at bedtime.  [provider] Taking Active Self  diclofenac sodium (VOLTAREN) 1 % GEL [id number] Apply 4 g topically 3 (three) times  daily as needed. Colon Branch, MD Taking Active Self  dicyclomine (BENTYL) 20 MG tablet [id number]  TAKE 1 TABLET BY MOUTH 3 TIMES DAILY BEFORE MEALS. Colon Branch, MD  Active   escitalopram (LEXAPRO) 10 MG tablet [id number] Yes Take 1 tablet (10 mg total) by mouth daily. Colon Branch, MD Taking Active   famotidine (PEPCID) 20 MG tablet [id number] Yes TAKE 1 TABLET BY MOUTH 2 TIMES DAILY BEFORE A MEAL. Colon Branch, MD Taking Active   Flaxseed, Linseed, (FLAXSEED OIL) 1000 MG CAPS [id number] Yes Take 2,000 mg by mouth daily.  [provider] Taking Active Self  fluticasone (FLONASE) 50 MCG/ACT nasal spray [id number] Yes Place 1 spray into both nostrils daily. Colon Branch, MD Taking Active   fluticasone furoate-vilanterol (BREO ELLIPTA) 100-25 MCG/ACT AEPB [id number] Yes Inhale 1 puff into the  lungs daily. Rinse mouth after each use. Colon Branch, MD Taking Active   furosemide (LASIX) 40 MG tablet [id number] Yes Take 1 tablet (40 mg total) by mouth daily. Colon Branch, MD Taking Active   hydrocortisone 2.5 % cream [id number]  Apply topically 2 (two) times daily. Colon Branch, MD  Active Self  losartan (COZAAR) 100 MG tablet [id number] Yes TAKE 1 TABLET BY MOUTH DAILY. Kennyth Arnold, FNP Taking Active   Multiple Vitamin (MULTIVITAMIN) tablet [id number] Yes Take 1 tablet by mouth daily. [provider] Taking Active Self  omeprazole (PRILOSEC) 20 MG capsule [id number] Yes TAKE 1 CAPSULE (20 MG TOTAL) BY MOUTH DAILY. Colon Branch, MD Taking Active   pioglitazone (ACTOS) 45 MG tablet [id number] Yes Take 1 tablet (45 mg total) by mouth daily. Colon Branch, MD Taking Active   sildenafil (REVATIO) 20 MG tablet [id number] Yes TAKE 3 TO 4 TABLETS BY MOUTH AT BEDTIME AS NEEDED. Colon Branch, MD Taking Active   sitaGLIPtin (JANUVIA) 100 MG tablet [id number] Yes TAKE 1 TABLET (100 MG TOTAL) BY MOUTH DAILY. Colon Branch, MD Taking Active             Patient Active Problem List   Diagnosis Date Noted   Chronic stasis dermatitis 11/06/2021   Chronic dermatitis- legs 09/01/2021   Asthma 04/06/2021   Anxiety 07/07/2019   High cholesterol 01/20/2018   DJD (degenerative joint disease) 09/06/2017   Erectile dysfunction 09/06/2017   PCP NOTES >>> 01/23/2015   Subclinical hyperthyroidism 10/05/2014   Diabetes mellitus without complication ([place]) 25/08/3974   Morbid obesity ([place]) 12/03/2012   GERD (gastroesophageal reflux disease) 09/24/2012   IBS ? 04/22/2012   Polycythemia    Annual physical exam 06/05/2011   Foot drop, right 06/05/2011   HYPERTENSION, BENIGN SYSTEMIC 06/14/2006   Allergic rhinitis 06/14/2006   NEPHROLITHIASIS 06/14/2006   OSA (obstructive sleep apnea) 06/14/2006     Medication Assistance:   Application for Januvia and Breo  medication assistance program. in process.   Anticipated assistance start date 03/24/2022.  See plan of care for additional detail.   Assessment / Plan: Type 2 DM -  Called [employer] patient assistance program - they  have not received application yet. They recommended rechecking in 2 to 3 days.  Continue Januvia '100mg'$  daily (monitor eGR - no dose adjustment current needed) and pioglitazone '45mg'$  daily. If patient has edema - consider lowering dose of pioglitazone and consider SGLT 2 for DM ([place] or [place]) or could consider GLP1 / Mounjaro as well. He would likely qualify for medication assistance program for these options as well.  Asthma Applying  for medication assistance program for [facility] inhaler. Called [employer] - they are processing new information and should make approval decision tomorrow 03/28/2022  Follow Up:  Follow up in 1 to 2 weeks.    Cherre Robins, PharmD Clinical Pharmacist [place] [phone]

## 2022-04-03 ENCOUNTER — Telehealth: Payer: Self-pay | Admitting: Pharmacist

## 2022-04-03 ENCOUNTER — Telehealth: Payer: Medicare Other

## 2022-04-03 NOTE — Telephone Encounter (Signed)
Spoke with [place] representative. Patient has been approved to receive Breo thru 04/16/2022. He will received 1  shipment of 3 inhalers = 90 days.  Expected date of delivery 12/19//2023 thru 04/07/2022. RX # [id number].  LM on VM to notify patient.

## 2022-04-04 NOTE — Telephone Encounter (Signed)
Patient's wife reports that they received shipment of Breo with 3 inhalers yesterday 04/03/2022.  She also received an email from [facility] that they received his application and are working on it.   I am not sure if he will be approved for Januvia before the end of 2023. Mrs. Mcbrien reports he will need filled next week.  Recommended they ask pharmacy to only fill 1 week supply to get thru 2023. His Medicare benefits should restart Apr 17 2022 and copay should be $45 to $47 again.   I will also touch base with them in April or May 2024 to see if he has met $600 out of pocket to reapply for [place] [employer] [place] patient assistance program.

## 2022-04-06 ENCOUNTER — Other Ambulatory Visit: Payer: Self-pay | Admitting: Internal Medicine

## 2022-04-11 ENCOUNTER — Other Ambulatory Visit: Payer: Self-pay | Admitting: Internal Medicine

## 2022-04-19 ENCOUNTER — Telehealth: Payer: Self-pay | Admitting: Pharmacist

## 2022-04-19 NOTE — Telephone Encounter (Signed)
Patient received a package from [facility] for a blood test. Patient is not sure what the test is for. Patient is concerned because blood sample is showing that it will be shipped to [place] and he has worried about mailing it that far.  Upon further questioning it is an A1c test sent by [facility].  Instructed patient to follow instructions provided and make sure to send back the same day.

## 2022-04-25 DIAGNOSIS — G4733 Obstructive sleep apnea (adult) (pediatric): Secondary | ICD-10-CM | POA: Diagnosis not present

## 2022-05-09 ENCOUNTER — Other Ambulatory Visit: Payer: Self-pay

## 2022-05-09 MED ORDER — SITAGLIPTIN PHOSPHATE 100 MG PO TABS: 100.0000 mg | ORAL_TABLET | Freq: Every day | ORAL | 1 refills | Status: DC

## 2022-05-15 ENCOUNTER — Ambulatory Visit (INDEPENDENT_AMBULATORY_CARE_PROVIDER_SITE_OTHER): Payer: Medicare Other | Admitting: Medical

## 2022-05-15 VITALS — BP 140/80 | HR 62 | Temp 98.0°F | Resp 18 | Ht 65.0 in | Wt 258.0 lb

## 2022-05-15 DIAGNOSIS — H9201 Otalgia, right ear: Secondary | ICD-10-CM | POA: Diagnosis not present

## 2022-05-15 DIAGNOSIS — I10 Essential (primary) hypertension: Secondary | ICD-10-CM

## 2022-05-15 DIAGNOSIS — R42 Dizziness and giddiness: Secondary | ICD-10-CM | POA: Diagnosis not present

## 2022-05-15 DIAGNOSIS — J3489 Other specified disorders of nose and nasal sinuses: Secondary | ICD-10-CM | POA: Diagnosis not present

## 2022-05-15 DIAGNOSIS — J329 Chronic sinusitis, unspecified: Secondary | ICD-10-CM | POA: Diagnosis not present

## 2022-05-15 MED ORDER — AMOXICILLIN-POT CLAVULANATE 875-125 MG PO TABS: 1.0000 | ORAL_TABLET | Freq: Two times a day (BID) | ORAL | 0 refills | Status: DC

## 2022-05-15 MED ORDER — MECLIZINE HCL 12.5 MG PO TABS: 12.5000 mg | ORAL_TABLET | Freq: Three times a day (TID) | ORAL | 0 refills | Status: DC | PRN

## 2022-05-15 MED ORDER — AMLODIPINE BESYLATE 2.5 MG PO TABS: 2.5000 mg | ORAL_TABLET | Freq: Every day | ORAL | 3 refills | Status: DC

## 2022-05-15 NOTE — Patient Instructions (Addendum)
Sinus pressure/infection for 2 months and rt ear pain recently. Rx augmentin 875 mg twice daily for 10 days.  Daily minimal epistaxis. May I also think related to cpap. If nose bleeding continue update your pulmonologist. Presently can use humidifier in your room and apply Vaseline to anterior tip of nose/kessel baux plexus.  You do appear to have vertigo. Overall normal neurologic exam except brief vertigo on position change. Then subsides within 30 seconds. If prolonged vertigo more than 5-10 minute can start meclizine. Rx sent. If vertigo constant worsening let me know would refer to PT. If vertigo were to occur with and motor/sensory deficits then be seen in the ED.  Htn- bp is high and you report 824-175 systolic readings. Contnue losartan and lasix. Will add low dose amlodipine 2.5 mg daily.  Follow up 10 days or sooner if needed.

## 2022-05-15 NOTE — Progress Notes (Signed)
Subjective:    Patient ID: Donald Jones, male    DOB: Sep 09, 1945, 77 y.o.   MRN: [id number]  HPI  Pt in with some sinus pressure and nasal congestion for 2 months. He note he uses cpap. He states some daily sinus pressure frontal and maxillary pressure. Some rt ear pain 3 days ago.   When he blows his nose will get will see some blood in clear mucus. Pt sees pulmonologist.   Yesterday 2 times when getting up from seated felt light headed/vertigo for 30 seconds. Today less so  but some on exam very brief on sitting to lying supine. Then again on sitting from supine position.  Note rt ear pain came on with dizziness.    Review of Systems  Constitutional:  Negative for chills, diaphoresis and fatigue.  HENT:  Positive for congestion, sinus pressure and sinus pain.   Respiratory:  Negative for cough, chest tightness and shortness of breath.   Cardiovascular:  Negative for chest pain and palpitations.  Gastrointestinal:  Negative for abdominal pain.  Genitourinary:  Negative for dysuria.  Musculoskeletal:  Negative for back pain and joint swelling.  Neurological:  Positive for dizziness. Negative for seizures, syncope, weakness and light-headedness.       See hpi.   Hematological:  Negative for adenopathy. Does not bruise/bleed easily.   Past Medical History:  Diagnosis Date   Anxiety    Arthritis    hands right   Diabetes mellitus without complication (HCC)    GERD (gastroesophageal reflux disease)    Hypertension    Kidney stones    several episodes.  Last one in 2011.     Obesity    100-lb weight loss since 2011.    Polycythemia    Sleep apnea    on Cpap     Social History   Socioeconomic History   Marital status: Married    Spouse name: Not on file   Number of children: Not on file   Years of education: Not on file   Highest education level: Not on file  Occupational History   Occupation: RETIREd ---[employer], 2021  Tobacco Use   Smoking status: Never    Smokeless tobacco: Never  Vaping Use   Vaping Use: Never used  Substance and Sexual Activity   Alcohol use: No    Comment: none for 15 years   Drug use: No   Sexual activity: Not on file  Other Topics Concern   Not on file  Social History Narrative   1ST WIFE DIED FROM LUNG CANCER, 2ND WIFE DIED FROM RENAL CANCER.     Married x 4 , lives w/ wife   3 sisters and 3 brother : lost 2 sisters    Social Determinants of Health   Financial Resource Strain: Medium Risk (02/16/2022)   Overall Financial Resource Strain (CARDIA)    Difficulty of Paying Living Expenses: Somewhat hard  Food Insecurity: No Food Insecurity (02/16/2022)   Hunger Vital Sign    Worried About Running Out of Food in the Last Year: Never true    [place] in the Last Year: Never true  Transportation Needs: No Transportation Needs (02/16/2022)   PRAPARE - Hydrologist (Medical): No    Lack of Transportation (Non-Medical): No  Physical Activity: Unknown (09/06/2021)   Exercise Vital Sign    Days of Exercise per Week: 0 days    Minutes of Exercise per Session: Not on file  Stress:  Stress Concern Present (03/02/2022)   [place]    Feeling of Stress : To some extent  Social Connections: Moderately Integrated (09/06/2021)   Social Connection and Isolation Panel [NHANES]    Frequency of Communication with Friends and Family: More than three times a week    Frequency of Social Gatherings with Friends and Family: More than three times a week    Attends Religious Services: More than 4 times per year    Active Member of [employer] or Organizations: No    Attends Archivist Meetings: Never    Marital Status: Married  Human resources officer Violence: Not At Risk (09/06/2021)   Humiliation, Afraid, Rape, and Kick questionnaire    Fear of Current or Ex-Partner: No    Emotionally Abused: No    Physically Abused: No    Sexually  Abused: No    Past Surgical History:  Procedure Laterality Date   CHOLECYSTECTOMY     HAND SURGERY  2010   R hand , d/t a injury, 3 surgeries     Family History  Problem Relation Age of Onset   Diabetes Mother    Coronary artery disease Mother 3   Stroke Father        M and F   Hypertension Father    Heart disease Sister        ?   Alcohol abuse Sister    Colon cancer Sister 68       age ~ 49   Prostate cancer Neg Hx    Stomach cancer Neg Hx    Rectal cancer Neg Hx    Esophageal cancer Neg Hx     Allergies  Allergen Reactions   Metformin And Related     Intolerant, GI symptoms   Sulfa Antibiotics Nausea And Vomiting    Current Outpatient Medications on File Prior to Visit  Medication Sig Dispense Refill   albuterol (VENTOLIN HFA) 108 (90 Base) MCG/ACT inhaler Inhale 2 puffs into the lungs every 6 (six) hours as needed for wheezing or shortness of breath. 18 g 5   AMBULATORY NON FORMULARY MEDICATION GI Cocktail - 90 mls of 2% Lidocaine                      90 mls Dicyclomine '10mg'$ /7m                    270 mls Maalox Take 5-10 ml every 4-6 hours as needed 120 mL 0   aspirin EC 81 MG tablet Take 81 mg by mouth daily. Each morning. Swallow whole.     atorvastatin (LIPITOR) 40 MG tablet TAKE 1 TABLET BY MOUTH AT BEDTIME. 90 tablet 1   Caraway Oil-Levomenthol (FDGARD) 25-20.75 MG CAPS Take 2 tablets by mouth in the morning and at bedtime.      diclofenac sodium (VOLTAREN) 1 % GEL Apply 4 g topically 3 (three) times daily as needed. 100 g 3   dicyclomine (BENTYL) 20 MG tablet Take 1 tablet (20 mg total) by mouth 3 (three) times daily before meals. 270 tablet 1   escitalopram (LEXAPRO) 10 MG tablet Take 1 tablet (10 mg total) by mouth daily. 90 tablet 1   famotidine (PEPCID) 20 MG tablet Take 1 tablet (20 mg total) by mouth 2 (two) times daily. 180 tablet 1   Flaxseed, Linseed, (FLAXSEED OIL) 1000 MG CAPS Take 2,000 mg by mouth daily.      fluticasone (FLONASE)  50 MCG/ACT  nasal spray Place 1 spray into both nostrils daily. 16 g 12   fluticasone furoate-vilanterol (BREO ELLIPTA) 100-25 MCG/ACT AEPB Inhale 1 puff into the lungs daily. Rinse mouth after each use. 180 each 3   furosemide (LASIX) 40 MG tablet Take 1 tablet (40 mg total) by mouth daily. 90 tablet 1   hydrocortisone 2.5 % cream Apply topically 2 (two) times daily. 60 g 1   losartan (COZAAR) 100 MG tablet TAKE 1 TABLET BY MOUTH DAILY. 90 tablet 1   Multiple Vitamin (MULTIVITAMIN) tablet Take 1 tablet by mouth daily.     omeprazole (PRILOSEC) 20 MG capsule TAKE 1 CAPSULE (20 MG TOTAL) BY MOUTH DAILY. 90 capsule 1   pioglitazone (ACTOS) 45 MG tablet Take 1 tablet (45 mg total) by mouth daily. 90 tablet 1   sildenafil (REVATIO) 20 MG tablet TAKE 3 TO 4 TABLETS BY MOUTH AT BEDTIME AS NEEDED. 30 tablet 3   sitaGLIPtin (JANUVIA) 100 MG tablet Take 1 tablet (100 mg total) by mouth daily. 90 tablet 1   No current facility-administered medications on file prior to visit.    BP (!) 140/80 Comment: 2nd check left arm  Pulse 62   Temp 98 F (36.7 C)   Resp 18   Ht '5\' 5"'$  (1.651 m)   Wt 258 lb (117 kg)   SpO2 93%   BMI 42.93 kg/m         Objective:   Physical Exam  General Mental Status- Alert. General Appearance- Not in acute distress.   Skin General: Color- Normal Color. Moisture- Normal Moisture.  Neck Carotid Arteries- Normal color. Moisture- Normal Moisture. No carotid bruits. No JVD.  Chest and Lung Exam Auscultation: Breath Sounds:-Normal.  Cardiovascular Auscultation:Rythm- Regular. Murmurs & Other Heart Sounds:Auscultation of the heart reveals- No Murmurs.  Abdomen Inspection:-Inspeection Normal. Palpation/Percussion:Note:No mass. Palpation and Percussion of the abdomen reveal- Non Tender, Non Distended + BS, no rebound or guarding.    Neurologic Cranial Nerve exam:- CN III-XII intact(No nystagmus), symmetric smile. Strength:- 5/5 equal and symmetric strength both upper and  lower extremities.       Assessment & Plan:   Patient Instructions  Sinus pressure/infection for 2 months and rt ear pain recently. Rx augmentin 875 mg twice daily for 10 days.  Daily minimal epistaxis. May I also think related to cpap. If nose bleeding continue update your pulmonologist. Presently can use humidifier in your room and apply Vaseline to anterior tip of nose/kessel baux plexus.  You do appear to have vertigo. Overall normal neurologic exam except brief vertigo on position change. Then subsides within 30 seconds. If prolonged vertigo more than 5-10 minute can start meclizine. Rx sent. If vertigo constant worsening let me know would refer to PT. If vertigo were to occur with and motor/sensory deficits then be seen in the ED.  Htn- bp is high and you report 150-569 systolic readings. Contnue losartan and lasix. Will add low dose amlodipine 2.5 mg daily.  Follow up 10 days or sooner if needed.      Mackie Pai, PA-C   Time spent with patient today was 32  minutes which consisted of chart review, discussing diagnosis,  treatment and documentation.

## 2022-05-30 ENCOUNTER — Encounter: Payer: Self-pay | Admitting: Adult Health

## 2022-05-30 ENCOUNTER — Ambulatory Visit: Payer: Medicare Other | Admitting: Adult Health

## 2022-05-30 VITALS — BP 118/60 | HR 86 | Temp 97.9°F | Ht 65.0 in | Wt 256.8 lb

## 2022-05-30 DIAGNOSIS — G4733 Obstructive sleep apnea (adult) (pediatric): Secondary | ICD-10-CM

## 2022-05-30 DIAGNOSIS — J4520 Mild intermittent asthma, uncomplicated: Secondary | ICD-10-CM

## 2022-05-30 NOTE — Progress Notes (Signed)
$@Patientn$  ID: Donald Jones, male    DOB: 15-Aug-1945, 77 y.o.   MRN: [id number]  Chief Complaint  Patient presents with   Follow-up    Referring provider: Colon Branch, MD  HPI: 77 year old male followed for obstructive sleep apnea (diagnosed in 2003) on nocturnal CPAP Medical history significant for hypertension, diabetes, hyperlipidemia and asthma   TEST/EVENTS :   05/30/2022 Follow up : OSA  Patient returns for 1 year follow-up.  Patient has underlying sleep apnea and is on nocturnal CPAP.  Patient says he has been wearing his CPAP just about every single night.  Feels that it is working well.  Has decreased daytime sleepiness.  CPAP download shows 87% compliance.  Daily average usage at 7.5 hours.  Patient is on auto CPAP 8 to 15 cm H2O.  AHI is improved from last year currently at 11.7/hour.  Patient feels that his pressures are good and he feels that he sleeps well through the night.  (1.9/hour central events.  Average daily pressure at 14 cm H2O). Patient does complain of some nasal dryness and mixed blood in his nasal secretions.   Allergies  Allergen Reactions   Metformin And Related     Intolerant, GI symptoms   Sulfa Antibiotics Nausea And Vomiting    Immunization History  Administered Date(s) Administered   Fluad Quad(high Dose 65+) 01/10/2019, 01/23/2020, 02/03/2022   Influenza Split 01/17/2011, 01/16/2012   Influenza Whole 02/18/2007   Influenza, High Dose Seasonal PF 01/22/2015, 02/03/2016, 01/29/2017, 01/09/2018   Influenza,inj,Quad PF,6+ Mos 02/05/2013, 01/27/2014, 03/28/2019   Influenza-Unspecified 01/15/2021   PFIZER Comirnaty(Gray Top)Covid-19 Tri-Sucrose Vaccine 09/24/2020   PFIZER(Purple Top)SARS-COV-2 Vaccination 04/12/2019, 06/03/2019, 04/07/2020   PNEUMOCOCCAL CONJUGATE-20 02/03/2022   Pneumococcal Conjugate-13 01/22/2015   Pneumococcal Polysaccharide-23 01/16/2003, 12/03/2012   Respiratory Syncytial Virus Vaccine,Recomb Aduvanted(Arexvy) 02/06/2022    Tdap 12/03/2012, 05/22/2016   Zoster Recombinat (Shingrix) 01/29/2018, 05/23/2018   Zoster, Live 02/09/2010    Past Medical History:  Diagnosis Date   Anxiety    Arthritis    hands right   Diabetes mellitus without complication (HCC)    GERD (gastroesophageal reflux disease)    Hypertension    Kidney stones    several episodes.  Last one in 2011.     Obesity    100-lb weight loss since 2011.    Polycythemia    Sleep apnea    on Cpap    Tobacco History: Social History   Tobacco Use  Smoking Status Never  Smokeless Tobacco Never   Counseling given: Not Answered   Outpatient Medications Prior to Visit  Medication Sig Dispense Refill   albuterol (VENTOLIN HFA) 108 (90 Base) MCG/ACT inhaler Inhale 2 puffs into the lungs every 6 (six) hours as needed for wheezing or shortness of breath. 18 g 5   AMBULATORY NON FORMULARY MEDICATION GI Cocktail - 90 mls of 2% Lidocaine                      90 mls Dicyclomine 52m/5ml                    270 mls Maalox Take 5-10 ml every 4-6 hours as needed 120 mL 0   amLODipine (NORVASC) 2.5 MG tablet Take 1 tablet (2.5 mg total) by mouth daily. 30 tablet 3   AREXVY 120 MCG/0.5ML injection Inject 0.5 mLs into the muscle once.     aspirin EC 81 MG tablet Take 81 mg by mouth daily. Each morning. Swallow  whole.     atorvastatin (LIPITOR) 40 MG tablet TAKE 1 TABLET BY MOUTH AT BEDTIME. 90 tablet 1   Caraway Oil-Levomenthol (FDGARD) 25-20.75 MG CAPS Take 2 tablets by mouth in the morning and at bedtime.      diclofenac sodium (VOLTAREN) 1 % GEL Apply 4 g topically 3 (three) times daily as needed. 100 g 3   dicyclomine (BENTYL) 20 MG tablet Take 1 tablet (20 mg total) by mouth 3 (three) times daily before meals. 270 tablet 1   escitalopram (LEXAPRO) 10 MG tablet Take 1 tablet (10 mg total) by mouth daily. 90 tablet 1   famotidine (PEPCID) 20 MG tablet Take 1 tablet (20 mg total) by mouth 2 (two) times daily. 180 tablet 1   Flaxseed, Linseed,  (FLAXSEED OIL) 1000 MG CAPS Take 2,000 mg by mouth daily.      fluticasone (FLONASE) 50 MCG/ACT nasal spray Place 1 spray into both nostrils daily. 16 g 12   fluticasone furoate-vilanterol (BREO ELLIPTA) 100-25 MCG/ACT AEPB Inhale 1 puff into the lungs daily. Rinse mouth after each use. 180 each 3   furosemide (LASIX) 40 MG tablet Take 1 tablet (40 mg total) by mouth daily. 90 tablet 1   hydrocortisone 2.5 % cream Apply topically 2 (two) times daily. 60 g 1   losartan (COZAAR) 100 MG tablet TAKE 1 TABLET BY MOUTH DAILY. 90 tablet 1   meclizine (ANTIVERT) 12.5 MG tablet Take 1 tablet (12.5 mg total) by mouth 3 (three) times daily as needed for dizziness. 30 tablet 0   Multiple Vitamin (MULTIVITAMIN) tablet Take 1 tablet by mouth daily.     omeprazole (PRILOSEC) 20 MG capsule TAKE 1 CAPSULE (20 MG TOTAL) BY MOUTH DAILY. 90 capsule 1   pioglitazone (ACTOS) 45 MG tablet Take 1 tablet (45 mg total) by mouth daily. 90 tablet 1   sildenafil (REVATIO) 20 MG tablet TAKE 3 TO 4 TABLETS BY MOUTH AT BEDTIME AS NEEDED. 30 tablet 3   sitaGLIPtin (JANUVIA) 100 MG tablet Take 1 tablet (100 mg total) by mouth daily. 90 tablet 1   amoxicillin-clavulanate (AUGMENTIN) 875-125 MG tablet Take 1 tablet by mouth 2 (two) times daily. (Patient not taking: Reported on 05/30/2022) 20 tablet 0   No facility-administered medications prior to visit.     Review of Systems:   Constitutional:   No  weight loss, night sweats,  Fevers, chills, fatigue, or  lassitude.  HEENT:   No headaches,  Difficulty swallowing,  Tooth/dental problems, or  Sore throat,                No sneezing, itching, ear ache,  +nasal congestion, post nasal drip,   CV:  No chest pain,  Orthopnea, PND, swelling in lower extremities, anasarca, dizziness, palpitations, syncope.   GI  No heartburn, indigestion, abdominal pain, nausea, vomiting, diarrhea, change in bowel habits, loss of appetite, bloody stools.   Resp: No shortness of breath with  exertion or at rest.  No excess mucus, no productive cough,  No non-productive cough,  No coughing up of blood.  No change in color of mucus.  No wheezing.  No chest wall deformity  Skin: no rash or lesions.  GU: no dysuria, change in color of urine, no urgency or frequency.  No flank pain, no hematuria   MS:  No joint pain or swelling.  No decreased range of motion.  No back pain.    Physical Exam  BP 118/60 (BP Location: Left Arm, Patient Position: Sitting,  Cuff Size: Large)   Pulse 86   Temp 97.9 F (36.6 C) (Oral)   Ht 5' 5"$  (1.651 m)   Wt 256 lb 12.8 oz (116.5 kg)   SpO2 93%   BMI 42.73 kg/m   GEN: A/Ox3; pleasant , NAD, well nourished    HEENT:  [place]/AT,  EACs-clear, TMs-wnl, NOSE-clear, THROAT-clear, no lesions, no postnasal drip or exudate noted.   NECK:  Supple w/ fair ROM; no JVD; normal carotid impulses w/o bruits; no thyromegaly or nodules palpated; no lymphadenopathy.    RESP  Clear  P & A; w/o, wheezes/ rales/ or rhonchi. no accessory muscle use, no dullness to percussion  CARD:  RRR, no m/r/g, no peripheral edema, pulses intact, no cyanosis or clubbing.  GI:   Soft & nt; nml bowel sounds; no organomegaly or masses detected.   Musco: Warm bil, no deformities or joint swelling noted.   Neuro: alert, no focal deficits noted.    Skin: Warm, no lesions or rashes    Lab Results:  CBC    BNP No results found for: "BNP"  ProBNP   Imaging: No results found.        No data to display          No results found for: "NITRICOXIDE"      Assessment & Plan:   OSA (obstructive sleep apnea) Improved compliance on CPAP.  Patient continues to have some residual events but feels overall that he is benefiting from CPAP and is sleeping well.  Will continue all current settings.  No changes at this time.  If starts to have increased symptom burden can consider a CPAP titration study.  In the past  had to decrease pressure due to mask leak and  comfort.  Plan  Patient Instructions  Continue on CPAP At bedtime   , Keep up good work.  Work on healthy weight loss.  Saline nasal spray Twice daily   Saline nasal gel Twice daily  .  Follow up with Dr. Elsworth Soho  In 1 year and As needed        [name], NP 05/30/2022

## 2022-05-30 NOTE — Patient Instructions (Signed)
Continue on CPAP At bedtime   , Keep up good work.  Work on healthy weight loss.  Saline nasal spray Twice daily   Saline nasal gel Twice daily  .  Follow up with Dr. Elsworth Soho  In 1 year and As needed

## 2022-05-30 NOTE — Assessment & Plan Note (Deleted)
Improved compliance on CPAP.  Patient continues to have some residual events but feels overall that he is benefiting from CPAP and is sleeping well.  Will continue all current settings.  No changes at this time.  If starts to have increased symptom burden can consider a CPAP titration study.  In the past  had to decrease pressure due to mask leak and comfort.  Plan  Patient Instructions  Continue on CPAP At bedtime   , Keep up good work.  Work on healthy weight loss.  Saline nasal spray Twice daily   Saline nasal gel Twice daily  .  Follow up with Dr. Elsworth Soho  In 1 year and As needed

## 2022-05-30 NOTE — Assessment & Plan Note (Signed)
Improved compliance on CPAP.  Patient continues to have some residual events but feels overall that he is benefiting from CPAP and is sleeping well.  Will continue all current settings.  No changes at this time.  If starts to have increased symptom burden can consider a CPAP titration study.  In the past  had to decrease pressure due to mask leak and comfort.  Plan  Patient Instructions  Continue on CPAP At bedtime   , Keep up good work.  Work on healthy weight loss.  Saline nasal spray Twice daily   Saline nasal gel Twice daily  .  Follow up with Dr. Elsworth Soho  In 1 year and As needed

## 2022-06-05 ENCOUNTER — Other Ambulatory Visit: Payer: Self-pay | Admitting: Internal Medicine

## 2022-06-06 ENCOUNTER — Encounter: Payer: Self-pay | Admitting: Internal Medicine

## 2022-06-06 ENCOUNTER — Ambulatory Visit (INDEPENDENT_AMBULATORY_CARE_PROVIDER_SITE_OTHER): Payer: Medicare Other | Admitting: Internal Medicine

## 2022-06-06 VITALS — BP 136/66 | HR 66 | Temp 97.8°F | Resp 16 | Ht 65.0 in | Wt 257.5 lb

## 2022-06-06 DIAGNOSIS — E119 Type 2 diabetes mellitus without complications: Secondary | ICD-10-CM | POA: Diagnosis not present

## 2022-06-06 DIAGNOSIS — E7800 Pure hypercholesterolemia, unspecified: Secondary | ICD-10-CM

## 2022-06-06 DIAGNOSIS — E0590 Thyrotoxicosis, unspecified without thyrotoxic crisis or storm: Secondary | ICD-10-CM

## 2022-06-06 DIAGNOSIS — I10 Essential (primary) hypertension: Secondary | ICD-10-CM | POA: Diagnosis not present

## 2022-06-06 LAB — BASIC METABOLIC PANEL
BUN: 23 mg/dL (ref 6–23)
CO2: 29 mEq/L (ref 19–32)
Calcium: 9.9 mg/dL (ref 8.4–10.5)
Chloride: 101 mEq/L (ref 96–112)
Creatinine, Ser: 1.41 mg/dL (ref 0.40–1.50)
GFR: 49.09 mL/min — ABNORMAL LOW (ref >60.00)
Glucose, Bld: 156 mg/dL — ABNORMAL HIGH (ref 70–99)
Potassium: 4.3 mEq/L (ref 3.5–5.1)
Sodium: 138 mEq/L (ref 135–145)

## 2022-06-06 LAB — MICROALBUMIN / CREATININE URINE RATIO
Creatinine,U: 56.1 mg/dL
Microalb Creat Ratio: 1.4 mg/g (ref 0.0–30.0)
Microalb, Ur: 0.8 mg/dL (ref 0.0–1.9)

## 2022-06-06 LAB — LIPID PANEL
Cholesterol: 103 mg/dL (ref 0–200)
HDL: 48.3 mg/dL (ref >39.00)
LDL Cholesterol: 45 mg/dL (ref 0–99)
NonHDL: 54.75
Total CHOL/HDL Ratio: 2
Triglycerides: 47 mg/dL (ref 0.0–149.0)
VLDL: 9.4 mg/dL (ref 0.0–40.0)

## 2022-06-06 LAB — T3, FREE: T3, Free: 2.6 pg/mL (ref 2.3–4.2)

## 2022-06-06 LAB — TSH: TSH: 0.34 u[IU]/mL — ABNORMAL LOW (ref 0.35–5.50)

## 2022-06-06 LAB — T4, FREE: Free T4: 0.89 ng/dL (ref 0.60–1.60)

## 2022-06-06 LAB — HEMOGLOBIN A1C: Hgb A1c MFr Bld: 7 % — ABNORMAL HIGH (ref 4.6–6.5)

## 2022-06-06 NOTE — Progress Notes (Signed)
Subjective:    Patient ID: Donald Jones, male    DOB: 20-Oct-1945, 77 y.o.   MRN: [id number]  DOS:  06/06/2022 Type of visit - description: f/u  Today we addressed his chronic medical problems.  Also, was seen by our physician assistant few weeks ago, had apparently a sinus infection, is better.  She also had some vertigo, reports that current symptoms are short-lived dizziness when he moves quickly on when he lies down in bed and get up. Denies headache, diplopia, slurred speech or motor deficit.  Also still has some nasal discharge in the mornings described as mucus mixed with dry but fresh appearing blood    Wt Readings from Last 3 Encounters:  06/06/22 257 lb 8 oz (116.8 kg)  05/30/22 256 lb 12.8 oz (116.5 kg)  05/15/22 258 lb (117 kg)    Review of Systems See above   Past Medical History:  Diagnosis Date   Anxiety    Arthritis    hands right   Diabetes mellitus without complication (HCC)    GERD (gastroesophageal reflux disease)    Hypertension    Kidney stones    several episodes.  Last one in 2011.     Obesity    100-lb weight loss since 2011.    Polycythemia    Sleep apnea    on Cpap    Past Surgical History:  Procedure Laterality Date   CHOLECYSTECTOMY     HAND SURGERY  2010   R hand , d/t a injury, 3 surgeries     Current Outpatient Medications  Medication Instructions   albuterol (VENTOLIN HFA) 108 (90 Base) MCG/ACT inhaler 2 puffs, Inhalation, Every 6 hours PRN   AMBULATORY NON FORMULARY MEDICATION GI Cocktail - 90 mls of 2% Lidocaine<BR>                     90 mls Dicyclomine 39m/5ml<BR>                   270 mls Maalox<BR>Take 5-10 ml every 4-6 hours as needed   amLODipine (NORVASC) 2.5 mg, Oral, Daily   aspirin EC 81 mg, Oral, Daily, Each morning. Swallow whole.    atorvastatin (LIPITOR) 40 MG tablet TAKE 1 TABLET BY MOUTH AT BEDTIME.   Caraway Oil-Levomenthol (FDGARD) 25-20.75 MG CAPS 2 tablets, Oral, 2 times daily   diclofenac sodium  (VOLTAREN) 4 g, Topical, 3 times daily PRN   dicyclomine (BENTYL) 20 mg, Oral, 3 times daily before meals   escitalopram (LEXAPRO) 10 mg, Oral, Daily   famotidine (PEPCID) 20 mg, Oral, 2 times daily   Flaxseed Oil 2,000 mg, Oral, Daily   fluticasone (FLONASE) 50 MCG/ACT nasal spray 1 spray, Each Nare, Daily   fluticasone furoate-vilanterol (BREO ELLIPTA) 100-25 MCG/ACT AEPB 1 puff, Inhalation, Daily, Rinse mouth after each use.   furosemide (LASIX) 40 mg, Oral, Daily   hydrocortisone 2.5 % cream Topical, 2 times daily   losartan (COZAAR) 100 mg, Oral, Daily   meclizine (ANTIVERT) 12.5 mg, Oral, 3 times daily PRN   Multiple Vitamin (MULTIVITAMIN) tablet 1 tablet, Oral, Daily   omeprazole (PRILOSEC) 20 MG capsule TAKE 1 CAPSULE (20 MG TOTAL) BY MOUTH DAILY.   pioglitazone (ACTOS) 45 mg, Oral, Daily   sildenafil (REVATIO) 20 MG tablet TAKE 3 TO 4 TABLETS BY MOUTH AT BEDTIME AS NEEDED.   sitaGLIPtin (JANUVIA) 100 mg, Oral, Daily       Objective:   Physical Exam BP 136/66   Pulse  66   Temp 97.8 F (36.6 C) (Oral)   Resp 16   Ht 5' 5"$  (1.651 m)   Wt 257 lb 8 oz (116.8 kg)   SpO2 97%   BMI 42.85 kg/m  General:   Well developed, NAD, BMI noted. HEENT:  Normocephalic . Face symmetric, atraumatic.  Attempted to check his nostrils, very poor tolerance.  No obvious bleeding, polyps or lesions. Lungs:  CTA B Normal respiratory effort, no intercostal retractions, no accessory muscle use. Heart: RRR,  no murmur.  Lower extremities: trace pretibial edema bilaterally  Skin: Not pale. Not jaundice Neurologic:  alert & oriented X3.  Speech normal, gait appropriate for age and unassisted Psych--  Cognition and judgment appear intact.  Cooperative with normal attention span and concentration.  Behavior appropriate. No anxious or depressed appearing.      Assessment    ASSESSMENT DM (pre-DM until 2016).  Intolerant to Metformin, see office visit 09/09/2019 HTN Polycythemia- sees  hematology, likely d/t OSA-diuretics, JAK2 (-) ~ 2014, last OV 10-2014, f/u prn OSA on CPAP Asthma  Anxiety ("lexapro prn") DJD- saw Dr Rhona Raider before  R Foot drop  GI: -GERD - IBS? GI sx on and off, previously dx w/ IBS -Cscopes: [id number], 09/18/2017 - EGD 04/2018, showed gastritis, no H. pylori, no malignancy Urolithiasis, several episodes CV: Sees cardiology, negative Myoview 2019, CT coronary angiography 08/2019: Minimal disease Chronic dermatitis: Lower extremities, pretibial   PLAN: DM: Last A1c went from 6.5 to 7.2, pioglitazone increased to 45 mg, continue with Januvia.  History of metformin intolerance. Reports he is not doing well with diet.  Advised that a healthy diet is the best tool he has to control his blood sugar. Plan: Check A1c, further advised with results. HTN: On Lasix, losartan, last month BP was elevated, our physician assistant added amlodipine 2.5 mg and since then ambulatory BPs are better.  In the 120, 130 range.  Trace edema noted on today's exam.  Check BMP, continue present care Low TSH: Last TSH was 0.27, slightly decreased.  Check TFTs High cholesterol: On atorvastatin, check FLP OSA: Last visit with pulmonary few days ago. Nosebleeds: As described above, probably related to CPAP usage.  Encouraged to continue using Vaseline at the nostrils at bedtime, increase  humidifier set on the CPAP, Flonase before bedtime, if no better he will let me know.  Refer to ENT? Dizziness: As described above, observation RTC 3 months

## 2022-06-06 NOTE — Assessment & Plan Note (Signed)
DM: Last A1c went from 6.5 to 7.2, pioglitazone increased to 45 mg, continue with Januvia.  History of metformin intolerance. Reports he is not doing well with diet.  Advised that a healthy diet is the best tool he has to control his blood sugar. Plan: Check A1c, further advised with results. HTN: On Lasix, losartan, last month BP was elevated, our physician assistant added amlodipine 2.5 mg and since then ambulatory BPs are better.  In the 120, 130 range.  Trace edema noted on today's exam.  Check BMP, continue present care Low TSH: Last TSH was 0.27, slightly decreased.  Check TFTs High cholesterol: On atorvastatin, check FLP OSA: Last visit with pulmonary few days ago. Nosebleeds: As described above, probably related to CPAP usage.  Encouraged to continue using Vaseline at the nostrils at bedtime, increase  humidifier set on the CPAP, Flonase before bedtime, if no better he will let me know.  Refer to ENT? Dizziness: As described above, observation RTC 3 months

## 2022-06-06 NOTE — Patient Instructions (Addendum)
Vaccines I recommend:  Covid booster  Check the  blood pressure regularly BP GOAL is between 110/65 and  135/85. If it is consistently higher or lower, let me know  Is extremely important you watch your diet closely  Try to increase the humidifier setting on your CPAP Flonase 2 sprays on each side of the nose every afternoon Small amount of Vaseline at both nostrils before bedtime If the nosebleed continue let us know  GO TO THE LAB : Get the blood work     [place], [place] back for checkup in 3 months

## 2022-06-07 NOTE — Addendum Note (Signed)
Addended byDamita Dunnings D on: 06/07/2022 03:34 PM   Modules accepted: Orders

## 2022-06-09 ENCOUNTER — Ambulatory Visit (HOSPITAL_BASED_OUTPATIENT_CLINIC_OR_DEPARTMENT_OTHER)
Admission: RE | Admit: 2022-06-09 | Discharge: 2022-06-09 | Disposition: A | Discharge status: Home / Self Care | Payer: Medicare Other | Source: Ambulatory Visit | Attending: Internal Medicine | Admitting: Internal Medicine

## 2022-06-09 ENCOUNTER — Other Ambulatory Visit (HOSPITAL_BASED_OUTPATIENT_CLINIC_OR_DEPARTMENT_OTHER): Payer: Self-pay

## 2022-06-09 ENCOUNTER — Ambulatory Visit (HOSPITAL_BASED_OUTPATIENT_CLINIC_OR_DEPARTMENT_OTHER): Admission: RE | Admit: 2022-06-09 | Payer: Medicare Other | Source: Ambulatory Visit

## 2022-06-09 DIAGNOSIS — E041 Nontoxic single thyroid nodule: Secondary | ICD-10-CM | POA: Diagnosis not present

## 2022-06-09 DIAGNOSIS — E0590 Thyrotoxicosis, unspecified without thyrotoxic crisis or storm: Secondary | ICD-10-CM | POA: Insufficient documentation

## 2022-06-09 MED ORDER — COMIRNATY 30 MCG/0.3ML IM SUSY
PREFILLED_SYRINGE | INTRAMUSCULAR | 0 refills | Status: DC
  Filled 2022-06-09: qty 0.3, 1d supply, fill #0

## 2022-06-12 NOTE — Addendum Note (Signed)
Addended byDamita Dunnings D on: 06/12/2022 10:03 AM   Modules accepted: Orders

## 2022-06-15 ENCOUNTER — Other Ambulatory Visit (HOSPITAL_BASED_OUTPATIENT_CLINIC_OR_DEPARTMENT_OTHER): Payer: Self-pay

## 2022-06-15 ENCOUNTER — Telehealth: Payer: Self-pay | Admitting: Pharmacist

## 2022-06-15 NOTE — Telephone Encounter (Signed)
Patient has not received decision from [place] regarding assistance for Early. Called [employer] patient assistance program. They report application was processed 04/03/2022 and they mailed an attestation form to patient 04/04/2022 but they have not received signed attestation back.  They cannot proceed with application until patient signs and returns to them in mail.   Unable to reach patient to discuss but LM on VM with CB# [phone] or [phone]

## 2022-06-15 NOTE — Telephone Encounter (Signed)
Patient's wife state that letter they received from [place] said that pt did not qualify for patient assistance program because he had insurance. She did not have letter to read to me. They did not return paperwork in letter.  Patient also asked about if he will continue to get Breo from medication assistance program. I think it was only approved thru 04/16/2022. Will check and also review 2024 formulary to see if [facility] / generic on his [place] .

## 2022-06-16 NOTE — Telephone Encounter (Signed)
Regarding Memory Dance - He was only approved thru 04/16/2022. For 2024, patient would need to spend $600 out of pocket for this year and then we can apply for medication assistance program for [facility] again.  Regarding Scientist, clinical (histocompatibility and immunogenetics) - spoke with [employer]. She states that the attestation form does mention on the front that if patient has insurance that they will need to complete and sign the attestation form that they will not seek reimbursement form insurance if [employer] provides medication thru their medication assistance program. Representative is sending out another attestation form for patient to complete, sign and return to them by mail.

## 2022-06-16 NOTE — Telephone Encounter (Addendum)
Fax number for attestation is [phone]. Patient should expect to receive attestation in mail in 5 to 7 business days.  LM on VM at patient's home number.

## 2022-06-16 NOTE — Telephone Encounter (Signed)
[employer] representative said she is allowing a 1 time exemption to fax the attestation to them when patient receives and completes.

## 2022-06-26 ENCOUNTER — Other Ambulatory Visit: Payer: Self-pay | Admitting: Internal Medicine

## 2022-06-26 DIAGNOSIS — R600 Localized edema: Secondary | ICD-10-CM

## 2022-06-27 ENCOUNTER — Telehealth: Payer: Self-pay | Admitting: Internal Medicine

## 2022-06-27 NOTE — Telephone Encounter (Signed)
Pt dropped off document for Pharmacist to fill out [employer] 1 page with envelope attached) Document put at front office tray under pharmacist name.

## 2022-06-29 NOTE — Telephone Encounter (Signed)
Patient returned attestation form for [employer] patient assistance program. Faxed to [employer] patient assistance program (see notes from 06/15/2022 call - this is a one time exception as [employer] usually requires paperwork to be mailed)

## 2022-07-04 ENCOUNTER — Other Ambulatory Visit (HOSPITAL_BASED_OUTPATIENT_CLINIC_OR_DEPARTMENT_OTHER): Payer: Self-pay

## 2022-07-06 NOTE — Telephone Encounter (Signed)
Pt stated he got the medication he was needing And just wanted to thank Tammy.

## 2022-07-07 DIAGNOSIS — M170 Bilateral primary osteoarthritis of knee: Secondary | ICD-10-CM | POA: Diagnosis not present

## 2022-07-07 DIAGNOSIS — M7022 Olecranon bursitis, left elbow: Secondary | ICD-10-CM | POA: Diagnosis not present

## 2022-07-07 DIAGNOSIS — M1712 Unilateral primary osteoarthritis, left knee: Secondary | ICD-10-CM | POA: Diagnosis not present

## 2022-07-17 ENCOUNTER — Telehealth: Payer: Self-pay | Admitting: Internal Medicine

## 2022-07-17 NOTE — Telephone Encounter (Signed)
Donald Jones (spouse DPR Ok) called stating that pt needs assistance with applying for Breo MAP as he only has 20 more puffs of his current inhaler.

## 2022-07-18 MED ORDER — ACCU-CHEK GUIDE VI STRP: ORAL_STRIP | 5 refills | Status: DC

## 2022-07-18 MED ORDER — ACCU-CHEK FASTCLIX LANCETS MISC: 5 refills | Status: DC

## 2022-07-18 MED ORDER — BREO ELLIPTA 100-25 MCG/ACT IN AEPB: 1.0000 | INHALATION_SPRAY | Freq: Every day | RESPIRATORY_TRACT | 5 refills | Status: DC

## 2022-07-18 NOTE — Telephone Encounter (Signed)
Spoke with patient's wife. To apply for [employer] medication assistance program for Breo patient must spend $600 out of pocket for calendar year. Per wife - Mr. Linenberger has spent $228 (Mrs. Abdulmalik about $100). Will continue to monitor and assist with medication assistance program application when is eligible.   Reviewed patient's formulary. For 2024 the lowest tier for any ICS + LABA inhaler is tier 3 or $47/month.  The following inhalers are tier 3: Breo (brand, not generic), generic Advair, Wixela or brand Symbicort (not generic)  Patient's wife requests Rx for Breo and blood glucose testing supplies to be sent to [facility].

## 2022-07-18 NOTE — Telephone Encounter (Signed)
Donald Jones did not know which Accu Chek glucometer Donald Jones has at home. She will call back with name of meter when they get home so I can send in test strips and lancets.   Donald Jones called back. He has the Fast Click lancet. He has the Accu-Chek Guide glucometer   Meds ordered this encounter  Medications   BREO ELLIPTA 100-25 MCG/ACT AEPB    Sig: Inhale 1 puff into the lungs daily.    Dispense:  30 each    Refill:  5    Brand Breo is preferred per patient [place] for 2024. Should be tier 3 $47/month.   Accu-Chek FastClix Lancets MISC    Sig: Use to check blood glucose once a day (Dx: type 2 DM - E 11.9)    Dispense:  102 each    Refill:  5   glucose blood (ACCU-CHEK GUIDE) test strip    Sig: Use to check blood glucose once a day (Dx: type 2 DM - E 11.9)    Dispense:  100 each    Refill:  5

## 2022-07-18 NOTE — Addendum Note (Signed)
Addended by: Cherre Robins B on: 07/18/2022 02:27 PM   Modules accepted: Orders

## 2022-07-28 DIAGNOSIS — G4733 Obstructive sleep apnea (adult) (pediatric): Secondary | ICD-10-CM | POA: Diagnosis not present

## 2022-07-29 DIAGNOSIS — G4733 Obstructive sleep apnea (adult) (pediatric): Secondary | ICD-10-CM | POA: Diagnosis not present

## 2022-08-05 ENCOUNTER — Other Ambulatory Visit: Payer: Self-pay | Admitting: Internal Medicine

## 2022-08-10 ENCOUNTER — Other Ambulatory Visit: Payer: Self-pay | Admitting: Internal Medicine

## 2022-08-10 NOTE — Telephone Encounter (Signed)
Refill request for ketoconazole 2% cream. No longer on med list. Please advise.  °

## 2022-08-10 NOTE — Telephone Encounter (Signed)
Ok RF x 1 

## 2022-08-23 ENCOUNTER — Ambulatory Visit (INDEPENDENT_AMBULATORY_CARE_PROVIDER_SITE_OTHER): Payer: Self-pay | Admitting: Pharmacist

## 2022-08-23 DIAGNOSIS — J4520 Mild intermittent asthma, uncomplicated: Secondary | ICD-10-CM

## 2022-08-23 DIAGNOSIS — E119 Type 2 diabetes mellitus without complications: Secondary | ICD-10-CM

## 2022-08-23 DIAGNOSIS — R600 Localized edema: Secondary | ICD-10-CM

## 2022-08-23 NOTE — Progress Notes (Signed)
Pharmacy Note  08/23/2022 Name: Donald Jones MRN: [id number] DOB: 19-Nov-1945  Subjective: Donald Jones is a 77 y.o. year old male who is a primary care patient of Wanda Plump, MD. Clinical Pharmacist Practitioner referral was placed to assist with medication management and assistance with medication costs.   Engaged with patient by telephone for follow up visit today. He reports that his wife fell and fractured her pelvis. She is currently in rehab at [facility]. He states his diet has not been as good or regular as usual.  Type 2 DM:  Current therapy - Januvia 100mg  daily and pioglitazone 45mg  daily Past therapy - metformin - unable to tolerate due to GI side effect.   Current blood glucose at home - 109 to 156  Patient states today that he feels like he has gained weight though he has not checked his weight lately. Denies shortness of breath but does report LEE.  He is taking furosemide 40mg  daily   Objective: Review of patient status, including review of consultants reports, laboratory and other test data, was performed as part of comprehensive evaluation and provision of chronic care management services.   Lab Results  Component Value Date   CREATININE 1.41 06/06/2022   CREATININE 1.40 02/03/2022   CREATININE 1.40 09/29/2021    Lab Results  Component Value Date   HGBA1C 7.0 (H) 06/06/2022       Component Value Date/Time   CHOL 103 06/06/2022 0929   TRIG 47.0 06/06/2022 0929   HDL 48.30 06/06/2022 0929   CHOLHDL 2 06/06/2022 0929   VLDL 9.4 06/06/2022 0929   LDLCALC 45 06/06/2022 0929   LDLCALC 49 08/06/2020 1406     Clinical ASCVD: Type 2 DM The ASCVD Risk score (Arnett DK, et al., 2019) failed to calculate for the following reasons:   The valid total cholesterol range is 130 to 320 mg/dL    BP Readings from Last 3 Encounters:  06/06/22 136/66  05/30/22 118/60  05/15/22 (!) 140/80     Allergies  Allergen Reactions   Metformin And Related      Intolerant, GI symptoms   Sulfa Antibiotics Nausea And Vomiting    Medications Reviewed Today     Reviewed by Henrene Pastor, RPH-CPP (Pharmacist) on 08/23/22 at 1141  Med List Status: <None>   Medication Order Taking? Sig Documenting Provider Last Dose Status Informant  Accu-Chek FastClix Lancets MISC [id number] Yes Use to check blood glucose once a day (Dx: type 2 DM - E 11.9) Paz, Nolon Rod, MD Taking Active   albuterol (VENTOLIN HFA) 108 (90 Base) MCG/ACT inhaler [id number] Yes INHALE 2 PUFFS INTO THE LUNGS EVERY 6 HOURS AS NEEDED FOR WHEEZING OR SHORTNESS OF BREATH. Wanda Plump, MD Taking Active   AMBULATORY [facility] [place] MEDICATION [id number]  GI Cocktail - 90 mls of 2% Lidocaine                      90 mls Dicyclomine 10mg /51ml                    270 mls Maalox Take 5-10 ml every 4-6 hours as needed Barrett, Joline Salt, PA-C  Active            Med Note Clydie Braun, Takenya Travaglini B   Thu Feb 16, 2022  4:36 PM) Hasn't used recently   amLODipine (NORVASC) 2.5 MG tablet [id number] Yes Take 1 tablet (2.5 mg total) by mouth daily. Saguier, Ramon Dredge, PA-C Taking  Active   aspirin EC 81 MG tablet [id number] Yes Take 81 mg by mouth daily. Each morning. Swallow whole. [provider] Taking Active   atorvastatin (LIPITOR) 40 MG tablet [id number] Yes TAKE 1 TABLET BY MOUTH AT BEDTIME. Wanda Plump, MD Taking Active   BREO ELLIPTA 100-25 MCG/ACT AEPB [id number] Yes Inhale 1 puff into the lungs daily. Wanda Plump, MD Taking Active   Caraway Oil-Levomenthol (FDGARD) 25-20.75 MG CAPS [id number] Yes Take 2 tablets by mouth in the morning and at bedtime.  [provider] Taking Active Self  diclofenac sodium (VOLTAREN) 1 % GEL [id number] Yes Apply 4 g topically 3 (three) times daily as needed. Wanda Plump, MD Taking Active Self  dicyclomine (BENTYL) 20 MG tablet [id number] Yes Take 1 tablet (20 mg total) by mouth 3 (three) times daily before meals. Wanda Plump, MD Taking Active   escitalopram (LEXAPRO) 10 MG tablet  [id number] Yes Take 1 tablet (10 mg total) by mouth daily. Wanda Plump, MD Taking Active   famotidine (PEPCID) 20 MG tablet [id number] Yes Take 1 tablet (20 mg total) by mouth 2 (two) times daily. Wanda Plump, MD Taking Active   Flaxseed, Linseed, (FLAXSEED OIL) 1000 MG CAPS [id number] Yes Take 2,000 mg by mouth daily.  [provider] Taking Active Self  fluticasone (FLONASE) 50 MCG/ACT nasal spray [id number] Yes Place 1 spray into both nostrils daily. Wanda Plump, MD Taking Active   furosemide (LASIX) 40 MG tablet [id number] Yes TAKE 1 TABLET (40 MG TOTAL) BY MOUTH DAILY. Wanda Plump, MD Taking Active   glucose blood (ACCU-CHEK GUIDE) test strip [id number] Yes Use to check blood glucose once a day (Dx: type 2 DM - E 11.9) Wanda Plump, MD Taking Active   hydrocortisone 2.5 % cream [id number] Yes Apply topically 2 (two) times daily. Wanda Plump, MD Taking Active Self  ketoconazole (NIZORAL) 2 % cream [id number] Yes Apply topically daily. Wanda Plump, MD Taking Active   losartan (COZAAR) 100 MG tablet [id number] Yes TAKE 1 TABLET BY MOUTH DAILY. Worthy Rancher B, FNP Taking Active   meclizine (ANTIVERT) 12.5 MG tablet [id number] Yes Take 1 tablet (12.5 mg total) by mouth 3 (three) times daily as needed for dizziness. Saguier, Ramon Dredge, PA-C Taking Active   Multiple Vitamin (MULTIVITAMIN) tablet [id number] Yes Take 1 tablet by mouth daily. [provider] Taking Active Self  omeprazole (PRILOSEC) 20 MG capsule [id number] Yes TAKE 1 CAPSULE (20 MG TOTAL) BY MOUTH DAILY. Wanda Plump, MD Taking Active   pioglitazone (ACTOS) 45 MG tablet [id number] Yes TAKE 1 TABLET (45 MG TOTAL) BY MOUTH DAILY. Worthy Rancher B, FNP Taking Active   sildenafil (REVATIO) 20 MG tablet [id number] Yes TAKE 3 TO 4 TABLETS BY MOUTH AT BEDTIME AS NEEDED. Wanda Plump, MD Taking Active   sitaGLIPtin (JANUVIA) 100 MG tablet [id number] Yes Take 1 tablet (100 mg total) by mouth daily. Wanda Plump, MD Taking Active            Med Note  Tania Ade Jul 06, 2022  4:13 PM) Approved for [employer] medication assistance program 2024            Patient Active Problem List   Diagnosis Date Noted   Chronic stasis dermatitis 11/06/2021   Chronic dermatitis- legs 09/01/2021   Asthma 04/06/2021   Anxiety 07/07/2019   High cholesterol 01/20/2018   DJD (degenerative joint disease) 09/06/2017   Erectile dysfunction 09/06/2017  PCP NOTES >>> 01/23/2015   Subclinical hyperthyroidism 10/05/2014   Diabetes mellitus without complication (HCC) 10/05/2014   Morbid obesity (HCC) 12/03/2012   GERD (gastroesophageal reflux disease) 09/24/2012   IBS ? 04/22/2012   Polycythemia    Annual physical exam 06/05/2011   Foot drop, right 06/05/2011   HYPERTENSION, BENIGN SYSTEMIC 06/14/2006   Allergic rhinitis 06/14/2006   NEPHROLITHIASIS 06/14/2006   OSA (obstructive sleep apnea) 06/14/2006     Medication Assistance:    Januvia obtained through [employer]  medication assistance program.  Enrollment ends 04/17/2023 ; Patient was able to get Breo thru patient assistance program at the end of 2023 but [employer] patient assistance program requires patient to spend $600 out of pocket for current year to apply to their program. Patient's current out of pocket per [facility] is $310.28   Assessment / Plan: Edema / Weight gain - could be fluid retention related to pioglitazone or amlodipine.  Recommended patient take furosemide 40mg  each morning and 20mg  each afternoon for 2 days, then go back to usual dose of 40mg  qam.  Patient is to check weight each morning for the next 7 days and record.  I will check with patient next week.  If continues to have weight gain / edema - may consider either lower dose of pioglitazone from 45mg  to 30mg  daliy or hold amlodipine 2.5mg  daily.   Type 2 DM -  Continue Januvia 100mg  daily and pioglitazone 45mg  daily  Patient is due to see Dr Drue Novel 09/12/2022 for follow up.   Asthma Continue Breo 1 puff daily.   Once patient reaches $600 out of pocket spent on medications for 2024 will apply for [employer] medication assistance program for Breo.   Follow Up:  Follow up 08/23/2022 to review weight and edema.    Henrene Pastor, PharmD Clinical Pharmacist [facility]  - [facility] [phone]

## 2022-08-28 ENCOUNTER — Ambulatory Visit (INDEPENDENT_AMBULATORY_CARE_PROVIDER_SITE_OTHER): Payer: Medicare Other | Admitting: Pharmacist

## 2022-08-28 ENCOUNTER — Other Ambulatory Visit: Payer: Self-pay | Admitting: Internal Medicine

## 2022-08-28 VITALS — BP 130/68 | HR 71 | Wt 263.0 lb

## 2022-08-28 DIAGNOSIS — R600 Localized edema: Secondary | ICD-10-CM

## 2022-08-28 DIAGNOSIS — I10 Essential (primary) hypertension: Secondary | ICD-10-CM

## 2022-08-28 DIAGNOSIS — J4520 Mild intermittent asthma, uncomplicated: Secondary | ICD-10-CM | POA: Diagnosis not present

## 2022-08-28 DIAGNOSIS — E119 Type 2 diabetes mellitus without complications: Secondary | ICD-10-CM

## 2022-08-28 MED ORDER — PIOGLITAZONE HCL 30 MG PO TABS: 30.0000 mg | ORAL_TABLET | Freq: Every day | ORAL | 1 refills | Status: DC

## 2022-08-28 NOTE — Progress Notes (Signed)
Pharmacy Note  08/28/2022 Name: Donald Jones MRN: [id number] DOB: 01-26-1946  Subjective: Donald Jones is a 77 y.o. year old male who is a primary care patient of Wanda Plump, MD. Clinical Pharmacist Practitioner referral was placed to assist with medication management   Engaged with patient face to face for follow up visit today.    Last week I had a phone visit with patient. He had reported that he felt he had gained weight though he had not checked his weight lately. Denied shortness of breath but does report LEE.  Instructed him to take extra 20mg  of furosemide for 2 days, however patient states he did not take any extra furosemide because he was afraid it would make him urinate more.  He is taking furosemide 40mg  daily   Filed Weights   08/28/22 1420  Weight: 263 lb (119.3 kg)     Type 2 DM:  Current therapy - Januvia 100mg  daily and pioglitazone 45mg  daily Past therapy - metformin - unable to tolerate due to GI side effect.   Hypertension:  Current therapy - losartan 100mg  daily, amlodipine 2.5mg  daily and furosemide 40mg  each morning  BP Readings from Last 3 Encounters:  08/28/22 130/68  06/06/22 136/66  05/30/22 118/60    Objective: Review of patient status, including review of consultants reports, laboratory and other test data, was performed as part of comprehensive evaluation and provision of chronic care management services.   Lab Results  Component Value Date   CREATININE 1.41 06/06/2022   CREATININE 1.40 02/03/2022   CREATININE 1.40 09/29/2021    Lab Results  Component Value Date   HGBA1C 7.0 (H) 06/06/2022       Component Value Date/Time   CHOL 103 06/06/2022 0929   TRIG 47.0 06/06/2022 0929   HDL 48.30 06/06/2022 0929   CHOLHDL 2 06/06/2022 0929   VLDL 9.4 06/06/2022 0929   LDLCALC 45 06/06/2022 0929   LDLCALC 49 08/06/2020 1406     Clinical ASCVD: Type 2 DM The ASCVD Risk score (Arnett DK, et al., 2019) failed to calculate for  the following reasons:   The valid total cholesterol range is 130 to 320 mg/dL    BP Readings from Last 3 Encounters:  08/28/22 130/68  06/06/22 136/66  05/30/22 118/60     Allergies  Allergen Reactions   Metformin And Related     Intolerant, GI symptoms   Sulfa Antibiotics Nausea And Vomiting    Medications Reviewed Today     Reviewed by Henrene Pastor, RPH-CPP (Pharmacist) on 08/23/22 at 1141  Med List Status: <None>   Medication Order Taking? Sig Documenting Provider Last Dose Status Informant  Accu-Chek FastClix Lancets MISC [id number] Yes Use to check blood glucose once a day (Dx: type 2 DM - E 11.9) Paz, Nolon Rod, MD Taking Active   albuterol (VENTOLIN HFA) 108 (90 Base) MCG/ACT inhaler [id number] Yes INHALE 2 PUFFS INTO THE LUNGS EVERY 6 HOURS AS NEEDED FOR WHEEZING OR SHORTNESS OF BREATH. Wanda Plump, MD Taking Active   AMBULATORY Clent [place] MEDICATION [id number]  GI Cocktail - 90 mls of 2% Lidocaine                      90 mls Dicyclomine 10mg /16ml                    270 mls Maalox Take 5-10 ml every 4-6 hours as needed Barrett, Joline Salt, PA-C  Active  Med Note Clydie Braun, Sanna Porcaro B   Thu Feb 16, 2022  4:36 PM) Hasn't used recently   amLODipine (NORVASC) 2.5 MG tablet [id number] Yes Take 1 tablet (2.5 mg total) by mouth daily. Saguier, Ramon Dredge, PA-C Taking Active   aspirin EC 81 MG tablet [id number] Yes Take 81 mg by mouth daily. Each morning. Swallow whole. [provider] Taking Active   atorvastatin (LIPITOR) 40 MG tablet [id number] Yes TAKE 1 TABLET BY MOUTH AT BEDTIME. Wanda Plump, MD Taking Active   BREO ELLIPTA 100-25 MCG/ACT AEPB [id number] Yes Inhale 1 puff into the lungs daily. Wanda Plump, MD Taking Active   Caraway Oil-Levomenthol (FDGARD) 25-20.75 MG CAPS [id number] Yes Take 2 tablets by mouth in the morning and at bedtime.  [provider] Taking Active Self  diclofenac sodium (VOLTAREN) 1 % GEL [id number] Yes Apply 4 g topically 3 (three) times  daily as needed. Wanda Plump, MD Taking Active Self  dicyclomine (BENTYL) 20 MG tablet [id number] Yes Take 1 tablet (20 mg total) by mouth 3 (three) times daily before meals. Wanda Plump, MD Taking Active   escitalopram (LEXAPRO) 10 MG tablet [id number] Yes Take 1 tablet (10 mg total) by mouth daily. Wanda Plump, MD Taking Active   famotidine (PEPCID) 20 MG tablet [id number] Yes Take 1 tablet (20 mg total) by mouth 2 (two) times daily. Wanda Plump, MD Taking Active   Flaxseed, Linseed, (FLAXSEED OIL) 1000 MG CAPS [id number] Yes Take 2,000 mg by mouth daily.  [provider] Taking Active Self  fluticasone (FLONASE) 50 MCG/ACT nasal spray [id number] Yes Place 1 spray into both nostrils daily. Wanda Plump, MD Taking Active   furosemide (LASIX) 40 MG tablet [id number] Yes TAKE 1 TABLET (40 MG TOTAL) BY MOUTH DAILY. Wanda Plump, MD Taking Active   glucose blood (ACCU-CHEK GUIDE) test strip [id number] Yes Use to check blood glucose once a day (Dx: type 2 DM - E 11.9) Wanda Plump, MD Taking Active   hydrocortisone 2.5 % cream [id number] Yes Apply topically 2 (two) times daily. Wanda Plump, MD Taking Active Self  ketoconazole (NIZORAL) 2 % cream [id number] Yes Apply topically daily. Wanda Plump, MD Taking Active   losartan (COZAAR) 100 MG tablet [id number] Yes TAKE 1 TABLET BY MOUTH DAILY. Worthy Rancher B, FNP Taking Active   meclizine (ANTIVERT) 12.5 MG tablet [id number] Yes Take 1 tablet (12.5 mg total) by mouth 3 (three) times daily as needed for dizziness. Saguier, Ramon Dredge, PA-C Taking Active   Multiple Vitamin (MULTIVITAMIN) tablet [id number] Yes Take 1 tablet by mouth daily. [provider] Taking Active Self  omeprazole (PRILOSEC) 20 MG capsule [id number] Yes TAKE 1 CAPSULE (20 MG TOTAL) BY MOUTH DAILY. Wanda Plump, MD Taking Active   pioglitazone (ACTOS) 45 MG tablet [id number] Yes TAKE 1 TABLET (45 MG TOTAL) BY MOUTH DAILY. Worthy Rancher B, FNP Taking Active   sildenafil (REVATIO) 20 MG tablet  [id number] Yes TAKE 3 TO 4 TABLETS BY MOUTH AT BEDTIME AS NEEDED. Wanda Plump, MD Taking Active   sitaGLIPtin (JANUVIA) 100 MG tablet [id number] Yes Take 1 tablet (100 mg total) by mouth daily. Wanda Plump, MD Taking Active            Med Note Clydie Braun, Cindie Laroche Jul 06, 2022  4:13 PM) Approved for [employer] medication assistance program 2024            Patient Active Problem List   Diagnosis  Date Noted   Chronic stasis dermatitis 11/06/2021   Chronic dermatitis- legs 09/01/2021   Asthma 04/06/2021   Anxiety 07/07/2019   High cholesterol 01/20/2018   DJD (degenerative joint disease) 09/06/2017   Erectile dysfunction 09/06/2017   PCP NOTES >>> 01/23/2015   Subclinical hyperthyroidism 10/05/2014   Diabetes mellitus without complication (HCC) 10/05/2014   Morbid obesity (HCC) 12/03/2012   GERD (gastroesophageal reflux disease) 09/24/2012   IBS ? 04/22/2012   Polycythemia    Annual physical exam 06/05/2011   Foot drop, right 06/05/2011   HYPERTENSION, BENIGN SYSTEMIC 06/14/2006   Allergic rhinitis 06/14/2006   NEPHROLITHIASIS 06/14/2006   OSA (obstructive sleep apnea) 06/14/2006     Medication Assistance:    Januvia obtained through [employer]  medication assistance program.  Enrollment ends 04/17/2023 ; Patient was able to get Breo thru patient assistance program at the end of 2023 but [employer] patient assistance program requires patient to spend $600 out of pocket for current year to apply to their program. Patient's current out of pocket per [facility] is $310.28   Assessment / Plan: Edema / Weight gain - could be fluid retention related to pioglitazone or amlodipine.  Lowered dose of pioglitazone from 45mg  daily to 30mg  daily. .  Patient is to check weight each morning for the next 7 days and record.  I will check with patient next week. If weight gain continues recommend he see PCP sooner.   Type 2 DM -  Continue Januvia 100mg  daily Lower dose of pioglitazone to 30mg  daily   Patient is due to see Dr Drue Novel 09/12/2022 for follow up.   Asthma Continue Breo 1 puff daily.  Once patient reaches $600 out of pocket spent on medications for 2024 will apply for [employer] medication assistance program for Breo.   Hypertension:  Continue amlodipine 2.5mg  daily, furosemide 40mg  daily and losartan 100mg  daily   Follow Up:  Follow up 7 to 10 days to review weight and edema.    Henrene Pastor, PharmD Clinical Pharmacist [facility]  - [facility] [phone]

## 2022-08-28 NOTE — Patient Instructions (Signed)
Lowering dose of pioglitazone to 30mg  (remember to take 45mg  dose out of your weekly pill container)   This should help with swelling in you legs. If you don't see improvement call the office to see Dr Drue Novel.

## 2022-09-02 ENCOUNTER — Other Ambulatory Visit: Payer: Self-pay | Admitting: Medical

## 2022-09-04 ENCOUNTER — Ambulatory Visit (INDEPENDENT_AMBULATORY_CARE_PROVIDER_SITE_OTHER): Payer: Self-pay | Admitting: Pharmacist

## 2022-09-04 DIAGNOSIS — R600 Localized edema: Secondary | ICD-10-CM

## 2022-09-04 DIAGNOSIS — J4520 Mild intermittent asthma, uncomplicated: Secondary | ICD-10-CM

## 2022-09-04 DIAGNOSIS — I10 Essential (primary) hypertension: Secondary | ICD-10-CM

## 2022-09-04 DIAGNOSIS — E119 Type 2 diabetes mellitus without complications: Secondary | ICD-10-CM

## 2022-09-04 MED ORDER — LOSARTAN POTASSIUM 100 MG PO TABS: 100.0000 mg | ORAL_TABLET | Freq: Every day | ORAL | 3 refills | Status: DC

## 2022-09-04 NOTE — Progress Notes (Signed)
Pharmacy Note  09/04/2022 Name: Donald Jones MRN: [id number] DOB: [birth year]  Subjective: Donald Jones is a 77 y.o. year old male who is a primary care patient of Wanda Plump, MD. Clinical Pharmacist Practitioner referral was placed to assist with medication management   Engaged with patient by telephone for follow up visit today.    LEE: Last week I has an office visit with patient due to increase in edema. He had reported that he felt he had gained weight though he had not checked his weight lately. Denied shortness of breath but I did note LEE. Had encouraged him to take extra furosemide 20mg  for 2 or 3 days previously but patient did not due to concerns about increased urination.  Lowered dose of pioglitazone from 45mg  daily to 30mg  daily to see if edema would improve. Patient reports today that swelling has improved. He is also elevating feet each evening.   Type 2 DM:  Current therapy - Januvia 100mg  daily and pioglitazone 30mg  daily Past therapy - metformin - unable to tolerate due to GI side effect.   No blood glucose readings reports (Patient was visiting his wife at SNF / rehab)   Hypertension:  Current therapy - losartan 100mg  daily, amlodipine 2.5mg  daily and furosemide 40mg  each morning Patient requests renewal of losartan  BP Readings from Last 3 Encounters:  08/28/22 130/68  06/06/22 136/66  05/30/22 118/60    Objective: Review of patient status, including review of consultants reports, laboratory and other test data, was performed as part of comprehensive evaluation and provision of chronic care management services.   Lab Results  Component Value Date   CREATININE 1.41 06/06/2022   CREATININE 1.40 02/03/2022   CREATININE 1.40 09/29/2021    Lab Results  Component Value Date   HGBA1C 7.0 (H) 06/06/2022       Component Value Date/Time   CHOL 103 06/06/2022 0929   TRIG 47.0 06/06/2022 0929   HDL 48.30 06/06/2022 0929   CHOLHDL 2 06/06/2022 0929    VLDL 9.4 06/06/2022 0929   LDLCALC 45 06/06/2022 0929   LDLCALC 49 08/06/2020 1406     Clinical ASCVD: Type 2 DM The ASCVD Risk score (Arnett DK, et al., 2019) failed to calculate for the following reasons:   The valid total cholesterol range is 130 to 320 mg/dL    BP Readings from Last 3 Encounters:  08/28/22 130/68  06/06/22 136/66  05/30/22 118/60     Allergies  Allergen Reactions   Metformin And Related     Intolerant, GI symptoms   Sulfa Antibiotics Nausea And Vomiting    Medications Reviewed Today     Reviewed by Henrene Pastor, RPH-CPP (Pharmacist) on 09/04/22 at 1601  Med List Status: <None>   Medication Order Taking? Sig Documenting Provider Last Dose Status Informant  Accu-Chek FastClix Lancets MISC [id number] Yes Use to check blood glucose once a day (Dx: type 2 DM - E 11.9) Paz, Nolon Rod, MD Taking Active   albuterol (VENTOLIN HFA) 108 (90 Base) MCG/ACT inhaler [id number] Yes INHALE 2 PUFFS INTO THE LUNGS EVERY 6 HOURS AS NEEDED FOR WHEEZING OR SHORTNESS OF BREATH. Wanda Plump, MD Taking Active   AMBULATORY NON FORMULARY MEDICATION [id number] No GI Cocktail - 90 mls of 2% Lidocaine                      90 mls Dicyclomine 10mg /89ml  270 mls Maalox Take 5-10 ml every 4-6 hours as needed  Patient not taking: Reported on 09/04/2022   Barrett, Joline Salt, PA-C Not Taking Active            Med Note Clydie Braun, Judy Goodenow B   Thu Feb 16, 2022  4:36 PM) Hasn't used recently   amLODipine (NORVASC) 2.5 MG tablet [id number] Yes TAKE 1 TABLET (2.5 MG TOTAL) BY MOUTH DAILY. Saguier, Ramon Dredge, PA-C Taking Active   aspirin EC 81 MG tablet [id number] Yes Take 81 mg by mouth daily. Each morning. Swallow whole. [provider] Taking Active   atorvastatin (LIPITOR) 40 MG tablet [id number] Yes TAKE 1 TABLET BY MOUTH AT BEDTIME. Wanda Plump, MD Taking Active   BREO ELLIPTA 100-25 MCG/ACT AEPB [id number] Yes Inhale 1 puff into the lungs daily. Wanda Plump, MD Taking Active    Caraway Oil-Levomenthol (FDGARD) 25-20.75 MG CAPS [id number] Yes Take 2 tablets by mouth in the morning and at bedtime.  [provider] Taking Active Self  diclofenac sodium (VOLTAREN) 1 % GEL [id number] Yes Apply 4 g topically 3 (three) times daily as needed. Wanda Plump, MD Taking Active Self  dicyclomine (BENTYL) 20 MG tablet [id number] Yes Take 1 tablet (20 mg total) by mouth 3 (three) times daily before meals. Wanda Plump, MD Taking Active   escitalopram (LEXAPRO) 10 MG tablet [id number] Yes Take 1 tablet (10 mg total) by mouth daily. Wanda Plump, MD Taking Active   famotidine (PEPCID) 20 MG tablet [id number] Yes Take 1 tablet (20 mg total) by mouth 2 (two) times daily. Wanda Plump, MD Taking Active   Flaxseed, Linseed, (FLAXSEED OIL) 1000 MG CAPS [id number] Yes Take 2,000 mg by mouth daily.  [provider] Taking Active Self  fluticasone (FLONASE) 50 MCG/ACT nasal spray [id number] Yes Place 1 spray into both nostrils daily. Wanda Plump, MD Taking Active   furosemide (LASIX) 40 MG tablet [id number] Yes TAKE 1 TABLET (40 MG TOTAL) BY MOUTH DAILY. Wanda Plump, MD Taking Active   glucose blood (ACCU-CHEK GUIDE) test strip [id number] Yes Use to check blood glucose once a day (Dx: type 2 DM - E 11.9) Wanda Plump, MD Taking Active   hydrocortisone 2.5 % cream [id number] Yes Apply topically 2 (two) times daily. Wanda Plump, MD Taking Active Self  ketoconazole (NIZORAL) 2 % cream [id number] Yes Apply topically daily. Wanda Plump, MD Taking Active   losartan (COZAAR) 100 MG tablet [id number] Yes Take 1 tablet (100 mg total) by mouth daily. Wanda Plump, MD Taking Active   meclizine (ANTIVERT) 12.5 MG tablet [id number] Yes Take 1 tablet (12.5 mg total) by mouth 3 (three) times daily as needed for dizziness. Saguier, Ramon Dredge, PA-C Taking Active   Multiple Vitamin (MULTIVITAMIN) tablet [id number] Yes Take 1 tablet by mouth daily. [provider] Taking Active Self  omeprazole (PRILOSEC) 20 MG  capsule [id number] Yes TAKE 1 CAPSULE (20 MG TOTAL) BY MOUTH DAILY. Wanda Plump, MD Taking Active   pioglitazone (ACTOS) 30 MG tablet [id number] Yes Take 1 tablet (30 mg total) by mouth daily. Wanda Plump, MD Taking Active   sildenafil (REVATIO) 20 MG tablet [id number] Yes TAKE 3 TO 4 TABLETS BY MOUTH AT BEDTIME AS NEEDED. Wanda Plump, MD Taking Active   sitaGLIPtin (JANUVIA) 100 MG tablet [id number] Yes Take 1 tablet (100 mg total) by mouth daily. Wanda Plump, MD Taking Active  Med Note Tania Ade Jul 06, 2022  4:13 PM) Approved for [employer] medication assistance program 2024            Patient Active Problem List   Diagnosis Date Noted   Chronic stasis dermatitis 11/06/2021   Chronic dermatitis- legs 09/01/2021   Asthma 04/06/2021   Anxiety 07/07/2019   High cholesterol 01/20/2018   DJD (degenerative joint disease) 09/06/2017   Erectile dysfunction 09/06/2017   PCP NOTES >>> 01/23/2015   Subclinical hyperthyroidism 10/05/2014   Diabetes mellitus without complication (HCC) 10/05/2014   Morbid obesity (HCC) 12/03/2012   GERD (gastroesophageal reflux disease) 09/24/2012   IBS ? 04/22/2012   Polycythemia    Annual physical exam 06/05/2011   Foot drop, right 06/05/2011   HYPERTENSION, BENIGN SYSTEMIC 06/14/2006   Allergic rhinitis 06/14/2006   NEPHROLITHIASIS 06/14/2006   OSA (obstructive sleep apnea) 06/14/2006     Medication Assistance:    Januvia obtained through [employer]  medication assistance program.  Enrollment ends 04/17/2023 ; Patient was able to get Breo thru patient assistance program at the end of 2023 but [employer] patient assistance program requires patient to spend $600 out of pocket for current year to apply to their program. Patient's current out of pocket per [facility] is $310.28   Assessment / Plan: Edema / Weight gain - improved since lowered dose of pioglitazone.   Continue pioglitazone 30mg  daily. .  Continue to check weight each morning    Type 2 DM -  Continue Januvia 100mg  daily and pioglitazone 30mg  daily  Reviewed dose of Januvia with eGFR - does is appropriate.  Patient is due to see Dr Drue Novel 09/12/2022 for follow up.   Asthma Continue Breo 1 puff daily.  Once patient reaches $600 out of pocket spent on medications for 2024 will apply for [employer] medication assistance program for Breo.   Hypertension:  Continue amlodipine 2.5mg  daily, furosemide 40mg  daily and losartan 100mg  daily    Meds ordered this encounter  Medications   losartan (COZAAR) 100 MG tablet    Sig: Take 1 tablet (100 mg total) by mouth daily.    Dispense:  100 tablet    Refill:  3    Follow Up:  1 to 2 months.    Henrene Pastor, PharmD Clinical Pharmacist [name]  - [facility] [phone]

## 2022-09-08 ENCOUNTER — Ambulatory Visit: Payer: Medicare Other

## 2022-09-12 ENCOUNTER — Ambulatory Visit (INDEPENDENT_AMBULATORY_CARE_PROVIDER_SITE_OTHER): Payer: Medicare Other | Admitting: Internal Medicine

## 2022-09-12 ENCOUNTER — Encounter: Payer: Self-pay | Admitting: Internal Medicine

## 2022-09-12 VITALS — BP 136/66 | HR 66 | Temp 97.7°F | Resp 18 | Ht 65.0 in | Wt 265.5 lb

## 2022-09-12 DIAGNOSIS — I10 Essential (primary) hypertension: Secondary | ICD-10-CM | POA: Diagnosis not present

## 2022-09-12 DIAGNOSIS — R600 Localized edema: Secondary | ICD-10-CM

## 2022-09-12 DIAGNOSIS — Z7984 Long term (current) use of oral hypoglycemic drugs: Secondary | ICD-10-CM

## 2022-09-12 DIAGNOSIS — E0590 Thyrotoxicosis, unspecified without thyrotoxic crisis or storm: Secondary | ICD-10-CM | POA: Diagnosis not present

## 2022-09-12 DIAGNOSIS — E119 Type 2 diabetes mellitus without complications: Secondary | ICD-10-CM | POA: Diagnosis not present

## 2022-09-12 LAB — COMPREHENSIVE METABOLIC PANEL
ALT: 21 U/L (ref 0–53)
AST: 19 U/L (ref 0–37)
Albumin: 3.9 g/dL (ref 3.5–5.2)
Alkaline Phosphatase: 84 U/L (ref 39–117)
BUN: 21 mg/dL (ref 6–23)
CO2: 30 mEq/L (ref 19–32)
Calcium: 9.7 mg/dL (ref 8.4–10.5)
Chloride: 101 mEq/L (ref 96–112)
Creatinine, Ser: 1.32 mg/dL (ref 0.40–1.50)
GFR: 53.03 mL/min — ABNORMAL LOW (ref >60.00)
Glucose, Bld: 208 mg/dL — ABNORMAL HIGH (ref 70–99)
Potassium: 4.7 mEq/L (ref 3.5–5.1)
Sodium: 138 mEq/L (ref 135–145)
Total Bilirubin: 1.4 mg/dL — ABNORMAL HIGH (ref 0.2–1.2)
Total Protein: 6.6 g/dL (ref 6.0–8.3)

## 2022-09-12 LAB — CBC WITH DIFFERENTIAL/PLATELET
Basophils Absolute: 0 10*3/uL (ref 0.0–0.1)
Basophils Relative: 0.7 % (ref 0.0–3.0)
Eosinophils Absolute: 0.2 10*3/uL (ref 0.0–0.7)
Eosinophils Relative: 3.6 % (ref 0.0–5.0)
HCT: 42.2 % (ref 39.0–52.0)
Hemoglobin: 13.8 g/dL (ref 13.0–17.0)
Lymphocytes Relative: 24.5 % (ref 12.0–46.0)
Lymphs Abs: 1.5 10*3/uL (ref 0.7–4.0)
MCHC: 32.7 g/dL (ref 30.0–36.0)
MCV: 96.3 fl (ref 78.0–100.0)
Monocytes Absolute: 0.5 10*3/uL (ref 0.1–1.0)
Monocytes Relative: 8.3 % (ref 3.0–12.0)
Neutro Abs: 3.8 10*3/uL (ref 1.4–7.7)
Neutrophils Relative %: 62.9 % (ref 43.0–77.0)
Platelets: 189 10*3/uL (ref 150.0–400.0)
RBC: 4.39 Mil/uL (ref 4.22–5.81)
RDW: 14.1 % (ref 11.5–15.5)
WBC: 6 10*3/uL (ref 4.0–10.5)

## 2022-09-12 LAB — HEMOGLOBIN A1C: Hgb A1c MFr Bld: 7.1 % — ABNORMAL HIGH (ref 4.6–6.5)

## 2022-09-12 MED ORDER — EMPAGLIFLOZIN 10 MG PO TABS
10.0000 mg | ORAL_TABLET | Freq: Every day | ORAL | 1 refills | Status: DC
Start: 2022-09-12 — End: 2022-11-06

## 2022-09-12 NOTE — Patient Instructions (Addendum)
Please call the endocrinologist (Dr. Sharl Ma) office and get an appointment with him.  Your thyroid seems to be overworking. His phone number is [phone]  Stop pioglitazone Watch your salt intake Leg elevation Start Jardiance 10 mg daily.  Watch for side effects such as a rash at your private area.     GO TO THE LAB : Get the blood work     GO TO THE FRONT DESK, PLEASE SCHEDULE YOUR APPOINTMENTS Come back for   checkup in 1 month

## 2022-09-12 NOTE — Progress Notes (Signed)
Subjective:    Patient ID: Donald Jones, male    DOB: [birth year], 77 y.o.   MRN: [id number]  DOS:  09/12/2022 Type of visit - description: Follow-up  In general feeling well however his main concern is lower extremity edema,  our clinical pharmacist rec to decrease  pioglitazone dose, edema has not changed much.  No fever or chills + DOE, not a new issue. No orthopnea no cough.   Wt Readings from Last 3 Encounters:  09/12/22 265 lb 8 oz (120.4 kg)  08/28/22 263 lb (119.3 kg)  06/06/22 257 lb 8 oz (116.8 kg)   Review of Systems See above   Past Medical History:  Diagnosis Date   Anxiety    Arthritis    hands right   Diabetes mellitus without complication (HCC)    GERD (gastroesophageal reflux disease)    Hypertension    Kidney stones    several episodes.  Last one in 2011.     Obesity    100-lb weight loss since 2011.    Polycythemia    Sleep apnea    on Cpap    Past Surgical History:  Procedure Laterality Date   CHOLECYSTECTOMY     HAND SURGERY  2010   R hand , d/t a injury, 3 surgeries     Current Outpatient Medications  Medication Instructions   Accu-Chek FastClix Lancets MISC Use to check blood glucose once a day (Dx: type 2 DM - E 11.9)   albuterol (VENTOLIN HFA) 108 (90 Base) MCG/ACT inhaler 2 puffs, Inhalation, Every 6 hours PRN   AMBULATORY NON FORMULARY MEDICATION GI Cocktail - 90 mls of 2% Lidocaine<BR>                     90 mls Dicyclomine 10mg /30ml<BR>                   270 mls Maalox<BR>Take 5-10 ml every 4-6 hours as needed   amLODipine (NORVASC) 2.5 mg, Oral, Daily   aspirin EC 81 mg, Oral, Daily, Each morning. Swallow whole.    atorvastatin (LIPITOR) 40 MG tablet TAKE 1 TABLET BY MOUTH AT BEDTIME.   BREO ELLIPTA 100-25 MCG/ACT AEPB 1 puff, Inhalation, Daily   Caraway Oil-Levomenthol (FDGARD) 25-20.75 MG CAPS 2 tablets, Oral, 2 times daily   diclofenac sodium (VOLTAREN) 4 g, Topical, 3 times daily PRN   dicyclomine (BENTYL) 20 mg, Oral, 3  times daily before meals   empagliflozin (JARDIANCE) 10 mg, Oral, Daily before breakfast   escitalopram (LEXAPRO) 10 mg, Oral, Daily   famotidine (PEPCID) 20 mg, Oral, 2 times daily   Flaxseed Oil 2,000 mg, Oral, Daily   fluticasone (FLONASE) 50 MCG/ACT nasal spray 1 spray, Each Nare, Daily   furosemide (LASIX) 40 mg, Oral, Daily   glucose blood (ACCU-CHEK GUIDE) test strip Use to check blood glucose once a day (Dx: type 2 DM - E 11.9)   hydrocortisone 2.5 % cream Topical, 2 times daily   ketoconazole (NIZORAL) 2 % cream Topical, Daily   losartan (COZAAR) 100 mg, Oral, Daily   meclizine (ANTIVERT) 12.5 mg, Oral, 3 times daily PRN   Multiple Vitamin (MULTIVITAMIN) tablet 1 tablet, Oral, Daily   omeprazole (PRILOSEC) 20 MG capsule TAKE 1 CAPSULE (20 MG TOTAL) BY MOUTH DAILY.   pioglitazone (ACTOS) 30 mg, Oral, Daily   sildenafil (REVATIO) 20 MG tablet TAKE 3 TO 4 TABLETS BY MOUTH AT BEDTIME AS NEEDED.   sitaGLIPtin (JANUVIA) 100 mg, Oral, Daily  Objective:   Physical Exam BP 136/66   Pulse 66   Temp 97.7 F (36.5 C) (Oral)   Resp 18   Ht 5\' 5"  (1.651 m)   Wt 265 lb 8 oz (120.4 kg)   SpO2 96%   BMI 44.18 kg/m  General:   Well developed, NAD, BMI noted. HEENT:  Normocephalic . Face symmetric, atraumatic Neck: No JVD at 45 degrees Lungs:  CTA B Normal respiratory effort, no intercostal retractions, no accessory muscle use. Heart: RRR,  no murmur.  Lower extremities: +/+++ Pitting edema from mid thigh bilaterally down to the mid pretibial area Skin: Not pale. Not jaundice Neurologic:  alert & oriented X3.  Speech normal, gait appropriate for age and unassisted Psych--  Cognition and judgment appear intact.  Cooperative with normal attention span and concentration.  Behavior appropriate. No anxious or depressed appearing.      Assessment    ASSESSMENT DM--Intolerant to Metformin, see office visit 09/09/2019 HTN Polycythemia- sees hematology, likely d/t  OSA-diuretics, JAK2 (-) ~ 2014, last OV 10-2014, f/u prn OSA on CPAP Asthma  Anxiety ("lexapro prn") DJD- saw Dr Jerl Santos before  R Foot drop  GI: -GERD - IBS? GI sx on and off, previously dx w/ IBS -Cscopes: [id number], 09/18/2017 - EGD 04/2018, showed gastritis, no H. pylori, no malignancy Urolithiasis, several episodes CV: Sees cardiology, negative Myoview 2019, CT coronary angiography 08/2019: Minimal disease Chronic dermatitis: Lower extremities, pretibial   PLAN: Edema: Gradual development of lower extremity edema,  pioglitazone dose increased to 45 mg on 12-2021.  Overall suspect actos is more likely than amlodipine to be the cause of edema.  Plan: Stop pioglitazone, continue Lasix.  Low-salt diet, leg elevation.  Reassess soon. DM, A1c February 2024 was 7.0.  Slightly improved compared to previous A1c.  Currently on Januvia and pioglitazone.  Stopping pioglitazone.  Start Jardiance 10 mg.  Watch for side effects.  If unable to afford consider glipizide HTN: BP looks very good, continue amlodipine 2.5 mg, Lasix, losartan.. Subclinical hypothyroidism: Last TSH slightly suppressed at 0.3, T3-T4 normal, thyroid ultrasound showed 1 nodule, referred to endocrinologist, states has an appointment in June    RTC 1 month

## 2022-09-12 NOTE — Assessment & Plan Note (Addendum)
Edema: Gradual development of lower extremity edema,  pioglitazone dose increased to 45 mg on 12-2021.  Overall suspect actos is more likely than amlodipine to be the cause of edema.  Plan: Stop pioglitazone, continue Lasix.  Low-salt diet, leg elevation.  Reassess soon. DM, A1c February 2024 was 7.0.  Slightly improved compared to previous A1c.  Currently on Januvia and pioglitazone.  Stopping pioglitazone.  Start Jardiance 10 mg.  Watch for side effects.  If unable to afford consider glipizide HTN: BP looks very good, continue amlodipine 2.5 mg, Lasix, losartan.. Subclinical hypothyroidism: Last TSH slightly suppressed at 0.3, T3-T4 normal, thyroid ultrasound showed 1 nodule, referred to endocrinologist, states has an appointment in June    RTC 1 month

## 2022-09-13 ENCOUNTER — Ambulatory Visit: Payer: Medicare Other

## 2022-09-22 ENCOUNTER — Ambulatory Visit (INDEPENDENT_AMBULATORY_CARE_PROVIDER_SITE_OTHER): Payer: Medicare Other | Admitting: *Deleted

## 2022-09-22 VITALS — Ht 65.0 in | Wt 253.0 lb

## 2022-09-22 DIAGNOSIS — E1165 Type 2 diabetes mellitus with hyperglycemia: Secondary | ICD-10-CM | POA: Diagnosis not present

## 2022-09-22 DIAGNOSIS — Z0000 Encounter for general adult medical examination without abnormal findings: Secondary | ICD-10-CM

## 2022-09-22 DIAGNOSIS — E041 Nontoxic single thyroid nodule: Secondary | ICD-10-CM | POA: Diagnosis not present

## 2022-09-22 DIAGNOSIS — I10 Essential (primary) hypertension: Secondary | ICD-10-CM | POA: Diagnosis not present

## 2022-09-22 DIAGNOSIS — N183 Chronic kidney disease, stage 3 unspecified: Secondary | ICD-10-CM | POA: Diagnosis not present

## 2022-09-22 DIAGNOSIS — E559 Vitamin D deficiency, unspecified: Secondary | ICD-10-CM | POA: Diagnosis not present

## 2022-09-22 DIAGNOSIS — E0590 Thyrotoxicosis, unspecified without thyrotoxic crisis or storm: Secondary | ICD-10-CM | POA: Diagnosis not present

## 2022-09-22 NOTE — Progress Notes (Signed)
Subjective:   Donald Jones is a 77 y.o. male who presents for Medicare Annual/Subsequent preventive examination.  I connected with  Donald Jones on 09/22/22 by a audio enabled telemedicine application and verified that I am speaking with the correct person using two identifiers.  Patient Location: Home  Provider Location: Office/Clinic  I discussed the limitations of evaluation and management by telemedicine. The patient expressed understanding and agreed to proceed.   Review of Systems     Cardiac Risk Factors include: advanced age (>42men, >21 women);male gender;diabetes mellitus;dyslipidemia;obesity (BMI >30kg/m2);hypertension     Objective:    Today's Vitals   09/22/22 1428  Weight: 253 lb (114.8 kg)  Height: 5\' 5"  (1.651 m)   Body mass index is 42.1 kg/m.     09/22/2022    2:16 PM 09/06/2021    1:04 PM 07/24/2019   11:00 PM 07/04/2019    5:57 AM 05/27/2019    5:33 PM 04/03/2018    9:44 AM 08/10/2017    8:29 AM  Advanced Directives  Does Patient Have a Medical Advance Directive? Yes No No No No No Yes  Type of Advance Directive Living will        Does patient want to make changes to medical advance directive?       No - Patient declined  Would patient like information on creating a medical advance directive?  No - Patient declined Yes (Inpatient - patient requests chaplain consult to create a medical advance directive) No - Patient declined No - Patient declined No - Patient declined     Current Medications (verified) Outpatient Encounter Medications as of 09/22/2022  Medication Sig   Accu-Chek FastClix Lancets MISC Use to check blood glucose once a day (Dx: type 2 DM - E 11.9)   albuterol (VENTOLIN HFA) 108 (90 Base) MCG/ACT inhaler INHALE 2 PUFFS INTO THE LUNGS EVERY 6 HOURS AS NEEDED FOR WHEEZING OR SHORTNESS OF BREATH.   AMBULATORY NON FORMULARY MEDICATION GI Cocktail - 90 mls of 2% Lidocaine                      90 mls Dicyclomine 10mg /89ml                     270 mls Maalox Take 5-10 ml every 4-6 hours as needed (Patient not taking: Reported on 09/04/2022)   amLODipine (NORVASC) 2.5 MG tablet TAKE 1 TABLET (2.5 MG TOTAL) BY MOUTH DAILY.   aspirin EC 81 MG tablet Take 81 mg by mouth daily. Each morning. Swallow whole.   atorvastatin (LIPITOR) 40 MG tablet TAKE 1 TABLET BY MOUTH AT BEDTIME.   BREO ELLIPTA 100-25 MCG/ACT AEPB Inhale 1 puff into the lungs daily.   Caraway Oil-Levomenthol (FDGARD) 25-20.75 MG CAPS Take 2 tablets by mouth in the morning and at bedtime.    diclofenac sodium (VOLTAREN) 1 % GEL Apply 4 g topically 3 (three) times daily as needed.   dicyclomine (BENTYL) 20 MG tablet Take 1 tablet (20 mg total) by mouth 3 (three) times daily before meals.   empagliflozin (JARDIANCE) 10 MG TABS tablet Take 1 tablet (10 mg total) by mouth daily before breakfast.   escitalopram (LEXAPRO) 10 MG tablet Take 1 tablet (10 mg total) by mouth daily.   famotidine (PEPCID) 20 MG tablet Take 1 tablet (20 mg total) by mouth 2 (two) times daily.   Flaxseed, Linseed, (FLAXSEED OIL) 1000 MG CAPS Take 2,000 mg by mouth daily.  fluticasone (FLONASE) 50 MCG/ACT nasal spray Place 1 spray into both nostrils daily.   furosemide (LASIX) 40 MG tablet TAKE 1 TABLET (40 MG TOTAL) BY MOUTH DAILY. (Patient taking differently: Take 30 mg by mouth daily.)   glucose blood (ACCU-CHEK GUIDE) test strip Use to check blood glucose once a day (Dx: type 2 DM - E 11.9)   hydrocortisone 2.5 % cream Apply topically 2 (two) times daily.   ketoconazole (NIZORAL) 2 % cream Apply topically daily.   losartan (COZAAR) 100 MG tablet Take 1 tablet (100 mg total) by mouth daily.   meclizine (ANTIVERT) 12.5 MG tablet Take 1 tablet (12.5 mg total) by mouth 3 (three) times daily as needed for dizziness.   Multiple Vitamin (MULTIVITAMIN) tablet Take 1 tablet by mouth daily.   omeprazole (PRILOSEC) 20 MG capsule TAKE 1 CAPSULE (20 MG TOTAL) BY MOUTH DAILY.   pioglitazone (ACTOS) 30 MG tablet  Take 1 tablet (30 mg total) by mouth daily.   sildenafil (REVATIO) 20 MG tablet TAKE 3 TO 4 TABLETS BY MOUTH AT BEDTIME AS NEEDED.   sitaGLIPtin (JANUVIA) 100 MG tablet Take 1 tablet (100 mg total) by mouth daily.   No facility-administered encounter medications on file as of 09/22/2022.    Allergies (verified) Metformin and related and Sulfa antibiotics   History: Past Medical History:  Diagnosis Date   Anxiety    Arthritis    hands right   Diabetes mellitus without complication (HCC)    GERD (gastroesophageal reflux disease)    Hypertension    Kidney stones    several episodes.  Last one in 2011.     Obesity    100-lb weight loss since 2011.    Polycythemia    Sleep apnea    on Cpap   Past Surgical History:  Procedure Laterality Date   CHOLECYSTECTOMY     HAND SURGERY  2010   R hand , d/t a injury, 3 surgeries    Family History  Problem Relation Age of Onset   Diabetes Mother    Coronary artery disease Mother 23   Stroke Father        M and F   Hypertension Father    Heart disease Sister        ?   Alcohol abuse Sister    Colon cancer Sister 60       age ~ 14   Prostate cancer Neg Hx    Stomach cancer Neg Hx    Rectal cancer Neg Hx    Esophageal cancer Neg Hx    Social History   Socioeconomic History   Marital status: Married    Spouse name: Not on file   Number of children: Not on file   Years of education: Not on file   Highest education level: Not on file  Occupational History   Occupation: RETIREd ---[employer], 2021  Tobacco Use   Smoking status: Never   Smokeless tobacco: Never  Vaping Use   Vaping Use: Never used  Substance and Sexual Activity   Alcohol use: No    Comment: none for 15 years   Drug use: No   Sexual activity: Not on file  Other Topics Concern   Not on file  Social History Narrative   1ST WIFE DIED FROM LUNG CANCER, 2ND WIFE DIED FROM RENAL CANCER.     Married x 4 , lives w/ wife   3 sisters and 3 brother : lost 2  sisters    Social Determinants of  Health   Financial Resource Strain: Medium Risk (02/16/2022)   Overall Financial Resource Strain (CARDIA)    Difficulty of Paying Living Expenses: Somewhat hard  Food Insecurity: No Food Insecurity (09/22/2022)   Hunger Vital Sign    Worried About Running Out of Food in the Last Year: Never true    Ran Out of Food in the Last Year: Never true  Transportation Needs: No Transportation Needs (09/22/2022)   PRAPARE - Administrator, Civil Service (Medical): No    Lack of Transportation (Non-Medical): No  Physical Activity: Unknown (09/06/2021)   Exercise Vital Sign    Days of Exercise per Week: 0 days    Minutes of Exercise per Session: Not on file  Stress: Stress Concern Present (03/02/2022)   [employer] of Occupational Health - Occupational Stress Questionnaire    Feeling of Stress : To some extent  Social Connections: Moderately Integrated (09/06/2021)   Social Connection and Isolation Panel [NHANES]    Frequency of Communication with Friends and Family: More than three times a week    Frequency of Social Gatherings with Friends and Family: More than three times a week    Attends Religious Services: More than 4 times per year    Active Member of [employer] or Organizations: No    Attends Engineer, structural: Never    Marital Status: Married    Tobacco Counseling Counseling given: Not Answered   Clinical Intake:  Pre-visit preparation completed: Yes  Pain : No/denies pain  BMI - recorded: 42.1 Nutritional Status: BMI > 30  Obese Nutritional Risks: None Diabetes: Yes CBG done?: No Did pt. bring in CBG monitor from home?: No  How often do you need to have someone help you when you read instructions, pamphlets, or other written materials from your doctor or pharmacy?: 1 - Never   Activities of Daily Living    09/22/2022    2:19 PM  In your present state of health, do you have any difficulty performing the following  activities:  Hearing? 0  Vision? 0  Difficulty concentrating or making decisions? 1  Comment remembering  Walking or climbing stairs? 1  Dressing or bathing? 0  Doing errands, shopping? 0  Preparing Food and eating ? N  Using the Toilet? N  In the past six months, have you accidently leaked urine? Y  Do you have problems with loss of bowel control? N  Managing your Medications? N  Managing your Finances? N  Housekeeping or managing your Housekeeping? N    Patient Care Team: Wanda Plump, MD as PCP - General (Internal Medicine) Corky Crafts, MD as PCP - Cardiology (Cardiology) Artis Delay, MD as Consulting Physician (Hematology and Oncology) Erenest Blank, NP as Nurse Practitioner (Nurse Practitioner) Ernesto Rutherford, MD as Referring Physician (Ophthalmology)  Indicate any recent Medical Services you may have received from other than [facility] providers in the past year (date may be approximate).     Assessment:   This is a routine wellness examination for Dean.  Hearing/Vision screen No results found.  Dietary issues and exercise activities discussed: Current Exercise Habits: The patient does not participate in regular exercise at present, Exercise limited by: orthopedic condition(s)   Goals Addressed   None    Depression Screen    09/22/2022    2:18 PM 09/12/2022   10:31 AM 06/06/2022    8:59 AM 02/03/2022    9:54 AM 09/06/2021    1:05 PM 07/06/2021    1:57  PM 04/06/2021    2:48 PM  PHQ 2/9 Scores  PHQ - 2 Score 0 0 0 0 0 0 0    Fall Risk    09/22/2022    2:16 PM 09/12/2022   10:30 AM 06/06/2022    8:59 AM 02/03/2022    9:54 AM 09/06/2021    1:05 PM  Fall Risk   Falls in the past year? 0 1 0 0 0  Number falls in past yr: 0 0 0 0 0  Injury with Fall? 0 0 0 0 0  Risk for fall due to : No Fall Risks    No Fall Risks  Follow up Falls evaluation completed Falls evaluation completed Falls evaluation completed Falls evaluation completed Falls evaluation completed     FALL RISK PREVENTION PERTAINING TO THE HOME:  Any stairs in or around the home? Yes  If so, are there any without handrails? No  Home free of loose throw rugs in walkways, pet beds, electrical cords, etc? Yes  Adequate lighting in your home to reduce risk of falls? Yes   ASSISTIVE DEVICES UTILIZED TO PREVENT FALLS:  Life alert? No  Use of a cane, walker or w/c? Yes  Grab bars in the bathroom? No  Shower chair or bench in shower? Yes  Elevated toilet seat or a handicapped toilet? Yes   TIMED UP AND GO:  Was the test performed?  No, audio visit .    Cognitive Function:        09/22/2022    2:24 PM 09/06/2021    1:15 PM  6CIT Screen  What Year? 0 points 0 points  What month? 0 points 0 points  What time? 3 points 0 points  Count back from 20 4 points 0 points  Months in reverse 2 points 0 points  Repeat phrase 0 points 2 points  Total Score 9 points 2 points    Immunizations Immunization History  Administered Date(s) Administered   COVID-19, mRNA, vaccine(Comirnaty)12 years and older 06/09/2022   Fluad Quad(high Dose 65+) 01/10/2019, 01/23/2020, 02/03/2022   Influenza Split 01/17/2011, 01/16/2012   Influenza Whole 02/18/2007   Influenza, High Dose Seasonal PF 01/22/2015, 02/03/2016, 01/29/2017, 01/09/2018   Influenza,inj,Quad PF,6+ Mos 02/05/2013, 01/27/2014, 03/28/2019   Influenza-Unspecified 01/15/2021   PFIZER Comirnaty(Gray Top)Covid-19 Tri-Sucrose Vaccine 09/24/2020   PFIZER(Purple Top)SARS-COV-2 Vaccination 04/12/2019, 06/03/2019, 04/07/2020   PNEUMOCOCCAL CONJUGATE-20 02/03/2022   Pneumococcal Conjugate-13 01/22/2015   Pneumococcal Polysaccharide-23 01/16/2003, 12/03/2012   Respiratory Syncytial Virus Vaccine,Recomb Aduvanted(Arexvy) 02/06/2022   Tdap 12/03/2012, 05/22/2016   Zoster Recombinat (Shingrix) 01/29/2018, 05/23/2018   Zoster, Live 02/09/2010    TDAP status: Up to date  Flu Vaccine status: Up to date  Pneumococcal vaccine status: Up to  date  Covid-19 vaccine status: Information provided on how to obtain vaccines.   Qualifies for Shingles Vaccine? Yes   Zostavax completed Yes   Shingrix Completed?: Yes  Screening Tests Health Maintenance  Topic Date Due   FOOT EXAM  05/24/2022   Medicare Annual Wellness (AWV)  09/07/2022   COVID-19 Vaccine (6 - 2023-24 season) 03/15/2023 (Originally 08/04/2022)   INFLUENZA VACCINE  11/16/2022   OPHTHALMOLOGY EXAM  02/17/2023   HEMOGLOBIN A1C  03/15/2023   Diabetic kidney evaluation - Urine ACR  06/07/2023   Diabetic kidney evaluation - eGFR measurement  09/12/2023   DTaP/Tdap/Td (3 - Td or Tdap) 05/22/2026   Colonoscopy  09/19/2027   Pneumonia Vaccine 52+ Years old  Completed   Hepatitis C Screening  Completed   Zoster  Vaccines- Shingrix  Completed   HPV VACCINES  Aged Out    Health Maintenance  Health Maintenance Due  Topic Date Due   FOOT EXAM  05/24/2022   Medicare Annual Wellness (AWV)  09/07/2022    Colorectal cancer screening: Type of screening: Colonoscopy. Completed 09/18/17. Repeat every 10 years  Lung Cancer Screening: (Low Dose CT Chest recommended if Age 52-80 years, 30 pack-year currently smoking OR have quit w/in 15years.) does not qualify.    Additional Screening:  Hepatitis C Screening: does qualify; Completed 05/25/21  Vision Screening: Recommended annual ophthalmology exams for early detection of glaucoma and other disorders of the eye. Is the patient up to date with their annual eye exam?  Yes  Who is the provider or what is the name of the office in which the patient attends annual eye exams? [facility] If pt is not established with a provider, would they like to be referred to a provider to establish care? No .   Dental Screening: Recommended annual dental exams for proper oral hygiene  Community Resource Referral / Chronic Care Management: CRR required this visit?  No   CCM required this visit?  No      Plan:     I have personally  reviewed and noted the following in the patient's chart:   Medical and social history Use of alcohol, tobacco or illicit drugs  Current medications and supplements including opioid prescriptions. Patient is not currently taking opioid prescriptions. Functional ability and status Nutritional status Physical activity Advanced directives List of other physicians Hospitalizations, surgeries, and ER visits in previous 12 months Vitals Screenings to include cognitive, depression, and falls Referrals and appointments  In addition, I have reviewed and discussed with patient certain preventive protocols, quality metrics, and best practice recommendations. A written personalized care plan for preventive services as well as general preventive health recommendations were provided to patient.   Due to this being a telephonic visit, the after visit summary with patients personalized plan was offered to patient via mail or my-chart. Patient would like to access on my-chart.  Donne Anon, [place]   09/22/2022   Nurse Notes: None

## 2022-09-22 NOTE — Patient Instructions (Signed)
Donald Jones , Thank you for taking time to come for your Medicare Wellness Visit. I appreciate your ongoing commitment to your health goals. Please review the following plan we discussed and let me know if I can assist you in the future.   These are the goals we discussed:  Goals   None     This is a list of the screening recommended for you and due dates:  Health Maintenance  Topic Date Due   Complete foot exam   05/24/2022   COVID-19 Vaccine (6 - 2023-24 season) 03/15/2023*   Flu Shot  11/16/2022   Eye exam for diabetics  02/17/2023   Hemoglobin A1C  03/15/2023   Yearly kidney health urinalysis for diabetes  06/07/2023   Yearly kidney function blood test for diabetes  09/12/2023   Medicare Annual Wellness Visit  09/22/2023   DTaP/Tdap/Td vaccine (3 - Td or Tdap) 05/22/2026   Colon Cancer Screening  09/19/2027   Pneumonia Vaccine  Completed   Hepatitis C Screening  Completed   Zoster (Shingles) Vaccine  Completed   HPV Vaccine  Aged Out  *Topic was postponed. The date shown is not the original due date.     Next appointment: Follow up in one year for your annual wellness visit.   Preventive Care 32 Years and Older, Male Preventive care refers to lifestyle choices and visits with your health care provider that can promote health and wellness. What does preventive care include? A yearly physical exam. This is also called an annual well check. Dental exams once or twice a year. Routine eye exams. Ask your health care provider how often you should have your eyes checked. Personal lifestyle choices, including: Daily care of your teeth and gums. Regular physical activity. Eating a healthy diet. Avoiding tobacco and drug use. Limiting alcohol use. Practicing safe sex. Taking low doses of aspirin every day. Taking vitamin and mineral supplements as recommended by your health care provider. What happens during an annual well check? The services and screenings done by your  health care provider during your annual well check will depend on your age, overall health, lifestyle risk factors, and family history of disease. Counseling  Your health care provider may ask you questions about your: Alcohol use. Tobacco use. Drug use. Emotional well-being. Home and relationship well-being. Sexual activity. Eating habits. History of falls. Memory and ability to understand (cognition). Work and work Astronomer. Screening  You may have the following tests or measurements: Height, weight, and BMI. Blood pressure. Lipid and cholesterol levels. These may be checked every 5 years, or more frequently if you are over 61 years old. Skin check. Lung cancer screening. You may have this screening every year starting at age 1 if you have a 30-pack-year history of smoking and currently smoke or have quit within the past 15 years. Fecal occult blood test (FOBT) of the stool. You may have this test every year starting at age 83. Flexible sigmoidoscopy or colonoscopy. You may have a sigmoidoscopy every 5 years or a colonoscopy every 10 years starting at age 68. Prostate cancer screening. Recommendations will vary depending on your family history and other risks. Hepatitis C blood test. Hepatitis B blood test. Sexually transmitted disease (STD) testing. Diabetes screening. This is done by checking your blood sugar (glucose) after you have not eaten for a while (fasting). You may have this done every 1-3 years. Abdominal aortic aneurysm (AAA) screening. You may need this if you are a current or former smoker. Osteoporosis.  You may be screened starting at age 47 if you are at high risk. Talk with your health care provider about your test results, treatment options, and if necessary, the need for more tests. Vaccines  Your health care provider may recommend certain vaccines, such as: Influenza vaccine. This is recommended every year. Tetanus, diphtheria, and acellular pertussis  (Tdap, Td) vaccine. You may need a Td booster every 10 years. Zoster vaccine. You may need this after age 25. Pneumococcal 13-valent conjugate (PCV13) vaccine. One dose is recommended after age 41. Pneumococcal polysaccharide (PPSV23) vaccine. One dose is recommended after age 28. Talk to your health care provider about which screenings and vaccines you need and how often you need them. This information is not intended to replace advice given to you by your health care provider. Make sure you discuss any questions you have with your health care provider. Document Released: 04/30/2015 Document Revised: 12/22/2015 Document Reviewed: 02/02/2015 Elsevier Interactive Patient Education  2017 [employer] Prevention in the Home Falls can cause injuries. They can happen to people of all ages. There are many things you can do to make your home safe and to help prevent falls. What can I do on the outside of my home? Regularly fix the edges of walkways and driveways and fix any cracks. Remove anything that might make you trip as you walk through a door, such as a raised step or threshold. Trim any bushes or trees on the path to your home. Use bright outdoor lighting. Clear any walking paths of anything that might make someone trip, such as rocks or tools. Regularly check to see if handrails are loose or broken. Make sure that both sides of any steps have handrails. Any raised decks and porches should have guardrails on the edges. Have any leaves, snow, or ice cleared regularly. Use sand or salt on walking paths during winter. Clean up any spills in your garage right away. This includes oil or grease spills. What can I do in the bathroom? Use night lights. Install grab bars by the toilet and in the tub and shower. Do not use towel bars as grab bars. Use non-skid mats or decals in the tub or shower. If you need to sit down in the shower, use a plastic, non-slip stool. Keep the floor dry. Clean  up any water that spills on the floor as soon as it happens. Remove soap buildup in the tub or shower regularly. Attach bath mats securely with double-sided non-slip rug tape. Do not have throw rugs and other things on the floor that can make you trip. What can I do in the bedroom? Use night lights. Make sure that you have a light by your bed that is easy to reach. Do not use any sheets or blankets that are too big for your bed. They should not hang down onto the floor. Have a firm chair that has side arms. You can use this for support while you get dressed. Do not have throw rugs and other things on the floor that can make you trip. What can I do in the kitchen? Clean up any spills right away. Avoid walking on wet floors. Keep items that you use a lot in easy-to-reach places. If you need to reach something above you, use a strong step stool that has a grab bar. Keep electrical cords out of the way. Do not use floor polish or wax that makes floors slippery. If you must use wax, use non-skid floor wax.  Do not have throw rugs and other things on the floor that can make you trip. What can I do with my stairs? Do not leave any items on the stairs. Make sure that there are handrails on both sides of the stairs and use them. Fix handrails that are broken or loose. Make sure that handrails are as long as the stairways. Check any carpeting to make sure that it is firmly attached to the stairs. Fix any carpet that is loose or worn. Avoid having throw rugs at the top or bottom of the stairs. If you do have throw rugs, attach them to the floor with carpet tape. Make sure that you have a light switch at the top of the stairs and the bottom of the stairs. If you do not have them, ask someone to add them for you. What else can I do to help prevent falls? Wear shoes that: Do not have high heels. Have rubber bottoms. Are comfortable and fit you well. Are closed at the toe. Do not wear sandals. If you  use a stepladder: Make sure that it is fully opened. Do not climb a closed stepladder. Make sure that both sides of the stepladder are locked into place. Ask someone to hold it for you, if possible. Clearly mark and make sure that you can see: Any grab bars or handrails. First and last steps. Where the edge of each step is. Use tools that help you move around (mobility aids) if they are needed. These include: Canes. Walkers. Scooters. Crutches. Turn on the lights when you go into a dark area. Replace any light bulbs as soon as they burn out. Set up your furniture so you have a clear path. Avoid moving your furniture around. If any of your floors are uneven, fix them. If there are any pets around you, be aware of where they are. Review your medicines with your doctor. Some medicines can make you feel dizzy. This can increase your chance of falling. Ask your doctor what other things that you can do to help prevent falls. This information is not intended to replace advice given to you by your health care provider. Make sure you discuss any questions you have with your health care provider. Document Released: 01/28/2009 Document Revised: 09/09/2015 Document Reviewed: 05/08/2014 Elsevier Interactive Patient Education  2017 [employer].

## 2022-09-26 DIAGNOSIS — E559 Vitamin D deficiency, unspecified: Secondary | ICD-10-CM | POA: Diagnosis not present

## 2022-09-26 DIAGNOSIS — E1165 Type 2 diabetes mellitus with hyperglycemia: Secondary | ICD-10-CM | POA: Diagnosis not present

## 2022-09-26 DIAGNOSIS — E0590 Thyrotoxicosis, unspecified without thyrotoxic crisis or storm: Secondary | ICD-10-CM | POA: Diagnosis not present

## 2022-09-28 ENCOUNTER — Encounter (HOSPITAL_COMMUNITY): Payer: Self-pay | Admitting: Internal Medicine

## 2022-09-28 ENCOUNTER — Encounter: Payer: Self-pay | Admitting: Internal Medicine

## 2022-09-29 ENCOUNTER — Other Ambulatory Visit (HOSPITAL_COMMUNITY): Payer: Self-pay | Admitting: Internal Medicine

## 2022-09-29 ENCOUNTER — Other Ambulatory Visit: Payer: Self-pay | Admitting: Internal Medicine

## 2022-09-29 DIAGNOSIS — E041 Nontoxic single thyroid nodule: Secondary | ICD-10-CM

## 2022-10-05 ENCOUNTER — Other Ambulatory Visit (HOSPITAL_BASED_OUTPATIENT_CLINIC_OR_DEPARTMENT_OTHER): Payer: Self-pay

## 2022-10-05 ENCOUNTER — Encounter: Payer: Self-pay | Admitting: Gastroenterology

## 2022-10-06 ENCOUNTER — Encounter: Payer: Self-pay | Admitting: Podiatry

## 2022-10-06 ENCOUNTER — Ambulatory Visit: Payer: Medicare Other | Admitting: Podiatry

## 2022-10-06 VITALS — BP 163/81

## 2022-10-06 DIAGNOSIS — B351 Tinea unguium: Secondary | ICD-10-CM | POA: Insufficient documentation

## 2022-10-06 DIAGNOSIS — M79674 Pain in right toe(s): Secondary | ICD-10-CM

## 2022-10-06 DIAGNOSIS — M79675 Pain in left toe(s): Secondary | ICD-10-CM | POA: Diagnosis not present

## 2022-10-06 DIAGNOSIS — I872 Venous insufficiency (chronic) (peripheral): Secondary | ICD-10-CM | POA: Diagnosis not present

## 2022-10-06 NOTE — Progress Notes (Signed)
This patient presents to the office with chief complaint of long thick painful nails.  Patient says the nails are painful walking and wearing shoes.  This patient is unable to self treat.  This patient is unable to trim his nails since he is unable to reach his nails.  He is diagnosed with venous stasis.  he presents to the office for preventative foot care services.  General Appearance  Alert, conversant and in no acute stress.  Vascular  Dorsalis pedis and posterior tibial  pulses are absent  bilaterally.  Capillary return is within normal limits  bilaterally. Temperature is within normal limits  bilaterally.  Neurologic  Senn-Weinstein monofilament wire test within normal limits  bilaterally. Muscle power within normal limits bilaterally.  Nails Thick disfigured discolored nails with subungual debris  from hallux to fifth toes bilaterally. No evidence of bacterial infection or drainage bilaterally.  Orthopedic  No limitations of motion  feet .  No crepitus or effusions noted.  No bony pathology or digital deformities noted.  Skin  normotropic skin with no porokeratosis noted bilaterally.  No signs of infections or ulcers noted.     Onychomycosis  Nails  B/L.  Pain in right toes  Pain in left toes  Debridement of nails both feet followed trimming the nails with dremel tool.    RTC 3 months.   Helane Gunther DPM

## 2022-10-11 ENCOUNTER — Telehealth: Payer: Self-pay | Admitting: Pharmacist

## 2022-10-11 NOTE — Telephone Encounter (Signed)
Diane (spouse) called stating that Ccm appt would have to Rs'd as he has another appt.

## 2022-10-12 ENCOUNTER — Other Ambulatory Visit (INDEPENDENT_AMBULATORY_CARE_PROVIDER_SITE_OTHER): Payer: Medicare Other | Admitting: Pharmacist

## 2022-10-12 DIAGNOSIS — E119 Type 2 diabetes mellitus without complications: Secondary | ICD-10-CM

## 2022-10-12 DIAGNOSIS — J4520 Mild intermittent asthma, uncomplicated: Secondary | ICD-10-CM

## 2022-10-12 NOTE — Progress Notes (Signed)
Pharmacy Note  10/12/2022 Name: Donald Jones MRN: [id number] DOB: [birth year]  Subjective: Donald Jones is a 76 y.o. year old male who is a primary care patient of Wanda Plump, MD. Clinical Pharmacist Practitioner referral was placed to assist with medication management   Engaged with patient's wife and his pharmacy  for  assessment for medication assistance program for [facility]  today.    Asthma:  Patient is taking Breo - he usually reached Medicare coverage gap and then cost is very high.  He currently is still paying $47 / month for Breo.   He has qualified for Januvia medication assistance program for 2024.  He is also taking Jardiance 10mg  daily which was started 07/2022.   Objective: Review of patient status, including review of consultants reports, laboratory and other test data, was performed as part of comprehensive evaluation and provision of chronic care management services.   Lab Results  Component Value Date   CREATININE 1.32 09/12/2022   CREATININE 1.41 06/06/2022   CREATININE 1.40 02/03/2022    Lab Results  Component Value Date   HGBA1C 7.1 (H) 09/12/2022       Component Value Date/Time   CHOL 103 06/06/2022 0929   TRIG 47.0 06/06/2022 0929   HDL 48.30 06/06/2022 0929   CHOLHDL 2 06/06/2022 0929   VLDL 9.4 06/06/2022 0929   LDLCALC 45 06/06/2022 0929   LDLCALC 49 08/06/2020 1406     Clinical ASCVD: Type 2 DM The ASCVD Risk score (Arnett DK, et al., 2019) failed to calculate for the following reasons:   The valid total cholesterol range is 130 to 320 mg/dL    BP Readings from Last 3 Encounters:  10/06/22 (!) 163/81  09/12/22 136/66  08/28/22 130/68     Allergies  Allergen Reactions   Metformin And Related     Intolerant, GI symptoms   Sulfa Antibiotics Nausea And Vomiting    Medications Reviewed Today     Reviewed by Henrene Pastor, RPH-CPP (Pharmacist) on 10/12/22 at 1207  Med List Status: <None>   Medication Order Taking? Sig  Documenting Provider Last Dose Status Informant  Accu-Chek FastClix Lancets MISC [id number] No Use to check blood glucose once a day (Dx: type 2 DM - E 11.9) Paz, Nolon Rod, MD Taking Active   albuterol (VENTOLIN HFA) 108 (90 Base) MCG/ACT inhaler [id number] No INHALE 2 PUFFS INTO THE LUNGS EVERY 6 HOURS AS NEEDED FOR WHEEZING OR SHORTNESS OF BREATH. Wanda Plump, MD Taking Active   AMBULATORY NON FORMULARY MEDICATION [id number] No GI Cocktail - 90 mls of 2% Lidocaine                      90 mls Dicyclomine 10mg /34ml                    270 mls Maalox Take 5-10 ml every 4-6 hours as needed Barrett, Joline Salt, PA-C Taking Active            Med Note Clydie Braun, Odyssey Vasbinder B   Thu Feb 16, 2022  4:36 PM) Hasn't used recently   amLODipine (NORVASC) 2.5 MG tablet [id number] No TAKE 1 TABLET (2.5 MG TOTAL) BY MOUTH DAILY. Saguier, Ramon Dredge, PA-C Taking Active   aspirin EC 81 MG tablet [id number] No Take 81 mg by mouth daily. Each morning. Swallow whole. [provider] Taking Active   atorvastatin (LIPITOR) 40 MG tablet [id number] No Take 1 tablet (40 mg total) by mouth at  bedtime. Wanda Plump, MD Taking Active   BREO ELLIPTA 100-25 MCG/ACT AEPB [id number] No Inhale 1 puff into the lungs daily. Wanda Plump, MD Taking Active   Caraway Oil-Levomenthol (FDGARD) 25-20.75 MG CAPS [id number] No Take 2 tablets by mouth in the morning and at bedtime.  [provider] Taking Active Self  diclofenac sodium (VOLTAREN) 1 % GEL [id number] No Apply 4 g topically 3 (three) times daily as needed. Wanda Plump, MD Taking Active Self  dicyclomine (BENTYL) 20 MG tablet [id number] No Take 1 tablet (20 mg total) by mouth 3 (three) times daily before meals. Wanda Plump, MD Taking Active   empagliflozin (JARDIANCE) 10 MG TABS tablet [id number] No Take 1 tablet (10 mg total) by mouth daily before breakfast. Wanda Plump, MD Taking Active   escitalopram (LEXAPRO) 10 MG tablet [id number] No Take 1 tablet (10 mg total) by mouth daily. Wanda Plump, MD Taking Active   famotidine (PEPCID) 20 MG tablet [id number] No Take 1 tablet (20 mg total) by mouth 2 (two) times daily. Wanda Plump, MD Taking Active   Flaxseed, Linseed, (FLAXSEED OIL) 1000 MG CAPS [id number] No Take 2,000 mg by mouth daily.  [provider] Taking Active Self  fluticasone (FLONASE) 50 MCG/ACT nasal spray [id number] No Place 1 spray into both nostrils daily. Wanda Plump, MD Taking Active   furosemide (LASIX) 40 MG tablet [id number] No TAKE 1 TABLET (40 MG TOTAL) BY MOUTH DAILY.  Patient taking differently: Take 30 mg by mouth daily.   Wanda Plump, MD Taking Active   glucose blood (ACCU-CHEK GUIDE) test strip [id number] No Use to check blood glucose once a day (Dx: type 2 DM - E 11.9) Wanda Plump, MD Taking Active   hydrocortisone 2.5 % cream [id number] No Apply topically 2 (two) times daily. Wanda Plump, MD Taking Active Self  ketoconazole (NIZORAL) 2 % cream [id number] No Apply topically daily. Wanda Plump, MD Taking Active   losartan (COZAAR) 100 MG tablet [id number] No Take 1 tablet (100 mg total) by mouth daily. Wanda Plump, MD Taking Active   meclizine (ANTIVERT) 12.5 MG tablet [id number] No Take 1 tablet (12.5 mg total) by mouth 3 (three) times daily as needed for dizziness. Saguier, Ramon Dredge, PA-C Taking Active   Multiple Vitamin (MULTIVITAMIN) tablet [id number] No Take 1 tablet by mouth daily. [provider] Taking Active Self  omeprazole (PRILOSEC) 20 MG capsule [id number] No TAKE 1 CAPSULE (20 MG TOTAL) BY MOUTH DAILY. Wanda Plump, MD Taking Active   pioglitazone (ACTOS) 30 MG tablet [id number] No Take 1 tablet (30 mg total) by mouth daily. Wanda Plump, MD Taking Active   sildenafil (REVATIO) 20 MG tablet [id number] No TAKE 3 TO 4 TABLETS BY MOUTH AT BEDTIME AS NEEDED. Wanda Plump, MD Taking Active   sitaGLIPtin (JANUVIA) 100 MG tablet [id number] No Take 1 tablet (100 mg total) by mouth daily. Wanda Plump, MD Taking Active            Med Note Clydie Braun, Cindie Laroche Jul 06, 2022  4:13 PM) Approved for [employer] medication assistance program 2024            Patient Active Problem List   Diagnosis Date Noted   Pain due to onychomycosis of toenails of both feet 10/06/2022   Chronic stasis dermatitis 11/06/2021   Chronic dermatitis- legs 09/01/2021   Asthma 04/06/2021   Anxiety 07/07/2019   High  cholesterol 01/20/2018   DJD (degenerative joint disease) 09/06/2017   Erectile dysfunction 09/06/2017   PCP NOTES >>> 01/23/2015   Subclinical hyperthyroidism 10/05/2014   Diabetes mellitus without complication (HCC) 10/05/2014   Morbid obesity (HCC) 12/03/2012   GERD (gastroesophageal reflux disease) 09/24/2012   IBS ? 04/22/2012   Polycythemia    Annual physical exam 06/05/2011   Foot drop, right 06/05/2011   HYPERTENSION, BENIGN SYSTEMIC 06/14/2006   Allergic rhinitis 06/14/2006   NEPHROLITHIASIS 06/14/2006   OSA (obstructive sleep apnea) 06/14/2006     Medication Assistance:    Januvia obtained through [employer]  medication assistance program.  Enrollment ends 04/17/2023 ; Patient was able to get Breo thru patient assistance program at the end of 2023 but [employer] patient assistance program requires patient to spend $600 out of pocket for current year to apply to their program. Patient's current out of pocket per [facility] is $481.28 (will fill Breo for $47 next week so total will be $528.28 after Breo refill)   Assessment / Plan: Type 2 DM -  Continue Januvia 100mg  daily, Jardiance 10mg  daily and pioglitazone 30mg  daily  Assessed for medication assistance program for Jardiance but household income was above cut off for medication assistance program for either [place] or [place].   Asthma Continue Breo 1 puff daily.  Once patient reaches $600 out of pocket spent on medications for 2024 will apply for [employer] medication assistance program for Breo.    Follow Up:  1 to 2 months.    Henrene Pastor, PharmD Clinical Pharmacist [facility]   - [facility] [phone]

## 2022-10-12 NOTE — Telephone Encounter (Signed)
Called wife regarding follow up appointment. He will see PCP 10/18/2022. Will check to see how much he has spend out of pocket for 2024 from his pharmacy. If needed will discuss further on Wen 10/18/2022 when patient is in office.

## 2022-10-16 ENCOUNTER — Encounter (HOSPITAL_COMMUNITY)
Admission: RE | Admit: 2022-10-16 | Discharge: 2022-10-16 | Disposition: A | Discharge status: Home / Self Care | Payer: Medicare Other | Source: Ambulatory Visit | Attending: Internal Medicine | Admitting: Internal Medicine

## 2022-10-16 ENCOUNTER — Telehealth: Payer: Medicare Other

## 2022-10-16 DIAGNOSIS — E041 Nontoxic single thyroid nodule: Secondary | ICD-10-CM | POA: Insufficient documentation

## 2022-10-16 MED ORDER — SODIUM IODIDE I 131 CAPSULE
447.0000 | Freq: Once | INTRAVENOUS | Status: AC | PRN
  Administered 2022-10-16: 447 via ORAL

## 2022-10-17 ENCOUNTER — Ambulatory Visit: Payer: Medicare Other | Admitting: Internal Medicine

## 2022-10-17 ENCOUNTER — Encounter (HOSPITAL_COMMUNITY)
Admission: RE | Admit: 2022-10-17 | Discharge: 2022-10-17 | Disposition: A | Discharge status: Home / Self Care | Payer: Medicare Other | Source: Ambulatory Visit | Attending: Internal Medicine | Admitting: Internal Medicine

## 2022-10-17 DIAGNOSIS — E041 Nontoxic single thyroid nodule: Secondary | ICD-10-CM | POA: Diagnosis not present

## 2022-10-18 ENCOUNTER — Ambulatory Visit (INDEPENDENT_AMBULATORY_CARE_PROVIDER_SITE_OTHER): Payer: Medicare Other | Admitting: Internal Medicine

## 2022-10-18 ENCOUNTER — Encounter: Payer: Self-pay | Admitting: Internal Medicine

## 2022-10-18 VITALS — BP 126/66 | HR 58 | Temp 98.3°F | Resp 18 | Ht 65.0 in | Wt 248.2 lb

## 2022-10-18 DIAGNOSIS — Z7984 Long term (current) use of oral hypoglycemic drugs: Secondary | ICD-10-CM

## 2022-10-18 DIAGNOSIS — E11620 Type 2 diabetes mellitus with diabetic dermatitis: Secondary | ICD-10-CM

## 2022-10-18 DIAGNOSIS — R600 Localized edema: Secondary | ICD-10-CM

## 2022-10-18 NOTE — Patient Instructions (Addendum)
Your leg skin has the following condition (due to diabetes): "necrobiosis lipoidica diabeticorum" Avoid injuries Apply a moisturizing daily Also hydrocortisone 1% OTC once daily as needed    Vaccines I recommend: Flu shot this fall  Please bring [place] a copy of your Healthcare Power of Attorney for your chart    Check the  blood pressure regularly BP GOAL is between 110/65 and  135/85. If it is consistently higher or lower, let me know    GO TO THE FRONT DESK, PLEASE SCHEDULE YOUR APPOINTMENTS Come back for a check up in 2 months

## 2022-10-18 NOTE — Progress Notes (Unsigned)
Subjective:    Patient ID: Donald Jones, male    DOB: Aug 13, 1945, 77 y.o.   MRN: [id number]  DOS:  10/18/2022 Type of visit - description: Follow-up  Follow-up on diabetes, at LOV, pioglitazone was changed to Jardiance due to swelling.  Edema is better. Denies dysuria, gross hematuria, GU swelling or rash.  He is concerned about how his pretibial area looks.  Went to foot doctor and was told he had poor circulation.  He denies claudication.  Wt Readings from Last 3 Encounters:  10/18/22 248 lb 4 oz (112.6 kg)  09/22/22 253 lb (114.8 kg)  09/12/22 265 lb 8 oz (120.4 kg)    Review of Systems See above   Past Medical History:  Diagnosis Date   Anxiety    Arthritis    hands right   Diabetes mellitus without complication (HCC)    GERD (gastroesophageal reflux disease)    Hypertension    Kidney stones    several episodes.  Last one in 2011.     Obesity    100-lb weight loss since 2011.    Polycythemia    Sleep apnea    on Cpap    Past Surgical History:  Procedure Laterality Date   CHOLECYSTECTOMY     HAND SURGERY  2010   R hand , d/t a injury, 3 surgeries     Current Outpatient Medications  Medication Instructions   Accu-Chek FastClix Lancets MISC Use to check blood glucose once a day (Dx: type 2 DM - E 11.9)   albuterol (VENTOLIN HFA) 108 (90 Base) MCG/ACT inhaler 2 puffs, Inhalation, Every 6 hours PRN   AMBULATORY NON FORMULARY MEDICATION GI Cocktail - 90 mls of 2% Lidocaine<BR>                     90 mls Dicyclomine 10mg /56ml<BR>                   270 mls Maalox<BR>Take 5-10 ml every 4-6 hours as needed   amLODipine (NORVASC) 2.5 mg, Oral, Daily   aspirin EC 81 mg, Oral, Daily, Each morning. Swallow whole.    atorvastatin (LIPITOR) 40 mg, Oral, Daily at bedtime   BREO ELLIPTA 100-25 MCG/ACT AEPB 1 puff, Inhalation, Daily   Caraway Oil-Levomenthol (FDGARD) 25-20.75 MG CAPS 2 tablets, Oral, 2 times daily   diclofenac sodium (VOLTAREN) 4 g, Topical, 3 times daily  PRN   dicyclomine (BENTYL) 20 mg, Oral, 3 times daily before meals   empagliflozin (JARDIANCE) 10 mg, Oral, Daily before breakfast   escitalopram (LEXAPRO) 10 mg, Oral, Daily   famotidine (PEPCID) 20 mg, Oral, 2 times daily   Flaxseed Oil 2,000 mg, Oral, Daily   fluticasone (FLONASE) 50 MCG/ACT nasal spray 1 spray, Each Nare, Daily   furosemide (LASIX) 40 mg, Oral, Daily   glucose blood (ACCU-CHEK GUIDE) test strip Use to check blood glucose once a day (Dx: type 2 DM - E 11.9)   hydrocortisone 2.5 % cream Topical, 2 times daily   ketoconazole (NIZORAL) 2 % cream Topical, Daily   losartan (COZAAR) 100 mg, Oral, Daily   meclizine (ANTIVERT) 12.5 mg, Oral, 3 times daily PRN   Multiple Vitamin (MULTIVITAMIN) tablet 1 tablet, Oral, Daily   omeprazole (PRILOSEC) 20 MG capsule TAKE 1 CAPSULE (20 MG TOTAL) BY MOUTH DAILY.   pioglitazone (ACTOS) 30 mg, Oral, Daily   sildenafil (REVATIO) 20 MG tablet TAKE 3 TO 4 TABLETS BY MOUTH AT BEDTIME AS NEEDED.   sitaGLIPtin (  JANUVIA) 100 mg, Oral, Daily       Objective:   Physical Exam BP 126/66   Pulse (!) 58   Temp 98.3 F (36.8 C) (Oral)   Resp 18   Ht 5\' 5"  (1.651 m)   Wt 248 lb 4 oz (112.6 kg)   SpO2 95%   BMI 41.31 kg/m  General:   Well developed, NAD, BMI noted. HEENT:  Normocephalic . Face symmetric, atraumatic Lungs:  CTA B Normal respiratory effort, no intercostal retractions, no accessory muscle use. Heart: RRR,  no murmur.  Lower extremities: Normal femoral and pedal pulses DM foot exam: Normal pinprick examination and capillary refill Skin: Picture, pretibial skin Neurologic:  alert & oriented X3.  Speech normal, gait appropriate for age and unassisted Psych--  Cognition and judgment appear intact.  Cooperative with normal attention span and concentration.  Behavior appropriate. No anxious or depressed appearing.      Assessment     ASSESSMENT DM--Intolerant to Metformin, see office visit  09/09/2019 HTN Polycythemia- sees hematology, likely d/t OSA-diuretics, JAK2 (-) ~ 2014, last OV 10-2014, f/u prn OSA on CPAP Asthma  Anxiety ("lexapro prn") DJD- saw Dr Jerl Santos before  R Foot drop  GI: -GERD - IBS? GI sx on and off, previously dx w/ IBS -Cscopes: [id number], 09/18/2017 - EGD 04/2018, showed gastritis, no H. pylori, no malignancy Urolithiasis, several episodes CV: Sees cardiology, negative Myoview 2019, CT coronary angiography 08/2019: Minimal disease Chronic dermatitis: Lower extremities, pretibial   PLAN: Edema: see LOV, Improving. DM: pioglitazone discontinued due to edema and started on Jardiance, denies any complications, see ROS. Also on Januvia.  Will reassess DM at the next visit in 2 months Necrobiosis lipoidica diabeticorum: Pretibial skin changes consistent with necrobiosis lipoidica, recommend to avoid injuries, use moisturizers  and hydrocortisone prn. PVD?  Podiatrist told patient his circulation is poor, he has present pedal pulses and good femoral pulses.  No claudication.  Rec observation. Thyroid disease: Under the care of Endo. RTC 2 months

## 2022-10-19 NOTE — Assessment & Plan Note (Signed)
Edema: see LOV, Improving. DM: pioglitazone discontinued due to edema and started on Jardiance, denies any complications, see ROS. Also on Januvia.  Will reassess DM at the next visit in 2 months Necrobiosis lipoidica diabeticorum: Pretibial skin changes consistent with necrobiosis lipoidica, recommend to avoid injuries, use moisturizers  and hydrocortisone prn. PVD?  Podiatrist told patient his circulation is poor, he has present pedal pulses and good femoral pulses.  No claudication.  Rec observation. Thyroid disease: Under the care of Endo. RTC 2 months

## 2022-10-30 DIAGNOSIS — G4733 Obstructive sleep apnea (adult) (pediatric): Secondary | ICD-10-CM | POA: Diagnosis not present

## 2022-11-04 ENCOUNTER — Other Ambulatory Visit: Payer: Self-pay | Admitting: Internal Medicine

## 2022-11-04 ENCOUNTER — Ambulatory Visit
Admission: EM | Admit: 2022-11-04 | Discharge: 2022-11-04 | Disposition: A | Discharge status: Home / Self Care | Payer: Medicare Other | Attending: Internal Medicine | Admitting: Internal Medicine

## 2022-11-04 DIAGNOSIS — E11622 Type 2 diabetes mellitus with other skin ulcer: Secondary | ICD-10-CM

## 2022-11-04 MED ORDER — DOXYCYCLINE HYCLATE 100 MG PO CAPS: 100.0000 mg | ORAL_CAPSULE | Freq: Two times a day (BID) | ORAL | 0 refills | Status: DC

## 2022-11-04 NOTE — Discharge Instructions (Signed)
You were seen today with complaint of wounds to your bilateral lower extremities.  These are diabetic ulcers that appear to be infected.  I am putting you on antibiotics twice daily for the next 10 days.  Cleanse the area with soap and water but avoid scrubbing.  Pat dry.  Please follow-up with your PCP for further evaluation to see if needed referral to the wound clinic.

## 2022-11-04 NOTE — ED Provider Notes (Signed)
[facility]    CSN: [id number] Arrival date & time: 11/04/22  1438      History   Chief Complaint Chief Complaint  Patient presents with   Leg Swelling    HPI Donald Jones is a 77 y.o. male with a history of DM2 with chronic stasis dermatitis presents to urgent care today with complaint of wounds, redness and swelling to his bilateral lower extremities.  He reports he noticed this about 1 month ago.  He reports all of these wounds started out as blisters and then popped.  They have all scabbed over but he reports increase redness, warmth and discomfort to his bilateral lower extremities.  He denies fever, chills, body aches, nausea, vomiting or diarrhea.  He has been trying to keep the wounds clean by washing them with soap and water.  He has not been putting any medication on them.  His last A1c was 7.1%, 08/2022.  HPI  Past Medical History:  Diagnosis Date   Anxiety    Arthritis    hands right   Diabetes mellitus without complication (HCC)    GERD (gastroesophageal reflux disease)    Hypertension    Kidney stones    several episodes.  Last one in 2011.     Obesity    100-lb weight loss since 2011.    Polycythemia    Sleep apnea    on Cpap    Patient Active Problem List   Diagnosis Date Noted   Pain due to onychomycosis of toenails of both feet 10/06/2022   Chronic stasis dermatitis 11/06/2021   Chronic dermatitis- legs 09/01/2021   Asthma 04/06/2021   Anxiety 07/07/2019   High cholesterol 01/20/2018   DJD (degenerative joint disease) 09/06/2017   Erectile dysfunction 09/06/2017   PCP NOTES >>> 01/23/2015   Subclinical hyperthyroidism 10/05/2014   Diabetes (HCC) 10/05/2014   Morbid obesity (HCC) 12/03/2012   GERD (gastroesophageal reflux disease) 09/24/2012   IBS ? 04/22/2012   Polycythemia    Annual physical exam 06/05/2011   Foot drop, right 06/05/2011   HYPERTENSION, BENIGN SYSTEMIC 06/14/2006   Allergic rhinitis 06/14/2006    NEPHROLITHIASIS 06/14/2006   OSA (obstructive sleep apnea) 06/14/2006    Past Surgical History:  Procedure Laterality Date   CHOLECYSTECTOMY     HAND SURGERY  2010   R hand , d/t a injury, 3 surgeries        Home Medications    Prior to Admission medications   Medication Sig Start Date End Date Taking? Authorizing Provider  Accu-Chek FastClix Lancets MISC Use to check blood glucose once a day (Dx: type 2 DM - E 11.9) 07/18/22  Yes Paz, Elita Quick E, MD  albuterol (VENTOLIN HFA) 108 (90 Base) MCG/ACT inhaler INHALE 2 PUFFS INTO THE LUNGS EVERY 6 HOURS AS NEEDED FOR WHEEZING OR SHORTNESS OF BREATH. 06/05/22  Yes Paz, Jose E, MD  amLODipine (NORVASC) 2.5 MG tablet TAKE 1 TABLET (2.5 MG TOTAL) BY MOUTH DAILY. 09/04/22  Yes Saguier, Kateri Mc  aspirin EC 81 MG tablet Take 81 mg by mouth daily. Each morning. Swallow whole.   Yes [provider]  atorvastatin (LIPITOR) 40 MG tablet Take 1 tablet (40 mg total) by mouth at bedtime. 09/29/22  Yes Paz, Jose E, MD  BREO ELLIPTA 100-25 MCG/ACT AEPB Inhale 1 puff into the lungs daily. 07/18/22  Yes Paz, Nolon Rod, MD  dicyclomine (BENTYL) 20 MG tablet Take 1 tablet (20 mg total) by mouth 3 (three) times daily before meals.  04/06/22  Yes Paz, Nolon Rod, MD  doxycycline (VIBRAMYCIN) 100 MG capsule Take 1 capsule (100 mg total) by mouth 2 (two) times daily. 11/04/22  Yes Lorre Munroe, NP  empagliflozin (JARDIANCE) 10 MG TABS tablet Take 1 tablet (10 mg total) by mouth daily before breakfast. 09/12/22  Yes Paz, Nolon Rod, MD  escitalopram (LEXAPRO) 10 MG tablet Take 1 tablet (10 mg total) by mouth daily. 08/28/22  Yes Paz, Nolon Rod, MD  famotidine (PEPCID) 20 MG tablet Take 1 tablet (20 mg total) by mouth 2 (two) times daily. 04/06/22  Yes Paz, Jose E, MD  Flaxseed, Linseed, (FLAXSEED OIL) 1000 MG CAPS Take 2,000 mg by mouth daily.    Yes [provider]  fluticasone (FLONASE) 50 MCG/ACT nasal spray Place 1 spray into both nostrils daily. 10/02/22  Yes Paz,  Elita Quick E, MD  glucose blood (ACCU-CHEK GUIDE) test strip Use to check blood glucose once a day (Dx: type 2 DM - E 11.9) 07/18/22  Yes Paz, Nolon Rod, MD  hydrocortisone 2.5 % cream Apply topically 2 (two) times daily. 08/21/16  Yes Paz, Nolon Rod, MD  ketoconazole (NIZORAL) 2 % cream Apply topically daily. 08/10/22  Yes Paz, Nolon Rod, MD  losartan (COZAAR) 100 MG tablet Take 1 tablet (100 mg total) by mouth daily. 09/04/22  Yes Paz, Nolon Rod, MD  meclizine (ANTIVERT) 12.5 MG tablet Take 1 tablet (12.5 mg total) by mouth 3 (three) times daily as needed for dizziness. 05/15/22  Yes Saguier, Ramon Dredge, PA-C  Multiple Vitamin (MULTIVITAMIN) tablet Take 1 tablet by mouth daily.   Yes [provider]  sildenafil (REVATIO) 20 MG tablet TAKE 3 TO 4 TABLETS BY MOUTH AT BEDTIME AS NEEDED. 12/30/18  Yes Paz, Nolon Rod, MD  sitaGLIPtin (JANUVIA) 100 MG tablet Take 1 tablet (100 mg total) by mouth daily. 05/09/22  Yes Paz, Nolon Rod, MD  AMBULATORY NON FORMULARY MEDICATION GI Cocktail - 90 mls of 2% Lidocaine                      90 mls Dicyclomine 10mg /30ml                    270 mls Maalox Take 5-10 ml every 4-6 hours as needed Patient not taking: Reported on 10/18/2022 07/25/19   Barrett, Joline Salt, PA-C  Caraway Oil-Levomenthol (FDGARD) 25-20.75 MG CAPS Take 2 tablets by mouth in the morning and at bedtime.     [provider]  diclofenac sodium (VOLTAREN) 1 % GEL Apply 4 g topically 3 (three) times daily as needed. 05/01/18   Wanda Plump, MD  furosemide (LASIX) 40 MG tablet TAKE 1 TABLET (40 MG TOTAL) BY MOUTH DAILY. Patient taking differently: Take 30 mg by mouth daily. 06/26/22   Wanda Plump, MD  omeprazole (PRILOSEC) 20 MG capsule TAKE 1 CAPSULE (20 MG TOTAL) BY MOUTH DAILY. 06/26/22   Wanda Plump, MD    Family History Family History  Problem Relation Age of Onset   Diabetes Mother    Coronary artery disease Mother 92   Stroke Father        M and F   Hypertension Father    Heart disease Sister        ?    Alcohol abuse Sister    Colon cancer Sister 17       age ~ 33   Prostate cancer Neg Hx    Stomach cancer Neg Hx  Rectal cancer Neg Hx    Esophageal cancer Neg Hx     Social History Social History   Tobacco Use   Smoking status: Never   Smokeless tobacco: Never  Vaping Use   Vaping status: Never Used  Substance Use Topics   Alcohol use: No    Comment: none for 15 years   Drug use: No     Allergies   Metformin and related and Sulfa antibiotics   Review of Systems Review of Systems  Constitutional:  Negative for chills and fever.  Respiratory:  Negative for shortness of breath.   Cardiovascular:  Positive for leg swelling. Negative for chest pain.  Gastrointestinal:  Negative for diarrhea, nausea and vomiting.  Musculoskeletal:  Negative for arthralgias and myalgias.  Skin:  Positive for color change and wound.  Neurological:  Negative for dizziness, weakness and headaches.     Physical Exam Triage Vital Signs ED Triage Vitals  Encounter Vitals Group     BP 11/04/22 1500 122/80     Systolic BP Percentile --      Diastolic BP Percentile --      Pulse Rate 11/04/22 1500 71     Resp 11/04/22 1500 18     Temp 11/04/22 1500 98.4 F (36.9 C)     Temp Source 11/04/22 1500 Oral     SpO2 11/04/22 1500 95 %     Weight 11/04/22 1457 243 lb (110.2 kg)     Height 11/04/22 1457 5\' 5"  (1.651 m)     Head Circumference --      Peak Flow --      Pain Score 11/04/22 1457 6     Pain Loc --      Pain Education --      Exclude from Growth Chart --    No data found.  Updated Vital Signs BP 122/80 (BP Location: Left Arm)   Pulse 71   Temp 98.4 F (36.9 C) (Oral)   Resp 18   Ht 5\' 5"  (1.651 m)   Wt 243 lb (110.2 kg)   SpO2 95%   BMI 40.44 kg/m      Physical Exam Constitutional:      General: He is not in acute distress.    Appearance: He is obese.  Cardiovascular:     Rate and Rhythm: Normal rate and regular rhythm.     Pulses: Normal pulses.     Heart  sounds: Normal heart sounds.     Comments: 1+ pitting BLE edema noted Pulmonary:     Effort: Pulmonary effort is normal.     Breath sounds: Normal breath sounds.  Skin:    Capillary Refill: Capillary refill takes 2 to 3 seconds.     Findings: Lesion present.     Comments: Multiple scabbed open ulcers noted of BLE with surrounding redness, warmth and tenderness with palpation.  PVD changes noted to BLE.  Neurological:     General: No focal deficit present.     Mental Status: He is alert and oriented to person, place, and time.      UC Treatments / Results  Labs   EKG   Radiology No results found.  Procedures Procedures (including critical care time)  Medications Ordered in UC Medications - No data to display  Initial Impression / Assessment and Plan / UC Course  I have reviewed the triage vital signs and the nursing notes.  Pertinent labs & imaging results that were available during my care of the patient were  reviewed by me and considered in my medical decision making (see chart for details).     77 year old male with DM2, stasis dermatitis with multiple ulcerations to BLE.  I am concerned about diabetic ulcers with surrounding cellulitis.  He is allergic to sulfa.  Will treat with doxycycline 100 mg twice daily x 10 days.  Advised him to clean the area with soap and water, pat dry.  Advised him to follow-up with his PCP on Monday for referral to the wound clinic for further evaluation and treatment.  Final Clinical Impressions(s) / UC Diagnoses   Final diagnoses:  Type 2 diabetes mellitus with other skin ulcer, without long-term current use of insulin (HCC)     Discharge Instructions      You were seen today with complaint of wounds to your bilateral lower extremities.  These are diabetic ulcers that appear to be infected.  I am putting you on antibiotics twice daily for the next 10 days.  Cleanse the area with soap and water but avoid scrubbing.  Pat dry.  Please  follow-up with your PCP for further evaluation to see if needed referral to the wound clinic.     ED Prescriptions     Medication Sig Dispense Auth. Provider   doxycycline (VIBRAMYCIN) 100 MG capsule Take 1 capsule (100 mg total) by mouth 2 (two) times daily. 20 capsule Lorre Munroe, NP      PDMP not reviewed this encounter.   Lorre Munroe, NP 11/04/22 1512

## 2022-11-04 NOTE — ED Triage Notes (Signed)
"  My legs are swelling with lots of sores and drainage". "I recently scraped them on different things at home but the infection and redness isn't improving". No fever.

## 2022-11-06 ENCOUNTER — Ambulatory Visit (INDEPENDENT_AMBULATORY_CARE_PROVIDER_SITE_OTHER): Payer: Medicare Other | Admitting: Family Medicine

## 2022-11-06 ENCOUNTER — Encounter: Payer: Self-pay | Admitting: Family Medicine

## 2022-11-06 VITALS — BP 143/65 | HR 60 | Temp 97.8°F | Ht 65.0 in | Wt 246.0 lb

## 2022-11-06 DIAGNOSIS — S81802A Unspecified open wound, left lower leg, initial encounter: Secondary | ICD-10-CM

## 2022-11-06 DIAGNOSIS — M792 Neuralgia and neuritis, unspecified: Secondary | ICD-10-CM

## 2022-11-06 DIAGNOSIS — S81801A Unspecified open wound, right lower leg, initial encounter: Secondary | ICD-10-CM

## 2022-11-06 MED ORDER — GABAPENTIN 300 MG PO CAPS: 300.0000 mg | ORAL_CAPSULE | Freq: Every day | ORAL | 3 refills | Status: DC

## 2022-11-06 NOTE — Patient Instructions (Signed)
We can try a small amount of gabapentin at night for your nerve pain. Follow-up with PCP if not helping.   Continue antibiotic and wound care. Given the numerous lesions, will refer to wound clinic to ensure adequate management. Watch for any new or worsening symptoms and let us know.

## 2022-11-06 NOTE — Progress Notes (Signed)
   Acute Office Visit  Subjective:     Patient ID: Donald Jones, male    DOB: [birth year], 77 y.o.   MRN: [id number]  Chief Complaint  Patient presents with   Hospitalization Follow-up    HPI Patient is in today for urgent care follow-up.   [facility] 11/04/22 with CC of lower extremity wounds, redness, and swelling for about 1 month. He has a history of DM2 and chronic stasis dermatitis. See picture in their note of wounds. He was started on doxycycline for possible cellulitis and told to follow-up with PCP for referral to wound clinic.      ROS All review of systems negative except what is listed in the HPI      Objective:    BP (!) 143/65   Pulse 60   Temp 97.8 F (36.6 C) (Oral)   Ht 5\' 5"  (1.651 m)   Wt 246 lb (111.6 kg)   SpO2 99%   BMI 40.94 kg/m    Physical Exam Vitals reviewed.  Constitutional:      Appearance: Normal appearance. He is obese.  Musculoskeletal:        General: Normal range of motion.     Right lower leg: Edema present.     Left lower leg: Edema present.  Skin:    General: Skin is warm and dry.     Findings: Erythema and lesion present.     Comments: See pictures below  Neurological:     Mental Status: He is alert and oriented to person, place, and time.  Psychiatric:        Mood and Affect: Mood normal.        Behavior: Behavior normal.        Thought Content: Thought content normal.        Judgment: Judgment normal.            No results found for any visits on 11/06/22.      Assessment & Plan:   Problem List Items Addressed This Visit   None Visit Diagnoses     Open wound of both lower extremities, initial encounter    -  Primary Continue antibiotic and wound care. Given the numerous lesions, will refer to wound clinic to ensure adequate management. Watch for any new or worsening symptoms and let us know.     Relevant Orders   Ambulatory referral to Wound Clinic   Nerve pain      We can try a small amount of gabapentin at night for your nerve pain. Follow-up with PCP if not helping.    Relevant Medications   gabapentin (NEURONTIN) 300 MG capsule       Meds ordered this encounter  Medications   gabapentin (NEURONTIN) 300 MG capsule    Sig: Take 1 capsule (300 mg total) by mouth at bedtime.    Dispense:  90 capsule    Refill:  3    Order Specific Question:   Supervising Provider    Answer:   Danise Edge A [[id number]]    Return in about 2 weeks (around 11/20/2022) for BP check with nurse.  Clayborne Dana, NP

## 2022-11-14 ENCOUNTER — Encounter (HOSPITAL_BASED_OUTPATIENT_CLINIC_OR_DEPARTMENT_OTHER): Payer: Medicare Other | Attending: Internal Medicine | Admitting: Internal Medicine

## 2022-11-14 DIAGNOSIS — G4733 Obstructive sleep apnea (adult) (pediatric): Secondary | ICD-10-CM | POA: Diagnosis not present

## 2022-11-14 DIAGNOSIS — M21371 Foot drop, right foot: Secondary | ICD-10-CM | POA: Insufficient documentation

## 2022-11-14 DIAGNOSIS — L97821 Non-pressure chronic ulcer of other part of left lower leg limited to breakdown of skin: Secondary | ICD-10-CM | POA: Diagnosis not present

## 2022-11-14 DIAGNOSIS — E7800 Pure hypercholesterolemia, unspecified: Secondary | ICD-10-CM | POA: Insufficient documentation

## 2022-11-14 DIAGNOSIS — M1990 Unspecified osteoarthritis, unspecified site: Secondary | ICD-10-CM | POA: Diagnosis not present

## 2022-11-14 DIAGNOSIS — L97811 Non-pressure chronic ulcer of other part of right lower leg limited to breakdown of skin: Secondary | ICD-10-CM | POA: Diagnosis not present

## 2022-11-14 DIAGNOSIS — I10 Essential (primary) hypertension: Secondary | ICD-10-CM | POA: Insufficient documentation

## 2022-11-14 DIAGNOSIS — I87313 Chronic venous hypertension (idiopathic) with ulcer of bilateral lower extremity: Secondary | ICD-10-CM | POA: Diagnosis not present

## 2022-11-14 DIAGNOSIS — E11622 Type 2 diabetes mellitus with other skin ulcer: Secondary | ICD-10-CM | POA: Insufficient documentation

## 2022-11-14 DIAGNOSIS — I872 Venous insufficiency (chronic) (peripheral): Secondary | ICD-10-CM | POA: Insufficient documentation

## 2022-11-15 NOTE — Progress Notes (Signed)
Donald Jones, Donald Jones ([id number]) [id number] Page 1 of 4 Visit Report for 11/14/2022 Abuse Risk Screen Details Patient Name: Date of Service: Donald Jones, [name] 11/14/2022 8:00 A M Medical Record Number: [id number] Patient Account Number: [id number] Date of Birth/Sex: Treating RN: 1945-10-27 (77 y.o. [name] Primary Care Sayla Golonka: [name] Other Clinician: Referring Jamerius Boeckman: Treating Hart Haas/Extender: Ivan Croft in Treatment: 0 Abuse Risk Screen Items Answer ABUSE RISK SCREEN: Has anyone close to you tried to hurt or harm you recentlyo No Do you feel uncomfortable with anyone in your familyo No Has anyone forced you do things that you didnt want to doo No Electronic Signature(s) Signed: 11/14/2022 5:06:56 PM By: [name] RN, BSN Entered By: [name] on 11/14/2022 08:23:53 -------------------------------------------------------------------------------- Activities of Daily Living Details Patient Name: Date of Service: Donald Jones, [name] 11/14/2022 8:00 A M Medical Record Number: [id number] Patient Account Number: [id number] Date of Birth/Sex: Treating RN: [birth year] (77 y.o. [name] Primary Care Renae Mottley: [name] Other Clinician: Referring Mikaylah Libbey: Treating Morton Simson/Extender: Ivan Croft in Treatment: 0 Activities of Daily Living Items Answer Activities of Daily Living (Please select one for each item) Drive Automobile Completely Able T Medications ake Completely Able Use T elephone Completely Able Care for Appearance Completely Able Use T oilet Completely Able Bath / Shower Completely Able Dress Self Completely Able Feed Self Completely Able Walk Completely Able Get In / Out Bed Completely Able Housework Completely Able Prepare Meals Completely Able Handle Money Completely Able Shop for Self Completely Able Electronic Signature(s) Signed: 11/14/2022  5:06:56 PM By: [name] RN, BSN Entered By: [name] on 11/14/2022 08:24:22 Donald Jones, Donald Jones ([id number]) [id number] Page 2 of 4 -------------------------------------------------------------------------------- Education Screening Details Patient Name: Date of Service: Donald RA [place], [name] 11/14/2022 8:00 A M Medical Record Number: [id number] Patient Account Number: [id number] Date of Birth/Sex: Treating RN: 01-26-1946 (77 y.o. [name] Primary Care Keyanah Kozicki: [name] Other Clinician: Referring Dahlia Nifong: Treating Elexa Kivi/Extender: Ivan Croft in Treatment: 0 Primary Learner Assessed: Patient Learning Preferences/Education Level/Primary Language Learning Preference: Explanation, Demonstration, Printed Material Preferred Language: English Cognitive Barrier Language Barrier: No Translator Needed: No Memory Deficit: No Emotional Barrier: No Cultural/Religious Beliefs Affecting Medical Care: No Physical Barrier Impaired Vision: No Impaired Hearing: No Decreased Hand dexterity: No Knowledge/Comprehension Knowledge Level: High Comprehension Level: High Ability to understand written instructions: High Ability to understand verbal instructions: High Motivation Anxiety Level: Calm Cooperation: Cooperative Education Importance: Acknowledges Need Interest in Health Problems: Asks Questions Perception: Coherent Willingness to Engage in Self-Management High Activities: Readiness to Engage in Self-Management High Activities: Electronic Signature(s) Signed: 11/14/2022 5:06:56 PM By: [name] RN, BSN Entered By: [name] on 11/14/2022 08:25:05 -------------------------------------------------------------------------------- Fall Risk Assessment Details Patient Name: Date of Service: Donald Jones, [name] 11/14/2022 8:00 A M Medical Record Number: [id number] Patient Account Number: [id number] Date of  Birth/Sex: Treating RN: 1945-07-31 (77 y.o. [name] Primary Care Cerria Randhawa: [name] Other Clinician: Referring Anndee Connett: Treating Lerone Onder/Extender: Ivan Croft in Treatment: 0 Fall Risk Assessment Items Have you had 2 or more falls in the last 12 monthso 0 No Have you had any fall that resulted in injury in the last 12 monthso 0 No Donald Jones, Donald Jones ([id number]) [phone] [id number] Page 3 of 4 FALLS RISK SCREEN History of falling - immediate or within 3 months 0 No Secondary diagnosis (Do you have 2 or more  medical diagnoseso) 0 No Ambulatory aid None/bed rest/wheelchair/nurse 0 Yes Crutches/cane/walker 0 No Furniture 0 No Intravenous therapy Access/Saline/Heparin Lock 0 No Gait/Transferring Normal/ bed rest/ wheelchair 0 Yes Weak (short steps with or without shuffle, stooped but able to lift head while walking, may seek 0 No support from furniture) Impaired (short steps with shuffle, may have difficulty arising from chair, head down, impaired 0 No balance) Mental Status Oriented to own ability 0 Yes Electronic Signature(s) Signed: 11/14/2022 5:06:56 PM By: [name] RN, BSN Entered By: [name] on 11/14/2022 08:25:53 -------------------------------------------------------------------------------- Foot Assessment Details Patient Name: Date of Service: Donald Jones, [name] 11/14/2022 8:00 A M Medical Record Number: [id number] Patient Account Number: [id number] Date of Birth/Sex: Treating RN: 1946/03/02 (77 y.o. [name] Primary Care Levonia Wolfley: [name] Other Clinician: Referring Miu Chiong: Treating Zavier Canela/Extender: Ivan Croft in Treatment: 0 Foot Assessment Items Site Locations + = Sensation present, - = Sensation absent, C = Callus, U = Ulcer R = Redness, W = Warmth, M = Maceration, PU = Pre-ulcerative lesion F = Fissure, S = Swelling, D = Dryness Assessment Right:  Left: Other Deformity: No No Prior Foot Ulcer: No No Prior Amputation: No No Charcot Joint: No No Ambulatory Status: Ambulatory Without Help GaitRAESEAN, Donald Jones ([id number]) [phone].pdf Page 4 of 4 Notes drop foot RT Electronic Signature(s) Signed: 11/14/2022 5:06:56 PM By: [name] RN, BSN Entered By: [name] on 11/14/2022 08:30:42 -------------------------------------------------------------------------------- Nutrition Risk Screening Details Patient Name: Date of Service: Donald Jones, [name] 11/14/2022 8:00 A M Medical Record Number: [id number] Patient Account Number: [id number] Date of Birth/Sex: Treating RN: Sep 18, 1945 (77 y.o. [name] Primary Care Nylah Butkus: [name] Other Clinician: Referring Shannie Kontos: Treating Octavia Mottola/Extender: Ivan Croft in Treatment: 0 Height (in): Weight (lbs): Body Mass Index (BMI): Nutrition Risk Screening Items Score Screening NUTRITION RISK SCREEN: I have an illness or condition that made me change the kind and/or amount of food I eat 0 No I eat fewer than two meals per day 0 No I eat few fruits and vegetables, or milk products 0 No I have three or more drinks of beer, liquor or wine almost every day 0 No I have tooth or mouth problems that make it hard for me to eat 0 No I don't always have enough money to buy the food I need 0 No I eat alone most of the time 0 No I take three or more different prescribed or over-the-counter drugs a day 1 Yes Without wanting to, I have lost or gained 10 pounds in the last six months 0 No I am not always physically able to shop, cook and/or feed myself 0 No Nutrition Protocols Good Risk Protocol Moderate Risk Protocol High Risk Proctocol Risk Level: Good Risk Score: 1 Electronic Signature(s) Signed: 11/14/2022 5:06:56 PM By: [name] RN, BSN Entered By: [name] on 11/14/2022 08:26:39

## 2022-11-15 NOTE — Progress Notes (Signed)
JAYLYN, RODRIQUES ([id number]) [id number] Page 1 of 14 Visit Report for 11/14/2022 Allergy List Details Patient Name: Date of Service: [name], [name] 11/14/2022 8:00 A M Medical Record Number: [id number] Patient Account Number: [id number] Date of Birth/Sex: Treating RN: 27-Dec-1945 (77 y.o. [name] Primary Care Lecretia Buczek: [name] Other Clinician: Referring Ryler Laskowski: Treating Ferry Matthis/Extender: Ivin Booty Weeks in Treatment: 0 Allergies Active Allergies metformin Reaction: GI symptoms Severity: Mild Sulfa (Sulfonamide Antibiotics) Allergy Notes Electronic Signature(s) Signed: 11/14/2022 5:06:56 PM By: [name] RN, BSN Entered By: [name] on 11/13/2022 17:08:16 -------------------------------------------------------------------------------- Arrival Information Details Patient Name: Date of Service: [name], [name] 11/14/2022 8:00 A M Medical Record Number: [id number] Patient Account Number: [id number] Date of Birth/Sex: Treating RN: 28-Dec-1945 (77 y.o. [name] Primary Care Sharna Gabrys: [name] Other Clinician: Referring Ashani Pumphrey: Treating Bhumi Godbey/Extender: Ivan Croft in Treatment: 0 Visit Information Patient Arrived: Ambulatory Arrival Time: 08:10 Accompanied By: self Transfer Assistance: None Patient Identification Verified: Yes Secondary Verification Process Completed: Yes Patient Has Alerts: Yes Patient Alerts: Patient on Blood Thinner Electronic Signature(s) Signed: 11/14/2022 5:06:56 PM By: [name] RN, BSN Entered By: [name] on 11/14/2022 08:11:02 VICK, BARRIGA ([id number]) [id number] Page 2 of 14 -------------------------------------------------------------------------------- Clinic Level of Care Assessment Details Patient Name: Date of Service: [name], [place] 11/14/2022 8:00 A M Medical Record Number:  [id number] Patient Account Number: [id number] Date of Birth/Sex: Treating RN: [birth year] (77 y.o. [name] Primary Care Daijon Wenke: [name] Other Clinician: Referring Johnatha Zeidman: Treating Heather Streeper/Extender: Ivan Croft in Treatment: 0 Clinic Level of Care Assessment Items TOOL 2 Quantity Score X- 1 0 Use when only an EandM is performed on the INITIAL visit ASSESSMENTS - Nursing Assessment / Reassessment X- 1 20 General Physical Exam (combine w/ comprehensive assessment (listed just below) when performed on new pt. evals) X- 1 25 Comprehensive Assessment (HX, ROS, Risk Assessments, Wounds Hx, etc.) ASSESSMENTS - Wound and Skin A ssessment / Reassessment []  - 0 Simple Wound Assessment / Reassessment - one wound X- 4 5 Complex Wound Assessment / Reassessment - multiple wounds []  - 0 Dermatologic / Skin Assessment (not related to wound area) ASSESSMENTS - Ostomy and/or Continence Assessment and Care []  - 0 Incontinence Assessment and Management []  - 0 Ostomy Care Assessment and Management (repouching, etc.) PROCESS - Coordination of Care X - Simple Patient / Family Education for ongoing care 1 15 []  - 0 Complex (extensive) Patient / Family Education for ongoing care X- 1 10 Staff obtains Chiropractor, Records, T Results / Process Orders est []  - 0 Staff telephones HHA, Nursing Homes / Clarify orders / etc []  - 0 Routine Transfer to another Facility (non-emergent condition) []  - 0 Routine Hospital Admission (non-emergent condition) X- 1 15 New Admissions / Manufacturing engineer / Ordering NPWT Apligraf, etc. , []  - 0 Emergency Hospital Admission (emergent condition) []  - 0 Simple Discharge Coordination []  - 0 Complex (extensive) Discharge Coordination PROCESS - Special Needs []  - 0 Pediatric / Minor Patient Management []  - 0 Isolation Patient Management []  - 0 Hearing / Language / Visual special needs []  - 0 Assessment of Community  assistance (transportation, D/C planning, etc.) []  - 0 Additional assistance / Altered mentation []  - 0 Support Surface(s) Assessment (bed, cushion, seat, etc.) INTERVENTIONS - Wound Cleansing / Measurement X- 1 5 Wound Imaging (photographs - any number of wounds) []  - 0 Wound Tracing (instead of photographs) []  -  0 Simple Wound Measurement - one wound X- 4 5 Complex Wound Measurement - multiple wounds []  - 0 Simple Wound Cleansing - one wound X- 4 5 Complex Wound Cleansing - multiple wounds INTERVENTIONS - Wound Dressings []  - 0 Small Wound Dressing one or multiple wounds DNAIEL, DEHRING ([id number]) [id number] Page 3 of 14 X- 2 15 Medium Wound Dressing one or multiple wounds []  - 0 Large Wound Dressing one or multiple wounds []  - 0 Application of Medications - injection INTERVENTIONS - Miscellaneous []  - 0 External ear exam []  - 0 Specimen Collection (cultures, biopsies, blood, body fluids, etc.) []  - 0 Specimen(s) / Culture(s) sent or taken to Lab for analysis []  - 0 Patient Transfer (multiple staff / Nurse, adult / Similar devices) []  - 0 Simple Staple / Suture removal (25 or less) []  - 0 Complex Staple / Suture removal (26 or more) []  - 0 Hypo / Hyperglycemic Management (close monitor of Blood Glucose) X- 1 15 Ankle / Brachial Index (ABI) - do not check if billed separately Has the patient been seen at the hospital within the last three years: Yes Total Score: 195 Level Of Care: New/Established - Level 5 Electronic Signature(s) Signed: 11/14/2022 5:06:56 PM By: [name] RN, BSN Entered By: [name] on 11/14/2022 12:05:55 -------------------------------------------------------------------------------- Encounter Discharge Information Details Patient Name: Date of Service: [name], [name] 11/14/2022 8:00 A M Medical Record Number: [id number] Patient Account Number: [id number] Date of Birth/Sex: Treating RN: 27-Dec-1945  (77 y.o. [name] Primary Care Mckenzie Toruno: [name] Other Clinician: Referring Velena Keegan: Treating Jenson Beedle/Extender: Ivan Croft in Treatment: 0 Encounter Discharge Information Items Discharge Condition: Stable Ambulatory Status: Ambulatory Discharge Destination: Home Transportation: Private Auto Accompanied By: self Schedule Follow-up Appointment: Yes Clinical Summary of Care: Patient Declined Electronic Signature(s) Signed: 11/14/2022 5:06:56 PM By: [name] RN, BSN Entered By: [name] on 11/14/2022 12:06:45 -------------------------------------------------------------------------------- Lower Extremity Assessment Details Patient Name: Date of Service: [name], [name] 11/14/2022 8:00 A M Medical Record Number: [id number] Patient Account Number: [id number] Date of Birth/Sex: Treating RN: Sep 25, 1945 (76 y.o. [name] Primary Care Layce Sprung: [name] Other Clinician: Referring Rafik Koppel: Treating Kamisha Ell/Extender: Ivan Croft in Treatment: 0 JYAIR, HAMMERSLEY ([id number]) [id number] Page 4 of 14 Edema Assessment Assessed: [Left: Yes] [Right: Yes] Edema: [Left: Yes] [Right: Yes] Calf Left: Right: Point of Measurement: 32 cm From Medial Instep 36.4 cm 35.5 cm Ankle Left: Right: Point of Measurement: 14 cm From Medial Instep 22 cm 20.5 cm Knee To Floor Left: Right: From Medial Instep 44 cm Vascular Assessment Pulses: Dorsalis Pedis Palpable: [Left:Yes] [Right:Yes] Extremity colors, hair growth, and conditions: Extremity Color: [Left:Red] [Right:Red] Hair Growth on Extremity: [Left:No] [Right:No] Temperature of Extremity: [Left:Warm] [Right:Warm] Capillary Refill: [Left:< 3 seconds] [Right:< 3 seconds] Dependent Rubor: [Left:No] [Right:No] Blanched when Elevated: [Left:No] [Right:No] Lipodermatosclerosis: [Left:No] [Right:No] Blood Pressure: Brachial: [Left:156]  [Right:156] Ankle: [Left:Dorsalis Pedis: 150 0.96] [Right:Dorsalis Pedis: 160 1.03] Toe Nail Assessment Left: Right: Thick: Yes Yes Discolored: Yes Yes Deformed: Yes Yes Improper Length and Hygiene: No No Electronic Signature(s) Signed: 11/14/2022 5:06:56 PM By: [name] RN, BSN Entered By: [name] on 11/14/2022 08:37:48 -------------------------------------------------------------------------------- Multi Wound Chart Details Patient Name: Date of Service: [name], [name] 11/14/2022 8:00 A M Medical Record Number: [id number] Patient Account Number: [id number] Date of Birth/Sex: Treating RN: 15-Nov-1945 (77 y.o. M) Primary Care Macai Sisneros: [name] Other Clinician: Referring Emmary Culbreath: Treating Catalea Labrecque/Extender: Joette Catching,  Lynford Citizen in Treatment: 0 Vital Signs Height(in): Capillary Blood Glucose(mg/dl): 161 Weight(lbs): Pulse(bpm): 53 Body Mass Index(BMI): Blood Pressure(mmHg): 156/81 Temperature(F): 98.0 Respiratory Rate(breaths/min): [address], Teruo H ([id number]) [1:Photos:] [phone].pdf Page 5 of 14] Right, Posterior Lower Leg Right, Anterior Lower Leg Left, Medial Lower Leg Wound Location: Gradually Appeared Gradually Appeared Gradually Appeared Wounding Event: Venous Leg Ulcer Venous Leg Ulcer Venous Leg Ulcer Primary Etiology: N/A Diabetic Wound/Ulcer of the Lower Diabetic Wound/Ulcer of the Lower Secondary Etiology: Extremity Extremity Chronic sinus problems/congestion, Chronic sinus problems/congestion, Chronic sinus problems/congestion, Comorbid History: Asthma, Sleep Apnea, Hypertension, Asthma, Sleep Apnea, Hypertension, Asthma, Sleep Apnea, Hypertension, Type II Diabetes, Osteoarthritis, Type II Diabetes, Osteoarthritis, Type II Diabetes, Osteoarthritis, Confinement Anxiety Confinement Anxiety Confinement Anxiety 11/06/2022 11/06/2022 11/06/2022 Date Acquired: 0 0 0 Weeks of Treatment: Open Open  Open Wound Status: No No No Wound Recurrence: 0.5x0.5x0.1 1.3x0.3x0.1 2x1.5x0.1 Measurements L x W x D (cm) 0.196 0.306 2.356 A (cm) : rea 0.02 0.031 0.236 Volume (cm) : Full Thickness Without Exposed Full Thickness Without Exposed Full Thickness Without Exposed Classification: Support Structures Support Structures Support Structures Medium Medium Medium Exudate A mount: Serosanguineous Serosanguineous Serosanguineous Exudate Type: red, brown red, brown red, brown Exudate Color: None Present (0%) None Present (0%) None Present (0%) Granulation Amount: Large (67-100%) Large (67-100%) Large (67-100%) Necrotic Amount: Eschar, Adherent Slough Adherent [employer], Adherent Slough Necrotic Tissue: Fat Layer (Subcutaneous Tissue): Yes Fat Layer (Subcutaneous Tissue): Yes Fat Layer (Subcutaneous Tissue): Yes Exposed Structures: Fascia: No Fascia: No Fascia: No Tendon: No Tendon: No Tendon: No Muscle: No Muscle: No Muscle: No Joint: No Joint: No Joint: No Bone: No Bone: No Bone: No None None None Epithelialization: Hemosiderin Staining: Yes Hemosiderin Staining: Yes Periwound Skin Color: Wound Number: 4 N/A N/A Photos: N/A N/A Left, Anterior Lower Leg N/A N/A Wound Location: Gradually Appeared N/A N/A Wounding Event: Venous Leg Ulcer N/A N/A Primary Etiology: Diabetic Wound/Ulcer of the Lower N/A N/A Secondary Etiology: Extremity Chronic sinus problems/congestion, N/A N/A Comorbid History: Asthma, Sleep Apnea, Hypertension, Type II Diabetes, Osteoarthritis, Confinement Anxiety 11/06/2022 N/A N/A Date Acquired: 0 N/A N/A Weeks of Treatment: Open N/A N/A Wound Status: No N/A N/A Wound Recurrence: 0.5x0.3x0.1 N/A N/A Measurements L x W x D (cm) 0.118 N/A N/A A (cm) : rea 0.012 N/A N/A Volume (cm) : Full Thickness Without Exposed N/A N/A Classification: Support Structures Medium N/A N/A Exudate A mount: Serosanguineous N/A N/A Exudate  Type: red, brown N/A N/A Exudate Color: None Present (0%) N/A N/A Granulation Amount: Large (67-100%) N/A N/A Necrotic Amount: Eschar, Adherent Slough N/A N/A Necrotic Tissue: Fat Layer (Subcutaneous Tissue): Yes N/A N/A Exposed Structures: Fascia: No Tendon: No Muscle: No Joint: No Bone: No None N/A N/A EpithelializationSOL, BREYETTE ([id number]) [id number] Page 6 of 14 Treatment Notes Electronic Signature(s) Signed: 11/14/2022 12:36:28 PM By: Geralyn Corwin DO Entered By: Geralyn Corwin on 11/14/2022 09:03:05 -------------------------------------------------------------------------------- Multi-Disciplinary Care Plan Details Patient Name: Date of Service: [name], [name] 11/14/2022 8:00 A M Medical Record Number: [id number] Patient Account Number: [id number] Date of Birth/Sex: Treating RN: [birth year] (77 y.o. [name] Primary Care Ereka Brau: [name] Other Clinician: Referring Nollie Terlizzi: Treating Aleister Lady/Extender: Ivan Croft in Treatment: 0 Active Inactive Venous Leg Ulcer Nursing Diagnoses: Knowledge deficit related to disease process and management Potential for venous Insuffiency (use before diagnosis confirmed) Goals: Patient will maintain optimal edema control Date Initiated: 11/14/2022 Target Resolution Date: 12/15/2022 Goal Status: Active Patient/caregiver will verbalize understanding of disease process  and disease management Date Initiated: 11/14/2022 Target Resolution Date: 12/08/2022 Goal Status: Active Interventions: Assess peripheral edema status every visit. Compression as ordered Provide education on venous insufficiency Notes: Wound/Skin Impairment Nursing Diagnoses: Impaired tissue integrity Knowledge deficit related to ulceration/compromised skin integrity Goals: Patient/caregiver will verbalize understanding of skin care regimen Date Initiated: 11/14/2022 Target Resolution  Date: 12/15/2022 Goal Status: Active Ulcer/skin breakdown will have a volume reduction of 30% by week 4 Date Initiated: 11/14/2022 Target Resolution Date: 12/12/2022 Goal Status: Active Interventions: Assess patient/caregiver ability to obtain necessary supplies Assess patient/caregiver ability to perform ulcer/skin care regimen upon admission and as needed Assess ulceration(s) every visit Provide education on ulcer and skin care Notes: Electronic Signature(s) Signed: 11/14/2022 5:06:56 PM By: [name] RN, BSN Entered By: [name] on 11/14/2022 12:02:50 AADAM, ARGUDO ([id number]) [id number] Page 7 of 14 -------------------------------------------------------------------------------- Pain Assessment Details Patient Name: Date of Service: [name], [name] 11/14/2022 8:00 A M Medical Record Number: [id number] Patient Account Number: [id number] Date of Birth/Sex: Treating RN: 1945-09-06 (77 y.o. [name] Primary Care Navjot Pilgrim: [name] Other Clinician: Referring Quyen Cutsforth: Treating Stephinie Battisti/Extender: Ivan Croft in Treatment: 0 Active Problems Location of Pain Severity and Description of Pain Patient Has Paino Yes Site Locations Duration of the Pain. Constant / Intermittento Intermittent Rate the pain. Current Pain Level: 5 Character of Pain Describe the Pain: Other: tingling Pain Management and Medication Current Pain Management: Leg drop or elevation: Yes Notes pain worse when legs hang down Electronic Signature(s) Signed: 11/14/2022 5:06:56 PM By: [name] RN, BSN Entered By: [name] on 11/14/2022 08:10:34 -------------------------------------------------------------------------------- Patient/Caregiver Education Details Patient Name: Date of Service: [name], RO BERT [name] 7/30/2024andnbsp8:00 A M Medical Record Number: [id number] Patient Account Number: [id number] Date of Birth/Gender:  Treating RN: Sep 03, 1945 (77 y.o. [name] Primary Care Physician: [name] Other Clinician: Referring Physician: Treating Physician/Extender: Ivan Croft in Treatment: 0 Education Assessment CAMDON, BOCHENEK ([id number]) [id number] Page 8 of 14 Education Provided To: Patient Education Topics Provided Venous: Methods: Explain/Verbal Responses: State content correctly Wound/Skin Impairment: Methods: Explain/Verbal Responses: State content correctly Electronic Signature(s) Signed: 11/14/2022 5:06:56 PM By: [name] RN, BSN Entered By: [name] on 11/14/2022 12:03:06 -------------------------------------------------------------------------------- Wound Assessment Details Patient Name: Date of Service: [name], [name] 11/14/2022 8:00 A M Medical Record Number: [id number] Patient Account Number: [id number] Date of Birth/Sex: Treating RN: February 04, 1946 (77 y.o. [name] Primary Care Edrei Norgaard: [name] Other Clinician: Referring Shahara Hartsfield: Treating Blossie Raffel/Extender: Ivan Croft in Treatment: 0 Wound Status Wound Number: 1 Primary Venous Leg Ulcer Etiology: Wound Location: Right, Posterior Lower Leg Wound Open Wounding Event: Gradually Appeared Status: Date Acquired: 11/06/2022 Comorbid Chronic sinus problems/congestion, Asthma, Sleep Apnea, Weeks Of Treatment: 0 History: Hypertension, Type II Diabetes, Osteoarthritis, Confinement Clustered Wound: No Anxiety Photos Wound Measurements Length: (cm) 0.5 Width: (cm) 0.5 Depth: (cm) 0.1 Area: (cm) 0.196 Volume: (cm) 0.02 % Reduction in Area: % Reduction in Volume: Epithelialization: None Tunneling: No Undermining: No Wound Description Classification: Full Thickness Without Exposed Support Structures Exudate Amount: Medium Exudate Type: Serosanguineous Exudate Color: red, brown Foul Odor After Cleansing:  No Slough/Fibrino Yes Wound Bed Granulation Amount: None Present (0%) Exposed Structure Necrotic Amount: Large (67-100%) Fascia Exposed: No HEROD, CONNERTON ([id number]) [phone].pdf Page 9 of 14 Necrotic Quality: Eschar, Adherent Slough Fat Layer (Subcutaneous Tissue) Exposed: Yes Tendon Exposed: No Muscle Exposed: No Joint Exposed: No Bone Exposed: No Periwound Skin  Texture Texture Color No Abnormalities Noted: No No Abnormalities Noted: No Hemosiderin Staining: Yes Moisture No Abnormalities Noted: No Treatment Notes Wound #1 (Lower Leg) Wound Laterality: Right, Posterior Cleanser Soap and Water Discharge Instruction: May shower and wash wound with dial antibacterial soap and water prior to dressing change. Vashe 5.8 (oz) Discharge Instruction: Cleanse the wound with Vashe prior to applying a clean dressing using gauze sponges, not tissue or cotton balls. Peri-Wound Care Triamcinolone 15 (g) Discharge Instruction: Use triamcinolone 15 (g) as directed Topical Gentamicin Discharge Instruction: As directed by physician Mupirocin Ointment Discharge Instruction: Apply Mupirocin (Bactroban) as instructed Primary Dressing Maxorb Extra Ag+ Alginate Dressing, 4x4.75 (in/in) Discharge Instruction: Apply to wound bed as instructed Secondary Dressing ABD Pad, 8x10 Discharge Instruction: Apply over primary dressing as directed. Secured With [employer] 4x5 (in/yd) Discharge Instruction: Secure with Coban as directed. Kerlix Roll Sterile, 4.5x3.1 (in/yd) Discharge Instruction: Secure with Kerlix as directed. Compression Wrap Compression Stockings Add-Ons Electronic Signature(s) Signed: 11/14/2022 5:06:56 PM By: [name] RN, BSN Entered By: [name] on 11/14/2022 08:41:13 -------------------------------------------------------------------------------- Wound Assessment Details Patient Name: Date of Service: [name], [name]  11/14/2022 8:00 A M Medical Record Number: [id number] Patient Account Number: [id number] Date of Birth/Sex: Treating RN: [birth year] (77 y.o. [name] Primary Care Chabely Norby: [name] Other Clinician: Referring Greyson Peavy: Treating Adem Costlow/Extender: Ivan Croft in Treatment: 0 JAMORIE, NOGUEZ ([id number]) [id number] Page 10 of 14 Wound Status Wound Number: 2 Primary Venous Leg Ulcer Etiology: Wound Location: Right, Anterior Lower Leg Secondary Diabetic Wound/Ulcer of the Lower Extremity Wounding Event: Gradually Appeared Etiology: Date Acquired: 11/06/2022 Wound Open Weeks Of Treatment: 0 Status: Clustered Wound: No Comorbid Chronic sinus problems/congestion, Asthma, Sleep Apnea, History: Hypertension, Type II Diabetes, Osteoarthritis, Confinement Anxiety Photos Wound Measurements Length: (cm) 1.3 Width: (cm) 0.3 Depth: (cm) 0.1 Area: (cm) 0.306 Volume: (cm) 0.031 % Reduction in Area: % Reduction in Volume: Epithelialization: None Tunneling: No Undermining: No Wound Description Classification: Full Thickness Without Exposed Support Structures Exudate Amount: Medium Exudate Type: Serosanguineous Exudate Color: red, brown Foul Odor After Cleansing: No Slough/Fibrino Yes Wound Bed Granulation Amount: None Present (0%) Exposed Structure Necrotic Amount: Large (67-100%) Fascia Exposed: No Necrotic Quality: Adherent Slough Fat Layer (Subcutaneous Tissue) Exposed: Yes Tendon Exposed: No Muscle Exposed: No Joint Exposed: No Bone Exposed: No Periwound Skin Texture Texture Color No Abnormalities Noted: No No Abnormalities Noted: No Moisture No Abnormalities Noted: No Treatment Notes Wound #2 (Lower Leg) Wound Laterality: Right, Anterior Cleanser Soap and Water Discharge Instruction: May shower and wash wound with dial antibacterial soap and water prior to dressing change. Vashe 5.8 (oz) Discharge  Instruction: Cleanse the wound with Vashe prior to applying a clean dressing using gauze sponges, not tissue or cotton balls. Peri-Wound Care Triamcinolone 15 (g) Discharge Instruction: Use triamcinolone 15 (g) as directed Topical Gentamicin Discharge Instruction: As directed by physician Mupirocin Ointment Discharge Instruction: Apply Mupirocin (Bactroban) as instructed DORRIEN, OBIE ([id number]) [id number] Page 11 of 14 Primary Dressing Maxorb Extra Ag+ Alginate Dressing, 4x4.75 (in/in) Discharge Instruction: Apply to wound bed as instructed Secondary Dressing ABD Pad, 8x10 Discharge Instruction: Apply over primary dressing as directed. Secured With [employer] 4x5 (in/yd) Discharge Instruction: Secure with Coban as directed. Kerlix Roll Sterile, 4.5x3.1 (in/yd) Discharge Instruction: Secure with Kerlix as directed. Compression Wrap Compression Stockings Add-Ons Electronic Signature(s) Signed: 11/14/2022 5:06:56 PM By: [name] RN, BSN Entered By: [name] on 11/14/2022 08:42:43 -------------------------------------------------------------------------------- Wound Assessment Details Patient Name:  Date of Service: [name], [name] 11/14/2022 8:00 A M Medical Record Number: [id number] Patient Account Number: [id number] Date of Birth/Sex: Treating RN: 06/08/1945 (77 y.o. [name] Primary Care Leya Paige: [name] Other Clinician: Referring Arshiya Jakes: Treating Remie Mathison/Extender: Ivan Croft in Treatment: 0 Wound Status Wound Number: 3 Primary Venous Leg Ulcer Etiology: Wound Location: Left, Medial Lower Leg Secondary Diabetic Wound/Ulcer of the Lower Extremity Wounding Event: Gradually Appeared Etiology: Date Acquired: 11/06/2022 Wound Open Weeks Of Treatment: 0 Status: Clustered Wound: No Comorbid Chronic sinus problems/congestion, Asthma, Sleep Apnea, History: Hypertension, Type II  Diabetes, Osteoarthritis, Confinement Anxiety Photos Wound Measurements Length: (cm) 2 Width: (cm) 1.5 Depth: (cm) 0.1 Area: (cm) 2.356 Volume: (cm) 0.236 % Reduction in Area: % Reduction in Volume: Epithelialization: None Tunneling: No Undermining: No Wound Description Classification: Full Thickness Without Exposed Support Structures ZAMARION, EVERT ([id number]) Exudate Amount: Medium Exudate Type: Serosanguineous Exudate Color: red, brown Foul Odor After Cleansing: No [phone].pdf Page 12 of 14 Slough/Fibrino Yes Wound Bed Granulation Amount: None Present (0%) Exposed Structure Necrotic Amount: Large (67-100%) Fascia Exposed: No Necrotic Quality: Eschar, Adherent Slough Fat Layer (Subcutaneous Tissue) Exposed: Yes Tendon Exposed: No Muscle Exposed: No Joint Exposed: No Bone Exposed: No Periwound Skin Texture Texture Color No Abnormalities Noted: No No Abnormalities Noted: No Hemosiderin Staining: Yes Moisture No Abnormalities Noted: No Treatment Notes Wound #3 (Lower Leg) Wound Laterality: Left, Medial Cleanser Soap and Water Discharge Instruction: May shower and wash wound with dial antibacterial soap and water prior to dressing change. Vashe 5.8 (oz) Discharge Instruction: Cleanse the wound with Vashe prior to applying a clean dressing using gauze sponges, not tissue or cotton balls. Peri-Wound Care Triamcinolone 15 (g) Discharge Instruction: Use triamcinolone 15 (g) as directed Topical Gentamicin Discharge Instruction: As directed by physician Mupirocin Ointment Discharge Instruction: Apply Mupirocin (Bactroban) as instructed Primary Dressing Maxorb Extra Ag+ Alginate Dressing, 4x4.75 (in/in) Discharge Instruction: Apply to wound bed as instructed Secondary Dressing ABD Pad, 8x10 Discharge Instruction: Apply over primary dressing as directed. Secured With [employer] 4x5 (in/yd) Discharge Instruction: Secure  with Coban as directed. Kerlix Roll Sterile, 4.5x3.1 (in/yd) Discharge Instruction: Secure with Kerlix as directed. Compression Wrap Compression Stockings Add-Ons Electronic Signature(s) Signed: 11/14/2022 5:06:56 PM By: [name] RN, BSN Entered By: [name] on 11/14/2022 08:44:18 Wound Assessment Details -------------------------------------------------------------------------------- [name] ([id number]) [id number] Page 13 of 14 Patient Name: Date of Service: [name], [name] 11/14/2022 8:00 A M Medical Record Number: [id number] Patient Account Number: [id number] Date of Birth/Sex: Treating RN: 01/06/1946 (77 y.o. 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 Event: Gradually Appeared Etiology: Date Acquired: 11/06/2022 Wound Open Weeks Of Treatment: 0 Status: Clustered Wound: No Comorbid Chronic sinus problems/congestion, Asthma, Sleep Apnea, History: Hypertension, Type II Diabetes, Osteoarthritis, Confinement Anxiety Photos Wound Measurements Length: (cm) 0.5 % Reduction in Area: Width: (cm) 0.3 % Reduction in Volume: Depth: (cm) 0.1 Epithelialization: None Area: (cm) 0.118 Tunneling: No Volume: (cm) 0.012 Undermining: No Wound Description Classification: Full Thickness Without Exposed Support Structures Foul Odor After Cleansing: No Exudate Amount: Medium Slough/Fibrino Yes Exudate Type: Serosanguineous Exudate Color: red, brown Wound Bed Granulation Amount: None Present (0%) Exposed Structure Necrotic Amount: Large (67-100%) Fascia Exposed: No Necrotic Quality: Eschar, Adherent  Slough Fat Layer (Subcutaneous Tissue) Exposed:  Yes Tendon Exposed: No Muscle Exposed: No Joint Exposed: No Bone Exposed: No Periwound Skin Texture Texture Color No Abnormalities Noted: No No Abnormalities Noted: No Moisture No Abnormalities Noted: No Treatment Notes Wound #4 (Lower Leg) Wound Laterality: Left, Anterior Cleanser Soap and Water Discharge Instruction: May shower and wash wound with dial antibacterial soap and water prior to dressing change. Vashe 5.8 (oz) Discharge Instruction: Cleanse the wound with Vashe prior to applying a clean dressing using gauze sponges, not tissue or cotton balls. Peri-Wound Care Triamcinolone 15 (g) Discharge Instruction: Use triamcinolone 15 (g) as directed ERRIK, BUCHANON ([id number]) [phone].pdf Page 14 of 14 Topical Gentamicin Discharge Instruction: As directed by physician Mupirocin Ointment Discharge Instruction: Apply Mupirocin (Bactroban) as instructed Primary Dressing Maxorb Extra Ag+ Alginate Dressing, 4x4.75 (in/in) Discharge Instruction: Apply to wound bed as instructed Secondary Dressing ABD Pad, 8x10 Discharge Instruction: Apply over primary dressing as directed. Secured With [employer] 4x5 (in/yd) Discharge Instruction: Secure with Coban as directed. Kerlix Roll Sterile, 4.5x3.1 (in/yd) Discharge Instruction: Secure with Kerlix as directed. Compression Wrap Compression Stockings Add-Ons Electronic Signature(s) Signed: 11/14/2022 5:06:56 PM By: [name] RN, BSN Entered By: [name] on 11/14/2022 08:45:39 -------------------------------------------------------------------------------- Vitals Details Patient Name: Date of Service: [name], [name] 11/14/2022 8:00 A M Medical Record Number: [id number] Patient Account Number: [id number] Date of Birth/Sex: Treating RN: 08/30/1945 (77 y.o. [name] Primary Care Markel Mergenthaler: [name] Other Clinician: Referring Makilah Dowda: Treating Sakai Heinle/Extender: Ivan Croft in Treatment: 0 Vital Signs Time Taken: 08:30 Temperature (F): 98.0 Pulse (bpm): 53 Respiratory Rate (breaths/min): 18 Blood Pressure (mmHg): 156/81 Capillary Blood Glucose (mg/dl): 660 Reference Range: 80 - 120 mg / dl Electronic Signature(s) Signed: 11/14/2022 5:06:56 PM By: [name] RN, BSN Entered By: [name] on 11/14/2022 08:57:32

## 2022-11-15 NOTE — Progress Notes (Signed)
CANE, NAEEM ([id number]) [id number] Page 1 of 10 Visit Report for 11/14/2022 Chief Complaint Document Details Patient Name: Date of Service: Donald Jones, [name] 11/14/2022 8:00 A M Medical Record Number: [id number] Patient Account Number: [id number] Date of Birth/Sex: Treating RN: May 13, 1945 (77 y.o. M) Primary Care Provider: [name] Other Clinician: Referring Provider: Treating Provider/Extender: Ivan Croft in Treatment: 0 Information Obtained from: Patient Chief Complaint 11/14/2022; bilateral lower extremity wounds Electronic Signature(s) Signed: 11/14/2022 12:36:28 PM By: Geralyn Corwin DO Entered By: Geralyn Corwin on 11/14/2022 09:03:38 -------------------------------------------------------------------------------- HPI Details Patient Name: Date of Service: Donald Jones, [name] 11/14/2022 8:00 A M Medical Record Number: [id number] Patient Account Number: [id number] Date of Birth/Sex: Treating RN: 1945/05/31 (76 y.o. M) Primary Care Provider: [name] Other Clinician: Referring Provider: Treating Provider/Extender: Ivan Croft in Treatment: 0 History of Present Illness HPI Description: 11/14/2022 ABI Left: 0.96 and ABI Right: 1.03 11/14/2022 [name] is a 77 year old male with a past medical history of controlled type 2 diabetes on oral agents, hypertension, OSA and venous insufficiency that presents to the clinic for a 1-31-month history of increased swelling to the lower extremities with wounds. He visited urgent care on 7/20 for this issue. He was started on doxycycline for potential cellulitis and and is currently taking the medication. He does not have compression stockings. He states that the wounds on his legs happened spontaneously. He has been using soap and water and keeping the areas covered. He currently denies signs of infection. Electronic Signature(s) Signed: 11/14/2022  12:36:28 PM By: Geralyn Corwin DO Entered By: Geralyn Corwin on 11/14/2022 09:09:14 -------------------------------------------------------------------------------- Physical Exam Details Patient Name: Date of Service: Donald Jones, [name] 11/14/2022 8:00 A M Medical Record Number: [id number] Patient Account Number: [id number] Date of Birth/Sex: Treating RN: 1945/11/20 (77 y.o. M) Primary Care Provider: [name] Other Clinician: THRISTAN, Donald Jones ([id number]) [id number] Page 2 of 10 Referring Provider: Treating Provider/Extender: Ivan Croft in Treatment: 0 Constitutional respirations regular, non-labored and within target range for patient.. Cardiovascular 2+ dorsalis pedis/posterior tibialis pulses. Psychiatric pleasant and cooperative. Notes Scattered open wounds limited to skin breakdown to the lower extremities bilaterally. No signs of surrounding infection including increased warmth, erythema or purulent drainage. Significant venous stasis dermatitis to the legs bilaterally. Varicosities noted. Electronic Signature(s) Signed: 11/14/2022 12:36:28 PM By: Geralyn Corwin DO Entered By: Geralyn Corwin on 11/14/2022 09:09:51 -------------------------------------------------------------------------------- Physician Orders Details Patient Name: Date of Service: Donald Jones, [name] 11/14/2022 8:00 A M Medical Record Number: [id number] Patient Account Number: [id number] Date of Birth/Sex: Treating RN: 04-07-1946 (77 y.o. [name] Primary Care Provider: [name] Other Clinician: Referring Provider: Treating Provider/Extender: Ivan Croft in Treatment: 0 Verbal / Phone Orders: No Diagnosis Coding ICD-10 Coding Code Description I87.313 Chronic venous hypertension (idiopathic) with ulcer of bilateral lower extremity S81.802A Unspecified open wound, left lower leg, initial encounter S81.801A  Unspecified open wound, right lower leg, initial encounter E11.622 Type 2 diabetes mellitus with other skin ulcer Follow-up Appointments ppointment in 1 week. - Dr. Mikey Bussing - Room 9 11/21/22 @ 3:00pm Return A Anesthetic Wound #1 Right,Posterior Lower Leg (In clinic) Topical Lidocaine 5% applied to wound bed Wound #2 Right,Anterior Lower Leg (In clinic) Topical Lidocaine 5% applied to wound bed Wound #3 Left,Medial Lower Leg (In clinic) Topical Lidocaine 5% applied to wound bed Wound #4 Left,Anterior Lower Leg (In clinic) Topical Lidocaine 5% applied to wound  bed Bathing/ Shower/ Hygiene May shower with protection but do not get wound dressing(s) wet. Protect dressing(s) with water repellant cover (for example, large plastic bag) or a cast cover and may then take shower. - may purchase cast protector from [facility], [facility], or [employer] Edema Control - Lymphedema / SCD / Other Bilateral Lower Extremities Elevate legs to the level of the heart or above for 30 minutes daily and/or when sitting for 3-4 times a day throughout the day. Avoid standing for long periods of time. Exercise regularly Moisturize legs daily. Wound Treatment Donald Jones, Donald Jones ([id number]) [id number] Page 3 of 10 Wound #1 - Lower Leg Wound Laterality: Right, Posterior Cleanser: Soap and Water 1 x Per Week/15 Days Discharge Instructions: May shower and wash wound with dial antibacterial soap and water prior to dressing change. Cleanser: Vashe 5.8 (oz) 1 x Per Week/15 Days Discharge Instructions: Cleanse the wound with Vashe prior to applying a clean dressing using gauze sponges, not tissue or cotton balls. Peri-Wound Care: Triamcinolone 15 (g) 1 x Per Week/15 Days Discharge Instructions: Use triamcinolone 15 (g) as directed Topical: Gentamicin 1 x Per Week/15 Days Discharge Instructions: As directed by physician Topical: Mupirocin Ointment 1 x Per Week/15 Days Discharge Instructions: Apply  Mupirocin (Bactroban) as instructed Prim Dressing: Maxorb Extra Ag+ Alginate Dressing, 4x4.75 (in/in) 1 x Per Week/15 Days ary Discharge Instructions: Apply to wound bed as instructed Secondary Dressing: ABD Pad, 8x10 1 x Per Week/15 Days Discharge Instructions: Apply over primary dressing as directed. Secured With: Coban Self-Adherent Wrap 4x5 (in/yd) 1 x Per Week/15 Days Discharge Instructions: Secure with Coban as directed. Secured With: [employer], 4.5x3.1 (in/yd) 1 x Per Week/15 Days Discharge Instructions: Secure with Kerlix as directed. Wound #2 - Lower Leg Wound Laterality: Right, Anterior Cleanser: Soap and Water 1 x Per Week/15 Days Discharge Instructions: May shower and wash wound with dial antibacterial soap and water prior to dressing change. Cleanser: Vashe 5.8 (oz) 1 x Per Week/15 Days Discharge Instructions: Cleanse the wound with Vashe prior to applying a clean dressing using gauze sponges, not tissue or cotton balls. Peri-Wound Care: Triamcinolone 15 (g) 1 x Per Week/15 Days Discharge Instructions: Use triamcinolone 15 (g) as directed Topical: Gentamicin 1 x Per Week/15 Days Discharge Instructions: As directed by physician Topical: Mupirocin Ointment 1 x Per Week/15 Days Discharge Instructions: Apply Mupirocin (Bactroban) as instructed Prim Dressing: Maxorb Extra Ag+ Alginate Dressing, 4x4.75 (in/in) 1 x Per Week/15 Days ary Discharge Instructions: Apply to wound bed as instructed Secondary Dressing: ABD Pad, 8x10 1 x Per Week/15 Days Discharge Instructions: Apply over primary dressing as directed. Secured With: Coban Self-Adherent Wrap 4x5 (in/yd) 1 x Per Week/15 Days Discharge Instructions: Secure with Coban as directed. Secured With: [employer], 4.5x3.1 (in/yd) 1 x Per Week/15 Days Discharge Instructions: Secure with Kerlix as directed. Wound #3 - Lower Leg Wound Laterality: Left, Medial Cleanser: Soap and Water 1 x Per Week/15 Days Discharge  Instructions: May shower and wash wound with dial antibacterial soap and water prior to dressing change. Cleanser: Vashe 5.8 (oz) 1 x Per Week/15 Days Discharge Instructions: Cleanse the wound with Vashe prior to applying a clean dressing using gauze sponges, not tissue or cotton balls. Peri-Wound Care: Triamcinolone 15 (g) 1 x Per Week/15 Days Discharge Instructions: Use triamcinolone 15 (g) as directed Topical: Gentamicin 1 x Per Week/15 Days Discharge Instructions: As directed by physician Topical: Mupirocin Ointment 1 x Per Week/15 Days Discharge Instructions: Apply Mupirocin (Bactroban) as instructed Prim Dressing: Maxorb  Extra Ag+ Alginate Dressing, 4x4.75 (in/in) 1 x Per Week/15 Days ary Discharge Instructions: Apply to wound bed as instructed Secondary Dressing: ABD Pad, 8x10 1 x Per Week/15 Days Discharge Instructions: Apply over primary dressing as directed. Donald Jones, Donald Jones ([id number]) [id number] Page 4 of 10 Secured With: Coban Self-Adherent Wrap 4x5 (in/yd) 1 x Per Week/15 Days Discharge Instructions: Secure with Coban as directed. Secured With: [employer], 4.5x3.1 (in/yd) 1 x Per Week/15 Days Discharge Instructions: Secure with Kerlix as directed. Wound #4 - Lower Leg Wound Laterality: Left, Anterior Cleanser: Soap and Water 1 x Per Week/15 Days Discharge Instructions: May shower and wash wound with dial antibacterial soap and water prior to dressing change. Cleanser: Vashe 5.8 (oz) 1 x Per Week/15 Days Discharge Instructions: Cleanse the wound with Vashe prior to applying a clean dressing using gauze sponges, not tissue or cotton balls. Peri-Wound Care: Triamcinolone 15 (g) 1 x Per Week/15 Days Discharge Instructions: Use triamcinolone 15 (g) as directed Topical: Gentamicin 1 x Per Week/15 Days Discharge Instructions: As directed by physician Topical: Mupirocin Ointment 1 x Per Week/15 Days Discharge Instructions: Apply Mupirocin  (Bactroban) as instructed Prim Dressing: Maxorb Extra Ag+ Alginate Dressing, 4x4.75 (in/in) 1 x Per Week/15 Days ary Discharge Instructions: Apply to wound bed as instructed Secondary Dressing: ABD Pad, 8x10 1 x Per Week/15 Days Discharge Instructions: Apply over primary dressing as directed. Secured With: Coban Self-Adherent Wrap 4x5 (in/yd) 1 x Per Week/15 Days Discharge Instructions: Secure with Coban as directed. Secured With: [employer], 4.5x3.1 (in/yd) 1 x Per Week/15 Days Discharge Instructions: Secure with Kerlix as directed. Patient Medications llergies: metformin, Sulfa (Sulfonamide Antibiotics) A Notifications Medication Indication Start End 11/14/2022 lidocaine DOSE topical 5 % ointment - ointment topical once daily Electronic Signature(s) Signed: 11/14/2022 12:36:28 PM By: Geralyn Corwin DO Entered By: Geralyn Corwin on 11/14/2022 09:10:00 -------------------------------------------------------------------------------- Problem List Details Patient Name: Date of Service: Donald Jones, [name] 11/14/2022 8:00 A M Medical Record Number: [id number] Patient Account Number: [id number] Date of Birth/Sex: Treating RN: May 07, 1945 (76 y.o. M) Primary Care Provider: [name] Other Clinician: Referring Provider: Treating Provider/Extender: Ivan Croft in Treatment: 0 Active Problems ICD-10 Encounter Code Description Active Date MDM Diagnosis I87.313 Chronic venous hypertension (idiopathic) with ulcer of bilateral lower extremity 11/14/2022 No Yes JERYL, GALAZ ([id number]) [phone].pdf Page 5 of 10 L97.811 Non-pressure chronic ulcer of other part of right lower leg limited to breakdown 11/14/2022 No Yes of skin L97.821 Non-pressure chronic ulcer of other part of left lower leg limited to breakdown 11/14/2022 No Yes of skin E11.622 Type 2 diabetes mellitus with other skin ulcer 11/14/2022 No Yes Inactive  Problems Resolved Problems Electronic Signature(s) Signed: 11/14/2022 12:36:28 PM By: Geralyn Corwin DO Entered By: Geralyn Corwin on 11/14/2022 09:03:00 -------------------------------------------------------------------------------- Progress Note Details Patient Name: Date of Service: Donald Jones, [name] 11/14/2022 8:00 A M Medical Record Number: [id number] Patient Account Number: [id number] Date of Birth/Sex: Treating RN: [birth year] (77 y.o. M) Primary Care Provider: [name] Other Clinician: Referring Provider: Treating Provider/Extender: Ivan Croft in Treatment: 0 Subjective Chief Complaint Information obtained from Patient 11/14/2022; bilateral lower extremity wounds History of Present Illness (HPI) 11/14/2022 ABI Left: 0.96 and ABI Right: 1.03 11/14/2022 Donald Jones is a 77 year old male with a past medical history of controlled type 2 diabetes on oral agents, hypertension, OSA and venous insufficiency that presents to the clinic for a 1-16-month history of increased swelling to the lower extremities  with wounds. He visited urgent care on 7/20 for this issue. He was started on doxycycline for potential cellulitis and and is currently taking the medication. He does not have compression stockings. He states that the wounds on his legs happened spontaneously. He has been using soap and water and keeping the areas covered. He currently denies signs of infection. Patient History Allergies metformin (Severity: Mild, Reaction: GI symptoms), Sulfa (Sulfonamide Antibiotics) Family History Cancer - Siblings, Diabetes - Mother,Siblings, Heart Disease - Mother,Siblings, Hypertension - Father,Mother,Siblings, Kidney Disease - Siblings, Stroke - Father. Social History Never smoker, Marital Status - Married, Alcohol Use - Never, Drug Use - No History, Caffeine Use - Daily. Medical History Ear/Nose/Mouth/Throat Patient has history of Chronic sinus  problems/congestion Respiratory Patient has history of Asthma, Sleep Apnea Cardiovascular Patient has history of Hypertension Endocrine Patient has history of Type II Diabetes Musculoskeletal Patient has history of Osteoarthritis TERRIL, JUNKINS ([id number]) [id number] Page 6 of 10 Psychiatric Patient has history of Confinement Anxiety - general anxiety Blood sugar is tested. Medical A Surgical History Notes nd Cardiovascular high cholesterol, Gastrointestinal GERD Endocrine subclinical hyperthyroidism Genitourinary hx kidney stones Integumentary (Skin) chronic dermatitis Musculoskeletal degenerative joint right foot drop Review of Systems (ROS) Constitutional Symptoms (General Health) Denies complaints or symptoms of Fatigue, Fever, Chills, Marked Weight Change. Eyes Complains or has symptoms of Glasses / Contacts. Ear/Nose/Mouth/Throat Denies complaints or symptoms of Chronic sinus problems or rhinitis. Integumentary (Skin) Complains or has symptoms of Wounds - BLE. Neurologic Denies complaints or symptoms of Numbness/parasthesias. Objective Constitutional respirations regular, non-labored and within target range for patient.. Vitals Time Taken: 8:30 AM, Temperature: 98.0 F, Pulse: 53 bpm, Respiratory Rate: 18 breaths/min, Blood Pressure: 156/81 mmHg, Capillary Blood Glucose: 134 mg/dl. Cardiovascular 2+ dorsalis pedis/posterior tibialis pulses. Psychiatric pleasant and cooperative. General Notes: Scattered open wounds limited to skin breakdown to the lower extremities bilaterally. No signs of surrounding infection including increased warmth, erythema or purulent drainage. Significant venous stasis dermatitis to the legs bilaterally. Varicosities noted. Integumentary (Hair, Skin) Wound #1 status is Open. Original cause of wound was Gradually Appeared. The date acquired was: 11/06/2022. The wound is located on the Right,Posterior Lower  Leg. The wound measures 0.5cm length x 0.5cm width x 0.1cm depth; 0.196cm^2 area and 0.02cm^3 volume. There is Fat Layer (Subcutaneous Tissue) exposed. There is no tunneling or undermining noted. There is a medium amount of serosanguineous drainage noted. There is no granulation within the wound bed. There is a large (67-100%) amount of necrotic tissue within the wound bed including Eschar and Adherent Slough. The periwound skin appearance exhibited: Hemosiderin Staining. Wound #2 status is Open. Original cause of wound was Gradually Appeared. The date acquired was: 11/06/2022. The wound is located on the Right,Anterior Lower Leg. The wound measures 1.3cm length x 0.3cm width x 0.1cm depth; 0.306cm^2 area and 0.031cm^3 volume. There is Fat Layer (Subcutaneous Tissue) exposed. There is no tunneling or undermining noted. There is a medium amount of serosanguineous drainage noted. There is no granulation within the wound bed. There is a large (67-100%) amount of necrotic tissue within the wound bed including Adherent Slough. Wound #3 status is Open. Original cause of wound was Gradually Appeared. The date acquired was: 11/06/2022. The wound is located on the Left,Medial Lower Leg. The wound measures 2cm length x 1.5cm width x 0.1cm depth; 2.356cm^2 area and 0.236cm^3 volume. There is Fat Layer (Subcutaneous Tissue) exposed. There is no tunneling or undermining noted. There is a medium amount of serosanguineous drainage noted. There is  no granulation within the wound bed. There is a large (67-100%) amount of necrotic tissue within the wound bed including Eschar and Adherent Slough. The periwound skin appearance exhibited: Hemosiderin Staining. Wound #4 status is Open. Original cause of wound was Gradually Appeared. The date acquired was: 11/06/2022. The wound is located on the Left,Anterior Lower Leg. The wound measures 0.5cm length x 0.3cm width x 0.1cm depth; 0.118cm^2 area and 0.012cm^3 volume. There is  Fat Layer (Subcutaneous Tissue) exposed. There is no tunneling or undermining noted. There is a medium amount of serosanguineous drainage noted. There is no granulation within the wound bed. There is a large (67-100%) amount of necrotic tissue within the wound bed including Eschar and Adherent Slough. Assessment Active Problems ICD-10 Chronic venous hypertension (idiopathic) with ulcer of bilateral lower extremity Donald Jones, Donald Jones ([id number]) [id number] Page 7 of 10 Non-pressure chronic ulcer of other part of right lower leg limited to breakdown of skin Non-pressure chronic ulcer of other part of left lower leg limited to breakdown of skin Type 2 diabetes mellitus with other skin ulcer Patient presents with a 1-44-month history of scattered open wounds to his lower extremities bilaterally secondary to venous insufficiency. We discussed the importance of edema control for his healing and I recommended an in office wrap and he was agreeable to this. His ABIs were normal with palpable dorsalis and posterior tibial pulses. We will start with Kerlix/Coban. Under the wrap we will start with antibiotic ointment triamcinolone cream and silver alginate. Also discussed wearing compression stockings daily once his wounds heal. We gave him information to order these today. He knows to not get the wrap wet or keep this on for more than 7 days. Follow-up in 1 week. Plan Follow-up Appointments: Return Appointment in 1 week. - Dr. Mikey Bussing - Room 9 11/21/22 @ 3:00pm Anesthetic: Wound #1 Right,Posterior Lower Leg: (In clinic) Topical Lidocaine 5% applied to wound bed Wound #2 Right,Anterior Lower Leg: (In clinic) Topical Lidocaine 5% applied to wound bed Wound #3 Left,Medial Lower Leg: (In clinic) Topical Lidocaine 5% applied to wound bed Wound #4 Left,Anterior Lower Leg: (In clinic) Topical Lidocaine 5% applied to wound bed Bathing/ Shower/ Hygiene: May shower with protection  but do not get wound dressing(s) wet. Protect dressing(s) with water repellant cover (for example, large plastic bag) or a cast cover and may then take shower. - may purchase cast protector from [facility], [facility], or [employer] Edema Control - Lymphedema / SCD / Other: Elevate legs to the level of the heart or above for 30 minutes daily and/or when sitting for 3-4 times a day throughout the day. Avoid standing for long periods of time. Exercise regularly Moisturize legs daily. The following medication(s) was prescribed: lidocaine topical 5 % ointment ointment topical once daily was prescribed at facility WOUND #1: - Lower Leg Wound Laterality: Right, Posterior Cleanser: Soap and Water 1 x Per Week/15 Days Discharge Instructions: May shower and wash wound with dial antibacterial soap and water prior to dressing change. Cleanser: Vashe 5.8 (oz) 1 x Per Week/15 Days Discharge Instructions: Cleanse the wound with Vashe prior to applying a clean dressing using gauze sponges, not tissue or cotton balls. Peri-Wound Care: Triamcinolone 15 (g) 1 x Per Week/15 Days Discharge Instructions: Use triamcinolone 15 (g) as directed Topical: Gentamicin 1 x Per Week/15 Days Discharge Instructions: As directed by physician Topical: Mupirocin Ointment 1 x Per Week/15 Days Discharge Instructions: Apply Mupirocin (Bactroban) as instructed Prim Dressing: Maxorb Extra Ag+ Alginate Dressing, 4x4.75 (in/in) 1 x Per  Week/15 Days ary Discharge Instructions: Apply to wound bed as instructed Secondary Dressing: ABD Pad, 8x10 1 x Per Week/15 Days Discharge Instructions: Apply over primary dressing as directed. Secured With: Coban Self-Adherent Wrap 4x5 (in/yd) 1 x Per Week/15 Days Discharge Instructions: Secure with Coban as directed. Secured With: [employer], 4.5x3.1 (in/yd) 1 x Per Week/15 Days Discharge Instructions: Secure with Kerlix as directed. WOUND #2: - Lower Leg Wound Laterality: Right, Anterior Cleanser:  Soap and Water 1 x Per Week/15 Days Discharge Instructions: May shower and wash wound with dial antibacterial soap and water prior to dressing change. Cleanser: Vashe 5.8 (oz) 1 x Per Week/15 Days Discharge Instructions: Cleanse the wound with Vashe prior to applying a clean dressing using gauze sponges, not tissue or cotton balls. Peri-Wound Care: Triamcinolone 15 (g) 1 x Per Week/15 Days Discharge Instructions: Use triamcinolone 15 (g) as directed Topical: Gentamicin 1 x Per Week/15 Days Discharge Instructions: As directed by physician Topical: Mupirocin Ointment 1 x Per Week/15 Days Discharge Instructions: Apply Mupirocin (Bactroban) as instructed Prim Dressing: Maxorb Extra Ag+ Alginate Dressing, 4x4.75 (in/in) 1 x Per Week/15 Days ary Discharge Instructions: Apply to wound bed as instructed Secondary Dressing: ABD Pad, 8x10 1 x Per Week/15 Days Discharge Instructions: Apply over primary dressing as directed. Secured With: Coban Self-Adherent Wrap 4x5 (in/yd) 1 x Per Week/15 Days Discharge Instructions: Secure with Coban as directed. Secured With: [employer], 4.5x3.1 (in/yd) 1 x Per Week/15 Days Discharge Instructions: Secure with Kerlix as directed. WOUND #3: - Lower Leg Wound Laterality: Left, Medial Cleanser: Soap and Water 1 x Per Week/15 Days Discharge Instructions: May shower and wash wound with dial antibacterial soap and water prior to dressing change. Cleanser: Vashe 5.8 (oz) 1 x Per Week/15 Days Discharge Instructions: Cleanse the wound with Vashe prior to applying a clean dressing using gauze sponges, not tissue or cotton balls. Peri-Wound Care: Triamcinolone 15 (g) 1 x Per Week/15 Days Discharge Instructions: Use triamcinolone 15 (g) as directed Topical: Gentamicin 1 x Per Week/15 Days Discharge Instructions: As directed by physician Topical: Mupirocin Ointment 1 x Per Week/15 Days Discharge Instructions: Apply Mupirocin (Bactroban) as instructed Prim Dressing:  Maxorb Extra Ag+ Alginate Dressing, 4x4.75 (in/in) 1 x Per Week/15 Days ary Discharge Instructions: Apply to wound bed as instructed Secondary Dressing: ABD Pad, 8x10 1 x Per Week/15 Days Donald Jones, Donald Jones ([id number]) [id number] Page 8 of 10 Discharge Instructions: Apply over primary dressing as directed. Secured With: Coban Self-Adherent Wrap 4x5 (in/yd) 1 x Per Week/15 Days Discharge Instructions: Secure with Coban as directed. Secured With: [employer], 4.5x3.1 (in/yd) 1 x Per Week/15 Days Discharge Instructions: Secure with Kerlix as directed. WOUND #4: - Lower Leg Wound Laterality: Left, Anterior Cleanser: Soap and Water 1 x Per Week/15 Days Discharge Instructions: May shower and wash wound with dial antibacterial soap and water prior to dressing change. Cleanser: Vashe 5.8 (oz) 1 x Per Week/15 Days Discharge Instructions: Cleanse the wound with Vashe prior to applying a clean dressing using gauze sponges, not tissue or cotton balls. Peri-Wound Care: Triamcinolone 15 (g) 1 x Per Week/15 Days Discharge Instructions: Use triamcinolone 15 (g) as directed Topical: Gentamicin 1 x Per Week/15 Days Discharge Instructions: As directed by physician Topical: Mupirocin Ointment 1 x Per Week/15 Days Discharge Instructions: Apply Mupirocin (Bactroban) as instructed Prim Dressing: Maxorb Extra Ag+ Alginate Dressing, 4x4.75 (in/in) 1 x Per Week/15 Days ary Discharge Instructions: Apply to wound bed as instructed Secondary Dressing: ABD Pad, 8x10 1 x Per  Week/15 Days Discharge Instructions: Apply over primary dressing as directed. Secured With: Coban Self-Adherent Wrap 4x5 (in/yd) 1 x Per Week/15 Days Discharge Instructions: Secure with Coban as directed. Secured With: [employer], 4.5x3.1 (in/yd) 1 x Per Week/15 Days Discharge Instructions: Secure with Kerlix as directed. 1. Silver alginate 2. Antibiotic ointment and triamcinolone cream 3.  Kerlix/Coban to lower extremities bilaterally 4. Follow-up in 1 week 5. Information to order compression stockings given to the patient Electronic Signature(s) Signed: 11/14/2022 12:36:28 PM By: Geralyn Corwin DO Entered By: Geralyn Corwin on 11/14/2022 09:11:34 -------------------------------------------------------------------------------- HxROS Details Patient Name: Date of Service: Donald Jones, [name] 11/14/2022 8:00 A M Medical Record Number: [id number] Patient Account Number: [id number] Date of Birth/Sex: Treating RN: 1945/08/02 (77 y.o. [name] Primary Care Provider: [name] Other Clinician: Referring Provider: Treating Provider/Extender: Ivan Croft in Treatment: 0 Constitutional Symptoms (General Health) Complaints and Symptoms: Negative for: Fatigue; Fever; Chills; Marked Weight Change Eyes Complaints and Symptoms: Positive for: Glasses / Contacts Ear/Nose/Mouth/Throat Complaints and Symptoms: Negative for: Chronic sinus problems or rhinitis Medical History: Positive for: Chronic sinus problems/congestion Integumentary (Skin) Complaints and Symptoms: Positive for: Wounds - BLE Medical History: Past Medical History Notes: chronic dermatitis Donald Jones, Donald Jones ([id number]) [id number] Page 9 of 10 Neurologic Complaints and Symptoms: Negative for: Numbness/parasthesias Hematologic/Lymphatic Respiratory Medical History: Positive for: Asthma; Sleep Apnea Cardiovascular Medical History: Positive for: Hypertension Past Medical History Notes: high cholesterol, Gastrointestinal Medical History: Past Medical History Notes: GERD Endocrine Medical History: Positive for: Type II Diabetes Past Medical History Notes: subclinical hyperthyroidism Time with diabetes: 5-64yrs Blood sugar tested every day: Yes Tested : Genitourinary Medical History: Past Medical History Notes: hx kidney  stones Immunological Musculoskeletal Medical History: Positive for: Osteoarthritis Past Medical History Notes: degenerative joint right foot drop Oncologic Psychiatric Medical History: Positive for: Confinement Anxiety - general anxiety HBO Extended History Items Ear/Nose/Mouth/Throat: Chronic sinus problems/congestion Immunizations Pneumococcal Vaccine: Received Pneumococcal Vaccination: Yes Received Pneumococcal Vaccination On or After 60th Birthday: Yes Implantable Devices None Family and Social History Cancer: Yes - Siblings; Diabetes: Yes - Mother,Siblings; Heart Disease: Yes - Mother,Siblings; Hypertension: Yes - Father,Mother,Siblings; Kidney Disease: Yes - Siblings; Stroke: Yes - Father; Never smoker; Marital Status - Married; Alcohol Use: Never; Drug Use: No History; Caffeine Use: Daily; Financial Concerns: No; Food, Clothing or Shelter Needs: No; Support System Lacking: No; Transportation Concerns: No Electronic Signature(s) Donald Jones, Donald Jones ([id number]) [id number] Page 10 of 10 Signed: 11/14/2022 12:36:28 PM By: Geralyn Corwin DO Signed: 11/14/2022 5:06:56 PM By: [name] RN, BSN Entered By: [name] on 11/14/2022 08:23:45 -------------------------------------------------------------------------------- SuperBill Details Patient Name: Date of Service: Donald Jones, [name] 11/14/2022 Medical Record Number: [id number] Patient Account Number: [id number] Date of Birth/Sex: Treating RN: 02/12/1946 (77 y.o. M) Primary Care Provider: [name] Other Clinician: Referring Provider: Treating Provider/Extender: Ivan Croft in Treatment: 0 Diagnosis Coding ICD-10 Codes Code Description [id number] Chronic venous hypertension (idiopathic) with ulcer of bilateral lower extremity L97.811 Non-pressure chronic ulcer of other part of right lower leg limited to breakdown of skin L97.821 Non-pressure chronic ulcer of other  part of left lower leg limited to breakdown of skin E11.622 Type 2 diabetes mellitus with other skin ulcer Facility Procedures : CPT4 Code: 72536644 Description: 03474 - WOUND CARE VISIT-LEV 5 EST PT Modifier: Quantity: 1 Physician Procedures : CPT4 Code Description Modifier [id number] - WC PHYS LEVEL 4 - NEW PT ICD-10 Diagnosis Description I87.313 Chronic venous hypertension (idiopathic) with ulcer  of bilateral lower extremity L97.811 Non-pressure chronic ulcer of other part of right  lower leg limited to breakdown of skin L97.821 Non-pressure chronic ulcer of other part of left lower leg limited to breakdown of skin E11.622 Type 2 diabetes mellitus with other skin ulcer Quantity: 1 Electronic Signature(s) Signed: 11/14/2022 12:36:28 PM By: Geralyn Corwin DO Signed: 11/14/2022 5:06:56 PM By: [name] RN, BSN Entered By: [name] on 11/14/2022 12:06:04

## 2022-11-18 ENCOUNTER — Other Ambulatory Visit: Payer: Self-pay | Admitting: Internal Medicine

## 2022-11-21 ENCOUNTER — Encounter (HOSPITAL_BASED_OUTPATIENT_CLINIC_OR_DEPARTMENT_OTHER): Payer: Medicare Other | Attending: Internal Medicine | Admitting: Internal Medicine

## 2022-11-21 DIAGNOSIS — J45909 Unspecified asthma, uncomplicated: Secondary | ICD-10-CM | POA: Diagnosis not present

## 2022-11-21 DIAGNOSIS — L97811 Non-pressure chronic ulcer of other part of right lower leg limited to breakdown of skin: Secondary | ICD-10-CM | POA: Diagnosis not present

## 2022-11-21 DIAGNOSIS — E7800 Pure hypercholesterolemia, unspecified: Secondary | ICD-10-CM | POA: Diagnosis not present

## 2022-11-21 DIAGNOSIS — I10 Essential (primary) hypertension: Secondary | ICD-10-CM | POA: Insufficient documentation

## 2022-11-21 DIAGNOSIS — I872 Venous insufficiency (chronic) (peripheral): Secondary | ICD-10-CM | POA: Diagnosis not present

## 2022-11-21 DIAGNOSIS — I890 Lymphedema, not elsewhere classified: Secondary | ICD-10-CM | POA: Diagnosis not present

## 2022-11-21 DIAGNOSIS — G4733 Obstructive sleep apnea (adult) (pediatric): Secondary | ICD-10-CM | POA: Diagnosis not present

## 2022-11-21 DIAGNOSIS — Z833 Family history of diabetes mellitus: Secondary | ICD-10-CM | POA: Insufficient documentation

## 2022-11-21 DIAGNOSIS — I87313 Chronic venous hypertension (idiopathic) with ulcer of bilateral lower extremity: Secondary | ICD-10-CM

## 2022-11-21 DIAGNOSIS — L97821 Non-pressure chronic ulcer of other part of left lower leg limited to breakdown of skin: Secondary | ICD-10-CM | POA: Diagnosis not present

## 2022-11-21 DIAGNOSIS — M1990 Unspecified osteoarthritis, unspecified site: Secondary | ICD-10-CM | POA: Diagnosis not present

## 2022-11-21 DIAGNOSIS — E11622 Type 2 diabetes mellitus with other skin ulcer: Secondary | ICD-10-CM | POA: Diagnosis not present

## 2022-11-23 ENCOUNTER — Telehealth: Payer: Self-pay | Admitting: Internal Medicine

## 2022-11-23 NOTE — Telephone Encounter (Signed)
Pt states he was advised to let pcp know if he had problems with his genitals after starting JARDIANCE. He states there is a burning sensation after peeing and outside the area. Please advise how to proceed.

## 2022-11-24 MED ORDER — KETOCONAZOLE 2 % EX CREA: 1.0000 | TOPICAL_CREAM | Freq: Every day | CUTANEOUS | 0 refills | Status: DC

## 2022-11-24 NOTE — Telephone Encounter (Signed)
Spoke with pt, pt states he notices today is seems to be "a little better" pt believes rash will clear up with the cream. Pt was advised to advise Dr. Drue Novel if symptoms worsen and we can get an appointment scheduled.

## 2022-11-24 NOTE — Telephone Encounter (Signed)
Advise patient:  If has very small and mild rash and some itching: Use a cream called Nizoral, I sent a prescription.  If he has severe symptoms, large rash: Stop Jardiance and let me know.  Schedule an office visit if needed.

## 2022-11-28 ENCOUNTER — Encounter (HOSPITAL_BASED_OUTPATIENT_CLINIC_OR_DEPARTMENT_OTHER): Payer: Medicare Other | Admitting: Internal Medicine

## 2022-11-28 DIAGNOSIS — L97811 Non-pressure chronic ulcer of other part of right lower leg limited to breakdown of skin: Secondary | ICD-10-CM | POA: Diagnosis not present

## 2022-11-28 DIAGNOSIS — E11622 Type 2 diabetes mellitus with other skin ulcer: Secondary | ICD-10-CM

## 2022-11-28 DIAGNOSIS — L97821 Non-pressure chronic ulcer of other part of left lower leg limited to breakdown of skin: Secondary | ICD-10-CM | POA: Diagnosis not present

## 2022-11-28 DIAGNOSIS — I890 Lymphedema, not elsewhere classified: Secondary | ICD-10-CM | POA: Diagnosis not present

## 2022-11-28 DIAGNOSIS — M1990 Unspecified osteoarthritis, unspecified site: Secondary | ICD-10-CM | POA: Diagnosis not present

## 2022-11-28 DIAGNOSIS — G4733 Obstructive sleep apnea (adult) (pediatric): Secondary | ICD-10-CM | POA: Diagnosis not present

## 2022-11-28 DIAGNOSIS — I10 Essential (primary) hypertension: Secondary | ICD-10-CM | POA: Diagnosis not present

## 2022-11-28 DIAGNOSIS — J45909 Unspecified asthma, uncomplicated: Secondary | ICD-10-CM | POA: Diagnosis not present

## 2022-11-28 DIAGNOSIS — I87313 Chronic venous hypertension (idiopathic) with ulcer of bilateral lower extremity: Secondary | ICD-10-CM

## 2022-11-28 DIAGNOSIS — I872 Venous insufficiency (chronic) (peripheral): Secondary | ICD-10-CM | POA: Diagnosis not present

## 2022-11-28 DIAGNOSIS — Z833 Family history of diabetes mellitus: Secondary | ICD-10-CM | POA: Diagnosis not present

## 2022-11-28 DIAGNOSIS — E7800 Pure hypercholesterolemia, unspecified: Secondary | ICD-10-CM | POA: Diagnosis not present

## 2022-11-28 NOTE — Progress Notes (Signed)
DELMAR, BOTH ([id number]) [id number] Page 1 of 7 Visit Report for 11/28/2022 Chief Complaint Document Details Patient Name: Date of Service: Donald Jones, [name] Donald H. 11/28/2022 2:00 PM Medical Record Number: [id number] Patient Account Number: [id number] Date of Birth/Sex: Treating RN: [birth year] (77 y.o. M) Primary Care Provider: [name] Other Clinician: Referring Provider: Treating Provider/Extender: Susette Racer Weeks in Treatment: 2 Information Obtained from: Patient Chief Complaint 11/14/2022; bilateral lower extremity wounds Electronic Signature(s) Signed: 11/28/2022 4:37:08 PM By: Geralyn Corwin DO Entered By: Geralyn Corwin on 11/28/2022 14:41:45 -------------------------------------------------------------------------------- HPI Details Patient Name: Date of Service: Donald Jones, Donald Donald H. 11/28/2022 2:00 PM Medical Record Number: [id number] Patient Account Number: [id number] Date of Birth/Sex: Treating RN: 1945/07/08 (77 y.o. M) Primary Care Provider: [name] Other Clinician: Referring Provider: Treating Provider/Extender: Susette Racer Weeks in Treatment: 2 History of Present Illness HPI Description: 11/14/2022 ABI Left: 0.96 and ABI Right: 1.03 11/14/2022 [name] is a 77 year old male with a past medical history of controlled type 2 diabetes on oral agents, hypertension, OSA and venous insufficiency that presents to the clinic for a 1-20-month history of increased swelling to the lower extremities with wounds. He visited urgent care on 7/20 for this issue. He was started on doxycycline for potential cellulitis and and is currently taking the medication. He does not have compression stockings. He states that the wounds on his legs happened spontaneously. He has been using soap and water and keeping the areas covered. He currently denies signs of infection. 8/6; patient presents for follow-up. We have been using  antibiotic ointment with silver alginate under Kerlix/Coban. He has done well with this and most of his wounds have epithelialized. He has a few scattered open areas. He has his compression stockings with him today. He has no issues or complaints. 8/13; patient presents for follow-up. Last visit we used Xeroform under Kerlix/Coban to lower extremities bilaterally. His wounds of healed. He has his compression stockings with him today. Electronic Signature(s) Signed: 11/28/2022 4:37:08 PM By: Geralyn Corwin DO Entered By: Geralyn Corwin on 11/28/2022 14:42:17 Donald Jones ([id number]) [id number] Page 2 of 7 -------------------------------------------------------------------------------- Physical Exam Details Patient Name: Date of Service: Donald Jones, Donald Donald H. 11/28/2022 2:00 PM Medical Record Number: [id number] Patient Account Number: [id number] Date of Birth/Sex: Treating RN: 01-Feb-1946 (77 y.o. M) Primary Care Provider: [name] Other Clinician: Referring Provider: Treating Provider/Extender: Susette Racer Weeks in Treatment: 2 Constitutional respirations regular, non-labored and within target range for patient.. Cardiovascular 2+ dorsalis pedis/posterior tibialis pulses. Psychiatric pleasant and cooperative. Notes Epithelization to the previous wound site. Significant hemosiderin staining noted throughout the legs. No signs of infection. Good edema control. Electronic Signature(s) Signed: 11/28/2022 4:37:08 PM By: Geralyn Corwin DO Entered By: Geralyn Corwin on 11/28/2022 14:42:58 -------------------------------------------------------------------------------- Physician Orders Details Patient Name: Date of Service: Donald Jones, Donald Donald H. 11/28/2022 2:00 PM Medical Record Number: [id number] Patient Account Number: [id number] Date of Birth/Sex: Treating RN: 09-18-1945 (77 y.o. Yates Decamp Primary Care Provider: [name] Other  Clinician: Referring Provider: Treating Provider/Extender: Darden Palmer in Treatment: 2 Verbal / Phone Orders: No Diagnosis Coding Discharge From [facility] Discharge from Wound Care Center - Congrats on healing!!!! Bathing/ Shower/ Hygiene May shower with protection but do not get wound dressing(s) wet. Protect dressing(s) with water repellant cover (for example, large plastic bag) or a cast cover and may then take shower. - may purchase cast protector from [facility], [facility],  or [employer] Edema Control - Lymphedema / SCD / Other Bilateral Lower Extremities Elevate legs to the level of the heart or above for 30 minutes daily and/or when sitting for 3-4 times a day throughout the day. Avoid standing for long periods of time. Exercise regularly Moisturize legs daily. Non Wound Condition Bilateral Lower Extremities pply the following to affected area as directed: - Ketoconazole to ankles and wrap with Kerlix (in office). Lamisil as needed at home after. A Electronic Signature(s) Signed: 11/28/2022 4:37:08 PM By: Geralyn Corwin DO Entered By: Geralyn Corwin on 11/28/2022 14:43:11 Donald Jones ([id number]) [id number] Page 3 of 7 -------------------------------------------------------------------------------- Problem List Details Patient Name: Date of Service: Donald Jones, Donald Donald H. 11/28/2022 2:00 PM Medical Record Number: [id number] Patient Account Number: [id number] Date of Birth/Sex: Treating RN: 01-02-1946 (77 y.o. Yates Decamp Primary Care Provider: [name] Other Clinician: Referring Provider: Treating Provider/Extender: Susette Racer Weeks in Treatment: 2 Active Problems ICD-10 Encounter Code Description Active Date MDM Diagnosis I87.313 Chronic venous hypertension (idiopathic) with ulcer of bilateral lower extremity 11/14/2022 No Yes L97.811 Non-pressure chronic ulcer of other part of right lower leg  limited to breakdown 11/14/2022 No Yes of skin L97.821 Non-pressure chronic ulcer of other part of left lower leg limited to breakdown 11/14/2022 No Yes of skin E11.622 Type 2 diabetes mellitus with other skin ulcer 11/14/2022 No Yes Inactive Problems Resolved Problems Electronic Signature(s) Signed: 11/28/2022 4:37:08 PM By: Geralyn Corwin DO Entered By: Geralyn Corwin on 11/28/2022 14:41:28 -------------------------------------------------------------------------------- Progress Note Details Patient Name: Date of Service: Donald Jones, Donald Donald H. 11/28/2022 2:00 PM Medical Record Number: [id number] Patient Account Number: [id number] Date of Birth/Sex: Treating RN: 1945-08-15 (77 y.o. M) Primary Care Provider: [name] Other Clinician: Referring Provider: Treating Provider/Extender: Susette Racer Weeks in Treatment: 2 Subjective Chief Complaint Information obtained from Patient 11/14/2022; bilateral lower extremity wounds History of Present Illness (HPI) 11/14/2022 ABI Left: 0.96 and ABI Right: 1.03 11/14/2022 Mr. Zyeir Boyter is a 77 year old male with a past medical history of controlled type 2 diabetes on oral agents, hypertension, OSA and venous insufficiency that presents to the clinic for a 1-39-month history of increased swelling to the lower extremities with wounds. He visited urgent care on 7/20 for this issue. He was started on doxycycline for potential cellulitis and and is currently taking the medication. He does not have compression stockings. He states that the wounds on his legs happened spontaneously. He has been using soap and water and keeping the areas covered. He currently denies signs of infection. 8/6; patient presents for follow-up. We have been using antibiotic ointment with silver alginate under Kerlix/Coban. He has done well with this and most of his wounds have epithelialized. He has a few scattered open areas. He has his compression stockings with  him today. He has no issues or complaints. JAMONTE, KLEMM ([id number]) [id number] Page 4 of 7 8/13; patient presents for follow-up. Last visit we used Xeroform under Kerlix/Coban to lower extremities bilaterally. His wounds of healed. He has his compression stockings with him today. Patient History Family History Cancer - Siblings, Diabetes - Mother,Siblings, Heart Disease - Mother,Siblings, Hypertension - Father,Mother,Siblings, Kidney Disease - Siblings, Stroke - Father. Social History Never smoker, Marital Status - Married, Alcohol Use - Never, Drug Use - No History, Caffeine Use - Daily. Medical History Ear/Nose/Mouth/Throat Patient has history of Chronic sinus problems/congestion Respiratory Patient has history of Asthma, Sleep Apnea Cardiovascular Patient has history of Hypertension Endocrine Patient has  history of Type II Diabetes Musculoskeletal Patient has history of Osteoarthritis Psychiatric Patient has history of Confinement Anxiety - general anxiety Medical A Surgical History Notes nd Cardiovascular high cholesterol, Gastrointestinal GERD Endocrine subclinical hyperthyroidism Genitourinary hx kidney stones Integumentary (Skin) chronic dermatitis Musculoskeletal degenerative joint right foot drop Objective Constitutional respirations regular, non-labored and within target range for patient.. Vitals Time Taken: 1:50 AM, Height: 65 in, Weight: 240 lbs, BMI: 39.9, Temperature: 97.8 F, Pulse: 65 bpm, Respiratory Rate: 18 breaths/min, Blood Pressure: 144/86 mmHg, Capillary Blood Glucose: 139 mg/dl. Cardiovascular 2+ dorsalis pedis/posterior tibialis pulses. Psychiatric pleasant and cooperative. General Notes: Epithelization to the previous wound site. Significant hemosiderin staining noted throughout the legs. No signs of infection. Good edema control. Integumentary (Hair, Skin) Wound #1 status is Healed - Epithelialized. Original  cause of wound was Gradually Appeared. The date acquired was: 11/06/2022. The wound has been in treatment 2 weeks. The wound is located on the Right,Posterior Lower Leg. The wound measures 0cm length x 0cm width x 0cm depth; 0cm^2 area and 0cm^3 volume. There is a medium amount of serosanguineous drainage noted. Wound #2 status is Healed - Epithelialized. Original cause of wound was Gradually Appeared. The date acquired was: 11/06/2022. The wound has been in treatment 2 weeks. The wound is located on the Right,Anterior Lower Leg. The wound measures 0cm length x 0cm width x 0cm depth; 0cm^2 area and 0cm^3 volume. There is a medium amount of serosanguineous drainage noted. Wound #3 status is Healed - Epithelialized. Original cause of wound was Gradually Appeared. The date acquired was: 11/06/2022. The wound has been in treatment 2 weeks. The wound is located on the Left,Medial Lower Leg. The wound measures 0cm length x 0cm width x 0cm depth; 0cm^2 area and 0cm^3 volume. There is a medium amount of serosanguineous drainage noted. Wound #4 status is Healed - Epithelialized. Original cause of wound was Gradually Appeared. The date acquired was: 11/06/2022. The wound has been in treatment 2 weeks. The wound is located on the Left,Anterior Lower Leg. The wound measures 0cm length x 0cm width x 0cm depth; 0cm^2 area and 0cm^3 volume. There is a medium amount of serosanguineous drainage noted. Assessment BERTRUM, NAKANO ([id number]) [id number] Page 5 of 7 Active Problems ICD-10 Chronic venous hypertension (idiopathic) with ulcer of bilateral lower extremity Non-pressure chronic ulcer of other part of right lower leg limited to breakdown of skin Non-pressure chronic ulcer of other part of left lower leg limited to breakdown of skin Type 2 diabetes mellitus with other skin ulcer Patient has done well with Xeroform under compression therapy. His wounds of healed. I recommended  wearing compression stockings daily. He may follow- up as needed. Plan Discharge From [facility] Services: Discharge from Wound Care Center - Congrats on healing!!!! Bathing/ Shower/ Hygiene: May shower with protection but do not get wound dressing(s) wet. Protect dressing(s) with water repellant cover (for example, large plastic bag) or a cast cover and may then take shower. - may purchase cast protector from [facility], [facility], or [employer] Edema Control - Lymphedema / SCD / Other: Elevate legs to the level of the heart or above for 30 minutes daily and/or when sitting for 3-4 times a day throughout the day. Avoid standing for long periods of time. Exercise regularly Moisturize legs daily. Non Wound Condition: Apply the following to affected area as directed: - Ketoconazole to ankles and wrap with Kerlix (in office). Lamisil as needed at home after. 1. Daily compression stockings 2. Follow-up as needed 3. Discharge from  clinic due to closed wound Electronic Signature(s) Signed: 11/28/2022 4:37:08 PM By: Geralyn Corwin DO Entered By: Geralyn Corwin on 11/28/2022 14:43:56 -------------------------------------------------------------------------------- HxROS Details Patient Name: Date of Service: Donald Jones, Donald Donald H. 11/28/2022 2:00 PM Medical Record Number: [id number] Patient Account Number: [id number] Date of Birth/Sex: Treating RN: [birth year] (77 y.o. M) Primary Care Provider: [name] Other Clinician: Referring Provider: Treating Provider/Extender: Susette Racer Weeks in Treatment: 2 Ear/Nose/Mouth/Throat Medical History: Positive for: Chronic sinus problems/congestion Respiratory Medical History: Positive for: Asthma; Sleep Apnea Cardiovascular Medical History: Positive for: Hypertension Past Medical History Notes: high cholesterol, Gastrointestinal Medical History: Past Medical History NotesKYMANI, HARDT ([id number]) [id number] Page  6 of 7 GERD Endocrine Medical History: Positive for: Type II Diabetes Past Medical History Notes: subclinical hyperthyroidism Time with diabetes: 5-61yrs Blood sugar tested every day: Yes Tested : Genitourinary Medical History: Past Medical History Notes: hx kidney stones Integumentary (Skin) Medical History: Past Medical History Notes: chronic dermatitis Musculoskeletal Medical History: Positive for: Osteoarthritis Past Medical History Notes: degenerative joint right foot drop Psychiatric Medical History: Positive for: Confinement Anxiety - general anxiety HBO Extended History Items Ear/Nose/Mouth/Throat: Chronic sinus problems/congestion Immunizations Pneumococcal Vaccine: Received Pneumococcal Vaccination: Yes Received Pneumococcal Vaccination On or After 60th Birthday: Yes Implantable Devices None Family and Social History Cancer: Yes - Siblings; Diabetes: Yes - Mother,Siblings; Heart Disease: Yes - Mother,Siblings; Hypertension: Yes - Father,Mother,Siblings; Kidney Disease: Yes - Siblings; Stroke: Yes - Father; Never smoker; Marital Status - Married; Alcohol Use: Never; Drug Use: No History; Caffeine Use: Daily; Financial Concerns: No; Food, Clothing or Shelter Needs: No; Support System Lacking: No; Transportation Concerns: No Electronic Signature(s) Signed: 11/28/2022 4:37:08 PM By: Geralyn Corwin DO Entered By: Geralyn Corwin on 11/28/2022 14:42:34 -------------------------------------------------------------------------------- SuperBill Details Patient Name: Date of Service: Donald Jones, Donald Donald H. 11/28/2022 Medical Record Number: [id number] Patient Account Number: [id number] Date of Birth/Sex: Treating RN: 17-Apr-1946 (77 y.o. Yates Decamp Primary Care Provider: [name] Other Clinician: Referring Provider: Treating Provider/Extender: Darden Palmer in Treatment: 2 ACER, BALTZ ([id number]) [id number]  Page 7 of 7 Diagnosis Coding ICD-10 Codes Code Description I87.313 Chronic venous hypertension (idiopathic) with ulcer of bilateral lower extremity L97.811 Non-pressure chronic ulcer of other part of right lower leg limited to breakdown of skin L97.821 Non-pressure chronic ulcer of other part of left lower leg limited to breakdown of skin E11.622 Type 2 diabetes mellitus with other skin ulcer Facility Procedures : CPT4 Code: [id number] Description: 99214 - WOUND CARE VISIT-LEV 4 EST PT Modifier: Quantity: 1 Physician Procedures : CPT4 Code Description Modifier [id number] - WC PHYS LEVEL 3 - EST PT ICD-10 Diagnosis Description I87.313 Chronic venous hypertension (idiopathic) with ulcer of bilateral lower extremity L97.811 Non-pressure chronic ulcer of other part of right  lower leg limited to breakdown of skin L97.821 Non-pressure chronic ulcer of other part of left lower leg limited to breakdown of skin E11.622 Type 2 diabetes mellitus with other skin ulcer Quantity: 1 Electronic Signature(s) Signed: 11/28/2022 4:37:08 PM By: Geralyn Corwin DO Entered By: Geralyn Corwin on 11/28/2022 14:44:09

## 2022-11-28 NOTE — Progress Notes (Addendum)
Donald Jones, Donald Jones ([id number]) [id number] Page 1 of 11 Visit Report for 11/28/2022 Arrival Information Details Patient Name: Date of Service: Donald RA [place], [name] Donald H. 11/28/2022 2:00 PM Medical Record Number: [id number] Patient Account Number: [id number] Date of Birth/Sex: Treating RN: 12-17-1945 (77 y.o. M) Primary Care Calina Patrie: [name] Other Clinician: Referring Harlei Lehrmann: Treating Karon Cotterill/Extender: Susette Racer Weeks in Treatment: 2 Visit Information History Since Last Visit All ordered tests and consults were completed: No Patient Arrived: Ambulatory Added or deleted any medications: No Arrival Time: 13:49 Any new allergies or adverse reactions: No Accompanied By: self Had a fall or experienced change in No Transfer Assistance: None activities of daily living that may affect Patient Identification Verified: Yes risk of falls: Secondary Verification Process Completed: Yes Signs or symptoms of abuse/neglect since last visito No Patient Has Alerts: Yes Hospitalized since last visit: No Patient Alerts: Patient on Blood Thinner Implantable device outside of the clinic excluding No cellular tissue based products placed in the center since last visit: Pain Present Now: No Electronic Signature(s) Signed: 11/28/2022 1:59:37 PM By: Dayton Scrape Entered By: Dayton Scrape on 11/28/2022 13:50:02 -------------------------------------------------------------------------------- Clinic Level of Care Assessment Details Patient Name: Date of Service: Donald Jones, [place]. 11/28/2022 2:00 PM Medical Record Number: [id number] Patient Account Number: [id number] Date of Birth/Sex: Treating RN: 03-23-1946 (77 y.o. Donald Jones Primary Care Airiana Elman: [name] Other Clinician: Referring Kristie Bracewell: Treating Semaje Kinker/Extender: Susette Racer Weeks in Treatment: 2 Clinic Level of Care Assessment Items TOOL 4 Quantity Score X- 1 0 Use when only an  EandM is performed on FOLLOW-UP visit ASSESSMENTS - Nursing Assessment / Reassessment X- 1 10 Reassessment of Co-morbidities (includes updates in patient status) X- 1 5 Reassessment of Adherence to Treatment Plan ASSESSMENTS - Wound and Skin A ssessment / Reassessment X - Simple Wound Assessment / Reassessment - one wound 1 5 X- 1 5 Complex Wound Assessment / Reassessment - multiple wounds X- 1 10 Dermatologic / Skin Assessment (not related to wound area) ASSESSMENTS - Focused Assessment X- 1 5 Circumferential Edema Measurements - multi extremities []  - 0 Nutritional Assessment / Counseling / Intervention Donald Jones ([id number]) [id number] Page 2 of 11 []  - 0 Lower Extremity Assessment (monofilament, tuning fork, pulses) []  - 0 Peripheral Arterial Disease Assessment (using hand held doppler) ASSESSMENTS - Ostomy and/or Continence Assessment and Care []  - 0 Incontinence Assessment and Management []  - 0 Ostomy Care Assessment and Management (repouching, etc.) PROCESS - Coordination of Care X - Simple Patient / Family Education for ongoing care 1 15 []  - 0 Complex (extensive) Patient / Family Education for ongoing care X- 1 10 Staff obtains Chiropractor, Records, T Results / Process Orders est []  - 0 Staff telephones HHA, Nursing Homes / Clarify orders / etc []  - 0 Routine Transfer to another Facility (non-emergent condition) []  - 0 Routine Hospital Admission (non-emergent condition) []  - 0 New Admissions / Manufacturing engineer / Ordering NPWT Apligraf, etc. , []  - 0 Emergency Hospital Admission (emergent condition) X- 1 10 Simple Discharge Coordination []  - 0 Complex (extensive) Discharge Coordination PROCESS - Special Needs []  - 0 Pediatric / Minor Patient Management []  - 0 Isolation Patient Management []  - 0 Hearing / Language / Visual special needs []  - 0 Assessment of Community assistance (transportation, D/C planning,  etc.) []  - 0 Additional assistance / Altered mentation []  - 0 Support Surface(s) Assessment (bed, cushion, seat, etc.) INTERVENTIONS - Wound Cleansing / Measurement []  - 0  Simple Wound Cleansing - one wound X- 4 5 Complex Wound Cleansing - multiple wounds X- 1 5 Wound Imaging (photographs - any number of wounds) []  - 0 Wound Tracing (instead of photographs) []  - 0 Simple Wound Measurement - one wound X- 4 5 Complex Wound Measurement - multiple wounds INTERVENTIONS - Wound Dressings []  - 0 Small Wound Dressing one or multiple wounds []  - 0 Medium Wound Dressing one or multiple wounds []  - 0 Large Wound Dressing one or multiple wounds []  - 0 Application of Medications - topical []  - 0 Application of Medications - injection INTERVENTIONS - Miscellaneous []  - 0 External ear exam []  - 0 Specimen Collection (cultures, biopsies, blood, body fluids, etc.) []  - 0 Specimen(s) / Culture(s) sent or taken to Lab for analysis []  - 0 Patient Transfer (multiple staff / Nurse, adult / Similar devices) []  - 0 Simple Staple / Suture removal (25 or less) []  - 0 Complex Staple / Suture removal (26 or more) []  - 0 Hypo / Hyperglycemic Management (close monitor of Blood Glucose) Donald Jones, Donald Jones ([id number]) [id number] Page 3 of 11 []  - 0 Ankle / Brachial Index (ABI) - do not check if billed separately X- 1 5 Vital Signs Has the patient been seen at the hospital within the last three years: Yes Total Score: 125 Level Of Care: New/Established - Level 4 Electronic Signature(s) Signed: 12/12/2022 2:03:55 PM By: Brenton Grills Entered By: Brenton Grills on 11/28/2022 14:16:47 -------------------------------------------------------------------------------- Encounter Discharge Information Details Patient Name: Date of Service: Donald Jones, Donald Donald H. 11/28/2022 2:00 PM Medical Record Number: [id number] Patient Account Number: [id number] Date of Birth/Sex: Treating  RN: [birth year] (77 y.o. Donald Jones Primary Care Mailey Landstrom: [name] Other Clinician: Referring Clemmie Marxen: Treating Aleeta Schmaltz/Extender: Darden Palmer in Treatment: 2 Encounter Discharge Information Items Discharge Condition: Stable Ambulatory Status: Ambulatory Discharge Destination: Home Transportation: Private Auto Accompanied By: self Schedule Follow-up Appointment: Yes Clinical Summary of Care: Patient Declined Electronic Signature(s) Signed: 12/12/2022 2:03:55 PM By: Brenton Grills Entered By: Brenton Grills on 11/28/2022 14:34:18 -------------------------------------------------------------------------------- Lower Extremity Assessment Details Patient Name: Date of Service: Donald Jones, Donald Donald H. 11/28/2022 2:00 PM Medical Record Number: [id number] Patient Account Number: [id number] Date of Birth/Sex: Treating RN: 10/25/1945 (77 y.o. Donald Jones Primary Care Ithan Touhey: [name] Other Clinician: Referring Marcelia Petersen: Treating Cherelle Midkiff/Extender: Susette Racer Weeks in Treatment: 2 Edema Assessment Assessed: [Left: No] [Right: No] Edema: [Left: Yes] [Right: Yes] Calf Left: Right: Point of Measurement: 32 cm From Medial Instep 36.4 cm 35.5 cm Ankle Left: Right: Point of Measurement: 14 cm From Medial Instep 22 cm 20.5 cm Vascular Assessment CZAR, TANGO ([id number]) [Right:[id number] Page 4 of 11] Pulses: Dorsalis Pedis Palpable: [Left:Yes] [Right:Yes] Extremity colors, hair growth, and conditions: Extremity Color: [Left:Red] [Right:Red] Hair Growth on Extremity: [Left:No] [Right:No] Temperature of Extremity: [Left:Warm] [Right:Warm] Capillary Refill: [Left:< 3 seconds] [Right:< 3 seconds] Dependent Rubor: [Left:No No] [Right:No No] Electronic Signature(s) Signed: 12/12/2022 2:03:55 PM By: Brenton Grills Entered By: Brenton Grills on 11/28/2022  14:01:05 -------------------------------------------------------------------------------- Multi Wound Chart Details Patient Name: Date of Service: Donald Jones, Donald Donald H. 11/28/2022 2:00 PM Medical Record Number: [id number] Patient Account Number: [id number] Date of Birth/Sex: Treating RN: August 08, 1945 (77 y.o. M) Primary Care Niccolas Loeper: [name] Other Clinician: Referring Royalti Schauf: Treating Keanon Bevins/Extender: Susette Racer Weeks in Treatment: 2 Vital Signs Height(in): 65 Capillary Blood Glucose(mg/dl): 063 Weight(lbs): 016 Pulse(bpm): 65 Body Mass Index(BMI): 39.9 Blood Pressure(mmHg): 144/86 Temperature(F): 97.8 Respiratory Rate(breaths/min):  18 [1:Photos: No Photos Right, Posterior Lower Leg Wound Location: Gradually Appeared Wounding Event: Venous Leg Ulcer Primary Etiology: N/A Secondary Etiology: 11/06/2022 Date Acquired: 2 Weeks of Treatment: Healed - Epithelialized Wound Status: No Wound Recurrence:  0x0x0 Measurements L x W x D (cm) 0 A (cm) : rea 0 Volume (cm) : 100.00% % Reduction in Area: 100.00% % Reduction in Volume: Full Thickness Without Exposed Classification: Support Structures Medium Exudate Amount: Serosanguineous Exudate Type: red, brown  Exudate Color:] [2:No Photos Right, Anterior Lower Leg Gradually Appeared Venous Leg Ulcer Diabetic Wound/Ulcer of the Lower Extremity 11/06/2022 2 Healed - Epithelialized No 0x0x0 0 0 100.00% 100.00% Full Thickness Without Exposed Support Structures  Medium Serosanguineous red, brown] [3:No Photos Left, Medial Lower Leg Gradually Appeared Venous Leg Ulcer Diabetic Wound/Ulcer of the Lower Extremity 11/06/2022 2 Healed - Epithelialized No 0x0x0 0 0 100.00% 100.00% Full Thickness Without Exposed Support  Structures Medium Serosanguineous red, brown] Wound Number: 4 N/A N/A Photos: No Photos N/A N/A Left, Anterior Lower Leg N/A N/A Wound Location: Gradually Appeared N/A N/A Wounding Event: Venous Leg Ulcer N/A N/A Primary  Etiology: Diabetic Wound/Ulcer of the Lower N/A N/A Secondary Etiology: Extremity 11/06/2022 N/A N/A Date Acquired: 2 N/A N/A Weeks of Treatment: Healed - Epithelialized N/A N/A Wound Status: No N/A N/A Wound Recurrence: 0x0x0 N/A N/A Measurements L x W x D (cm) 0 N/A N/A A (cm) : rea 0 N/A N/A Volume (cm) : 100.00% N/A N/A % Reduction in Area: 100.00% N/A N/A % Reduction in VolumeANCIL, Donald Jones ([id number]) [id number] Page 5 of 11 Full Thickness Without Exposed N/A N/A Classification: Support Structures Medium N/A N/A Exudate Amount: Serosanguineous N/A N/A Exudate Type: red, brown N/A N/A Exudate Color: Treatment Notes Wound #1 (Lower Leg) Wound Laterality: Right, Posterior Cleanser Peri-Wound Care Topical Primary Dressing Secondary Dressing Secured With Compression Wrap Compression Stockings Add-Ons Wound #2 (Lower Leg) Wound Laterality: Right, Anterior Cleanser Peri-Wound Care Topical Primary Dressing Secondary Dressing Secured With Compression Wrap Compression Stockings Add-Ons Wound #3 (Lower Leg) Wound Laterality: Left, Medial Cleanser Peri-Wound Care Topical Primary Dressing Secondary Dressing Secured With Compression Wrap Compression Stockings Add-Ons Wound #4 (Lower Leg) Wound Laterality: Left, Anterior Cleanser Peri-Wound Care Topical Primary Dressing Secondary Dressing Secured With Compression Wrap Compression Stockings Donald Jones, Donald Jones ([id number]) [id number] Page 6 of 11 Add-Ons Electronic Signature(s) Signed: 11/28/2022 4:37:08 PM By: Geralyn Corwin DO Entered By: Geralyn Corwin on 11/28/2022 14:41:33 -------------------------------------------------------------------------------- Multi-Disciplinary Care Plan Details Patient Name: Date of Service: Donald Jones, Donald Donald H. 11/28/2022 2:00 PM Medical Record Number: [id number] Patient Account Number: [id number] Date of  Birth/Sex: Treating RN: 07/22/1945 (77 y.o. Donald Jones Primary Care Merve Hotard: [name] Other Clinician: Referring Ramsey Midgett: Treating Charleston Hankin/Extender: Susette Racer Weeks in Treatment: 2 Active Inactive Electronic Signature(s) Signed: 12/12/2022 2:03:55 PM By: Brenton Grills Entered By: Brenton Grills on 11/28/2022 14:35:46 -------------------------------------------------------------------------------- Pain Assessment Details Patient Name: Date of Service: Donald Jones, Donald Donald H. 11/28/2022 2:00 PM Medical Record Number: [id number] Patient Account Number: [id number] Date of Birth/Sex: Treating RN: 09-Oct-1945 (77 y.o. M) Primary Care Sheana Bir: [name] Other Clinician: Referring Allana Shrestha: Treating Estha Few/Extender: Susette Racer Weeks in Treatment: 2 Active Problems Location of Pain Severity and Description of Pain Patient Has Paino No Site Locations Pain Management and Medication Current Pain Management: Donald Jones, Donald Jones ([id number]) [id number] Page 7 of 11 Electronic Signature(s) Signed: 11/28/2022 1:59:37 PM By: Dayton Scrape Entered By: Dayton Scrape on 11/28/2022 13:50:49 -------------------------------------------------------------------------------- Patient/Caregiver Education Details Patient  Name: Date of Service: [name], [place] 8/13/2024andnbsp2:00 PM Medical Record Number: [id number] Patient Account Number: [id number] Date of Birth/Gender: Treating RN: Mar 05, 1946 (77 y.o. Donald Jones Primary Care Physician: [name] Other Clinician: Referring Physician: Treating Physician/Extender: Darden Palmer in Treatment: 2 Education Assessment Education Provided To: Patient Education Topics Provided Wound/Skin Impairment: Methods: Explain/Verbal Responses: State content correctly Electronic Signature(s) Signed: 12/12/2022 2:03:55 PM By: Brenton Grills Entered By: Brenton Grills on  11/28/2022 14:02:14 -------------------------------------------------------------------------------- Wound Assessment Details Patient Name: Date of Service: Donald Jones, Donald Donald H. 11/28/2022 2:00 PM Medical Record Number: [id number] Patient Account Number: [id number] Date of Birth/Sex: Treating RN: [birth year] (77 y.o. Donald Jones Primary Care Lalena Salas: [name] Other Clinician: Referring Azusena Erlandson: Treating Trevar Boehringer/Extender: Susette Racer Weeks in Treatment: 2 Wound Status Wound Number: 1 Primary Etiology: Venous Leg Ulcer Wound Location: Right, Posterior Lower Leg Wound Status: Healed - Epithelialized Wounding Event: Gradually Appeared Date Acquired: 11/06/2022 Weeks Of Treatment: 2 Clustered Wound: No Wound Measurements Length: (cm) Width: (cm) Depth: (cm) Area: (cm) Volume: (cm) 0 % Reduction in Area: 100% 0 % Reduction in Volume: 100% 0 0 0 Wound Description Donald Jones, Donald Jones ([id number]) Classification: Full Thickness Without Exposed Support Structures Exudate Amount: Medium Exudate Type: Serosanguineous Exudate Color: red, brown [id number] Page 8 of 11 Periwound Skin Texture Texture Color No Abnormalities Noted: No No Abnormalities Noted: No Moisture No Abnormalities Noted: No Treatment Notes Wound #1 (Lower Leg) Wound Laterality: Right, Posterior Cleanser Peri-Wound Care Topical Primary Dressing Secondary Dressing Secured With Compression Wrap Compression Stockings Add-Ons Electronic Signature(s) Signed: 12/12/2022 2:03:55 PM By: Brenton Grills Previous Signature: 11/28/2022 1:59:37 PM Version By: Dayton Scrape Entered By: Brenton Grills on 11/28/2022 14:14:15 -------------------------------------------------------------------------------- Wound Assessment Details Patient Name: Date of Service: Donald Jones, Donald Donald H. 11/28/2022 2:00 PM Medical Record Number: [id number] Patient Account Number: [id number] Date of  Birth/Sex: Treating RN: Jun 24, 1945 (77 y.o. Donald Jones Primary Care Aubriana Ravelo: [name] Other Clinician: Referring Takeia Ciaravino: Treating Hennie Gosa/Extender: Susette Racer Weeks in Treatment: 2 Wound Status Wound Number: 2 Primary Etiology: Venous Leg Ulcer Wound Location: Right, Anterior Lower Leg Secondary Etiology: Diabetic Wound/Ulcer of the Lower Extremity Wounding Event: Gradually Appeared Wound Status: Healed - Epithelialized Date Acquired: 11/06/2022 Weeks Of Treatment: 2 Clustered Wound: No Wound Measurements Length: (cm) Width: (cm) Depth: (cm) Area: (cm) Volume: (cm) 0 % Reduction in Area: 100% 0 % Reduction in Volume: 100% 0 0 0 Wound Description Classification: Full Thickness Without Exposed Suppor Exudate Amount: Medium Exudate Type: Serosanguineous Exudate Color: red, brown t Structures [id number] Donald Jones, Donald Jones ([id number]) [id number] Page 9 of 11 Texture Color No Abnormalities Noted: No No Abnormalities Noted: No Moisture No Abnormalities Noted: No Treatment Notes Wound #2 (Lower Leg) Wound Laterality: Right, Anterior Cleanser Peri-Wound Care Topical Primary Dressing Secondary Dressing Secured With Compression Wrap Compression Stockings Add-Ons Electronic Signature(s) Signed: 12/12/2022 2:03:55 PM By: Brenton Grills Previous Signature: 11/28/2022 1:59:37 PM Version By: Dayton Scrape Entered By: Brenton Grills on 11/28/2022 14:14:15 -------------------------------------------------------------------------------- Wound Assessment Details Patient Name: Date of Service: Donald Jones, Donald Donald H. 11/28/2022 2:00 PM Medical Record Number: [id number] Patient Account Number: [id number] Date of Birth/Sex: Treating RN: 08/30/1945 (77 y.o. Donald Jones Primary Care Kenard Morawski: [name] Other Clinician: Referring Randol Zumstein: Treating Rozell Theiler/Extender: Susette Racer Weeks in Treatment:  2 Wound Status Wound Number: 3 Primary Etiology: Venous Leg Ulcer Wound Location: Left, Medial Lower Leg Secondary Etiology: Diabetic Wound/Ulcer of  the Lower Extremity Wounding Event: Gradually Appeared Wound Status: Healed - Epithelialized Date Acquired: 11/06/2022 Weeks Of Treatment: 2 Clustered Wound: No Wound Measurements Length: (cm) Width: (cm) Depth: (cm) Area: (cm) Volume: (cm) 0 % Reduction in Area: 100% 0 % Reduction in Volume: 100% 0 0 0 Wound Description Classification: Full Thickness Without Exposed Suppor Exudate Amount: Medium Exudate Type: Serosanguineous Exudate Color: red, brown t Structures Periwound Skin Texture Texture Color No Abnormalities Noted: No No Abnormalities Noted: No Moisture No Abnormalities Noted: No Donald Jones, Donald Jones ([id number]) [id number] Page 10 of 11 Treatment Notes Wound #3 (Lower Leg) Wound Laterality: Left, Medial Cleanser Peri-Wound Care Topical Primary Dressing Secondary Dressing Secured With Compression Wrap Compression Stockings Add-Ons Electronic Signature(s) Signed: 12/12/2022 2:03:55 PM By: Brenton Grills Previous Signature: 11/28/2022 1:59:37 PM Version By: Dayton Scrape Entered By: Brenton Grills on 11/28/2022 14:14:16 -------------------------------------------------------------------------------- Wound Assessment Details Patient Name: Date of Service: Donald Jones, Donald Donald H. 11/28/2022 2:00 PM Medical Record Number: [id number] Patient Account Number: [id number] Date of Birth/Sex: Treating RN: 1945-08-15 (77 y.o. Donald Jones Primary Care Shakela Donati: [name] Other Clinician: Referring Camani Sesay: Treating Wes Lezotte/Extender: Susette Racer Weeks in Treatment: 2 Wound Status Wound Number: 4 Primary Etiology: Venous Leg Ulcer Wound Location: Left, Anterior Lower Leg Secondary Etiology: Diabetic Wound/Ulcer of the Lower Extremity Wounding Event: Gradually Appeared Wound  Status: Healed - Epithelialized Date Acquired: 11/06/2022 Weeks Of Treatment: 2 Clustered Wound: No Wound Measurements Length: (cm) Width: (cm) Depth: (cm) Area: (cm) Volume: (cm) 0 % Reduction in Area: 100% 0 % Reduction in Volume: 100% 0 0 0 Wound Description Classification: Full Thickness Without Exposed Suppor Exudate Amount: Medium Exudate Type: Serosanguineous Exudate Color: red, brown t Structures Periwound Skin Texture Texture Color No Abnormalities Noted: No No Abnormalities Noted: No Moisture No Abnormalities Noted: No Treatment Notes Wound #4 (Lower Leg) Wound Laterality: Left, Anterior Cleanser Donald Jones, Donald Jones ([id number]) [id number] Page 11 of 11 Peri-Wound Care Topical Primary Dressing Secondary Dressing Secured With Compression Wrap Compression Stockings Add-Ons Electronic Signature(s) Signed: 12/12/2022 2:03:55 PM By: Brenton Grills Previous Signature: 11/28/2022 1:59:37 PM Version By: Dayton Scrape Entered By: Brenton Grills on 11/28/2022 14:14:16 -------------------------------------------------------------------------------- Vitals Details Patient Name: Date of Service: Donald Jones, Donald Donald H. 11/28/2022 2:00 PM Medical Record Number: [id number] Patient Account Number: [id number] Date of Birth/Sex: Treating RN: [birth year] (77 y.o. M) Primary Care Ayaka Andes: [name] Other Clinician: Referring Obaloluwa Delatte: Treating Lucrecia Mcphearson/Extender: Susette Racer Weeks in Treatment: 2 Vital Signs Time Taken: 01:50 Temperature (F): 97.8 Height (in): 65 Pulse (bpm): 65 Weight (lbs): 240 Respiratory Rate (breaths/min): 18 Body Mass Index (BMI): 39.9 Blood Pressure (mmHg): 144/86 Capillary Blood Glucose (mg/dl): 710 Reference Range: 80 - 120 mg / dl Electronic Signature(s) Signed: 11/28/2022 1:59:37 PM By: Dayton Scrape Entered By: Dayton Scrape on 11/28/2022 13:50:41

## 2022-11-30 ENCOUNTER — Encounter (INDEPENDENT_AMBULATORY_CARE_PROVIDER_SITE_OTHER): Payer: Self-pay

## 2022-12-04 ENCOUNTER — Encounter (HOSPITAL_BASED_OUTPATIENT_CLINIC_OR_DEPARTMENT_OTHER): Payer: Medicare Other | Admitting: Internal Medicine

## 2022-12-12 NOTE — Progress Notes (Signed)
KYRIS, MARSCHKE ([id number]) [id number] Page 1 of 12 Visit Report for 11/21/2022 Arrival Information Details Patient Name: Date of Service: [name], [place] 11/21/2022 3:00 PM Medical Record Number: [id number] Patient Account Number: [id number] Date of Birth/Sex: Treating RN: 1946-01-07 (77 y.o. Yates Decamp Primary Care Jihad Brownlow: [name] Other Clinician: Referring Anguel Delapena: Treating Fallynn Gravett/Extender: Darden Palmer in Treatment: 1 Visit Information History Since Last Visit All ordered tests and consults were completed: Yes Patient Arrived: Ambulatory Added or deleted any medications: No Arrival Time: 14:57 Any new allergies or adverse reactions: No Accompanied By: self Had a fall or experienced change in No Transfer Assistance: None activities of daily living that may affect Patient Identification Verified: Yes risk of falls: Secondary Verification Process Completed: Yes Signs or symptoms of abuse/neglect since last visito No Patient Has Alerts: Yes Hospitalized since last visit: No Patient Alerts: Patient on Blood Thinner Implantable device outside of the clinic excluding No cellular tissue based products placed in the center since last visit: Has Dressing in Place as Prescribed: Yes Pain Present Now: No Electronic Signature(s) Signed: 12/12/2022 2:03:55 PM By: Brenton Grills Entered By: Brenton Grills on 11/21/2022 14:57:43 -------------------------------------------------------------------------------- Compression Therapy Details Patient Name: Date of Service: [name], [name] 11/21/2022 3:00 PM Medical Record Number: [id number] Patient Account Number: [id number] Date of Birth/Sex: Treating RN: [birth year] (77 y.o. Yates Decamp Primary Care Raveena Hebdon: [name] Other Clinician: Referring Raynold Blankenbaker: Treating Georgia Baria/Extender: Susette Racer Weeks in Treatment: 1 Compression Therapy Performed for  Wound Assessment: Wound #1 Right,Posterior Lower Leg Performed By: Clinician Brenton Grills, RN Compression Type: Three Layer Post Procedure Diagnosis Same as Pre-procedure Electronic Signature(s) Signed: 12/12/2022 2:03:55 PM By: Brenton Grills Entered By: Brenton Grills on 11/21/2022 15:51:23 [name] ([id number]) [id number].pdf Page 2 of 12 -------------------------------------------------------------------------------- Compression Therapy Details Patient Name: Date of Service: [name], [name] 11/21/2022 3:00 PM Medical Record Number: [id number] Patient Account Number: [id number] Date of Birth/Sex: Treating RN: 10-05-1945 (77 y.o. Yates Decamp Primary Care Alvie Speltz: [name] Other Clinician: Referring Kassadi Presswood: Treating Sia Gabrielsen/Extender: Susette Racer Weeks in Treatment: 1 Compression Therapy Performed for Wound Assessment: Wound #2 Right,Anterior Lower Leg Performed By: Clinician Brenton Grills, RN Compression Type: Three Layer Post Procedure Diagnosis Same as Pre-procedure Electronic Signature(s) Signed: 12/12/2022 2:03:55 PM By: Brenton Grills Entered By: Brenton Grills on 11/21/2022 15:51:23 -------------------------------------------------------------------------------- Compression Therapy Details Patient Name: Date of Service: [name], [name] 11/21/2022 3:00 PM Medical Record Number: [id number] Patient Account Number: [id number] Date of Birth/Sex: Treating RN: 12/28/1945 (77 y.o. Yates Decamp Primary Care Keylan Costabile: [name] Other Clinician: Referring Cielle Aguila: Treating Leo Weyandt/Extender: Susette Racer Weeks in Treatment: 1 Compression Therapy Performed for Wound Assessment: Wound #3 Left,Medial Lower Leg Performed By: Clinician Brenton Grills, RN Compression Type: Three Layer Post Procedure Diagnosis Same as Pre-procedure Electronic Signature(s) Signed: 12/12/2022 2:03:55 PM By: Brenton Grills Entered By: Brenton Grills on 11/21/2022 15:51:23 -------------------------------------------------------------------------------- Compression Therapy Details Patient Name: Date of Service: [name], [name] 11/21/2022 3:00 PM Medical Record Number: [id number] Patient Account Number: [id number] Date of Birth/Sex: Treating RN: August 13, 1945 (77 y.o. Yates Decamp Primary Care Jolee Critcher: [name] Other Clinician: Referring Fujiko Picazo: Treating Soraiya Ahner/Extender: Susette Racer Weeks in Treatment: 1 Compression Therapy Performed for Wound Assessment: Wound #4 Left,Anterior Lower Leg Performed By: Clinician Brenton Grills, RN Compression Type: Three Layer Post Procedure Diagnosis Same as Pre-procedure Electronic Signature(s) Signed: 12/12/2022 2:03:55  PM By: Sunnie Nielsen, Al Decant ([id number]) PM By: Brenton Grills [phone].pdf Page 3 of 12 Signed: 12/12/2022 2:03:55 Entered By: Brenton Grills on 11/21/2022 15:51:23 -------------------------------------------------------------------------------- Encounter Discharge Information Details Patient Name: Date of Service: [name], [place] 11/21/2022 3:00 PM Medical Record Number: [id number] Patient Account Number: [id number] Date of Birth/Sex: Treating RN: 03-22-1946 (77 y.o. Yates Decamp Primary Care Jeffory Snelgrove: [name] Other Clinician: Referring Marlowe Cinquemani: Treating Ytzel Gubler/Extender: Darden Palmer in Treatment: 1 Encounter Discharge Information Items Discharge Condition: Stable Ambulatory Status: Ambulatory Discharge Destination: Home Transportation: Private Auto Accompanied By: self Schedule Follow-up Appointment: Yes Clinical Summary of Care: Patient Declined Electronic Signature(s) Signed: 12/12/2022 2:03:55 PM By: Brenton Grills Entered By: Brenton Grills on 11/21/2022  15:56:57 -------------------------------------------------------------------------------- Lower Extremity Assessment Details Patient Name: Date of Service: [name], [name] 11/21/2022 3:00 PM Medical Record Number: [id number] Patient Account Number: [id number] Date of Birth/Sex: Treating RN: Jun 13, 1945 (77 y.o. Yates Decamp Primary Care Kwana Ringel: [name] Other Clinician: Referring Andrian Urbach: Treating Abrielle Finck/Extender: Susette Racer Weeks in Treatment: 1 Edema Assessment Assessed: [Left: No] [Right: No] Edema: [Left: Yes] [Right: Yes] Calf Left: Right: Point of Measurement: 32 cm From Medial Instep 36.4 cm 35.5 cm Ankle Left: Right: Point of Measurement: 14 cm From Medial Instep 22 cm 20.5 cm Vascular Assessment Pulses: Dorsalis Pedis Palpable: [Left:Yes] [Right:Yes] Extremity colors, hair growth, and conditions: Extremity Color: [Left:Red] [Right:Red] Hair Growth on Extremity: [Left:No] [Right:No] Temperature of Extremity: [Left:Warm] [Right:Warm] Capillary Refill: [Left:< 3 seconds] [Right:< 3 seconds] Dependent Rubor: [Left:No] [Right:No] PLEDGER, DEBENEDETTI ([id number]) [Left:No] [Right:[id number] Page 4 of 12 No] Toe Nail Assessment Left: Right: Thick: No No Discolored: No No Deformed: No No Improper Length and Hygiene: No No Electronic Signature(s) Signed: 12/12/2022 2:03:55 PM By: Brenton Grills Entered By: Brenton Grills on 11/21/2022 14:58:58 -------------------------------------------------------------------------------- Multi Wound Chart Details Patient Name: Date of Service: [name], [name] 11/21/2022 3:00 PM Medical Record Number: [id number] Patient Account Number: [id number] Date of Birth/Sex: Treating RN: 1945-05-27 (77 y.o. M) Primary Care Jeannine Pennisi: [name] Other Clinician: Referring Zahira Brummond: Treating Bricen Victory/Extender: Susette Racer Weeks in Treatment: 1 Vital Signs Height(in): Capillary  Blood Glucose(mg/dl): 951 Weight(lbs): Pulse(bpm): 56 Body Mass Index(BMI): Blood Pressure(mmHg): 131/79 Temperature(F): 98 Respiratory Rate(breaths/min): 18 [1:Photos:] Right, Posterior Lower Leg Right, Anterior Lower Leg Left, Medial Lower Leg Wound Location: Gradually Appeared Gradually Appeared Gradually Appeared Wounding Event: Venous Leg Ulcer Venous Leg Ulcer Venous Leg Ulcer Primary Etiology: N/A Diabetic Wound/Ulcer of the Lower Diabetic Wound/Ulcer of the Lower Secondary Etiology: Extremity Extremity Chronic sinus problems/congestion, Chronic sinus problems/congestion, Chronic sinus problems/congestion, Comorbid History: Asthma, Sleep Apnea, Hypertension, Asthma, Sleep Apnea, Hypertension, Asthma, Sleep Apnea, Hypertension, Type II Diabetes, Osteoarthritis, Type II Diabetes, Osteoarthritis, Type II Diabetes, Osteoarthritis, Confinement Anxiety Confinement Anxiety Confinement Anxiety 11/06/2022 11/06/2022 11/06/2022 Date Acquired: 1 1 1  Weeks of Treatment: Open Open Open Wound Status: No No No Wound Recurrence: 0.4x0.4x0.1 1.1x0.3x0.1 0.3x0.3x0.1 Measurements L x W x D (cm) 0.126 0.259 0.071 A (cm) : rea 0.013 0.026 0.007 Volume (cm) : 35.70% 15.40% 97.00% % Reduction in Area: 35.00% 16.10% 97.00% % Reduction in Volume: Full Thickness Without Exposed Full Thickness Without Exposed Full Thickness Without Exposed Classification: Support Structures Support Structures Support Structures Medium Medium Medium Exudate A mount: Serosanguineous Serosanguineous Serosanguineous Exudate Type: red, brown red, brown red, brown Exudate Color: None Present (0%) None Present (0%) None Present (0%) Granulation Amount: Large (67-100%) Large (67-100%) Large (67-100%) Necrotic Amount: Eschar,  Adherent Slough Adherent [employer], Adherent Slough Necrotic Tissue: Fat Layer (Subcutaneous Tissue): Yes Fat Layer (Subcutaneous Tissue): Yes Fat Layer (Subcutaneous Tissue):  Yes Exposed Structures: Fascia: No Fascia: No Fascia: No Tendon: No Tendon: No Tendon: No HATCH, PITZER ([id number]) [id number] Page 5 of 12 Muscle: No Muscle: No Muscle: No Joint: No Joint: No Joint: No Bone: No Bone: No Bone: No None None None Epithelialization: Hemosiderin Staining: Yes Hemosiderin Staining: Yes Periwound Skin Color: Wound Number: 4 N/A N/A Photos: N/A N/A Left, Anterior Lower Leg N/A N/A Wound Location: Gradually Appeared N/A N/A Wounding Event: Venous Leg Ulcer N/A N/A Primary Etiology: Diabetic Wound/Ulcer of the Lower N/A N/A Secondary Etiology: Extremity Chronic sinus problems/congestion, N/A N/A Comorbid History: Asthma, Sleep Apnea, Hypertension, Type II Diabetes, Osteoarthritis, Confinement Anxiety 11/06/2022 N/A N/A Date Acquired: 1 N/A N/A Weeks of Treatment: Open N/A N/A Wound Status: No N/A N/A Wound Recurrence: 0.5x0.3x0.1 N/A N/A Measurements L x W x D (cm) 0.118 N/A N/A A (cm) : rea 0.012 N/A N/A Volume (cm) : 0.00% N/A N/A % Reduction in Area: 0.00% N/A N/A % Reduction in Volume: Full Thickness Without Exposed N/A N/A Classification: Support Structures Medium N/A N/A Exudate A mount: Serosanguineous N/A N/A Exudate Type: red, brown N/A N/A Exudate Color: None Present (0%) N/A N/A Granulation Amount: Large (67-100%) N/A N/A Necrotic Amount: Eschar, Adherent Slough N/A N/A Necrotic Tissue: Fat Layer (Subcutaneous Tissue): Yes N/A N/A Exposed Structures: Fascia: No Tendon: No Muscle: No Joint: No Bone: No None N/A N/A Epithelialization: Treatment Notes Electronic Signature(s) Signed: 11/21/2022 5:13:05 PM By: Geralyn Corwin DO Entered By: Geralyn Corwin on 11/21/2022 15:41:26 -------------------------------------------------------------------------------- Multi-Disciplinary Care Plan Details Patient Name: Date of Service: [name], [name] 11/21/2022 3:00  PM Medical Record Number: [id number] Patient Account Number: [id number] Date of Birth/Sex: Treating RN: 10-Jun-1945 (77 y.o. Yates Decamp Primary Care Deborra Phegley: [name] Other Clinician: Referring Talley Kreiser: Treating Caedmon Louque/Extender: Susette Racer Weeks in Treatment: 1 Active Inactive Venous Leg Ulcer HOYET, VEALE ([id number]) [id number] Page 6 of 12 Nursing Diagnoses: Knowledge deficit related to disease process and management Potential for venous Insuffiency (use before diagnosis confirmed) Goals: Patient will maintain optimal edema control Date Initiated: 11/14/2022 Target Resolution Date: 12/15/2022 Goal Status: Active Patient/caregiver will verbalize understanding of disease process and disease management Date Initiated: 11/14/2022 Target Resolution Date: 12/08/2022 Goal Status: Active Interventions: Assess peripheral edema status every visit. Compression as ordered Provide education on venous insufficiency Notes: Wound/Skin Impairment Nursing Diagnoses: Impaired tissue integrity Knowledge deficit related to ulceration/compromised skin integrity Goals: Patient/caregiver will verbalize understanding of skin care regimen Date Initiated: 11/14/2022 Target Resolution Date: 12/15/2022 Goal Status: Active Ulcer/skin breakdown will have a volume reduction of 30% by week 4 Date Initiated: 11/14/2022 Target Resolution Date: 12/12/2022 Goal Status: Active Interventions: Assess patient/caregiver ability to obtain necessary supplies Assess patient/caregiver ability to perform ulcer/skin care regimen upon admission and as needed Assess ulceration(s) every visit Provide education on ulcer and skin care Notes: Electronic Signature(s) Signed: 12/12/2022 2:03:55 PM By: Brenton Grills Entered By: Brenton Grills on 11/21/2022 15:19:38 -------------------------------------------------------------------------------- Pain Assessment  Details Patient Name: Date of Service: [name], [name] 11/21/2022 3:00 PM Medical Record Number: [id number] Patient Account Number: [id number] Date of Birth/Sex: Treating RN: 1946/03/01 (77 y.o. Yates Decamp Primary Care Judeth Gilles: [name] Other Clinician: Referring Threasa Kinch: Treating Syrena Burges/Extender: Susette Racer Weeks in Treatment: 1 Active Problems Location of Pain Severity and Description of Pain Patient Has Paino No Site  Locations [place] ([id number]) [id number] Page 7 of 12 Pain Management and Medication Current Pain Management: Electronic Signature(s) Signed: 12/12/2022 2:03:55 PM By: Brenton Grills Entered By: Brenton Grills on 11/21/2022 14:58:35 -------------------------------------------------------------------------------- Patient/Caregiver Education Details Patient Name: Date of Service: [name], RO BERT [name] 8/6/2024andnbsp3:00 PM Medical Record Number: [id number] Patient Account Number: [id number] Date of Birth/Gender: Treating RN: [birth year] (77 y.o. Yates Decamp Primary Care Physician: [name] Other Clinician: Referring Physician: Treating Physician/Extender: Darden Palmer in Treatment: 1 Education Assessment Education Provided To: Patient Education Topics Provided Wound/Skin Impairment: Methods: Explain/Verbal Responses: State content correctly Electronic Signature(s) Signed: 12/12/2022 2:03:55 PM By: Brenton Grills Entered By: Brenton Grills on 11/21/2022 15:19:56 -------------------------------------------------------------------------------- Wound Assessment Details Patient Name: Date of Service: [name], [name] 11/21/2022 3:00 PM Medical Record Number: [id number] Patient Account Number: [id number] Date of Birth/Sex: Treating RN: [birth year] (77 y.o. Yates Decamp Primary Care Suellen Durocher: [name] Other Clinician: Referring Hrithik Boschee: Treating George Alcantar/Extender:  Susette Racer Yaak, [place] ([id number]) [id number] Page 8 of 12 Weeks in Treatment: 1 Wound Status Wound Number: 1 Primary Venous Leg Ulcer Etiology: Wound Location: Right, Posterior Lower Leg Wound Open Wounding Event: Gradually Appeared Status: Date Acquired: 11/06/2022 Comorbid Chronic sinus problems/congestion, Asthma, Sleep Apnea, Weeks Of Treatment: 1 History: Hypertension, Type II Diabetes, Osteoarthritis, Confinement Clustered Wound: No Anxiety Photos Wound Measurements Length: (cm) 0.4 Width: (cm) 0.4 Depth: (cm) 0.1 Area: (cm) 0.126 Volume: (cm) 0.013 % Reduction in Area: 35.7% % Reduction in Volume: 35% Epithelialization: None Tunneling: No Undermining: No Wound Description Classification: Full Thickness Without Exposed Support Exudate Amount: Medium Exudate Type: Serosanguineous Exudate Color: red, brown Structures Foul Odor After Cleansing: No Slough/Fibrino Yes Wound Bed Granulation Amount: None Present (0%) Exposed Structure Necrotic Amount: Large (67-100%) Fascia Exposed: No Necrotic Quality: Eschar, Adherent Slough Fat Layer (Subcutaneous Tissue) Exposed: Yes Tendon Exposed: No Muscle Exposed: No Joint Exposed: No Bone Exposed: No Periwound Skin Texture Texture Color No Abnormalities Noted: No No Abnormalities Noted: No Hemosiderin Staining: Yes Moisture No Abnormalities Noted: No Electronic Signature(s) Signed: 12/12/2022 2:03:55 PM By: Brenton Grills Entered By: Brenton Grills on 11/21/2022 15:14:29 -------------------------------------------------------------------------------- Wound Assessment Details Patient Name: Date of Service: [name], [name] 11/21/2022 3:00 PM Medical Record Number: [id number] Patient Account Number: [id number] Date of Birth/Sex: Treating RN: 27-Dec-1945 (77 y.o. Yates Decamp Primary Care Mohogany Toppins: [name] Other Clinician: Referring Sareena Odeh: Treating  Karson Reede/Extender: Darden Palmer in Treatment: 1 [place], [place] ([id number]) [id number] Page 9 of 12 Wound Status Wound Number: 2 Primary Venous Leg Ulcer Etiology: Wound Location: Right, Anterior Lower Leg Secondary Diabetic Wound/Ulcer of the Lower Extremity Wounding Event: Gradually Appeared Etiology: Date Acquired: 11/06/2022 Wound Open Weeks Of Treatment: 1 Status: Clustered Wound: No Comorbid Chronic sinus problems/congestion, Asthma, Sleep Apnea, History: Hypertension, Type II Diabetes, Osteoarthritis, Confinement Anxiety Photos Wound Measurements Length: (cm) 1.1 Width: (cm) 0.3 Depth: (cm) 0.1 Area: (cm) 0.259 Volume: (cm) 0.026 % Reduction in Area: 15.4% % Reduction in Volume: 16.1% Epithelialization: None Tunneling: No Undermining: No Wound Description Classification: Full Thickness Without Exposed Support Exudate Amount: Medium Exudate Type: Serosanguineous Exudate Color: red, brown Structures Foul Odor After Cleansing: No Slough/Fibrino Yes Wound Bed Granulation Amount: None Present (0%) Exposed Structure Necrotic Amount: Large (67-100%) Fascia Exposed: No Necrotic Quality: Adherent Slough Fat Layer (Subcutaneous Tissue) Exposed: Yes Tendon Exposed: No Muscle Exposed: No Joint Exposed: No Bone Exposed: No Periwound Skin Texture Texture Color No Abnormalities Noted: No  No Abnormalities Noted: No Moisture No Abnormalities Noted: No Electronic Signature(s) Signed: 12/12/2022 2:03:55 PM By: Brenton Grills Entered By: Brenton Grills on 11/21/2022 15:15:11 -------------------------------------------------------------------------------- Wound Assessment Details Patient Name: Date of Service: [name], [name] 11/21/2022 3:00 PM Medical Record Number: [id number] Patient Account Number: [id number] Date of Birth/Sex: Treating RN: [birth year] (77 y.o. Yates Decamp Primary Care Lyrik Dockstader: [name] Other  Clinician: Referring Jaquanda Wickersham: Treating Gabriellah Rabel/Extender: Darden Palmer in Treatment: 1 [place], [place] ([id number]) [id number] Page 10 of 12 Wound Status Wound Number: 3 Primary Venous Leg Ulcer Etiology: Wound Location: Left, Medial Lower Leg Secondary Diabetic Wound/Ulcer of the Lower Extremity Wounding Event: Gradually Appeared Etiology: Date Acquired: 11/06/2022 Wound Open Weeks Of Treatment: 1 Status: Clustered Wound: No Comorbid Chronic sinus problems/congestion, Asthma, Sleep Apnea, History: Hypertension, Type II Diabetes, Osteoarthritis, Confinement Anxiety Photos Wound Measurements Length: (cm) 0.3 Width: (cm) 0.3 Depth: (cm) 0.1 Area: (cm) 0.071 Volume: (cm) 0.007 % Reduction in Area: 97% % Reduction in Volume: 97% Epithelialization: None Tunneling: No Undermining: No Wound Description Classification: Full Thickness Without Exposed Support Exudate Amount: Medium Exudate Type: Serosanguineous Exudate Color: red, brown Structures Foul Odor After Cleansing: No Slough/Fibrino Yes Wound Bed Granulation Amount: None Present (0%) Exposed Structure Necrotic Amount: Large (67-100%) Fascia Exposed: No Necrotic Quality: Eschar, Adherent Slough Fat Layer (Subcutaneous Tissue) Exposed: Yes Tendon Exposed: No Muscle Exposed: No Joint Exposed: No Bone Exposed: No Periwound Skin Texture Texture Color No Abnormalities Noted: No No Abnormalities Noted: No Hemosiderin Staining: Yes Moisture No Abnormalities Noted: No Electronic Signature(s) Signed: 12/12/2022 2:03:55 PM By: Brenton Grills Entered By: Brenton Grills on 11/21/2022 15:15:55 -------------------------------------------------------------------------------- Wound Assessment Details Patient Name: Date of Service: [name], [name] 11/21/2022 3:00 PM Medical Record Number: [id number] Patient Account Number: [id number] Date of Birth/Sex: Treating  RN: 21-Mar-1946 (77 y.o. Yates Decamp Primary Care Gabrille Kilbride: [name] Other Clinician: Referring Denai Caba: Treating Tyasia Packard/Extender: Darden Palmer in Treatment: 1 [place], [place] ([id number]) [id number] Page 11 of 12 Wound Status Wound Number: 4 Primary Venous Leg Ulcer Etiology: Wound Location: Left, Anterior Lower Leg Secondary Diabetic Wound/Ulcer of the Lower Extremity Wounding Event: Gradually Appeared Etiology: Date Acquired: 11/06/2022 Wound Open Weeks Of Treatment: 1 Status: Clustered Wound: No Comorbid Chronic sinus problems/congestion, Asthma, Sleep Apnea, History: Hypertension, Type II Diabetes, Osteoarthritis, Confinement Anxiety Photos Wound Measurements Length: (cm) 0.5 Width: (cm) 0.3 Depth: (cm) 0.1 Area: (cm) 0.118 Volume: (cm) 0.012 % Reduction in Area: 0% % Reduction in Volume: 0% Epithelialization: None Tunneling: No Undermining: No Wound Description Classification: Full Thickness Without Exposed Support Exudate Amount: Medium Exudate Type: Serosanguineous Exudate Color: red, brown Structures Foul Odor After Cleansing: No Slough/Fibrino Yes Wound Bed Granulation Amount: None Present (0%) Exposed Structure Necrotic Amount: Large (67-100%) Fascia Exposed: No Necrotic Quality: Eschar, Adherent Slough Fat Layer (Subcutaneous Tissue) Exposed: Yes Tendon Exposed: No Muscle Exposed: No Joint Exposed: No Bone Exposed: No Periwound Skin Texture Texture Color No Abnormalities Noted: No No Abnormalities Noted: No Moisture No Abnormalities Noted: No Electronic Signature(s) Signed: 12/12/2022 2:03:55 PM By: Brenton Grills Entered By: Brenton Grills on 11/21/2022 15:16:27 -------------------------------------------------------------------------------- Vitals Details Patient Name: Date of Service: [name], [name] 11/21/2022 3:00 PM Medical Record Number: [id number] Patient Account Number:  [id number] Date of Birth/Sex: Treating RN: 11-Sep-1945 (77 y.o. Yates Decamp Primary Care Andrick Rust: [name] Other Clinician: Referring Kalob Bergen: Treating Dwayn Moravek/Extender: Susette Racer [place], [place] ([id number]) [id number] Page 12 of 12 Weeks  in Treatment: 1 Vital Signs Time Taken: 15:00 Temperature (F): 98 Pulse (bpm): 56 Respiratory Rate (breaths/min): 18 Blood Pressure (mmHg): 131/79 Capillary Blood Glucose (mg/dl): 161 Reference Range: 80 - 120 mg / dl Electronic Signature(s) Signed: 12/12/2022 2:03:55 PM By: Brenton Grills Entered By: Brenton Grills on 11/21/2022 14:58:28

## 2022-12-12 NOTE — Progress Notes (Signed)
QWENTIN, MANER ([id number]) [id number] Page 1 of 9 Visit Report for 11/21/2022 Chief Complaint Document Details Patient Name: Date of Service: Donald RA NT, [name] Donald H. 11/21/2022 3:00 PM Medical Record Number: [id number] Patient Account Number: [id number] Date of Birth/Sex: Treating RN: Aug 22, 1945 (77 y.o. M) Primary Care Provider: [name] Other Clinician: Referring Provider: Treating Provider/Extender: Susette Racer Weeks in Treatment: 1 Information Obtained from: Patient Chief Complaint 11/14/2022; bilateral lower extremity wounds Electronic Signature(s) Signed: 11/21/2022 5:13:05 PM By: Geralyn Corwin DO Entered By: Geralyn Corwin on 11/21/2022 15:41:39 -------------------------------------------------------------------------------- HPI Details Patient Name: Date of Service: Donald RA NT, [name] 11/21/2022 3:00 PM Medical Record Number: [id number] Patient Account Number: [id number] Date of Birth/Sex: Treating RN: [birth year] (77 y.o. M) Primary Care Provider: [name] Other Clinician: Referring Provider: Treating Provider/Extender: Susette Racer Weeks in Treatment: 1 History of Present Illness HPI Description: 11/14/2022 ABI Left: 0.96 and ABI Right: 1.03 11/14/2022 [name] is a 77 year old male with a past medical history of controlled type 2 diabetes on oral agents, hypertension, OSA and venous insufficiency that presents to the clinic for a 1-37-month history of increased swelling to the lower extremities with wounds. He visited urgent care on 7/20 for this issue. He was started on doxycycline for potential cellulitis and and is currently taking the medication. He does not have compression stockings. He states that the wounds on his legs happened spontaneously. He has been using soap and water and keeping the areas covered. He currently denies signs of infection. 8/6; patient presents for follow-up. We have been using  antibiotic ointment with silver alginate under Kerlix/Coban. He has done well with this and most of his wounds have epithelialized. He has a few scattered open areas. He has his compression stockings with him today. He has no issues or complaints. Electronic Signature(s) Signed: 11/21/2022 5:13:05 PM By: Geralyn Corwin DO Entered By: Geralyn Corwin on 11/21/2022 15:42:10 -------------------------------------------------------------------------------- Physical Exam Details Patient Name: Date of Service: Donald RA NT, [name] 11/21/2022 3:00 PM Donald Jones, KRIENKE ([id number]) [id number] Page 2 of 9 Medical Record Number: [id number] Patient Account Number: [id number] Date of Birth/Sex: Treating RN: 1945-06-05 (77 y.o. M) Primary Care Provider: [name] Other Clinician: Referring Provider: Treating Provider/Extender: Susette Racer Weeks in Treatment: 1 Constitutional respirations regular, non-labored and within target range for patient.. Cardiovascular 2+ dorsalis pedis/posterior tibialis pulses. Psychiatric pleasant and cooperative. Notes Previous scattered open wounds limited to skin breakdown are mostly epithelialized. He has a few with granulation tissue still present. Good edema control. No signs of infection. Venous stasis dermatitis to the legs bilaterally. Varicosities noted. Electronic Signature(s) Signed: 11/21/2022 5:13:05 PM By: Geralyn Corwin DO Entered By: Geralyn Corwin on 11/21/2022 15:42:52 -------------------------------------------------------------------------------- Physician Orders Details Patient Name: Date of Service: Donald RA NT, [name] 11/21/2022 3:00 PM Medical Record Number: [id number] Patient Account Number: [id number] Date of Birth/Sex: Treating RN: [birth year] (77 y.o. Donald Jones Primary Care Provider: [name] Other Clinician: Referring Provider: Treating Provider/Extender: Susette Racer Weeks  in Treatment: 1 Verbal / Phone Orders: No Diagnosis Coding Follow-up Appointments ppointment in 1 week. - Dr. Mikey Bussing - Room 9 Return A Anesthetic Wound #1 Right,Posterior Lower Leg (In clinic) Topical Lidocaine 5% applied to wound bed Wound #2 Right,Anterior Lower Leg (In clinic) Topical Lidocaine 5% applied to wound bed Wound #3 Left,Medial Lower Leg (In clinic) Topical Lidocaine 5% applied to wound bed Wound #4 Left,Anterior Lower Leg (In clinic) Topical Lidocaine 5%  applied to wound bed Bathing/ Shower/ Hygiene May shower with protection but do not get wound dressing(s) wet. Protect dressing(s) with water repellant cover (for example, large plastic bag) or a cast cover and may then take shower. - may purchase cast protector from [facility], [facility], or [employer] Edema Control - Lymphedema / SCD / Other Bilateral Lower Extremities Elevate legs to the level of the heart or above for 30 minutes daily and/or when sitting for 3-4 times a day throughout the day. Avoid standing for long periods of time. Exercise regularly Moisturize legs daily. Wound Treatment Wound #1 - Lower Leg Wound Laterality: Right, Posterior Cleanser: Soap and Water 1 x Per Week/15 Days Discharge Instructions: May shower and wash wound with dial antibacterial soap and water prior to dressing change. Cleanser: Vashe 5.8 (oz) 1 x Per Week/15 Days Discharge Instructions: Cleanse the wound with Vashe prior to applying a clean dressing using gauze sponges, not tissue or cotton balls. Donald, Jones ([id number]) [id number] Page 3 of 9 Peri-Wound Care: Triamcinolone 15 (g) 1 x Per Week/15 Days Discharge Instructions: Use triamcinolone 15 (g) as directed Prim Dressing: Xeroform Occlusive Gauze Dressing, 4x4 in 1 x Per Week/15 Days ary Discharge Instructions: Apply to wound bed as instructed Secondary Dressing: ABD Pad, 8x10 1 x Per Week/15 Days Discharge Instructions: Apply over primary dressing  as directed. Secured With: Coban Self-Adherent Wrap 4x5 (in/yd) 1 x Per Week/15 Days Discharge Instructions: Secure with Coban as directed. Secured With: [employer], 4.5x3.1 (in/yd) 1 x Per Week/15 Days Discharge Instructions: Secure with Kerlix as directed. Wound #2 - Lower Leg Wound Laterality: Right, Anterior Cleanser: Soap and Water 1 x Per Week/15 Days Discharge Instructions: May shower and wash wound with dial antibacterial soap and water prior to dressing change. Cleanser: Vashe 5.8 (oz) 1 x Per Week/15 Days Discharge Instructions: Cleanse the wound with Vashe prior to applying a clean dressing using gauze sponges, not tissue or cotton balls. Peri-Wound Care: Triamcinolone 15 (g) 1 x Per Week/15 Days Discharge Instructions: Use triamcinolone 15 (g) as directed Prim Dressing: Xeroform Occlusive Gauze Dressing, 4x4 in 1 x Per Week/15 Days ary Discharge Instructions: Apply to wound bed as instructed Secondary Dressing: ABD Pad, 8x10 1 x Per Week/15 Days Discharge Instructions: Apply over primary dressing as directed. Secured With: Coban Self-Adherent Wrap 4x5 (in/yd) 1 x Per Week/15 Days Discharge Instructions: Secure with Coban as directed. Secured With: [employer], 4.5x3.1 (in/yd) 1 x Per Week/15 Days Discharge Instructions: Secure with Kerlix as directed. Wound #3 - Lower Leg Wound Laterality: Left, Medial Cleanser: Soap and Water 1 x Per Week/15 Days Discharge Instructions: May shower and wash wound with dial antibacterial soap and water prior to dressing change. Cleanser: Vashe 5.8 (oz) 1 x Per Week/15 Days Discharge Instructions: Cleanse the wound with Vashe prior to applying a clean dressing using gauze sponges, not tissue or cotton balls. Peri-Wound Care: Triamcinolone 15 (g) 1 x Per Week/15 Days Discharge Instructions: Use triamcinolone 15 (g) as directed Prim Dressing: Xeroform Occlusive Gauze Dressing, 4x4 in 1 x Per Week/15 Days ary Discharge  Instructions: Apply to wound bed as instructed Secondary Dressing: ABD Pad, 8x10 1 x Per Week/15 Days Discharge Instructions: Apply over primary dressing as directed. Secured With: Coban Self-Adherent Wrap 4x5 (in/yd) 1 x Per Week/15 Days Discharge Instructions: Secure with Coban as directed. Secured With: [employer], 4.5x3.1 (in/yd) 1 x Per Week/15 Days Discharge Instructions: Secure with Kerlix as directed. Wound #4 - Lower Leg Wound Laterality: Left,  Anterior Cleanser: Soap and Water 1 x Per Week/15 Days Discharge Instructions: May shower and wash wound with dial antibacterial soap and water prior to dressing change. Cleanser: Vashe 5.8 (oz) 1 x Per Week/15 Days Discharge Instructions: Cleanse the wound with Vashe prior to applying a clean dressing using gauze sponges, not tissue or cotton balls. Peri-Wound Care: Triamcinolone 15 (g) 1 x Per Week/15 Days Discharge Instructions: Use triamcinolone 15 (g) as directed Prim Dressing: Xeroform Occlusive Gauze Dressing, 4x4 in 1 x Per Week/15 Days ary Discharge Instructions: Apply to wound bed as instructed Secondary Dressing: ABD Pad, 8x10 1 x Per Week/15 Days Discharge Instructions: Apply over primary dressing as directed. Donald, Jones ([id number]) [id number] Page 4 of 9 Secured With: Coban Self-Adherent Wrap 4x5 (in/yd) 1 x Per Week/15 Days Discharge Instructions: Secure with Coban as directed. Secured With: [employer], 4.5x3.1 (in/yd) 1 x Per Week/15 Days Discharge Instructions: Secure with Kerlix as directed. Electronic Signature(s) Signed: 11/21/2022 5:13:05 PM By: Geralyn Corwin DO Entered By: Geralyn Corwin on 11/21/2022 15:43:02 -------------------------------------------------------------------------------- Problem List Details Patient Name: Date of Service: Donald RA NT, [name] 11/21/2022 3:00 PM Medical Record Number: [id number] Patient Account Number: [id number] Date of  Birth/Sex: Treating RN: 1945-07-16 (77 y.o. Donald Jones Primary Care Provider: [name] Other Clinician: Referring Provider: Treating Provider/Extender: Susette Racer Weeks in Treatment: 1 Active Problems ICD-10 Encounter Code Description Active Date MDM Diagnosis I87.313 Chronic venous hypertension (idiopathic) with ulcer of bilateral lower extremity 11/14/2022 No Yes L97.811 Non-pressure chronic ulcer of other part of right lower leg limited to breakdown 11/14/2022 No Yes of skin L97.821 Non-pressure chronic ulcer of other part of left lower leg limited to breakdown 11/14/2022 No Yes of skin E11.622 Type 2 diabetes mellitus with other skin ulcer 11/14/2022 No Yes Inactive Problems Resolved Problems Electronic Signature(s) Signed: 11/21/2022 5:13:05 PM By: Geralyn Corwin DO Entered By: Geralyn Corwin on 11/21/2022 15:41:14 -------------------------------------------------------------------------------- Progress Note Details Patient Name: Date of Service: Donald RA NT, [name] 11/21/2022 3:00 PM Medical Record Number: [id number] Patient Account Number: [id number] Date of Birth/Sex: Treating RN: 09/20/1945 (77 y.o. JAHMI, DEBRUHL ([id number]) [id number] Page 5 of 9 Primary Care Provider: [name] Other Clinician: Referring Provider: Treating Provider/Extender: Susette Racer Weeks in Treatment: 1 Subjective Chief Complaint Information obtained from Patient 11/14/2022; bilateral lower extremity wounds History of Present Illness (HPI) 11/14/2022 ABI Left: 0.96 and ABI Right: 1.03 11/14/2022 [name] is a 77 year old male with a past medical history of controlled type 2 diabetes on oral agents, hypertension, OSA and venous insufficiency that presents to the clinic for a 1-35-month history of increased swelling to the lower extremities with wounds. He visited urgent care on 7/20 for this issue. He was started on  doxycycline for potential cellulitis and and is currently taking the medication. He does not have compression stockings. He states that the wounds on his legs happened spontaneously. He has been using soap and water and keeping the areas covered. He currently denies signs of infection. 8/6; patient presents for follow-up. We have been using antibiotic ointment with silver alginate under Kerlix/Coban. He has done well with this and most of his wounds have epithelialized. He has a few scattered open areas. He has his compression stockings with him today. He has no issues or complaints. Patient History Family History Cancer - Siblings, Diabetes - Mother,Siblings, Heart Disease - Mother,Siblings, Hypertension - Father,Mother,Siblings, Kidney Disease - Siblings, Stroke - Father. Social History Never smoker,  Marital Status - Married, Alcohol Use - Never, Drug Use - No History, Caffeine Use - Daily. Medical History Ear/Nose/Mouth/Throat Patient has history of Chronic sinus problems/congestion Respiratory Patient has history of Asthma, Sleep Apnea Cardiovascular Patient has history of Hypertension Endocrine Patient has history of Type II Diabetes Musculoskeletal Patient has history of Osteoarthritis Psychiatric Patient has history of Confinement Anxiety - general anxiety Medical A Surgical History Notes nd Cardiovascular high cholesterol, Gastrointestinal GERD Endocrine subclinical hyperthyroidism Genitourinary hx kidney stones Integumentary (Skin) chronic dermatitis Musculoskeletal degenerative joint right foot drop Objective Constitutional respirations regular, non-labored and within target range for patient.. Vitals Time Taken: 3:00 PM, Temperature: 98 F, Pulse: 56 bpm, Respiratory Rate: 18 breaths/min, Blood Pressure: 131/79 mmHg, Capillary Blood Glucose: 134 mg/dl. Cardiovascular 2+ dorsalis pedis/posterior tibialis pulses. Psychiatric pleasant and cooperative. General  Notes: Previous scattered open wounds limited to skin breakdown are mostly epithelialized. He has a few with granulation tissue still present. Good edema control. No signs of infection. Venous stasis dermatitis to the legs bilaterally. Varicosities noted. Integumentary (Hair, Skin) SHOOTER, VILLAPANDO ([id number]) [id number] Page 6 of 9 Wound #1 status is Open. Original cause of wound was Gradually Appeared. The date acquired was: 11/06/2022. The wound has been in treatment 1 weeks. The wound is located on the Right,Posterior Lower Leg. The wound measures 0.4cm length x 0.4cm width x 0.1cm depth; 0.126cm^2 area and 0.013cm^3 volume. There is Fat Layer (Subcutaneous Tissue) exposed. There is no tunneling or undermining noted. There is a medium amount of serosanguineous drainage noted. There is no granulation within the wound bed. There is a large (67-100%) amount of necrotic tissue within the wound bed including Eschar and Adherent Slough. The periwound skin appearance exhibited: Hemosiderin Staining. Wound #2 status is Open. Original cause of wound was Gradually Appeared. The date acquired was: 11/06/2022. The wound has been in treatment 1 weeks. The wound is located on the Right,Anterior Lower Leg. The wound measures 1.1cm length x 0.3cm width x 0.1cm depth; 0.259cm^2 area and 0.026cm^3 volume. There is Fat Layer (Subcutaneous Tissue) exposed. There is no tunneling or undermining noted. There is a medium amount of serosanguineous drainage noted. There is no granulation within the wound bed. There is a large (67-100%) amount of necrotic tissue within the wound bed including Adherent Slough. Wound #3 status is Open. Original cause of wound was Gradually Appeared. The date acquired was: 11/06/2022. The wound has been in treatment 1 weeks. The wound is located on the Left,Medial Lower Leg. The wound measures 0.3cm length x 0.3cm width x 0.1cm depth; 0.071cm^2 area and 0.007cm^3  volume. There is Fat Layer (Subcutaneous Tissue) exposed. There is no tunneling or undermining noted. There is a medium amount of serosanguineous drainage noted. There is no granulation within the wound bed. There is a large (67-100%) amount of necrotic tissue within the wound bed including Eschar and Adherent Slough. The periwound skin appearance exhibited: Hemosiderin Staining. Wound #4 status is Open. Original cause of wound was Gradually Appeared. The date acquired was: 11/06/2022. The wound has been in treatment 1 weeks. The wound is located on the Left,Anterior Lower Leg. The wound measures 0.5cm length x 0.3cm width x 0.1cm depth; 0.118cm^2 area and 0.012cm^3 volume. There is Fat Layer (Subcutaneous Tissue) exposed. There is no tunneling or undermining noted. There is a medium amount of serosanguineous drainage noted. There is no granulation within the wound bed. There is a large (67-100%) amount of necrotic tissue within the wound bed including Eschar and Adherent Slough. Assessment Active  Problems ICD-10 Chronic venous hypertension (idiopathic) with ulcer of bilateral lower extremity Non-pressure chronic ulcer of other part of right lower leg limited to breakdown of skin Non-pressure chronic ulcer of other part of left lower leg limited to breakdown of skin Type 2 diabetes mellitus with other skin ulcer Patient has done well with silver alginate and antibiotic ointment under Kerlix/Coban. Most of his wounds have healed. I recommend at this time switching to Xeroform and continue Kerlix/Coban. Follow-up in 1 week and I am hopeful the wounds will be healed by then. He has his compression stockings and I asked them to bring him back at next clinic visit. Procedures Wound #1 Pre-procedure diagnosis of Wound #1 is a Venous Leg Ulcer located on the Right,Posterior Lower Leg . There was a Three Layer Compression Therapy Procedure by Brenton Grills, RN. Post procedure Diagnosis Wound #1: Same  as Pre-Procedure Wound #2 Pre-procedure diagnosis of Wound #2 is a Venous Leg Ulcer located on the Right,Anterior Lower Leg . There was a Three Layer Compression Therapy Procedure by Brenton Grills, RN. Post procedure Diagnosis Wound #2: Same as Pre-Procedure Wound #3 Pre-procedure diagnosis of Wound #3 is a Venous Leg Ulcer located on the Left,Medial Lower Leg . There was a Three Layer Compression Therapy Procedure by Brenton Grills, RN. Post procedure Diagnosis Wound #3: Same as Pre-Procedure Wound #4 Pre-procedure diagnosis of Wound #4 is a Venous Leg Ulcer located on the Left,Anterior Lower Leg . There was a Three Layer Compression Therapy Procedure by Brenton Grills, RN. Post procedure Diagnosis Wound #4: Same as Pre-Procedure Plan Follow-up Appointments: Return Appointment in 1 week. - Dr. Mikey Bussing - Room 9 Anesthetic: Wound #1 Right,Posterior Lower Leg: (In clinic) Topical Lidocaine 5% applied to wound bed Wound #2 Right,Anterior Lower Leg: (In clinic) Topical Lidocaine 5% applied to wound bed Wound #3 Left,Medial Lower Leg: (In clinic) Topical Lidocaine 5% applied to wound bed Wound #4 Left,Anterior Lower Leg: (In clinic) Topical Lidocaine 5% applied to wound bed Bathing/ Shower/ Hygiene: JARREAU, DIEDRICK ([id number]) [id number] Page 7 of 9 May shower with protection but do not get wound dressing(s) wet. Protect dressing(s) with water repellant cover (for example, large plastic bag) or a cast cover and may then take shower. - may purchase cast protector from [facility], [facility], or [employer] Edema Control - Lymphedema / SCD / Other: Elevate legs to the level of the heart or above for 30 minutes daily and/or when sitting for 3-4 times a day throughout the day. Avoid standing for long periods of time. Exercise regularly Moisturize legs daily. WOUND #1: - Lower Leg Wound Laterality: Right, Posterior Cleanser: Soap and Water 1 x Per Week/15 Days Discharge  Instructions: May shower and wash wound with dial antibacterial soap and water prior to dressing change. Cleanser: Vashe 5.8 (oz) 1 x Per Week/15 Days Discharge Instructions: Cleanse the wound with Vashe prior to applying a clean dressing using gauze sponges, not tissue or cotton balls. Peri-Wound Care: Triamcinolone 15 (g) 1 x Per Week/15 Days Discharge Instructions: Use triamcinolone 15 (g) as directed Prim Dressing: Xeroform Occlusive Gauze Dressing, 4x4 in 1 x Per Week/15 Days ary Discharge Instructions: Apply to wound bed as instructed Secondary Dressing: ABD Pad, 8x10 1 x Per Week/15 Days Discharge Instructions: Apply over primary dressing as directed. Secured With: Coban Self-Adherent Wrap 4x5 (in/yd) 1 x Per Week/15 Days Discharge Instructions: Secure with Coban as directed. Secured With: [employer], 4.5x3.1 (in/yd) 1 x Per Week/15 Days Discharge Instructions: Secure with Kerlix as directed.  WOUND #2: - Lower Leg Wound Laterality: Right, Anterior Cleanser: Soap and Water 1 x Per Week/15 Days Discharge Instructions: May shower and wash wound with dial antibacterial soap and water prior to dressing change. Cleanser: Vashe 5.8 (oz) 1 x Per Week/15 Days Discharge Instructions: Cleanse the wound with Vashe prior to applying a clean dressing using gauze sponges, not tissue or cotton balls. Peri-Wound Care: Triamcinolone 15 (g) 1 x Per Week/15 Days Discharge Instructions: Use triamcinolone 15 (g) as directed Prim Dressing: Xeroform Occlusive Gauze Dressing, 4x4 in 1 x Per Week/15 Days ary Discharge Instructions: Apply to wound bed as instructed Secondary Dressing: ABD Pad, 8x10 1 x Per Week/15 Days Discharge Instructions: Apply over primary dressing as directed. Secured With: Coban Self-Adherent Wrap 4x5 (in/yd) 1 x Per Week/15 Days Discharge Instructions: Secure with Coban as directed. Secured With: [employer], 4.5x3.1 (in/yd) 1 x Per Week/15 Days Discharge  Instructions: Secure with Kerlix as directed. WOUND #3: - Lower Leg Wound Laterality: Left, Medial Cleanser: Soap and Water 1 x Per Week/15 Days Discharge Instructions: May shower and wash wound with dial antibacterial soap and water prior to dressing change. Cleanser: Vashe 5.8 (oz) 1 x Per Week/15 Days Discharge Instructions: Cleanse the wound with Vashe prior to applying a clean dressing using gauze sponges, not tissue or cotton balls. Peri-Wound Care: Triamcinolone 15 (g) 1 x Per Week/15 Days Discharge Instructions: Use triamcinolone 15 (g) as directed Prim Dressing: Xeroform Occlusive Gauze Dressing, 4x4 in 1 x Per Week/15 Days ary Discharge Instructions: Apply to wound bed as instructed Secondary Dressing: ABD Pad, 8x10 1 x Per Week/15 Days Discharge Instructions: Apply over primary dressing as directed. Secured With: Coban Self-Adherent Wrap 4x5 (in/yd) 1 x Per Week/15 Days Discharge Instructions: Secure with Coban as directed. Secured With: [employer], 4.5x3.1 (in/yd) 1 x Per Week/15 Days Discharge Instructions: Secure with Kerlix as directed. WOUND #4: - Lower Leg Wound Laterality: Left, Anterior Cleanser: Soap and Water 1 x Per Week/15 Days Discharge Instructions: May shower and wash wound with dial antibacterial soap and water prior to dressing change. Cleanser: Vashe 5.8 (oz) 1 x Per Week/15 Days Discharge Instructions: Cleanse the wound with Vashe prior to applying a clean dressing using gauze sponges, not tissue or cotton balls. Peri-Wound Care: Triamcinolone 15 (g) 1 x Per Week/15 Days Discharge Instructions: Use triamcinolone 15 (g) as directed Prim Dressing: Xeroform Occlusive Gauze Dressing, 4x4 in 1 x Per Week/15 Days ary Discharge Instructions: Apply to wound bed as instructed Secondary Dressing: ABD Pad, 8x10 1 x Per Week/15 Days Discharge Instructions: Apply over primary dressing as directed. Secured With: Coban Self-Adherent Wrap 4x5 (in/yd) 1 x Per  Week/15 Days Discharge Instructions: Secure with Coban as directed. Secured With: [employer], 4.5x3.1 (in/yd) 1 x Per Week/15 Days Discharge Instructions: Secure with Kerlix as directed. 1. Xeroform under Kerlix/Coban to the lower extremities bilaterally 2. Follow-up in 1 week Electronic Signature(s) Signed: 11/23/2022 1:12:24 PM By: Shawn Stall RN, BSN Signed: 11/24/2022 12:24:18 PM By: Geralyn Corwin DO Previous Signature: 11/21/2022 5:13:05 PM Version By: Geralyn Corwin DO Entered By: Shawn Stall on 11/23/2022 12:52:37 Donald, Jones ([id number]) [id number] Page 8 of 9 -------------------------------------------------------------------------------- HxROS Details Patient Name: Date of Service: Donald RA NT, [name] 11/21/2022 3:00 PM Medical Record Number: [id number] Patient Account Number: [id number] Date of Birth/Sex: Treating RN: 12-Jun-1945 (77 y.o. M) Primary Care Provider: [name] Other Clinician: Referring Provider: Treating Provider/Extender: Susette Racer Weeks in Treatment: 1 Ear/Nose/Mouth/Throat Medical History:  Positive for: Chronic sinus problems/congestion Respiratory Medical History: Positive for: Asthma; Sleep Apnea Cardiovascular Medical History: Positive for: Hypertension Past Medical History Notes: high cholesterol, Gastrointestinal Medical History: Past Medical History Notes: GERD Endocrine Medical History: Positive for: Type II Diabetes Past Medical History Notes: subclinical hyperthyroidism Time with diabetes: 5-60yrs Blood sugar tested every day: Yes Tested : Genitourinary Medical History: Past Medical History Notes: hx kidney stones Integumentary (Skin) Medical History: Past Medical History Notes: chronic dermatitis Musculoskeletal Medical History: Positive for: Osteoarthritis Past Medical History Notes: degenerative joint right foot drop Psychiatric Medical History: Positive for:  Confinement Anxiety - general anxiety HBO Extended History Items Ear/Nose/Mouth/Throat: Chronic sinus problems/congestion Immunizations Pneumococcal Vaccine: Received Pneumococcal Vaccination: Yes Received Pneumococcal Vaccination On or After 60th Birthday: Yes Implantable [address] GAUGE, SKLUZACEK ([id number]) [id number] Page 9 of 9 None Family and Social History Cancer: Yes - Siblings; Diabetes: Yes - Mother,Siblings; Heart Disease: Yes - Mother,Siblings; Hypertension: Yes - Father,Mother,Siblings; Kidney Disease: Yes - Siblings; Stroke: Yes - Father; Never smoker; Marital Status - Married; Alcohol Use: Never; Drug Use: No History; Caffeine Use: Daily; Financial Concerns: No; Food, Clothing or Shelter Needs: No; Support System Lacking: No; Transportation Concerns: No Electronic Signature(s) Signed: 11/21/2022 5:13:05 PM By: Geralyn Corwin DO Entered By: Geralyn Corwin on 11/21/2022 15:42:15 -------------------------------------------------------------------------------- SuperBill Details Patient Name: Date of Service: [name], [name] 11/21/2022 Medical Record Number: [id number] Patient Account Number: [id number] Date of Birth/Sex: Treating RN: Sep 26, 1945 (77 y.o. M) Primary Care Provider: [name] Other Clinician: Referring Provider: Treating Provider/Extender: Susette Racer Weeks in Treatment: 1 Diagnosis Coding ICD-10 Codes Code Description [id number] Chronic venous hypertension (idiopathic) with ulcer of bilateral lower extremity L97.811 Non-pressure chronic ulcer of other part of right lower leg limited to breakdown of skin L97.821 Non-pressure chronic ulcer of other part of left lower leg limited to breakdown of skin E11.622 Type 2 diabetes mellitus with other skin ulcer Facility Procedures : CPT4 Code: [id number] Description: (Facility Use Only) [phone] - APPLY MULTLAY COMPRS LWR LT LEG Modifier: Quantity: 1 : CPT4 Code:  [id number] Description: (Facility Use Only) [id number] - APPLY MULTLAY COMPRS LWR RT LEG Modifier: Quantity: 1 Physician Procedures : CPT4 Code Description Modifier [id number] - WC PHYS LEVEL 3 - EST PT ICD-10 Diagnosis Description I87.313 Chronic venous hypertension (idiopathic) with ulcer of bilateral lower extremity L97.811 Non-pressure chronic ulcer of other part of right  lower leg limited to breakdown of skin L97.821 Non-pressure chronic ulcer of other part of left lower leg limited to breakdown of skin E11.622 Type 2 diabetes mellitus with other skin ulcer Quantity: 1 Electronic Signature(s) Signed: 11/21/2022 5:13:05 PM By: Geralyn Corwin DO Signed: 12/12/2022 2:03:55 PM By: Brenton Grills Entered By: Brenton Grills on 11/21/2022 15:51:54

## 2022-12-19 ENCOUNTER — Encounter: Payer: Self-pay | Admitting: Internal Medicine

## 2022-12-19 ENCOUNTER — Ambulatory Visit: Payer: Medicare Other | Admitting: Internal Medicine

## 2022-12-23 ENCOUNTER — Other Ambulatory Visit: Payer: Self-pay | Admitting: Internal Medicine

## 2022-12-27 ENCOUNTER — Ambulatory Visit (INDEPENDENT_AMBULATORY_CARE_PROVIDER_SITE_OTHER): Payer: Medicare Other | Admitting: Internal Medicine

## 2022-12-27 ENCOUNTER — Other Ambulatory Visit (HOSPITAL_BASED_OUTPATIENT_CLINIC_OR_DEPARTMENT_OTHER): Payer: Self-pay

## 2022-12-27 ENCOUNTER — Encounter: Payer: Self-pay | Admitting: Internal Medicine

## 2022-12-27 VITALS — BP 120/70 | HR 56 | Temp 98.6°F | Resp 18 | Ht 65.0 in | Wt 241.6 lb

## 2022-12-27 DIAGNOSIS — E0590 Thyrotoxicosis, unspecified without thyrotoxic crisis or storm: Secondary | ICD-10-CM

## 2022-12-27 DIAGNOSIS — I10 Essential (primary) hypertension: Secondary | ICD-10-CM

## 2022-12-27 DIAGNOSIS — Z7984 Long term (current) use of oral hypoglycemic drugs: Secondary | ICD-10-CM | POA: Diagnosis not present

## 2022-12-27 DIAGNOSIS — E11620 Type 2 diabetes mellitus with diabetic dermatitis: Secondary | ICD-10-CM | POA: Diagnosis not present

## 2022-12-27 LAB — BASIC METABOLIC PANEL
BUN: 16 mg/dL (ref 6–23)
CO2: 30 meq/L (ref 19–32)
Calcium: 10 mg/dL (ref 8.4–10.5)
Chloride: 101 meq/L (ref 96–112)
Creatinine, Ser: 1.52 mg/dL — ABNORMAL HIGH (ref 0.40–1.50)
GFR: 44.68 mL/min — ABNORMAL LOW (ref >60.00)
Glucose, Bld: 258 mg/dL — ABNORMAL HIGH (ref 70–99)
Potassium: 4.7 meq/L (ref 3.5–5.1)
Sodium: 137 meq/L (ref 135–145)

## 2022-12-27 LAB — HEMOGLOBIN A1C: Hgb A1c MFr Bld: 8.1 % — ABNORMAL HIGH (ref 4.6–6.5)

## 2022-12-27 MED ORDER — KETOCONAZOLE 2 % EX CREA: 1.0000 | TOPICAL_CREAM | Freq: Every day | CUTANEOUS | 0 refills | Status: DC

## 2022-12-27 MED ORDER — RSVPREF3 VAC RECOMB ADJUVANTED 120 MCG/0.5ML IM SUSR
0.5000 mL | Freq: Once | INTRAMUSCULAR | 0 refills | Status: AC
  Filled 2022-12-27: qty 0.5, 1d supply, fill #0

## 2022-12-27 MED ORDER — INFLUENZA VAC A&B SURF ANT ADJ 0.5 ML IM SUSY
0.5000 mL | PREFILLED_SYRINGE | Freq: Once | INTRAMUSCULAR | 0 refills | Status: AC
  Filled 2022-12-27: qty 0.5, 1d supply, fill #0

## 2022-12-27 NOTE — Progress Notes (Addendum)
Subjective:    Patient ID: Donald Jones, male    DOB: 1945-08-29, 77 y.o.   MRN: [id number]  DOS:  12/27/2022 Type of visit - description: Routine office visit  Overall feels well. 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  Now under the care of of the wound care center. Ambulatory CBGs in the 120s to 140s. 2 weeks ago developed very small sore at the foreskin.  No swelling, no dysuria, no discharge. Has applied Neosporin.  Review of Systems See above   Past Medical History:  Diagnosis Date   Anxiety    Arthritis    hands right   Diabetes mellitus without complication (HCC)    GERD (gastroesophageal reflux disease)    Hypertension    Kidney stones    several episodes.  Last one in 2011.     Obesity    100-lb weight loss since 2011.    Polycythemia    Sleep apnea    on Cpap    Past Surgical History:  Procedure Laterality Date   CHOLECYSTECTOMY     HAND SURGERY  2010   R hand , d/t a injury, 3 surgeries     Current Outpatient Medications  Medication Instructions   Accu-Chek FastClix Lancets MISC Use to check blood glucose once a day (Dx: type 2 DM - E 11.9)   albuterol (VENTOLIN HFA) 108 (90 Base) MCG/ACT inhaler 2 puffs, Inhalation, Every 6 hours PRN   AMBULATORY NON FORMULARY MEDICATION GI Cocktail - 90 mls of 2% Lidocaine<BR>                     90 mls Dicyclomine 10mg /90ml<BR>                   270 mls Maalox<BR>Take 5-10 ml every 4-6 hours as needed   amLODipine (NORVASC) 2.5 mg, Oral, Daily   aspirin EC 81 mg, Oral, Daily, Each morning. Swallow whole.    atorvastatin (LIPITOR) 40 mg, Oral, Daily at bedtime   BREO ELLIPTA 100-25 MCG/ACT AEPB 1 puff, Inhalation, Daily   [place] Oil-Levomenthol (FDGARD) 25-20.75 MG CAPS 2 tablets, Oral, 2 times daily   diclofenac sodium (VOLTAREN) 4 g, Topical, 3 times daily PRN   dicyclomine (BENTYL) 20 mg, Oral, 3 times daily before meals   doxycycline (VIBRAMYCIN) 100 mg, Oral, 2 times daily   escitalopram (LEXAPRO) 10  mg, Oral, Daily   famotidine (PEPCID) 20 mg, Oral, 2 times daily   Flaxseed Oil 2,000 mg, Oral, Daily   fluticasone (FLONASE) 50 MCG/ACT nasal spray 1 spray, Each Nare, Daily   furosemide (LASIX) 40 mg, Oral, Daily   gabapentin (NEURONTIN) 300 mg, Oral, Daily at bedtime   glucose blood (ACCU-CHEK GUIDE) test strip Use to check blood glucose once a day (Dx: type 2 DM - E 11.9)   hydrocortisone 2.5 % cream Topical, 2 times daily   influenza vaccine adjuvanted (FLUAD) 0.5 ML injection 0.5 mLs, Intramuscular,  Once   Jardiance 10 mg, Oral, Daily before breakfast   ketoconazole (NIZORAL) 2 % cream 1 Application, Topical, Daily   losartan (COZAAR) 100 mg, Oral, Daily   meclizine (ANTIVERT) 12.5 mg, Oral, 3 times daily PRN   Multiple Vitamin (MULTIVITAMIN) tablet 1 tablet, Oral, Daily   omeprazole (PRILOSEC) 20 mg, Oral, Daily   RSV vaccine recomb adjuvanted (AREXVY) 120 MCG/0.5ML injection 0.5 mLs, Intramuscular,  Once   sildenafil (REVATIO) 20 MG tablet TAKE 3 TO 4 TABLETS BY MOUTH AT BEDTIME AS NEEDED.  sitaGLIPtin (JANUVIA) 100 mg, Oral, Daily       Objective:   Physical Exam BP 120/70 (BP Location: Left Arm, Patient Position: Sitting, Cuff Size: Large)   Pulse (!) 56   Temp 98.6 F (37 C) (Oral)   Resp 18   Ht 5\' 5"  (1.651 m)   Wt 241 lb 9.6 oz (109.6 kg)   SpO2 96%   BMI 40.20 kg/m  General:   Well developed, NAD, BMI noted. HEENT:  Normocephalic . Face symmetric, atraumatic Lungs:  CTA B Normal respiratory effort, no intercostal retractions, no accessory muscle use. Heart: RRR,  no murmur. GU: Patient is circumcised, foreskin with very mild erythema, no blisters, no swelling. Lower extremities: Chronic changes of the skin noted.  Essentially no edema today. Skin: Not pale. Not jaundice Neurologic:  alert & oriented X3.  Speech normal, gait appropriate for age and unassisted Psych--  Cognition and judgment appear intact.  Cooperative with normal attention span and  concentration.  Behavior appropriate. No anxious or depressed appearing.      Assessment     ASSESSMENT DM--Intolerant to Metformin, see office visit 09/09/2019; actos d/c 2024 d/t edema  HTN Polycythemia- sees hematology, likely d/t OSA-diuretics, JAK2 (-) ~ 2014, last OV 10-2014, f/u prn OSA on CPAP Asthma  Anxiety ("lexapro prn") DJD- saw Dr Jerl Santos before  R Foot drop  GI: -GERD - IBS? GI sx on and off, previously dx w/ IBS -Cscopes: [id number], 09/18/2017 - EGD 04/2018, showed gastritis, no H. pylori, no malignancy Urolithiasis, several episodes CV: Sees cardiology, negative Myoview 2019, CT coronary angiography 08/2019: Minimal disease Chronic dermatitis: Lower extremities, pretibial  PLAN DM:  Currently on Januvia and Jardiance.   Off Actos due to edema, history of metformin intolerance.   He does have mild irritation of the foreskin.  See physical exam. Plan: Check A1c, prescribed Nizoral, definitely call in the next few days if irritation of the penis is not improving.  stop Jardiance ?Marland Kitchen Unable to exercise much, encourage healthy diet HTN: Seems well-controlled, check a BMP. Necrobiosis lipoidica diabeticorum: Since LOV, open wounds got worse, now under the care of wound management Subclinical hyperthyroidism: Since LOV, ultrasound show thyroid nodules.  Subsequently I see that Dr. Roanna Raider ordered a thyroid scan: See full report, a FNA sampling was recommended by radiology but I don't see that was performed, pt will see Endo next month.   Vaccine advice provided: Shot every fall, COVID booster. RTC CPX 3 months

## 2022-12-27 NOTE — Assessment & Plan Note (Addendum)
DM:  Currently on Januvia and Jardiance.   Off Actos due to edema, history of metformin intolerance.   He does have mild irritation of the foreskin.  See physical exam. Plan: Check A1c, prescribed Nizoral, definitely call in the next few days if irritation of the penis is not improving.  stop Jardiance ?Marland Kitchen Unable to exercise much, encourage healthy diet HTN: Seems well-controlled, check a BMP. Necrobiosis lipoidica diabeticorum: Since LOV, open wounds got worse, now under the care of wound management Subclinical hyperthyroidism: Since LOV, ultrasound show thyroid nodules.  Subsequently I see that Dr. Roanna Raider ordered a thyroid scan: See full report, a FNA sampling was recommended by radiology but I don't see that was performed, pt will see Endo next month.   Vaccine advice provided: Shot every fall, COVID booster. RTC CPX 3 months

## 2022-12-27 NOTE — Patient Instructions (Addendum)
Apply Nizoral cream: Twice daily for 10 days.  If not better let me know.  Vaccines I recommend:  Flu shot this fall Covid booster- new this fall  Check the  blood pressure regularly Blood pressure goal:  between 110/65 and  135/85. If it is consistently higher or lower, let me know     GO TO THE LAB : Get the blood work     Next visit with me in 3 months,    please schedule it at the front desk     If you have MyChart, please check frequently in the next few days for your results

## 2022-12-30 ENCOUNTER — Other Ambulatory Visit: Payer: Self-pay | Admitting: Medical

## 2022-12-30 ENCOUNTER — Other Ambulatory Visit: Payer: Self-pay | Admitting: Internal Medicine

## 2022-12-30 DIAGNOSIS — R600 Localized edema: Secondary | ICD-10-CM

## 2023-01-01 ENCOUNTER — Encounter (HOSPITAL_BASED_OUTPATIENT_CLINIC_OR_DEPARTMENT_OTHER): Payer: Medicare Other | Admitting: Internal Medicine

## 2023-01-01 DIAGNOSIS — I10 Essential (primary) hypertension: Secondary | ICD-10-CM | POA: Insufficient documentation

## 2023-01-01 DIAGNOSIS — E11622 Type 2 diabetes mellitus with other skin ulcer: Secondary | ICD-10-CM | POA: Insufficient documentation

## 2023-01-01 DIAGNOSIS — L97821 Non-pressure chronic ulcer of other part of left lower leg limited to breakdown of skin: Secondary | ICD-10-CM | POA: Insufficient documentation

## 2023-01-01 DIAGNOSIS — G4733 Obstructive sleep apnea (adult) (pediatric): Secondary | ICD-10-CM | POA: Insufficient documentation

## 2023-01-01 DIAGNOSIS — L97811 Non-pressure chronic ulcer of other part of right lower leg limited to breakdown of skin: Secondary | ICD-10-CM | POA: Insufficient documentation

## 2023-01-01 DIAGNOSIS — I872 Venous insufficiency (chronic) (peripheral): Secondary | ICD-10-CM | POA: Insufficient documentation

## 2023-01-02 ENCOUNTER — Encounter (HOSPITAL_BASED_OUTPATIENT_CLINIC_OR_DEPARTMENT_OTHER): Payer: Medicare Other | Attending: Internal Medicine | Admitting: Internal Medicine

## 2023-01-02 DIAGNOSIS — S80821A Blister (nonthermal), right lower leg, initial encounter: Secondary | ICD-10-CM | POA: Diagnosis not present

## 2023-01-02 DIAGNOSIS — I10 Essential (primary) hypertension: Secondary | ICD-10-CM | POA: Diagnosis not present

## 2023-01-02 DIAGNOSIS — E11622 Type 2 diabetes mellitus with other skin ulcer: Secondary | ICD-10-CM | POA: Diagnosis not present

## 2023-01-02 DIAGNOSIS — G4733 Obstructive sleep apnea (adult) (pediatric): Secondary | ICD-10-CM | POA: Diagnosis not present

## 2023-01-02 DIAGNOSIS — I872 Venous insufficiency (chronic) (peripheral): Secondary | ICD-10-CM | POA: Diagnosis not present

## 2023-01-02 DIAGNOSIS — L97821 Non-pressure chronic ulcer of other part of left lower leg limited to breakdown of skin: Secondary | ICD-10-CM | POA: Diagnosis not present

## 2023-01-02 DIAGNOSIS — L97811 Non-pressure chronic ulcer of other part of right lower leg limited to breakdown of skin: Secondary | ICD-10-CM | POA: Diagnosis not present

## 2023-01-02 DIAGNOSIS — S80822A Blister (nonthermal), left lower leg, initial encounter: Secondary | ICD-10-CM | POA: Diagnosis not present

## 2023-01-03 ENCOUNTER — Telehealth: Payer: Self-pay | Admitting: Internal Medicine

## 2023-01-03 MED ORDER — TIRZEPATIDE 2.5 MG/0.5ML ~~LOC~~ SOAJ: 2.5000 mg | SUBCUTANEOUS | 0 refills | Status: DC

## 2023-01-03 NOTE — Addendum Note (Signed)
Addended byConrad Wales D on: 01/03/2023 10:46 AM   Modules accepted: Orders

## 2023-01-03 NOTE — Progress Notes (Signed)
Donald Jones, Donald Jones ([id number]) [id number] Page 1 of 6 Visit Report for 01/02/2023 HPI Details Patient Name: Date of Service: [name], [name] Donald H. 01/02/2023 8:00 A M Medical Record Number: [id number] Patient Account Number: [id number] Date of Birth/Sex: Treating RN: 1946-03-17 (77 y.o. M) Primary Care Provider: [name] Other Clinician: Referring Provider: Treating Provider/Extender: [name], [name] in Treatment: 7 History of Present Illness HPI Description: 11/14/2022 ABI Left: 0.96 and ABI Right: 1.03 11/14/2022 [name] is a 77 year old male with a past medical history of controlled type 2 diabetes on oral agents, hypertension, OSA and venous insufficiency that presents to the clinic for a 1-75-month history of increased swelling to the lower extremities with wounds. He visited urgent care on 7/20 for this issue. He was started on doxycycline for potential cellulitis and and is currently taking the medication. He does not have compression stockings. He states that the wounds on his legs happened spontaneously. He has been using soap and water and keeping the areas covered. He currently denies signs of infection. 8/6; patient presents for follow-up. We have been using antibiotic ointment with silver alginate under Kerlix/Coban. He has done well with this and most of his wounds have epithelialized. He has a few scattered open areas. He has his compression stockings with him today. He has no issues or complaints. 8/13; patient presents for follow-up. Last visit we used Xeroform under Kerlix/Coban to lower extremities bilaterally. His wounds of healed. He has his compression stockings with him today. READMISSION 01/02/2023. Patient is a 77 year old man who is a diabetic on oral agents however he has chronic venous insufficiency with a long history of wounds that usually heal. He was in the clinic from 11/14/2022 through 11/28/2022. He had wounds on  his bilateral lower extremities we use Xeroform kerlix Coban and he had healed when he was discharged here on 11/28/2022. I believe he transitioned into 20/30 below-knee stockings from elastic therapy. It sounds as though he was compliant however 2 days ago noted a blister on the right anterior lower leg that ruptured when he was putting on his stockings. He has not worn his stockings since he has developed an area on the left anterior mid tibia as well. Somewhat painful but not particularly so. Electronic Signature(s) Signed: 01/02/2023 4:34:08 PM By: Baltazar Najjar MD Entered By: Baltazar Najjar on 01/02/2023 08:34:35 -------------------------------------------------------------------------------- Physical Exam Details Patient Name: Date of Service: [name], [name] 01/02/2023 8:00 A M Medical Record Number: [id number] Patient Account Number: [id number] Date of Birth/Sex: Treating RN: Jul 10, 1945 (77 y.o. M) Primary Care Provider: [name] Other Clinician: Referring Provider: Treating Provider/Extender: [name] in Treatment: 7 Constitutional Patient is hypertensive.. Pulse regular and within target range for patient.Marland Kitchen Respirations regular, non-labored and within target range.. Temperature is normal and within the target range for the patient.Marland Kitchen Appears in no distress. Cardiovascular Pedal pulses Palpable bilaterally. Mild nonpitting edema in both lower extremities. Over the extensive mid tibial area erythema probably stasis dermatitis which is not warm and not particularly tender.. Notes Wound exam; small open areas bilaterally more impressive on the right with surrounding erythema probably stasis dermatitis. There is no evidence of cellulitis nontender not warm peripheral pulses are palpable. Electronic Signature(s) Donald Jones, Donald Jones ([id number]) [id number] Page 2 of 6 Signed: 01/02/2023 4:34:08 PM By: Baltazar Najjar MD Entered By:  Baltazar Najjar on 01/02/2023 08:40:17 -------------------------------------------------------------------------------- Physician Orders Details Patient Name: Date of Service: [name], [name] 01/02/2023 8:00  A M Medical Record Number: [id number] Patient Account Number: [id number] Date of Birth/Sex: Treating RN: 07-18-1945 (77 y.o. [name] Primary Care Provider: [name] Other Clinician: Referring Provider: Treating Provider/Extender: [name] in Treatment: 7 Verbal / Phone Orders: No Diagnosis Coding Follow-up Appointments ppointment in 1 week. - Dr Leanord Hawking Tuesday 01/09/23 @ 8:30 am Return A Anesthetic (In clinic) Topical Lidocaine 4% applied to wound bed Bathing/ Shower/ Hygiene May shower with protection but do not get wound dressing(s) wet. Protect dressing(s) with water repellant cover (for example, large plastic bag) or a cast cover and may then take shower. Edema Control - Lymphedema / SCD / Other Bilateral Lower Extremities Elevate legs to the level of the heart or above for 30 minutes daily and/or when sitting for 3-4 times a day throughout the day. Avoid standing for long periods of time. Exercise regularly - walking is good Wound Treatment Wound #5 - Lower Leg Wound Laterality: Left, Medial Cleanser: Soap and Water 1 x Per Week/15 Days Discharge Instructions: May shower and wash wound with dial antibacterial soap and water prior to dressing change. Cleanser: Vashe 5.8 (oz) 1 x Per Week/15 Days Discharge Instructions: Cleanse the wound with Vashe prior to applying a clean dressing using gauze sponges, not tissue or cotton balls. Peri-Wound Care: Triamcinolone 15 (g) 1 x Per Week/15 Days Discharge Instructions: Use triamcinolone 15 (g) as directed Peri-Wound Care: Sween Lotion (Moisturizing lotion) 1 x Per Week/15 Days Discharge Instructions: Apply moisturizing lotion as directed Prim Dressing: Maxorb Extra Ag+ Alginate Dressing, 2x2  (in/in) 1 x Per Week/15 Days ary Discharge Instructions: Apply to wound bed as instructed Secondary Dressing: ABD Pad, 5x9 1 x Per Week/15 Days Discharge Instructions: Apply over primary dressing as directed. Compression Wrap: Urgo K2 Lite, (equivalent to a 3 layer) two layer compression system, regular 1 x Per Week/15 Days Discharge Instructions: Apply Urgo K2 Lite as directed (alternative to 3 layer compression). Wound #6 - Lower Leg Wound Laterality: Right, Anterior Cleanser: Soap and Water 1 x Per Week/15 Days Discharge Instructions: May shower and wash wound with dial antibacterial soap and water prior to dressing change. Cleanser: Vashe 5.8 (oz) 1 x Per Week/15 Days Discharge Instructions: Cleanse the wound with Vashe prior to applying a clean dressing using gauze sponges, not tissue or cotton balls. Peri-Wound Care: Triamcinolone 15 (g) 1 x Per Week/15 Days Discharge Instructions: Use triamcinolone 15 (g) as directed Peri-Wound Care: Sween Lotion (Moisturizing lotion) 1 x Per Week/15 Days Discharge Instructions: Apply moisturizing lotion as directed Prim Dressing: Maxorb Extra Ag+ Alginate Dressing, 2x2 (in/in) 1 x Per Week/15 Days ary Discharge Instructions: Apply to wound bed as instructed Donald Jones, Donald Jones ([id number]) [phone].pdf Page 3 of 6 Secondary Dressing: ABD Pad, 5x9 1 x Per Week/15 Days Discharge Instructions: Apply over primary dressing as directed. Compression Wrap: Urgo K2 Lite, (equivalent to a 3 layer) two layer compression system, regular 1 x Per Week/15 Days Discharge Instructions: Apply Urgo K2 Lite as directed (alternative to 3 layer compression). Patient Medications llergies: metformin, Sulfa (Sulfonamide Antibiotics) A Notifications Medication Indication Start End 01/02/2023 lidocaine DOSE topical 4 % cream - cream topical once daily Electronic Signature(s) Signed: 01/02/2023 4:34:08 PM By: Baltazar Najjar MD Signed: 01/02/2023  5:20:06 PM By: [name] RN, BSN Entered By: [name] on 01/02/2023 08:31:37 -------------------------------------------------------------------------------- Problem List Details Patient Name: Date of Service: [name], [name] 01/02/2023 8:00 A M Medical Record Number: [id number] Patient Account Number: [id number] Date of Birth/Sex:  Donald Jones, Donald Jones ([id number]) [id number] Page 1 of 6 Visit Report for 01/02/2023 HPI Details Patient Name: Date of Service: [name], [name] Donald H. 01/02/2023 8:00 A M Medical Record Number: [id number] Patient Account Number: [id number] Date of Birth/Sex: Treating RN: 1946-03-17 (77 y.o. M) Primary Care Provider: [name] Other Clinician: Referring Provider: Treating Provider/Extender: [name], [name] in Treatment: 7 History of Present Illness HPI Description: 11/14/2022 ABI Left: 0.96 and ABI Right: 1.03 11/14/2022 [name] is a 77 year old male with a past medical history of controlled type 2 diabetes on oral agents, hypertension, OSA and venous insufficiency that presents to the clinic for a 1-75-month history of increased swelling to the lower extremities with wounds. He visited urgent care on 7/20 for this issue. He was started on doxycycline for potential cellulitis and and is currently taking the medication. He does not have compression stockings. He states that the wounds on his legs happened spontaneously. He has been using soap and water and keeping the areas covered. He currently denies signs of infection. 8/6; patient presents for follow-up. We have been using antibiotic ointment with silver alginate under Kerlix/Coban. He has done well with this and most of his wounds have epithelialized. He has a few scattered open areas. He has his compression stockings with him today. He has no issues or complaints. 8/13; patient presents for follow-up. Last visit we used Xeroform under Kerlix/Coban to lower extremities bilaterally. His wounds of healed. He has his compression stockings with him today. READMISSION 01/02/2023. Patient is a 77 year old man who is a diabetic on oral agents however he has chronic venous insufficiency with a long history of wounds that usually heal. He was in the clinic from 11/14/2022 through 11/28/2022. He had wounds on  his bilateral lower extremities we use Xeroform kerlix Coban and he had healed when he was discharged here on 11/28/2022. I believe he transitioned into 20/30 below-knee stockings from elastic therapy. It sounds as though he was compliant however 2 days ago noted a blister on the right anterior lower leg that ruptured when he was putting on his stockings. He has not worn his stockings since he has developed an area on the left anterior mid tibia as well. Somewhat painful but not particularly so. Electronic Signature(s) Signed: 01/02/2023 4:34:08 PM By: Baltazar Najjar MD Entered By: Baltazar Najjar on 01/02/2023 08:34:35 -------------------------------------------------------------------------------- Physical Exam Details Patient Name: Date of Service: [name], [name] 01/02/2023 8:00 A M Medical Record Number: [id number] Patient Account Number: [id number] Date of Birth/Sex: Treating RN: Jul 10, 1945 (77 y.o. M) Primary Care Provider: [name] Other Clinician: Referring Provider: Treating Provider/Extender: [name] in Treatment: 7 Constitutional Patient is hypertensive.. Pulse regular and within target range for patient.Marland Kitchen Respirations regular, non-labored and within target range.. Temperature is normal and within the target range for the patient.Marland Kitchen Appears in no distress. Cardiovascular Pedal pulses Palpable bilaterally. Mild nonpitting edema in both lower extremities. Over the extensive mid tibial area erythema probably stasis dermatitis which is not warm and not particularly tender.. Notes Wound exam; small open areas bilaterally more impressive on the right with surrounding erythema probably stasis dermatitis. There is no evidence of cellulitis nontender not warm peripheral pulses are palpable. Electronic Signature(s) Donald Jones, Donald Jones ([id number]) [id number] Page 2 of 6 Signed: 01/02/2023 4:34:08 PM By: Baltazar Najjar MD Entered By:  Baltazar Najjar on 01/02/2023 08:40:17 -------------------------------------------------------------------------------- Physician Orders Details Patient Name: Date of Service: [name], [name] 01/02/2023 8:00  A M Medical Record Number: [id number] Patient Account Number: [id number] Date of Birth/Sex: Treating RN: 07-18-1945 (77 y.o. [name] Primary Care Provider: [name] Other Clinician: Referring Provider: Treating Provider/Extender: [name] in Treatment: 7 Verbal / Phone Orders: No Diagnosis Coding Follow-up Appointments ppointment in 1 week. - Dr Leanord Hawking Tuesday 01/09/23 @ 8:30 am Return A Anesthetic (In clinic) Topical Lidocaine 4% applied to wound bed Bathing/ Shower/ Hygiene May shower with protection but do not get wound dressing(s) wet. Protect dressing(s) with water repellant cover (for example, large plastic bag) or a cast cover and may then take shower. Edema Control - Lymphedema / SCD / Other Bilateral Lower Extremities Elevate legs to the level of the heart or above for 30 minutes daily and/or when sitting for 3-4 times a day throughout the day. Avoid standing for long periods of time. Exercise regularly - walking is good Wound Treatment Wound #5 - Lower Leg Wound Laterality: Left, Medial Cleanser: Soap and Water 1 x Per Week/15 Days Discharge Instructions: May shower and wash wound with dial antibacterial soap and water prior to dressing change. Cleanser: Vashe 5.8 (oz) 1 x Per Week/15 Days Discharge Instructions: Cleanse the wound with Vashe prior to applying a clean dressing using gauze sponges, not tissue or cotton balls. Peri-Wound Care: Triamcinolone 15 (g) 1 x Per Week/15 Days Discharge Instructions: Use triamcinolone 15 (g) as directed Peri-Wound Care: Sween Lotion (Moisturizing lotion) 1 x Per Week/15 Days Discharge Instructions: Apply moisturizing lotion as directed Prim Dressing: Maxorb Extra Ag+ Alginate Dressing, 2x2  (in/in) 1 x Per Week/15 Days ary Discharge Instructions: Apply to wound bed as instructed Secondary Dressing: ABD Pad, 5x9 1 x Per Week/15 Days Discharge Instructions: Apply over primary dressing as directed. Compression Wrap: Urgo K2 Lite, (equivalent to a 3 layer) two layer compression system, regular 1 x Per Week/15 Days Discharge Instructions: Apply Urgo K2 Lite as directed (alternative to 3 layer compression). Wound #6 - Lower Leg Wound Laterality: Right, Anterior Cleanser: Soap and Water 1 x Per Week/15 Days Discharge Instructions: May shower and wash wound with dial antibacterial soap and water prior to dressing change. Cleanser: Vashe 5.8 (oz) 1 x Per Week/15 Days Discharge Instructions: Cleanse the wound with Vashe prior to applying a clean dressing using gauze sponges, not tissue or cotton balls. Peri-Wound Care: Triamcinolone 15 (g) 1 x Per Week/15 Days Discharge Instructions: Use triamcinolone 15 (g) as directed Peri-Wound Care: Sween Lotion (Moisturizing lotion) 1 x Per Week/15 Days Discharge Instructions: Apply moisturizing lotion as directed Prim Dressing: Maxorb Extra Ag+ Alginate Dressing, 2x2 (in/in) 1 x Per Week/15 Days ary Discharge Instructions: Apply to wound bed as instructed Donald Jones, Donald Jones ([id number]) [phone].pdf Page 3 of 6 Secondary Dressing: ABD Pad, 5x9 1 x Per Week/15 Days Discharge Instructions: Apply over primary dressing as directed. Compression Wrap: Urgo K2 Lite, (equivalent to a 3 layer) two layer compression system, regular 1 x Per Week/15 Days Discharge Instructions: Apply Urgo K2 Lite as directed (alternative to 3 layer compression). Patient Medications llergies: metformin, Sulfa (Sulfonamide Antibiotics) A Notifications Medication Indication Start End 01/02/2023 lidocaine DOSE topical 4 % cream - cream topical once daily Electronic Signature(s) Signed: 01/02/2023 4:34:08 PM By: Baltazar Najjar MD Signed: 01/02/2023  5:20:06 PM By: [name] RN, BSN Entered By: [name] on 01/02/2023 08:31:37 -------------------------------------------------------------------------------- Problem List Details Patient Name: Date of Service: [name], [name] 01/02/2023 8:00 A M Medical Record Number: [id number] Patient Account Number: [id number] Date of Birth/Sex:  Donald Jones, Donald Jones ([id number]) [id number] Page 1 of 6 Visit Report for 01/02/2023 HPI Details Patient Name: Date of Service: [name], [name] Donald H. 01/02/2023 8:00 A M Medical Record Number: [id number] Patient Account Number: [id number] Date of Birth/Sex: Treating RN: 1946-03-17 (77 y.o. M) Primary Care Provider: [name] Other Clinician: Referring Provider: Treating Provider/Extender: [name], [name] in Treatment: 7 History of Present Illness HPI Description: 11/14/2022 ABI Left: 0.96 and ABI Right: 1.03 11/14/2022 [name] is a 77 year old male with a past medical history of controlled type 2 diabetes on oral agents, hypertension, OSA and venous insufficiency that presents to the clinic for a 1-75-month history of increased swelling to the lower extremities with wounds. He visited urgent care on 7/20 for this issue. He was started on doxycycline for potential cellulitis and and is currently taking the medication. He does not have compression stockings. He states that the wounds on his legs happened spontaneously. He has been using soap and water and keeping the areas covered. He currently denies signs of infection. 8/6; patient presents for follow-up. We have been using antibiotic ointment with silver alginate under Kerlix/Coban. He has done well with this and most of his wounds have epithelialized. He has a few scattered open areas. He has his compression stockings with him today. He has no issues or complaints. 8/13; patient presents for follow-up. Last visit we used Xeroform under Kerlix/Coban to lower extremities bilaterally. His wounds of healed. He has his compression stockings with him today. READMISSION 01/02/2023. Patient is a 77 year old man who is a diabetic on oral agents however he has chronic venous insufficiency with a long history of wounds that usually heal. He was in the clinic from 11/14/2022 through 11/28/2022. He had wounds on  his bilateral lower extremities we use Xeroform kerlix Coban and he had healed when he was discharged here on 11/28/2022. I believe he transitioned into 20/30 below-knee stockings from elastic therapy. It sounds as though he was compliant however 2 days ago noted a blister on the right anterior lower leg that ruptured when he was putting on his stockings. He has not worn his stockings since he has developed an area on the left anterior mid tibia as well. Somewhat painful but not particularly so. Electronic Signature(s) Signed: 01/02/2023 4:34:08 PM By: Baltazar Najjar MD Entered By: Baltazar Najjar on 01/02/2023 08:34:35 -------------------------------------------------------------------------------- Physical Exam Details Patient Name: Date of Service: [name], [name] 01/02/2023 8:00 A M Medical Record Number: [id number] Patient Account Number: [id number] Date of Birth/Sex: Treating RN: Jul 10, 1945 (77 y.o. M) Primary Care Provider: [name] Other Clinician: Referring Provider: Treating Provider/Extender: [name] in Treatment: 7 Constitutional Patient is hypertensive.. Pulse regular and within target range for patient.Marland Kitchen Respirations regular, non-labored and within target range.. Temperature is normal and within the target range for the patient.Marland Kitchen Appears in no distress. Cardiovascular Pedal pulses Palpable bilaterally. Mild nonpitting edema in both lower extremities. Over the extensive mid tibial area erythema probably stasis dermatitis which is not warm and not particularly tender.. Notes Wound exam; small open areas bilaterally more impressive on the right with surrounding erythema probably stasis dermatitis. There is no evidence of cellulitis nontender not warm peripheral pulses are palpable. Electronic Signature(s) Donald Jones, Donald Jones ([id number]) [id number] Page 2 of 6 Signed: 01/02/2023 4:34:08 PM By: Baltazar Najjar MD Entered By:  Baltazar Najjar on 01/02/2023 08:40:17 -------------------------------------------------------------------------------- Physician Orders Details Patient Name: Date of Service: [name], [name] 01/02/2023 8:00  A M Medical Record Number: [id number] Patient Account Number: [id number] Date of Birth/Sex: Treating RN: 07-18-1945 (77 y.o. [name] Primary Care Provider: [name] Other Clinician: Referring Provider: Treating Provider/Extender: [name] in Treatment: 7 Verbal / Phone Orders: No Diagnosis Coding Follow-up Appointments ppointment in 1 week. - Dr Leanord Hawking Tuesday 01/09/23 @ 8:30 am Return A Anesthetic (In clinic) Topical Lidocaine 4% applied to wound bed Bathing/ Shower/ Hygiene May shower with protection but do not get wound dressing(s) wet. Protect dressing(s) with water repellant cover (for example, large plastic bag) or a cast cover and may then take shower. Edema Control - Lymphedema / SCD / Other Bilateral Lower Extremities Elevate legs to the level of the heart or above for 30 minutes daily and/or when sitting for 3-4 times a day throughout the day. Avoid standing for long periods of time. Exercise regularly - walking is good Wound Treatment Wound #5 - Lower Leg Wound Laterality: Left, Medial Cleanser: Soap and Water 1 x Per Week/15 Days Discharge Instructions: May shower and wash wound with dial antibacterial soap and water prior to dressing change. Cleanser: Vashe 5.8 (oz) 1 x Per Week/15 Days Discharge Instructions: Cleanse the wound with Vashe prior to applying a clean dressing using gauze sponges, not tissue or cotton balls. Peri-Wound Care: Triamcinolone 15 (g) 1 x Per Week/15 Days Discharge Instructions: Use triamcinolone 15 (g) as directed Peri-Wound Care: Sween Lotion (Moisturizing lotion) 1 x Per Week/15 Days Discharge Instructions: Apply moisturizing lotion as directed Prim Dressing: Maxorb Extra Ag+ Alginate Dressing, 2x2  (in/in) 1 x Per Week/15 Days ary Discharge Instructions: Apply to wound bed as instructed Secondary Dressing: ABD Pad, 5x9 1 x Per Week/15 Days Discharge Instructions: Apply over primary dressing as directed. Compression Wrap: Urgo K2 Lite, (equivalent to a 3 layer) two layer compression system, regular 1 x Per Week/15 Days Discharge Instructions: Apply Urgo K2 Lite as directed (alternative to 3 layer compression). Wound #6 - Lower Leg Wound Laterality: Right, Anterior Cleanser: Soap and Water 1 x Per Week/15 Days Discharge Instructions: May shower and wash wound with dial antibacterial soap and water prior to dressing change. Cleanser: Vashe 5.8 (oz) 1 x Per Week/15 Days Discharge Instructions: Cleanse the wound with Vashe prior to applying a clean dressing using gauze sponges, not tissue or cotton balls. Peri-Wound Care: Triamcinolone 15 (g) 1 x Per Week/15 Days Discharge Instructions: Use triamcinolone 15 (g) as directed Peri-Wound Care: Sween Lotion (Moisturizing lotion) 1 x Per Week/15 Days Discharge Instructions: Apply moisturizing lotion as directed Prim Dressing: Maxorb Extra Ag+ Alginate Dressing, 2x2 (in/in) 1 x Per Week/15 Days ary Discharge Instructions: Apply to wound bed as instructed Donald Jones, Donald Jones ([id number]) [phone].pdf Page 3 of 6 Secondary Dressing: ABD Pad, 5x9 1 x Per Week/15 Days Discharge Instructions: Apply over primary dressing as directed. Compression Wrap: Urgo K2 Lite, (equivalent to a 3 layer) two layer compression system, regular 1 x Per Week/15 Days Discharge Instructions: Apply Urgo K2 Lite as directed (alternative to 3 layer compression). Patient Medications llergies: metformin, Sulfa (Sulfonamide Antibiotics) A Notifications Medication Indication Start End 01/02/2023 lidocaine DOSE topical 4 % cream - cream topical once daily Electronic Signature(s) Signed: 01/02/2023 4:34:08 PM By: Baltazar Najjar MD Signed: 01/02/2023  5:20:06 PM By: [name] RN, BSN Entered By: [name] on 01/02/2023 08:31:37 -------------------------------------------------------------------------------- Problem List Details Patient Name: Date of Service: [name], [name] 01/02/2023 8:00 A M Medical Record Number: [id number] Patient Account Number: [id number] Date of Birth/Sex:

## 2023-01-03 NOTE — Addendum Note (Signed)
Addended byConrad Kankakee D on: 01/03/2023 10:47 AM   Modules accepted: Orders

## 2023-01-03 NOTE — Progress Notes (Signed)
Donald Jones, Donald Jones ([id number]) [id number] Page 1 of 9 Visit Report for 01/02/2023 Arrival Information Details Patient Name: Date of Service: [name], [name] Donald Jones. 01/02/2023 8:00 A M Medical Record Number: [id number] Patient Account Number: [id number] Date of Birth/Sex: Treating RN: [birth year] (77 y.o. Donald Jones Primary Care Donald Jones: Donald Jones Other Clinician: Referring Donald Jones: Treating Donald Jones: Donald Jones in Treatment: 7 Visit Information History Since Last Visit Added or deleted any medications: No Patient Arrived: Ambulatory Any new allergies or adverse reactions: No Arrival Time: 08:10 Had a fall or experienced change in No Accompanied By: self activities of daily living that may affect Transfer Assistance: None risk of falls: Patient Identification Verified: Yes Signs or symptoms of abuse/neglect since last visito No Secondary Verification Process Completed: Yes Hospitalized since last visit: No Patient Has Alerts: Yes Implantable device outside of the clinic excluding No Patient Alerts: Patient on Blood Thinner cellular tissue based products placed in the center since last visit: Has Dressing in Place as Prescribed: No Has Compression in Place as Prescribed: No Pain Present Now: No Electronic Signature(s) Signed: 01/02/2023 5:20:06 PM By: Donald Pulling RN, BSN Entered By: Donald Jones on 01/02/2023 05:10:43 -------------------------------------------------------------------------------- Compression Therapy Details Patient Name: Date of Service: Donald Jones Donald Jones Donald Jones. 01/02/2023 8:00 A M Medical Record Number: [id number] Patient Account Number: [id number] Date of Birth/Sex: Treating RN: [birth year] (77 y.o. Donald Jones Primary Care Donald Jones: Donald Jones Other Clinician: Referring Donald Jones: Treating Donald Jones/Extender: Donald Jones in Treatment: 7 Compression Therapy Performed for  Wound Assessment: Wound #5 Left,Medial Lower Leg Performed By: Clinician Donald Pulling, RN Compression Type: Three Layer Post Procedure Diagnosis Same as Pre-procedure Electronic Signature(s) Signed: 01/02/2023 5:20:06 PM By: Donald Pulling RN, BSN Entered By: Donald Jones on 01/02/2023 05:49:27 RYN, SKUBAL ([id number]) [id number] Page 2 of 9 -------------------------------------------------------------------------------- Compression Therapy Details Patient Name: Date of Service: FO RA Donald Jones Donald Jones. 01/02/2023 8:00 A M Medical Record Number: [id number] Patient Account Number: [id number] Date of Birth/Sex: Treating RN: 1945-05-25 (77 y.o. Donald Jones Primary Care Lamone Ferrelli: Donald Jones Other Clinician: Referring Donald Jones: Treating Donald Jones/Extender: Donald Jones in Treatment: 7 Compression Therapy Performed for Wound Assessment: Wound #6 Right,Anterior Lower Leg Performed By: Clinician Donald Pulling, RN Compression Type: Three Layer Post Procedure Diagnosis Same as Pre-procedure Electronic Signature(s) Signed: 01/02/2023 5:20:06 PM By: Donald Pulling RN, BSN Entered By: Donald Jones on 01/02/2023 05:49:27 -------------------------------------------------------------------------------- Encounter Discharge Information Details Patient Name: Date of Service: FO RA Donald Jones Donald Jones. 01/02/2023 8:00 A M Medical Record Number: [id number] Patient Account Number: [id number] Date of Birth/Sex: Treating RN: [birth year] (77 y.o. Donald Jones Primary Care Iriel Nason: Donald Jones Other Clinician: Referring Nhi Butrum: Treating Donald Jones/Extender: Donald Jones in Treatment: 7 Encounter Discharge Information Items Discharge Condition: Stable Ambulatory Status: Ambulatory Discharge Destination: Home Transportation: Private Auto Accompanied By: self Schedule Follow-up Appointment: Yes Clinical Summary of Care: Patient  Declined Electronic Signature(s) Signed: 01/02/2023 5:20:06 PM By: Donald Pulling RN, BSN Entered By: Donald Jones on 01/02/2023 05:50:39 -------------------------------------------------------------------------------- Lower Extremity Assessment Details Patient Name: Date of Service: FO RA Donald Jones Donald Jones. 01/02/2023 8:00 A M Medical Record Number: [id number] Patient Account Number: [id number] Date of Birth/Sex: Treating RN: 01-15-1946 (77 y.o. Donald Jones Primary Care Donald Jones: Donald Jones Other Clinician: Referring Donald Jones: Treating Donald Jones/Extender: Donald Jones in Treatment: 7 Edema Assessment Assessed: [Left: No] [Right: No] Edema: [Left: Yes] [Right: Yes] Donald Jones, Donald Jones (  [id number]) [id number] Page 3 of 9 Left: Right: Point of Measurement: 32 cm From Medial Instep 35 cm 33.5 cm Ankle Left: Right: Point of Measurement: 14 cm From Medial Instep 21 cm 20.5 cm Vascular Assessment Pulses: Dorsalis Pedis Palpable: [Left:Yes] [Right:Yes] Extremity colors, hair growth, and conditions: Extremity Color: [Left:Red] [Right:Red] Hair Growth on Extremity: [Left:No] [Right:No] Temperature of Extremity: [Left:Warm] [Right:Warm] Capillary Refill: [Left:< 3 seconds] [Right:< 3 seconds] Dependent Rubor: [Left:No No] [Right:No No] Toe Nail Assessment Left: Right: Thick: Yes Yes Discolored: Yes Yes Deformed: Yes Yes Improper Length and Hygiene: Yes Yes Electronic Signature(s) Signed: 01/02/2023 5:20:06 PM By: Donald Pulling RN, BSN Entered By: Donald Jones on 01/02/2023 05:13:56 -------------------------------------------------------------------------------- Multi Wound Chart Details Patient Name: Date of Service: Donald Jones Donald Jones Donald Jones. 01/02/2023 8:00 A M Medical Record Number: [id number] Patient Account Number: [id number] Date of Birth/Sex: Treating RN: May 31, 1945 (77 y.o. M) Primary Care Donald Jones: Donald Jones Other  Clinician: Referring Laiklyn Pilkenton: Treating Donald Jones/Extender: Donald Jones in Treatment: 7 Vital Signs Height(in): 65 Capillary Blood Glucose(mg/dl): 725 Weight(lbs): 366 Pulse(bpm): 66 Body Mass Index(BMI): 39.9 Blood Pressure(mmHg): 154/81 Temperature(F): 98.3 Respiratory Rate(breaths/min): 18 [5:Photos:] [N/A:N/A] Left, Medial Lower Leg Right, Anterior Lower Leg N/A Wound Location: Blister Blister N/A Wounding Event: Venous Leg Ulcer Venous Leg Ulcer N/A Primary Etiology: Diabetic Wound/Ulcer of the Lower Diabetic Wound/Ulcer of the Lower N/A Secondary Etiology: Extremity Extremity Chronic sinus problems/congestion, Chronic sinus problems/congestion, N/A Comorbid History: Asthma, Sleep Apnea, Hypertension, Asthma, Sleep Apnea, Hypertension, Type II Diabetes, Osteoarthritis, Type II Diabetes, Osteoarthritis, MAXI, VAIL ([id number]) [phone].pdf Page 4 of 9 Confinement Anxiety Confinement Anxiety 12/26/2022 12/19/2022 N/A Date Acquired: 0 0 N/A Weeks of Treatment: Open Open N/A Wound Status: No No N/A Wound Recurrence: No Yes N/A Clustered Wound: N/A 2 N/A Clustered Quantity: 0.5x0.4x0.1 2.4x2.5x0.1 N/A Measurements L x W x D (cm) 0.157 4.712 N/A A (cm) : rea 0.016 0.471 N/A Volume (cm) : Full Thickness Without Exposed Full Thickness Without Exposed N/A Classification: Support Structures Support Structures Medium Medium N/A Exudate A mount: Serosanguineous Serosanguineous N/A Exudate Type: red, brown red, brown N/A Exudate Color: None Present (0%) Small (1-33%) N/A Granulation Amount: N/A Red N/A Granulation Quality: Large (67-100%) Large (67-100%) N/A Necrotic Amount: Eschar, Adherent Slough Eschar, Adherent Slough N/A Necrotic Tissue: Fat Layer (Subcutaneous Tissue): Yes Fat Layer (Subcutaneous Tissue): Yes N/A Exposed Structures: Fascia: No Fascia: No Tendon: No Tendon: No Muscle: No Muscle:  No Joint: No Joint: No Bone: No Bone: No None None N/A Epithelialization: Treatment Notes Electronic Signature(s) Signed: 01/02/2023 4:34:08 PM By: Baltazar Najjar MD Entered By: Baltazar Najjar on 01/02/2023 05:32:56 -------------------------------------------------------------------------------- Multi-Disciplinary Care Plan Details Patient Name: Date of Service: FO RA Donald Jones Donald Jones. 01/02/2023 8:00 A M Medical Record Number: [id number] Patient Account Number: [id number] Date of Birth/Sex: Treating RN: [birth year] (77 y.o. Donald Jones Primary Care Magdelena Kinsella: Donald Jones Other Clinician: Referring Kalisa Girtman: Treating Jaekwon Mcclune/Extender: Donald Jones in Treatment: 7 Active Inactive Venous Leg Ulcer Nursing Diagnoses: Knowledge deficit related to disease process and management Potential for venous Insuffiency (use before diagnosis confirmed) Goals: Patient will maintain optimal edema control Date Initiated: 11/14/2022 Target Resolution Date: 02/06/2023 Goal Status: Active Patient/caregiver will verbalize understanding of disease process and disease management Date Initiated: 11/14/2022 Target Resolution Date: 01/17/2023 Goal Status: Active Interventions: Assess peripheral edema status every visit. Compression as ordered Provide education on venous insufficiency Notes: Donald Jones, Donald Jones ([id number]) [id number] Page 5 of 9 Nursing Diagnoses: Impaired tissue integrity Knowledge  Donald Jones, Donald Jones ([id number]) [id number] Page 1 of 9 Visit Report for 01/02/2023 Arrival Information Details Patient Name: Date of Service: [name], [name] Donald Jones. 01/02/2023 8:00 A M Medical Record Number: [id number] Patient Account Number: [id number] Date of Birth/Sex: Treating RN: [birth year] (77 y.o. Donald Jones Primary Care Donald Jones: Donald Jones Other Clinician: Referring Donald Jones: Treating Donald Jones: Donald Jones in Treatment: 7 Visit Information History Since Last Visit Added or deleted any medications: No Patient Arrived: Ambulatory Any new allergies or adverse reactions: No Arrival Time: 08:10 Had a fall or experienced change in No Accompanied By: self activities of daily living that may affect Transfer Assistance: None risk of falls: Patient Identification Verified: Yes Signs or symptoms of abuse/neglect since last visito No Secondary Verification Process Completed: Yes Hospitalized since last visit: No Patient Has Alerts: Yes Implantable device outside of the clinic excluding No Patient Alerts: Patient on Blood Thinner cellular tissue based products placed in the center since last visit: Has Dressing in Place as Prescribed: No Has Compression in Place as Prescribed: No Pain Present Now: No Electronic Signature(s) Signed: 01/02/2023 5:20:06 PM By: Donald Pulling RN, BSN Entered By: Donald Jones on 01/02/2023 05:10:43 -------------------------------------------------------------------------------- Compression Therapy Details Patient Name: Date of Service: Donald Jones Donald Jones Donald Jones. 01/02/2023 8:00 A M Medical Record Number: [id number] Patient Account Number: [id number] Date of Birth/Sex: Treating RN: [birth year] (77 y.o. Donald Jones Primary Care Donald Jones: Donald Jones Other Clinician: Referring Donald Jones: Treating Donald Jones/Extender: Donald Jones in Treatment: 7 Compression Therapy Performed for  Wound Assessment: Wound #5 Left,Medial Lower Leg Performed By: Clinician Donald Pulling, RN Compression Type: Three Layer Post Procedure Diagnosis Same as Pre-procedure Electronic Signature(s) Signed: 01/02/2023 5:20:06 PM By: Donald Pulling RN, BSN Entered By: Donald Jones on 01/02/2023 05:49:27 RYN, SKUBAL ([id number]) [id number] Page 2 of 9 -------------------------------------------------------------------------------- Compression Therapy Details Patient Name: Date of Service: FO RA Donald Jones Donald Jones. 01/02/2023 8:00 A M Medical Record Number: [id number] Patient Account Number: [id number] Date of Birth/Sex: Treating RN: 1945-05-25 (77 y.o. Donald Jones Primary Care Lamone Ferrelli: Donald Jones Other Clinician: Referring Donald Jones: Treating Donald Jones/Extender: Donald Jones in Treatment: 7 Compression Therapy Performed for Wound Assessment: Wound #6 Right,Anterior Lower Leg Performed By: Clinician Donald Pulling, RN Compression Type: Three Layer Post Procedure Diagnosis Same as Pre-procedure Electronic Signature(s) Signed: 01/02/2023 5:20:06 PM By: Donald Pulling RN, BSN Entered By: Donald Jones on 01/02/2023 05:49:27 -------------------------------------------------------------------------------- Encounter Discharge Information Details Patient Name: Date of Service: FO RA Donald Jones Donald Jones. 01/02/2023 8:00 A M Medical Record Number: [id number] Patient Account Number: [id number] Date of Birth/Sex: Treating RN: [birth year] (77 y.o. Donald Jones Primary Care Iriel Nason: Donald Jones Other Clinician: Referring Nhi Butrum: Treating Donald Jones/Extender: Donald Jones in Treatment: 7 Encounter Discharge Information Items Discharge Condition: Stable Ambulatory Status: Ambulatory Discharge Destination: Home Transportation: Private Auto Accompanied By: self Schedule Follow-up Appointment: Yes Clinical Summary of Care: Patient  Declined Electronic Signature(s) Signed: 01/02/2023 5:20:06 PM By: Donald Pulling RN, BSN Entered By: Donald Jones on 01/02/2023 05:50:39 -------------------------------------------------------------------------------- Lower Extremity Assessment Details Patient Name: Date of Service: FO RA Donald Jones Donald Jones. 01/02/2023 8:00 A M Medical Record Number: [id number] Patient Account Number: [id number] Date of Birth/Sex: Treating RN: 01-15-1946 (77 y.o. Donald Jones Primary Care Donald Jones: Donald Jones Other Clinician: Referring Donald Jones: Treating Donald Jones/Extender: Donald Jones in Treatment: 7 Edema Assessment Assessed: [Left: No] [Right: No] Edema: [Left: Yes] [Right: Yes] Donald Jones, Donald Jones (  Donald Jones, Donald Jones ([id number]) [id number] Page 1 of 9 Visit Report for 01/02/2023 Arrival Information Details Patient Name: Date of Service: [name], [name] Donald Jones. 01/02/2023 8:00 A M Medical Record Number: [id number] Patient Account Number: [id number] Date of Birth/Sex: Treating RN: [birth year] (77 y.o. Donald Jones Primary Care Donald Jones: Donald Jones Other Clinician: Referring Donald Jones: Treating Donald Jones: Donald Jones in Treatment: 7 Visit Information History Since Last Visit Added or deleted any medications: No Patient Arrived: Ambulatory Any new allergies or adverse reactions: No Arrival Time: 08:10 Had a fall or experienced change in No Accompanied By: self activities of daily living that may affect Transfer Assistance: None risk of falls: Patient Identification Verified: Yes Signs or symptoms of abuse/neglect since last visito No Secondary Verification Process Completed: Yes Hospitalized since last visit: No Patient Has Alerts: Yes Implantable device outside of the clinic excluding No Patient Alerts: Patient on Blood Thinner cellular tissue based products placed in the center since last visit: Has Dressing in Place as Prescribed: No Has Compression in Place as Prescribed: No Pain Present Now: No Electronic Signature(s) Signed: 01/02/2023 5:20:06 PM By: Donald Pulling RN, BSN Entered By: Donald Jones on 01/02/2023 05:10:43 -------------------------------------------------------------------------------- Compression Therapy Details Patient Name: Date of Service: Donald Jones Donald Jones Donald Jones. 01/02/2023 8:00 A M Medical Record Number: [id number] Patient Account Number: [id number] Date of Birth/Sex: Treating RN: [birth year] (77 y.o. Donald Jones Primary Care Donald Jones: Donald Jones Other Clinician: Referring Donald Jones: Treating Donald Jones/Extender: Donald Jones in Treatment: 7 Compression Therapy Performed for  Wound Assessment: Wound #5 Left,Medial Lower Leg Performed By: Clinician Donald Pulling, RN Compression Type: Three Layer Post Procedure Diagnosis Same as Pre-procedure Electronic Signature(s) Signed: 01/02/2023 5:20:06 PM By: Donald Pulling RN, BSN Entered By: Donald Jones on 01/02/2023 05:49:27 RYN, SKUBAL ([id number]) [id number] Page 2 of 9 -------------------------------------------------------------------------------- Compression Therapy Details Patient Name: Date of Service: FO RA Donald Jones Donald Jones. 01/02/2023 8:00 A M Medical Record Number: [id number] Patient Account Number: [id number] Date of Birth/Sex: Treating RN: 1945-05-25 (77 y.o. Donald Jones Primary Care Lamone Ferrelli: Donald Jones Other Clinician: Referring Donald Jones: Treating Donald Jones/Extender: Donald Jones in Treatment: 7 Compression Therapy Performed for Wound Assessment: Wound #6 Right,Anterior Lower Leg Performed By: Clinician Donald Pulling, RN Compression Type: Three Layer Post Procedure Diagnosis Same as Pre-procedure Electronic Signature(s) Signed: 01/02/2023 5:20:06 PM By: Donald Pulling RN, BSN Entered By: Donald Jones on 01/02/2023 05:49:27 -------------------------------------------------------------------------------- Encounter Discharge Information Details Patient Name: Date of Service: FO RA Donald Jones Donald Jones. 01/02/2023 8:00 A M Medical Record Number: [id number] Patient Account Number: [id number] Date of Birth/Sex: Treating RN: [birth year] (77 y.o. Donald Jones Primary Care Iriel Nason: Donald Jones Other Clinician: Referring Nhi Butrum: Treating Donald Jones/Extender: Donald Jones in Treatment: 7 Encounter Discharge Information Items Discharge Condition: Stable Ambulatory Status: Ambulatory Discharge Destination: Home Transportation: Private Auto Accompanied By: self Schedule Follow-up Appointment: Yes Clinical Summary of Care: Patient  Declined Electronic Signature(s) Signed: 01/02/2023 5:20:06 PM By: Donald Pulling RN, BSN Entered By: Donald Jones on 01/02/2023 05:50:39 -------------------------------------------------------------------------------- Lower Extremity Assessment Details Patient Name: Date of Service: FO RA Donald Jones Donald Jones. 01/02/2023 8:00 A M Medical Record Number: [id number] Patient Account Number: [id number] Date of Birth/Sex: Treating RN: 01-15-1946 (77 y.o. Donald Jones Primary Care Donald Jones: Donald Jones Other Clinician: Referring Donald Jones: Treating Donald Jones/Extender: Donald Jones in Treatment: 7 Edema Assessment Assessed: [Left: No] [Right: No] Edema: [Left: Yes] [Right: Yes] Donald Jones, Donald Jones (  deficit related to ulceration/compromised skin integrity Goals: Patient/caregiver will verbalize understanding of skin care regimen Date Initiated: 11/14/2022 Target Resolution Date: 02/14/2023 Goal Status: Active Ulcer/skin breakdown will have a volume reduction of 30% by week 4 Date Initiated: 11/14/2022 Target Resolution Date: 01/30/2023 Goal Status: Active Interventions: Assess patient/caregiver ability to obtain necessary  supplies Assess patient/caregiver ability to perform ulcer/skin care regimen upon admission and as needed Assess ulceration(s) every visit Provide education on ulcer and skin care Notes: Electronic Signature(s) Signed: 01/02/2023 5:20:06 PM By: Donald Pulling RN, BSN Entered By: Donald Jones on 01/02/2023 05:27:49 -------------------------------------------------------------------------------- Pain Assessment Details Patient Name: Date of Service: Donald Jones Donald Jones Donald Jones. 01/02/2023 8:00 A M Medical Record Number: [id number] Patient Account Number: [id number] Date of Birth/Sex: Treating RN: April 30, 1945 (77 y.o. Donald Jones Primary Care Shelitha Magley: Donald Jones Other Clinician: Referring Jozlin Bently: Treating Ina Poupard/Extender: Donald Jones in Treatment: 7 Active Problems Location of Pain Severity and Description of Pain Patient Has Paino No Site Locations Pain Management and Medication Current Pain Management: Electronic Signature(s) Signed: 01/02/2023 5:20:06 PM By: Donald Pulling RN, BSN Entered By: Donald Jones on 01/02/2023 05:11:25 Donald Jones, Donald Jones ([id number]) [id number] Page 6 of 9 -------------------------------------------------------------------------------- Patient/Caregiver Education Details Patient Name: Date of Service: [name], [place] 9/17/2024andnbsp8:00 A M Medical Record Number: [id number] Patient Account Number: [id number] Date of Birth/Gender: Treating RN: Mar 12, 1946 (77 y.o. Donald Jones Primary Care Physician: Donald Jones Other Clinician: Referring Physician: Treating Physician/Extender: Donald Jones in Treatment: 7 Education Assessment Education Provided To: Patient Education Topics Provided Wound/Skin Impairment: Methods: Explain/Verbal Responses: State content correctly [employer]) Signed: 01/02/2023 5:20:06 PM By: Donald Pulling RN, BSN Entered By: Donald Jones on  01/02/2023 05:49:02 -------------------------------------------------------------------------------- Wound Assessment Details Patient Name: Date of Service: FO RA Donald Jones Donald Jones. 01/02/2023 8:00 A M Medical Record Number: [id number] Patient Account Number: [id number] Date of Birth/Sex: Treating RN: [birth year] (77 y.o. Donald Jones Primary Care Damia Bobrowski: Donald Jones Other Clinician: Referring Odester Nilson: Treating Felecia Stanfill/Extender: Donald Jones in Treatment: 7 Wound Status Wound Number: 5 Primary Venous Leg Ulcer Etiology: Wound Location: Left, Medial Lower Leg Secondary Diabetic Wound/Ulcer of the Lower Extremity Wounding Event: Blister Etiology: Date Acquired: 12/26/2022 Wound Open Weeks Of Treatment: 0 Status: Clustered Wound: No Comorbid Chronic sinus problems/congestion, Asthma, Sleep Apnea, History: Hypertension, Type II Diabetes, Osteoarthritis, Confinement Anxiety Photos Wound Measurements Donald Jones, Donald Jones ([id number]) Length: (cm) 0.5 Width: (cm) 0.4 Depth: (cm) 0.1 Area: (cm) 0.157 Volume: (cm) 0.016 [id number] Page 7 of 9 % Reduction in Area: % Reduction in Volume: Epithelialization: None Tunneling: No Undermining: No Wound Description Classification: Full Thickness Without Exposed Support Structures Exudate Amount: Medium Exudate Type: Serosanguineous Exudate Color: red, brown Foul Odor After Cleansing: No Slough/Fibrino Yes Wound Bed Granulation Amount: None Present (0%) Exposed Structure Necrotic Amount: Large (67-100%) Fascia Exposed: No Necrotic Quality: Eschar, Adherent Slough Fat Layer (Subcutaneous Tissue) Exposed: Yes Tendon Exposed: No Muscle Exposed: No Joint Exposed: No Bone Exposed: No Periwound Skin Texture Texture Color No Abnormalities Noted: No No Abnormalities Noted: No Moisture No Abnormalities Noted: No Treatment Notes Wound #5 (Lower Leg) Wound Laterality: Left,  Medial Cleanser Soap and Water Discharge Instruction: May shower and wash wound with dial antibacterial soap and water prior to dressing change. Vashe 5.8 (oz) Discharge Instruction: Cleanse the wound with Vashe prior to applying a clean dressing using gauze sponges, not tissue or cotton balls. Peri-Wound Care Triamcinolone 15 (g) Discharge Instruction: Use triamcinolone 15 (

## 2023-01-03 NOTE — Telephone Encounter (Signed)
I spoke w/ Pt earlier- informed it would probably require a PA.

## 2023-01-03 NOTE — Telephone Encounter (Signed)
Patient states that tirzepatide [facility]) 2.5 MG/0.5ML Pen is too expensive and will not be able to afford. He would like a call back with different options.

## 2023-01-05 ENCOUNTER — Other Ambulatory Visit: Payer: Self-pay | Admitting: Internal Medicine

## 2023-01-08 ENCOUNTER — Ambulatory Visit: Payer: Medicare Other | Admitting: Podiatry

## 2023-01-09 ENCOUNTER — Encounter (HOSPITAL_BASED_OUTPATIENT_CLINIC_OR_DEPARTMENT_OTHER): Payer: Medicare Other | Admitting: Internal Medicine

## 2023-01-09 DIAGNOSIS — L97811 Non-pressure chronic ulcer of other part of right lower leg limited to breakdown of skin: Secondary | ICD-10-CM | POA: Diagnosis not present

## 2023-01-09 DIAGNOSIS — E11622 Type 2 diabetes mellitus with other skin ulcer: Secondary | ICD-10-CM | POA: Diagnosis not present

## 2023-01-09 DIAGNOSIS — I872 Venous insufficiency (chronic) (peripheral): Secondary | ICD-10-CM | POA: Diagnosis not present

## 2023-01-09 DIAGNOSIS — I87313 Chronic venous hypertension (idiopathic) with ulcer of bilateral lower extremity: Secondary | ICD-10-CM | POA: Diagnosis not present

## 2023-01-09 DIAGNOSIS — I10 Essential (primary) hypertension: Secondary | ICD-10-CM | POA: Diagnosis not present

## 2023-01-09 DIAGNOSIS — L97821 Non-pressure chronic ulcer of other part of left lower leg limited to breakdown of skin: Secondary | ICD-10-CM | POA: Diagnosis not present

## 2023-01-09 DIAGNOSIS — G4733 Obstructive sleep apnea (adult) (pediatric): Secondary | ICD-10-CM | POA: Diagnosis not present

## 2023-01-10 ENCOUNTER — Other Ambulatory Visit (HOSPITAL_COMMUNITY): Payer: Self-pay

## 2023-01-10 ENCOUNTER — Other Ambulatory Visit: Payer: Self-pay | Admitting: Family

## 2023-01-10 MED ORDER — OZEMPIC (0.25 OR 0.5 MG/DOSE) 2 MG/3ML ~~LOC~~ SOPN: 0.2500 mg | PEN_INJECTOR | SUBCUTANEOUS | 1 refills | Status: DC

## 2023-01-10 NOTE — Telephone Encounter (Signed)
Please advise in PCP absence.

## 2023-01-10 NOTE — Telephone Encounter (Signed)
Medication does not require PA, however, due to coverage gap, pt cost is $268.80. If provider and pt are willing to switch to Ozempic, there are patient assistance programs that could provide Ozempic for free to pt, considering he has medicare. Please consider changing if appropriate. Thanks

## 2023-01-10 NOTE — Progress Notes (Signed)
cast cover and may then take shower. Edema Control - Lymphedema / SCD / Other Bilateral Lower Extremities Elevate legs to the level of the heart or above for 30 minutes daily and/or when sitting for 3-4 times a day throughout the day. Avoid standing for long periods of time. Exercise regularly - walking is good Compression stocking or Garment 30-40 mm/Hg pressure to: - Juxtalite HD bilateral lower legs. Wound Treatment Wound #5 - Lower Leg Wound Laterality: Left, Medial Cleanser: Soap and Water 1 x Per Week/15 Days Discharge Instructions: May shower and wash wound with dial antibacterial soap and water prior to dressing change. Cleanser: Vashe 5.8 (oz) 1 x Per Week/15 Days Discharge Instructions: Cleanse the wound with Vashe prior to applying a clean dressing using gauze sponges, not tissue or cotton balls. Peri-Wound Care: Triamcinolone 15 (g) 1 x Per Week/15 Days MATTHIEU, JAGGERS ([id number]) [id number] Page 4 of 8 Discharge Instructions: Use triamcinolone 15 (g) as directed Peri-Wound Care: Sween Lotion (Moisturizing lotion) 1 x Per Week/15 Days Discharge Instructions: Apply moisturizing lotion as directed Prim Dressing: Xeroform Occlusive Gauze Dressing, 4x4 in 1 x Per Week/15  Days ary Discharge Instructions: Apply to wound bed as instructed Secondary Dressing: Woven Gauze Sponge, Non-Sterile 4x4 in 1 x Per Week/15 Days Discharge Instructions: Apply over primary dressing as directed. Compression Wrap: Urgo K2 Lite, (equivalent to a 3 layer) two layer compression system, regular 1 x Per Week/15 Days Discharge Instructions: Apply Urgo K2 Lite as directed (alternative to 3 layer compression). Compression Stockings: Circaid Juxta Lite Compression Wrap (DME) Left Leg Compression Amount: 30-40 mmHG Discharge Instructions: Apply Circaid Juxta Lite Compression Wrap daily as instructed. Apply first thing in the morning, remove at night before bed. Wound #6 - Lower Leg Wound Laterality: Right, Anterior Cleanser: Soap and Water 1 x Per Week/15 Days Discharge Instructions: May shower and wash wound with dial antibacterial soap and water prior to dressing change. Cleanser: Vashe 5.8 (oz) 1 x Per Week/15 Days Discharge Instructions: Cleanse the wound with Vashe prior to applying a clean dressing using gauze sponges, not tissue or cotton balls. Peri-Wound Care: Triamcinolone 15 (g) 1 x Per Week/15 Days Discharge Instructions: Use triamcinolone 15 (g) as directed Peri-Wound Care: Sween Lotion (Moisturizing lotion) 1 x Per Week/15 Days Discharge Instructions: Apply moisturizing lotion as directed Prim Dressing: Xeroform Occlusive Gauze Dressing, 4x4 in 1 x Per Week/15 Days ary Discharge Instructions: Apply to wound bed as instructed Secondary Dressing: Woven Gauze Sponge, Non-Sterile 4x4 in 1 x Per Week/15 Days Discharge Instructions: Apply over primary dressing as directed. Compression Wrap: Urgo K2 Lite, (equivalent to a 3 layer) two layer compression system, regular 1 x Per Week/15 Days Discharge Instructions: Apply Urgo K2 Lite as directed (alternative to 3 layer compression). Compression Stockings: Circaid Juxta Lite Compression Wrap (DME) Right Leg Compression Amount:  30-40 mmHG Discharge Instructions: Apply Circaid Juxta Lite Compression Wrap daily as instructed. Apply first thing in the morning, remove at night before bed. Electronic Signature(s) Signed: 01/09/2023 4:52:49 PM By: Baltazar Najjar MD Signed: 01/09/2023 5:19:03 PM By: Shawn Stall RN, BSN Entered By: Shawn Stall on 01/09/2023 15:39:44 -------------------------------------------------------------------------------- Problem List Details Patient Name: Date of Service: Donald Jones, Donald Jones. 01/09/2023 2:30 PM Medical Record Number: [id number] Patient Account Number: [id number] Date of Birth/Sex: Treating RN: 02/21/1946 (77 y.o. Tammy Sours Primary Care Provider: [name] Other Clinician: Referring Provider: Treating Provider/Extender: [name] in Treatment: 8 Active Problems ICD-10 Encounter Code Description Active Date MDM  DONATHAN, TOMER ([id number]) [id number] Page 1 of 8 Visit Report for 01/09/2023 Debridement Details Patient Name: Date of Service: FO RA Jones, [place] 01/09/2023 2:30 PM Medical Record Number: [id number] Patient Account Number: [id number] Date of Birth/Sex: Treating RN: 1945/09/20 (77 y.o. Tammy Sours Primary Care Provider: [name] Other Clinician: Referring Provider: Treating Provider/Extender: [name] in Treatment: 8 Debridement Performed for Assessment: Wound #5 Left,Medial Lower Leg Performed By: Clinician Shawn Stall, RN Debridement Type: Chemical/Enzymatic/Mechanical Agent Used: gauze and wound cleanser Severity of Tissue Pre Debridement: Limited to breakdown of skin Level of Consciousness (Pre-procedure): Awake and Alert Pre-procedure Verification/Time Out No Taken: Percent of Wound Bed Debrided: Bleeding: None Response to Treatment: Procedure was tolerated well Level of Consciousness (Post- Awake and Alert procedure): Post Debridement Measurements of Total Wound Length: (cm) 0.2 Width: (cm) 0.2 Depth: (cm) 0.1 Volume: (cm) 0.003 Character of Wound/Ulcer Post Debridement: Improved Severity of Tissue Post Debridement: Limited to breakdown of skin Post Procedure Diagnosis Same as Pre-procedure Electronic Signature(s) Signed: 01/09/2023 4:52:49 PM By: Baltazar Najjar MD Signed: 01/09/2023 5:19:03 PM By: Shawn Stall RN, BSN Entered By: Shawn Stall on 01/09/2023 15:41:46 -------------------------------------------------------------------------------- Debridement Details Patient Name: Date of Service: FO RA Jones, Donald Jones. 01/09/2023 2:30 PM Medical Record Number: [id number] Patient Account Number: [id number] Date of Birth/Sex: Treating RN: 1945-10-05 (77 y.o. Tammy Sours Primary Care Provider: [name] Other Clinician: Referring Provider: Treating Provider/Extender: [name] in  Treatment: 8 Debridement Performed for Assessment: Wound #6 Right,Anterior Lower Leg Performed By: Clinician Shawn Stall, RN Debridement Type: Chemical/Enzymatic/Mechanical Agent Used: gauze and wound cleanser Severity of Tissue Pre Debridement: Limited to breakdown of skin Level of Consciousness (Pre-procedure): Awake and Alert Pre-procedure Verification/Time Out No Taken: Percent of Wound Bed Debrided: Bleeding: None Response to Treatment: Procedure was tolerated well Level of Consciousness Arlie SolomonsKAAN, CASINI ([id number]) [id number] Page 2 of 8 Level of Consciousness (Post- Awake and Alert procedure): Post Debridement Measurements of Total Wound Length: (cm) 2 Width: (cm) 2.2 Depth: (cm) 0.1 Volume: (cm) 0.346 Character of Wound/Ulcer Post Debridement: Improved Severity of Tissue Post Debridement: Limited to breakdown of skin Post Procedure Diagnosis Same as Pre-procedure Electronic Signature(s) Signed: 01/09/2023 4:52:49 PM By: Baltazar Najjar MD Signed: 01/09/2023 5:19:03 PM By: Shawn Stall RN, BSN Entered By: Shawn Stall on 01/09/2023 15:42:04 -------------------------------------------------------------------------------- HPI Details Patient Name: Date of Service: FO RA Jones, Donald Jones. 01/09/2023 2:30 PM Medical Record Number: [id number] Patient Account Number: [id number] Date of Birth/Sex: Treating RN: 04/07/1946 (77 y.o. Tammy Sours Primary Care Provider: [name] Other Clinician: Referring Provider: Treating Provider/Extender: [name], [name] in Treatment: 8 History of Present Illness HPI Description: 11/14/2022 ABI Left: 0.96 and ABI Right: 1.03 11/14/2022 Mr. Jary Hammans is a 77 year old male with a past medical history of controlled type 2 diabetes on oral agents, hypertension, OSA and venous insufficiency that presents to the clinic for a 1-30-month history of increased swelling to the lower extremities  with wounds. He visited urgent care on 7/20 for this issue. He was started on doxycycline for potential cellulitis and and is currently taking the medication. He does not have compression stockings. He states that the wounds on his legs happened spontaneously. He has been using soap and water and keeping the areas covered. He currently denies signs of infection. 8/6; patient presents for follow-up. We have been using antibiotic ointment with silver alginate under Kerlix/Coban. He has done well with this and most  16:08:27 -------------------------------------------------------------------------------- SuperBill Details Patient Name: Date of Service: FO RA Jones, [place] 01/09/2023 Medical Record Number: [id number] Patient Account Number: [id number] Date of Birth/Sex: Treating RN: June 15, 1945 (77 y.o. Tammy Sours Primary Care Provider: [name] Other Clinician: Referring Provider: Treating Provider/Extender: [name] in Treatment: 8 Diagnosis Coding ICD-10 Codes Code Description [phone] Chronic venous hypertension (idiopathic) with ulcer of bilateral lower extremity L97.811 Non-pressure chronic ulcer of other part of right lower leg limited to breakdown of skin L97.821 Non-pressure chronic ulcer of other part of left lower leg limited to breakdown of skin E11.622 Type 2 diabetes mellitus with other skin ulcer Facility Procedures : TYWAYNE, SHALLENBERGER ([id number]) [id number] Page 8 of 8 CPT4 Description Modifier Quantity Code [id number] [phone] BILATERAL: Application of multi-layer  venous compression system; leg (below knee), including ankle and 1 foot. Physician Procedures : CPT4 Code Description Modifier [phone] [id number] - WC PHYS LEVEL 3 - EST PT ICD-10 Diagnosis Description I87.313 Chronic venous hypertension (idiopathic) with ulcer of bilateral lower extremity L97.811 Non-pressure chronic ulcer of other part of right  lower leg limited to breakdown of skin L97.821 Non-pressure chronic ulcer of other part of left lower leg limited to breakdown of skin Quantity: 1 Electronic Signature(s) Signed: 01/09/2023 4:52:49 PM By: Baltazar Najjar MD Entered By: Baltazar Najjar on 01/09/2023 16:08:57  Diagnosis I87.313 Chronic venous hypertension (idiopathic) with ulcer of bilateral lower extremity 11/14/2022 No Yes AYODELE, MARKS ([id number]) [phone].pdf Page 5 of 8 L97.811 Non-pressure chronic ulcer of other part of right lower leg limited to breakdown 11/14/2022 No Yes of skin L97.821 Non-pressure chronic ulcer of other part of left lower leg limited to breakdown 11/14/2022 No Yes of skin E11.622 Type 2 diabetes mellitus with other skin ulcer 11/14/2022 No Yes Inactive Problems Resolved Problems Electronic Signature(s) Signed: 01/09/2023 4:52:49 PM By: Baltazar Najjar MD Entered By: Baltazar Najjar on 01/09/2023 16:02:34 -------------------------------------------------------------------------------- Progress Note Details Patient Name: Date of Service: Donald Jones, Donald Jones. 01/09/2023 2:30 PM Medical Record Number: [id number] Patient Account Number: [id number] Date of Birth/Sex: Treating RN: 20-Jun-1945 (77 y.o. Tammy Sours Primary Care Provider: [name] Other Clinician: Referring  Provider: Treating Provider/Extender: [name], [name] in Treatment: 8 Subjective History of Present Illness (HPI) 11/14/2022 ABI Left: 0.96 and ABI Right: 1.03 11/14/2022 Mr. Taishi Slusser is a 77 year old male with a past medical history of controlled type 2 diabetes on oral agents, hypertension, OSA and venous insufficiency that presents to the clinic for a 1-53-month history of increased swelling to the lower extremities with wounds. He visited urgent care on 7/20 for this issue. He was started on doxycycline for potential cellulitis and and is currently taking the medication. He does not have compression stockings. He states that the wounds on his legs happened spontaneously. He has been using soap and water and keeping the areas covered. He currently denies signs of infection. 8/6; patient presents for follow-up. We have been using antibiotic ointment with silver alginate under Kerlix/Coban. He has done well with this and most of his wounds have epithelialized. He has a few scattered open areas. He has his compression stockings with him today. He has no issues or complaints. 8/13; patient presents for follow-up. Last visit we used Xeroform under Kerlix/Coban to lower extremities bilaterally. His wounds of healed. He has his compression stockings with him today. READMISSION 01/02/2023. Patient is a 77 year old man who is a diabetic on oral agents however he has chronic venous insufficiency with a long history of wounds that usually heal. He was in the clinic from 11/14/2022 through 11/28/2022. He had wounds on his bilateral lower extremities we use Xeroform kerlix Coban and he had healed when he was discharged here on 11/28/2022. I believe he transitioned into 20/30 below-knee stockings from elastic therapy. It sounds as though he was compliant however 2 days ago noted a blister on the right anterior lower leg that ruptured when he was putting on his stockings. He has not worn his  stockings since he has developed an area on the left anterior mid tibia as well. Somewhat painful but not particularly so. 9/24; patient's wounds are certainly smaller this week. I thought they might be healed. Still a small open area on the right lateral lower right anterior lateral lower leg and is even smaller area medially all of this is just about closed. [address] YOSHIKI, RAICHE ([id number]) [id number] Page 6 of 8 Constitutional Sitting or standing Blood Pressure is within target range for patient.. Pulse regular and within target range for patient.Marland Kitchen Respirations regular, non-labored and within target range.. Temperature is normal and within the target range for the patient.Marland Kitchen Appears in no distress. Vitals Time Taken: 3:04 PM, Height: 65 in, Weight: 240 lbs, BMI: 39.9, Temperature: 98.1 F, Pulse: 56 bpm, Respiratory Rate: 18 breaths/min, Blood Pressure: 122/74 mmHg, Capillary Blood Glucose: 146  DONATHAN, TOMER ([id number]) [id number] Page 1 of 8 Visit Report for 01/09/2023 Debridement Details Patient Name: Date of Service: FO RA Jones, [place] 01/09/2023 2:30 PM Medical Record Number: [id number] Patient Account Number: [id number] Date of Birth/Sex: Treating RN: 1945/09/20 (77 y.o. Tammy Sours Primary Care Provider: [name] Other Clinician: Referring Provider: Treating Provider/Extender: [name] in Treatment: 8 Debridement Performed for Assessment: Wound #5 Left,Medial Lower Leg Performed By: Clinician Shawn Stall, RN Debridement Type: Chemical/Enzymatic/Mechanical Agent Used: gauze and wound cleanser Severity of Tissue Pre Debridement: Limited to breakdown of skin Level of Consciousness (Pre-procedure): Awake and Alert Pre-procedure Verification/Time Out No Taken: Percent of Wound Bed Debrided: Bleeding: None Response to Treatment: Procedure was tolerated well Level of Consciousness (Post- Awake and Alert procedure): Post Debridement Measurements of Total Wound Length: (cm) 0.2 Width: (cm) 0.2 Depth: (cm) 0.1 Volume: (cm) 0.003 Character of Wound/Ulcer Post Debridement: Improved Severity of Tissue Post Debridement: Limited to breakdown of skin Post Procedure Diagnosis Same as Pre-procedure Electronic Signature(s) Signed: 01/09/2023 4:52:49 PM By: Baltazar Najjar MD Signed: 01/09/2023 5:19:03 PM By: Shawn Stall RN, BSN Entered By: Shawn Stall on 01/09/2023 15:41:46 -------------------------------------------------------------------------------- Debridement Details Patient Name: Date of Service: FO RA Jones, Donald Jones. 01/09/2023 2:30 PM Medical Record Number: [id number] Patient Account Number: [id number] Date of Birth/Sex: Treating RN: 1945-10-05 (77 y.o. Tammy Sours Primary Care Provider: [name] Other Clinician: Referring Provider: Treating Provider/Extender: [name] in  Treatment: 8 Debridement Performed for Assessment: Wound #6 Right,Anterior Lower Leg Performed By: Clinician Shawn Stall, RN Debridement Type: Chemical/Enzymatic/Mechanical Agent Used: gauze and wound cleanser Severity of Tissue Pre Debridement: Limited to breakdown of skin Level of Consciousness (Pre-procedure): Awake and Alert Pre-procedure Verification/Time Out No Taken: Percent of Wound Bed Debrided: Bleeding: None Response to Treatment: Procedure was tolerated well Level of Consciousness Arlie SolomonsKAAN, CASINI ([id number]) [id number] Page 2 of 8 Level of Consciousness (Post- Awake and Alert procedure): Post Debridement Measurements of Total Wound Length: (cm) 2 Width: (cm) 2.2 Depth: (cm) 0.1 Volume: (cm) 0.346 Character of Wound/Ulcer Post Debridement: Improved Severity of Tissue Post Debridement: Limited to breakdown of skin Post Procedure Diagnosis Same as Pre-procedure Electronic Signature(s) Signed: 01/09/2023 4:52:49 PM By: Baltazar Najjar MD Signed: 01/09/2023 5:19:03 PM By: Shawn Stall RN, BSN Entered By: Shawn Stall on 01/09/2023 15:42:04 -------------------------------------------------------------------------------- HPI Details Patient Name: Date of Service: FO RA Jones, Donald Jones. 01/09/2023 2:30 PM Medical Record Number: [id number] Patient Account Number: [id number] Date of Birth/Sex: Treating RN: 04/07/1946 (77 y.o. Tammy Sours Primary Care Provider: [name] Other Clinician: Referring Provider: Treating Provider/Extender: [name], [name] in Treatment: 8 History of Present Illness HPI Description: 11/14/2022 ABI Left: 0.96 and ABI Right: 1.03 11/14/2022 Mr. Jary Hammans is a 77 year old male with a past medical history of controlled type 2 diabetes on oral agents, hypertension, OSA and venous insufficiency that presents to the clinic for a 1-30-month history of increased swelling to the lower extremities  with wounds. He visited urgent care on 7/20 for this issue. He was started on doxycycline for potential cellulitis and and is currently taking the medication. He does not have compression stockings. He states that the wounds on his legs happened spontaneously. He has been using soap and water and keeping the areas covered. He currently denies signs of infection. 8/6; patient presents for follow-up. We have been using antibiotic ointment with silver alginate under Kerlix/Coban. He has done well with this and most  16:08:27 -------------------------------------------------------------------------------- SuperBill Details Patient Name: Date of Service: FO RA Jones, [place] 01/09/2023 Medical Record Number: [id number] Patient Account Number: [id number] Date of Birth/Sex: Treating RN: June 15, 1945 (77 y.o. Tammy Sours Primary Care Provider: [name] Other Clinician: Referring Provider: Treating Provider/Extender: [name] in Treatment: 8 Diagnosis Coding ICD-10 Codes Code Description [phone] Chronic venous hypertension (idiopathic) with ulcer of bilateral lower extremity L97.811 Non-pressure chronic ulcer of other part of right lower leg limited to breakdown of skin L97.821 Non-pressure chronic ulcer of other part of left lower leg limited to breakdown of skin E11.622 Type 2 diabetes mellitus with other skin ulcer Facility Procedures : TYWAYNE, SHALLENBERGER ([id number]) [id number] Page 8 of 8 CPT4 Description Modifier Quantity Code [id number] [phone] BILATERAL: Application of multi-layer  venous compression system; leg (below knee), including ankle and 1 foot. Physician Procedures : CPT4 Code Description Modifier [phone] [id number] - WC PHYS LEVEL 3 - EST PT ICD-10 Diagnosis Description I87.313 Chronic venous hypertension (idiopathic) with ulcer of bilateral lower extremity L97.811 Non-pressure chronic ulcer of other part of right  lower leg limited to breakdown of skin L97.821 Non-pressure chronic ulcer of other part of left lower leg limited to breakdown of skin Quantity: 1 Electronic Signature(s) Signed: 01/09/2023 4:52:49 PM By: Baltazar Najjar MD Entered By: Baltazar Najjar on 01/09/2023 16:08:57  16:08:27 -------------------------------------------------------------------------------- SuperBill Details Patient Name: Date of Service: FO RA Jones, [place] 01/09/2023 Medical Record Number: [id number] Patient Account Number: [id number] Date of Birth/Sex: Treating RN: June 15, 1945 (77 y.o. Tammy Sours Primary Care Provider: [name] Other Clinician: Referring Provider: Treating Provider/Extender: [name] in Treatment: 8 Diagnosis Coding ICD-10 Codes Code Description [phone] Chronic venous hypertension (idiopathic) with ulcer of bilateral lower extremity L97.811 Non-pressure chronic ulcer of other part of right lower leg limited to breakdown of skin L97.821 Non-pressure chronic ulcer of other part of left lower leg limited to breakdown of skin E11.622 Type 2 diabetes mellitus with other skin ulcer Facility Procedures : TYWAYNE, SHALLENBERGER ([id number]) [id number] Page 8 of 8 CPT4 Description Modifier Quantity Code [id number] [phone] BILATERAL: Application of multi-layer  venous compression system; leg (below knee), including ankle and 1 foot. Physician Procedures : CPT4 Code Description Modifier [phone] [id number] - WC PHYS LEVEL 3 - EST PT ICD-10 Diagnosis Description I87.313 Chronic venous hypertension (idiopathic) with ulcer of bilateral lower extremity L97.811 Non-pressure chronic ulcer of other part of right  lower leg limited to breakdown of skin L97.821 Non-pressure chronic ulcer of other part of left lower leg limited to breakdown of skin Quantity: 1 Electronic Signature(s) Signed: 01/09/2023 4:52:49 PM By: Baltazar Najjar MD Entered By: Baltazar Najjar on 01/09/2023 16:08:57

## 2023-01-10 NOTE — Progress Notes (Signed)
Wound Assessment Details Patient Name: Date of Service: [name], [place] 01/09/2023 2:30 PM Medical Record Number: [id number] Patient Account Number: [id number] Date of Birth/Sex: Treating RN: [birth year] (77 y.o. Tammy Sours Primary Care Alayna Mabe: [name] Other Clinician: Referring Rosa Wyly: Treating Annalynne Ibanez/Extender: Rexene Jones in Treatment: 8 Wound Status Wound Number: 6 Primary Venous Leg Ulcer Etiology: Wound Location: Right, Anterior Lower Leg Secondary Diabetic Wound/Ulcer of the Lower Extremity Wounding Event: Blister Etiology: Date Acquired: 12/19/2022 Wound Open Weeks Of Treatment: 1 Status: Clustered Wound: Yes Comorbid Chronic sinus problems/congestion, Asthma, Sleep Apnea, History: Hypertension, Type II Diabetes, Osteoarthritis, Confinement Anxiety Photos Wound Measurements Length: (cm) 2 Width: (cm) 2.2 Depth: (cm) 0.1 Donald Jones, Donald Jones ([id number]) Clustered Quantity: 2 Area: (cm) 3.456 Volume: (cm) 0.346 %  Reduction in Area: 26.7% % Reduction in Volume: 26.5% Epithelialization: None [id number] Page 10 of 11 Wound Description Classification: Full Thickness Without Exposed Sup Exudate Amount: Medium Exudate Type: Serosanguineous Exudate Color: red, brown port Structures Foul Odor After Cleansing: No Slough/Fibrino Yes Wound Bed Granulation Amount: Small (1-33%) Exposed Structure Granulation Quality: Red Fascia Exposed: No Necrotic Amount: Large (67-100%) Fat Layer (Subcutaneous Tissue) Exposed: Yes Necrotic Quality: Eschar, Adherent Slough Tendon Exposed: No Muscle Exposed: No Joint Exposed: No Bone Exposed: No Periwound Skin Texture Texture Color No Abnormalities Noted: No No Abnormalities Noted: No Moisture No Abnormalities Noted: No Treatment Notes Wound #6 (Lower Leg) Wound Laterality: Right, Anterior Cleanser Soap and Water Discharge Instruction: May shower and wash wound with dial antibacterial soap and water prior to dressing change. Vashe 5.8 (oz) Discharge Instruction: Cleanse the wound with Vashe prior to applying a clean dressing using gauze sponges, not tissue or cotton balls. Peri-Wound Care Triamcinolone 15 (g) Discharge Instruction: Use triamcinolone 15 (g) as directed Sween Lotion (Moisturizing lotion) Discharge Instruction: Apply moisturizing lotion as directed Topical Primary Dressing Xeroform Occlusive Gauze Dressing, 4x4 in Discharge Instruction: Apply to wound bed as instructed Secondary Dressing Woven Gauze Sponge, Non-Sterile 4x4 in Discharge Instruction: Apply over primary dressing as directed. Secured With Compression Wrap Urgo K2 Lite, (equivalent to a 3 layer) two layer compression system, regular Discharge Instruction: Apply Urgo K2 Lite as directed (alternative to 3 layer compression). Compression Stockings Circaid Juxta Lite Compression Wrap Quantity: 1 Right Leg Compression Amount: 30-40 mmHg Discharge  Instruction: Apply Circaid Juxta Lite Compression Wrap daily as instructed. Apply first thing in the morning, remove at night before bed. Add-Ons Electronic Signature(s) Signed: 01/09/2023 5:19:03 PM By: Shawn Stall RN, BSN Signed: 01/10/2023 4:28:55 PM By: Thayer Dallas Entered By: Thayer Dallas on 01/09/2023 15:26:47 Donald Jones, Donald Jones ([id number]) [id number] Page 11 of 11 -------------------------------------------------------------------------------- Vitals Details Patient Name: Date of Service: FO RA Donald Jones, [place] 01/09/2023 2:30 PM Medical Record Number: [id number] Patient Account Number: [id number] Date of Birth/Sex: Treating RN: 10-18-1945 (77 y.o. Tammy Sours Primary Care Da Authement: [name] Other Clinician: Haywood Pao Referring Kaimen Peine: Treating Vayden Weinand/Extender: Rexene Jones in Treatment: 8 Vital Signs Time Taken: 15:04 Temperature (F): 98.1 Height (in): 65 Pulse (bpm): 56 Weight (lbs): 240 Respiratory Rate (breaths/min): 18 Body Mass Index (BMI): 39.9 Blood Pressure (mmHg): 122/74 Capillary Blood Glucose (mg/dl): 676 Reference Range: 80 - 120 mg / dl Electronic Signature(s) Signed: 01/09/2023 6:37:51 PM By: Haywood Pao CHT EMT BS , , Entered By: Haywood Pao on 01/09/2023 15:05:26  Wound Assessment Details Patient Name: Date of Service: [name], [place] 01/09/2023 2:30 PM Medical Record Number: [id number] Patient Account Number: [id number] Date of Birth/Sex: Treating RN: [birth year] (77 y.o. Tammy Sours Primary Care Alayna Mabe: [name] Other Clinician: Referring Rosa Wyly: Treating Annalynne Ibanez/Extender: Rexene Jones in Treatment: 8 Wound Status Wound Number: 6 Primary Venous Leg Ulcer Etiology: Wound Location: Right, Anterior Lower Leg Secondary Diabetic Wound/Ulcer of the Lower Extremity Wounding Event: Blister Etiology: Date Acquired: 12/19/2022 Wound Open Weeks Of Treatment: 1 Status: Clustered Wound: Yes Comorbid Chronic sinus problems/congestion, Asthma, Sleep Apnea, History: Hypertension, Type II Diabetes, Osteoarthritis, Confinement Anxiety Photos Wound Measurements Length: (cm) 2 Width: (cm) 2.2 Depth: (cm) 0.1 Donald Jones, Donald Jones ([id number]) Clustered Quantity: 2 Area: (cm) 3.456 Volume: (cm) 0.346 %  Reduction in Area: 26.7% % Reduction in Volume: 26.5% Epithelialization: None [id number] Page 10 of 11 Wound Description Classification: Full Thickness Without Exposed Sup Exudate Amount: Medium Exudate Type: Serosanguineous Exudate Color: red, brown port Structures Foul Odor After Cleansing: No Slough/Fibrino Yes Wound Bed Granulation Amount: Small (1-33%) Exposed Structure Granulation Quality: Red Fascia Exposed: No Necrotic Amount: Large (67-100%) Fat Layer (Subcutaneous Tissue) Exposed: Yes Necrotic Quality: Eschar, Adherent Slough Tendon Exposed: No Muscle Exposed: No Joint Exposed: No Bone Exposed: No Periwound Skin Texture Texture Color No Abnormalities Noted: No No Abnormalities Noted: No Moisture No Abnormalities Noted: No Treatment Notes Wound #6 (Lower Leg) Wound Laterality: Right, Anterior Cleanser Soap and Water Discharge Instruction: May shower and wash wound with dial antibacterial soap and water prior to dressing change. Vashe 5.8 (oz) Discharge Instruction: Cleanse the wound with Vashe prior to applying a clean dressing using gauze sponges, not tissue or cotton balls. Peri-Wound Care Triamcinolone 15 (g) Discharge Instruction: Use triamcinolone 15 (g) as directed Sween Lotion (Moisturizing lotion) Discharge Instruction: Apply moisturizing lotion as directed Topical Primary Dressing Xeroform Occlusive Gauze Dressing, 4x4 in Discharge Instruction: Apply to wound bed as instructed Secondary Dressing Woven Gauze Sponge, Non-Sterile 4x4 in Discharge Instruction: Apply over primary dressing as directed. Secured With Compression Wrap Urgo K2 Lite, (equivalent to a 3 layer) two layer compression system, regular Discharge Instruction: Apply Urgo K2 Lite as directed (alternative to 3 layer compression). Compression Stockings Circaid Juxta Lite Compression Wrap Quantity: 1 Right Leg Compression Amount: 30-40 mmHg Discharge  Instruction: Apply Circaid Juxta Lite Compression Wrap daily as instructed. Apply first thing in the morning, remove at night before bed. Add-Ons Electronic Signature(s) Signed: 01/09/2023 5:19:03 PM By: Shawn Stall RN, BSN Signed: 01/10/2023 4:28:55 PM By: Thayer Dallas Entered By: Thayer Dallas on 01/09/2023 15:26:47 Donald Jones, Donald Jones ([id number]) [id number] Page 11 of 11 -------------------------------------------------------------------------------- Vitals Details Patient Name: Date of Service: FO RA Donald Jones, [place] 01/09/2023 2:30 PM Medical Record Number: [id number] Patient Account Number: [id number] Date of Birth/Sex: Treating RN: 10-18-1945 (77 y.o. Tammy Sours Primary Care Da Authement: [name] Other Clinician: Haywood Pao Referring Kaimen Peine: Treating Vayden Weinand/Extender: Rexene Jones in Treatment: 8 Vital Signs Time Taken: 15:04 Temperature (F): 98.1 Height (in): 65 Pulse (bpm): 56 Weight (lbs): 240 Respiratory Rate (breaths/min): 18 Body Mass Index (BMI): 39.9 Blood Pressure (mmHg): 122/74 Capillary Blood Glucose (mg/dl): 676 Reference Range: 80 - 120 mg / dl Electronic Signature(s) Signed: 01/09/2023 6:37:51 PM By: Haywood Pao CHT EMT BS , , Entered By: Haywood Pao on 01/09/2023 15:05:26  Pain Severity and Description of Pain Patient Has Paino No Site Locations [place], [place] ([id number]) [id number] Page 7 of 11 Pain Management and Medication Current Pain Management: Electronic Signature(s) Signed: 01/09/2023 5:19:03 PM By: Shawn Stall RN, BSN Signed: 01/10/2023 4:28:55 PM By: Thayer Dallas Entered By: Thayer Dallas on 01/09/2023 15:21:00 -------------------------------------------------------------------------------- Patient/Caregiver Education Details Patient Name: Date of Service: Donald Jones Donald Jones, Donald Jones 9/24/2024andnbsp2:30 PM Medical Record Number: [id number] Patient Account Number: [id number] Date of Birth/Gender: Treating RN: 1946/03/04 (77 y.o. Tammy Sours Primary Care Physician: [name] Other Clinician: Referring Physician: Treating Physician/Extender: Rexene Jones in Treatment: 8 Education Assessment Education Provided To: Patient Education Topics Provided Wound/Skin Impairment: Handouts: Caring for Your Ulcer Methods: Explain/Verbal Responses: Reinforcements needed Electronic Signature(s) Signed: 01/09/2023 5:19:03 PM By: Shawn Stall RN, BSN Entered By: Shawn Stall on 01/09/2023 15:15:26 -------------------------------------------------------------------------------- Wound Assessment Details Patient Name: Date of Service: FO RA Donald Jones, Donald BERT Jones. 01/09/2023 2:30 PM Medical Record Number: [id number] Patient Account Number: [id number] Date of Birth/Sex: Treating RN: [birth year] (77 y.o. Donald Jones, Donald Jones ([id number]) [id number] Page 8 of 11 Primary Care Yoltzin Barg: [name] Other Clinician: Referring Everli Rother: Treating Brandy Kabat/Extender: [name], [name] in Treatment: 8 Wound Status Wound Number: 5 Primary Venous Leg  Ulcer Etiology: Wound Location: Left, Medial Lower Leg Secondary Diabetic Wound/Ulcer of the Lower Extremity Wounding Event: Blister Etiology: Date Acquired: 12/26/2022 Wound Open Weeks Of Treatment: 1 Status: Clustered Wound: No Comorbid Chronic sinus problems/congestion, Asthma, Sleep Apnea, History: Hypertension, Type II Diabetes, Osteoarthritis, Confinement Anxiety Photos Wound Measurements Length: (cm) 0.2 Width: (cm) 0.2 Depth: (cm) 0.1 Area: (cm) 0.031 Volume: (cm) 0.003 % Reduction in Area: 80.3% % Reduction in Volume: 81.3% Epithelialization: None Tunneling: No Undermining: No Wound Description Classification: Full Thickness Without Exposed Support Structures Exudate Amount: Medium Exudate Type: Serosanguineous Exudate Color: red, brown Foul Odor After Cleansing: No Slough/Fibrino Yes Wound Bed Granulation Amount: None Present (0%) Exposed Structure Necrotic Amount: Large (67-100%) Fascia Exposed: No Necrotic Quality: Eschar, Adherent Slough Fat Layer (Subcutaneous Tissue) Exposed: Yes Tendon Exposed: No Muscle Exposed: No Joint Exposed: No Bone Exposed: No Periwound Skin Texture Texture Color No Abnormalities Noted: No No Abnormalities Noted: No Moisture No Abnormalities Noted: No Treatment Notes Wound #5 (Lower Leg) Wound Laterality: Left, Medial Cleanser Soap and Water Discharge Instruction: May shower and wash wound with dial antibacterial soap and water prior to dressing change. Vashe 5.8 (oz) Discharge Instruction: Cleanse the wound with Vashe prior to applying a clean dressing using gauze sponges, not tissue or cotton balls. Peri-Wound Care Triamcinolone 15 (g) Discharge Instruction: Use triamcinolone 15 (g) as directed Sween Lotion (Moisturizing lotion) Discharge Instruction: Apply moisturizing lotion as directed Donald Jones, Donald Jones ([id number]) [phone].pdf Page 9 of 11 Topical Primary Dressing Xeroform Occlusive  Gauze Dressing, 4x4 in Discharge Instruction: Apply to wound bed as instructed Secondary Dressing Woven Gauze Sponge, Non-Sterile 4x4 in Discharge Instruction: Apply over primary dressing as directed. Secured With Compression Wrap Urgo K2 Lite, (equivalent to a 3 layer) two layer compression system, regular Discharge Instruction: Apply Urgo K2 Lite as directed (alternative to 3 layer compression). Compression Stockings Circaid Juxta Lite Compression Wrap Quantity: 1 Left Leg Compression Amount: 30-40 mmHg Discharge Instruction: Apply Circaid Juxta Lite Compression Wrap daily as instructed. Apply first thing in the morning, remove at night before bed. Add-Ons Electronic Signature(s) Signed: 01/09/2023 5:19:03 PM By: Shawn Stall RN, BSN Signed: 01/10/2023 4:28:55 PM By: Thayer Dallas Entered By: Thayer Dallas on 01/09/2023 15:26:20 --------------------------------------------------------------------------------  Donald Jones, Donald Jones ([id number]) [id number] Page 1 of 11 Visit Report for 01/09/2023 Arrival Information Details Patient Name: Date of Service: [name], [place] 01/09/2023 2:30 PM Medical Record Number: [id number] Patient Account Number: [id number] Date of Birth/Sex: Treating RN: 10-27-1945 (77 y.o. Tammy Sours Primary Care Essex Perry: [name] Other Clinician: Referring Dontea Corlew: Treating Annalynne Ibanez/Extender: Rexene Jones in Treatment: 8 Visit Information History Since Last Visit All ordered tests and consults were completed: Yes Patient Arrived: Ambulatory Added or deleted any medications: No Arrival Time: 14:06 Any new allergies or adverse reactions: No Accompanied By: self Had a fall or experienced change in No Transfer Assistance: None activities of daily living that may affect Patient Identification Verified: Yes risk of falls: Secondary Verification Process Completed: Yes Signs or symptoms of abuse/neglect since last visito No Patient Has Alerts: Yes Hospitalized since last visit: No Patient Alerts: Patient on Blood Thinner Implantable device outside of the clinic excluding No cellular tissue based products placed in the center since last visit: Pain Present Now: No Electronic Signature(s) Signed: 01/09/2023 6:37:51 PM By: Haywood Pao CHT EMT BS , , Entered By: Haywood Pao on 01/09/2023 15:04:28 -------------------------------------------------------------------------------- Compression Therapy Details Patient Name: Date of Service: Donald Jones Donald Jones, Donald BERT Jones. 01/09/2023 2:30 PM Medical Record Number: [id number] Patient Account Number: [id number] Date of Birth/Sex: Treating RN: [birth year] (77 y.o. Tammy Sours Primary Care Roshaun Pound: [name] Other Clinician: Referring Amadou Katzenstein: Treating Mickal Meno/Extender: Rexene Jones in Treatment: 8 Compression Therapy Performed for Wound Assessment: Wound  #6 Right,Anterior Lower Leg Performed By: Clinician Shawn Stall, RN Compression Type: Double Layer Post Procedure Diagnosis Same as Pre-procedure Electronic Signature(s) Signed: 01/09/2023 5:19:03 PM By: Shawn Stall RN, BSN Entered By: Shawn Stall on 01/09/2023 15:35:23 [name] ([id number]) [id number].pdf Page 2 of 11 -------------------------------------------------------------------------------- Compression Therapy Details Patient Name: Date of Service: FO RA Donald Jones, [place] 01/09/2023 2:30 PM Medical Record Number: [id number] Patient Account Number: [id number] Date of Birth/Sex: Treating RN: [birth year] (77 y.o. Tammy Sours Primary Care Jessee Mezera: [name] Other Clinician: Referring Tamy Accardo: Treating Cable Fearn/Extender: Rexene Jones in Treatment: 8 Compression Therapy Performed for Wound Assessment: Wound #5 Left,Medial Lower Leg Performed By: Clinician Shawn Stall, RN Compression Type: Double Layer Post Procedure Diagnosis Same as Pre-procedure Electronic Signature(s) Signed: 01/09/2023 5:19:03 PM By: Shawn Stall RN, BSN Entered By: Shawn Stall on 01/09/2023 15:35:23 -------------------------------------------------------------------------------- Encounter Discharge Information Details Patient Name: Date of Service: FO RA Donald Jones, Donald BERT Jones. 01/09/2023 2:30 PM Medical Record Number: [id number] Patient Account Number: [id number] Date of Birth/Sex: Treating RN: 1945/08/20 (77 y.o. Tammy Sours Primary Care Shristi Scheib: [name] Other Clinician: Referring Krystin Keeven: Treating Kaytelynn Scripter/Extender: Rexene Jones in Treatment: 8 Encounter Discharge Information Items Discharge Condition: Stable Ambulatory Status: Ambulatory Discharge Destination: Home Transportation: Private Auto Accompanied By: self Schedule Follow-up Appointment: Yes Clinical Summary of Care: Electronic Signature(s) Signed: 01/09/2023  5:19:03 PM By: Shawn Stall RN, BSN Entered By: Shawn Stall on 01/09/2023 15:40:29 -------------------------------------------------------------------------------- Lower Extremity Assessment Details Patient Name: Date of Service: FO RA Donald Jones, Donald BERT Jones. 01/09/2023 2:30 PM Medical Record Number: [id number] Patient Account Number: [id number] Date of Birth/Sex: Treating RN: September 16, 1945 (77 y.o. Tammy Sours Primary Care Stpehen Petitjean: [name] Other Clinician: Referring Genae Strine: Treating Kaleeya Hancock/Extender: [name], [name] in Treatment: 8 Edema Assessment Assessed: [Left: No] [Right: No] Edema: [Left: Yes] [Right: Yes] Donald Jones, Donald Jones ([id number]) [id number] Page 3 of 11 Left: Right: Point of  Pain Severity and Description of Pain Patient Has Paino No Site Locations [place], [place] ([id number]) [id number] Page 7 of 11 Pain Management and Medication Current Pain Management: Electronic Signature(s) Signed: 01/09/2023 5:19:03 PM By: Shawn Stall RN, BSN Signed: 01/10/2023 4:28:55 PM By: Thayer Dallas Entered By: Thayer Dallas on 01/09/2023 15:21:00 -------------------------------------------------------------------------------- Patient/Caregiver Education Details Patient Name: Date of Service: Donald Jones Donald Jones, Donald Jones 9/24/2024andnbsp2:30 PM Medical Record Number: [id number] Patient Account Number: [id number] Date of Birth/Gender: Treating RN: 1946/03/04 (77 y.o. Tammy Sours Primary Care Physician: [name] Other Clinician: Referring Physician: Treating Physician/Extender: Rexene Jones in Treatment: 8 Education Assessment Education Provided To: Patient Education Topics Provided Wound/Skin Impairment: Handouts: Caring for Your Ulcer Methods: Explain/Verbal Responses: Reinforcements needed Electronic Signature(s) Signed: 01/09/2023 5:19:03 PM By: Shawn Stall RN, BSN Entered By: Shawn Stall on 01/09/2023 15:15:26 -------------------------------------------------------------------------------- Wound Assessment Details Patient Name: Date of Service: FO RA Donald Jones, Donald BERT Jones. 01/09/2023 2:30 PM Medical Record Number: [id number] Patient Account Number: [id number] Date of Birth/Sex: Treating RN: [birth year] (77 y.o. Donald Jones, Donald Jones ([id number]) [id number] Page 8 of 11 Primary Care Yoltzin Barg: [name] Other Clinician: Referring Everli Rother: Treating Brandy Kabat/Extender: [name], [name] in Treatment: 8 Wound Status Wound Number: 5 Primary Venous Leg  Ulcer Etiology: Wound Location: Left, Medial Lower Leg Secondary Diabetic Wound/Ulcer of the Lower Extremity Wounding Event: Blister Etiology: Date Acquired: 12/26/2022 Wound Open Weeks Of Treatment: 1 Status: Clustered Wound: No Comorbid Chronic sinus problems/congestion, Asthma, Sleep Apnea, History: Hypertension, Type II Diabetes, Osteoarthritis, Confinement Anxiety Photos Wound Measurements Length: (cm) 0.2 Width: (cm) 0.2 Depth: (cm) 0.1 Area: (cm) 0.031 Volume: (cm) 0.003 % Reduction in Area: 80.3% % Reduction in Volume: 81.3% Epithelialization: None Tunneling: No Undermining: No Wound Description Classification: Full Thickness Without Exposed Support Structures Exudate Amount: Medium Exudate Type: Serosanguineous Exudate Color: red, brown Foul Odor After Cleansing: No Slough/Fibrino Yes Wound Bed Granulation Amount: None Present (0%) Exposed Structure Necrotic Amount: Large (67-100%) Fascia Exposed: No Necrotic Quality: Eschar, Adherent Slough Fat Layer (Subcutaneous Tissue) Exposed: Yes Tendon Exposed: No Muscle Exposed: No Joint Exposed: No Bone Exposed: No Periwound Skin Texture Texture Color No Abnormalities Noted: No No Abnormalities Noted: No Moisture No Abnormalities Noted: No Treatment Notes Wound #5 (Lower Leg) Wound Laterality: Left, Medial Cleanser Soap and Water Discharge Instruction: May shower and wash wound with dial antibacterial soap and water prior to dressing change. Vashe 5.8 (oz) Discharge Instruction: Cleanse the wound with Vashe prior to applying a clean dressing using gauze sponges, not tissue or cotton balls. Peri-Wound Care Triamcinolone 15 (g) Discharge Instruction: Use triamcinolone 15 (g) as directed Sween Lotion (Moisturizing lotion) Discharge Instruction: Apply moisturizing lotion as directed Donald Jones, Donald Jones ([id number]) [phone].pdf Page 9 of 11 Topical Primary Dressing Xeroform Occlusive  Gauze Dressing, 4x4 in Discharge Instruction: Apply to wound bed as instructed Secondary Dressing Woven Gauze Sponge, Non-Sterile 4x4 in Discharge Instruction: Apply over primary dressing as directed. Secured With Compression Wrap Urgo K2 Lite, (equivalent to a 3 layer) two layer compression system, regular Discharge Instruction: Apply Urgo K2 Lite as directed (alternative to 3 layer compression). Compression Stockings Circaid Juxta Lite Compression Wrap Quantity: 1 Left Leg Compression Amount: 30-40 mmHg Discharge Instruction: Apply Circaid Juxta Lite Compression Wrap daily as instructed. Apply first thing in the morning, remove at night before bed. Add-Ons Electronic Signature(s) Signed: 01/09/2023 5:19:03 PM By: Shawn Stall RN, BSN Signed: 01/10/2023 4:28:55 PM By: Thayer Dallas Entered By: Thayer Dallas on 01/09/2023 15:26:20 --------------------------------------------------------------------------------

## 2023-01-11 ENCOUNTER — Ambulatory Visit: Payer: Medicare Other | Admitting: Podiatry

## 2023-01-15 ENCOUNTER — Encounter: Payer: Self-pay | Admitting: Podiatry

## 2023-01-15 ENCOUNTER — Ambulatory Visit (INDEPENDENT_AMBULATORY_CARE_PROVIDER_SITE_OTHER): Payer: Medicare Other | Admitting: Podiatry

## 2023-01-15 DIAGNOSIS — M79674 Pain in right toe(s): Secondary | ICD-10-CM | POA: Diagnosis not present

## 2023-01-15 DIAGNOSIS — B351 Tinea unguium: Secondary | ICD-10-CM | POA: Diagnosis not present

## 2023-01-15 DIAGNOSIS — I872 Venous insufficiency (chronic) (peripheral): Secondary | ICD-10-CM

## 2023-01-15 DIAGNOSIS — M79675 Pain in left toe(s): Secondary | ICD-10-CM | POA: Diagnosis not present

## 2023-01-15 NOTE — Progress Notes (Signed)
This patient presents to the office with chief complaint of long thick painful nails.  Patient says the nails are painful walking and wearing shoes.  This patient is unable to self treat.  This patient is unable to trim his nails since he is unable to reach his nails.  He is diagnosed with venous stasis.  he presents to the office for preventative foot care services.  General Appearance  Alert, conversant and in no acute stress.  Vascular  Dorsalis pedis and posterior tibial  pulses are absent  bilaterally.  Capillary return is within normal limits  bilaterally. Temperature is within normal limits  bilaterally.  Neurologic  Senn-Weinstein monofilament wire test within normal limits  bilaterally. Muscle power within normal limits bilaterally.  Nails Thick disfigured discolored nails with subungual debris  from hallux to fifth toes bilaterally. No evidence of bacterial infection or drainage bilaterally.  Orthopedic  No limitations of motion  feet .  No crepitus or effusions noted.  No bony pathology or digital deformities noted.  Skin  normotropic skin with no porokeratosis noted bilaterally.  No signs of infections or ulcers noted.     Onychomycosis  Nails  B/L.  Pain in right toes  Pain in left toes  Debridement of nails both feet followed trimming the nails with dremel tool.    RTC 3 months.   Helane Gunther DPM

## 2023-01-16 ENCOUNTER — Ambulatory Visit: Payer: Medicare Other | Admitting: Interventional Cardiology

## 2023-01-18 ENCOUNTER — Encounter (HOSPITAL_BASED_OUTPATIENT_CLINIC_OR_DEPARTMENT_OTHER): Payer: Medicare Other | Attending: Internal Medicine | Admitting: Internal Medicine

## 2023-01-18 DIAGNOSIS — Z09 Encounter for follow-up examination after completed treatment for conditions other than malignant neoplasm: Secondary | ICD-10-CM | POA: Insufficient documentation

## 2023-01-18 DIAGNOSIS — S80821A Blister (nonthermal), right lower leg, initial encounter: Secondary | ICD-10-CM | POA: Diagnosis not present

## 2023-01-18 DIAGNOSIS — I87309 Chronic venous hypertension (idiopathic) without complications of unspecified lower extremity: Secondary | ICD-10-CM | POA: Insufficient documentation

## 2023-01-18 DIAGNOSIS — E119 Type 2 diabetes mellitus without complications: Secondary | ICD-10-CM | POA: Insufficient documentation

## 2023-01-18 DIAGNOSIS — R21 Rash and other nonspecific skin eruption: Secondary | ICD-10-CM | POA: Diagnosis not present

## 2023-01-18 DIAGNOSIS — G4733 Obstructive sleep apnea (adult) (pediatric): Secondary | ICD-10-CM | POA: Insufficient documentation

## 2023-01-18 DIAGNOSIS — Z7984 Long term (current) use of oral hypoglycemic drugs: Secondary | ICD-10-CM | POA: Insufficient documentation

## 2023-01-18 DIAGNOSIS — S80822A Blister (nonthermal), left lower leg, initial encounter: Secondary | ICD-10-CM | POA: Diagnosis not present

## 2023-01-19 NOTE — Progress Notes (Signed)
Donald Jones ([id number]) [id number] Page 1 of 5 Visit Report for 01/18/2023 HPI Details Patient Name: Date of Service: Donald RA NT, [name] 01/18/2023 9:00 A M Medical Record Number: [id number] Patient Account Number: [id number] Date of Birth/Sex: Treating RN: Sep 03, 1945 (77 y.o. M) Primary Care Provider: [name] Other Clinician: Referring Provider: Treating Provider/Extender: [name] in Treatment: 9 History of Present Illness HPI Description: 11/14/2022 ABI Left: 0.96 and ABI Right: 1.03 11/14/2022 [name] is a 77 year old male with a past medical history of controlled type 2 diabetes on oral agents, hypertension, OSA and venous insufficiency that presents to the clinic for a 1-29-month history of increased swelling to the lower extremities with wounds. He visited urgent care on 7/20 for this issue. He was started on doxycycline for potential cellulitis and and is currently taking the medication. He does not have compression stockings. He states that the wounds on his legs happened spontaneously. He has been using soap and water and keeping the areas covered. He currently denies signs of infection. 8/6; patient presents for follow-up. We have been using antibiotic ointment with silver alginate under Kerlix/Coban. He has done well with this and most of his wounds have epithelialized. He has a few scattered open areas. He has his compression stockings with him today. He has no issues or complaints. 8/13; patient presents for follow-up. Last visit we used Xeroform under Kerlix/Coban to lower extremities bilaterally. His wounds of healed. He has his compression stockings with him today. READMISSION 01/02/2023. Patient is a 77 year old man who is a diabetic on oral agents however he has chronic venous insufficiency with a long history of wounds that usually heal. He was in the clinic from 11/14/2022 through 11/28/2022. He had wounds on  his bilateral lower extremities we use Xeroform kerlix Coban and he had healed when he was discharged here on 11/28/2022. I believe he transitioned into 20/30 below-knee stockings from elastic therapy. It sounds as though he was compliant however 2 days ago noted a blister on the right anterior lower leg that ruptured when he was putting on his stockings. He has not worn his stockings since he has developed an area on the left anterior mid tibia as well. Somewhat painful but not particularly so. 9/24; patient's wounds are certainly smaller this week. I thought they might be healed. Still a small open area on the right lateral lower right anterior lateral lower leg and is even smaller area medially all of this is just about closed. 10/3; wounds are closed this week. The only thing we noticed with was a rash on the lateral part of both feet. There was not any drainage but he is complaining of this being pruritic. He has bilateral juxta lite stockings Electronic Signature(s) Signed: 01/18/2023 5:27:16 PM By: Baltazar Najjar MD Entered By: Baltazar Najjar on 01/18/2023 06:56:17 -------------------------------------------------------------------------------- Physical Exam Details Patient Name: Date of Service: Donald Kells NT, [name] 01/18/2023 9:00 A M Medical Record Number: [id number] Patient Account Number: [id number] Date of Birth/Sex: Treating RN: April 15, 1946 (77 y.o. M) Primary Care Provider: [name] Other Clinician: Referring Provider: Treating Provider/Extender: [name] in Treatment: 9 Constitutional Patient is hypertensive.. Pulse regular and within target range for patient.Marland Kitchen Respirations regular, non-labored and within target range.. Temperature is normal and within the target range for the patient.Marland Kitchen Appears in no distress. Notes Wound exam; there is no open wound. We have good edema control. Significant chronic venous insufficiency with hemosiderin deposition. On  the lateral aspect of both feet there is what appears to be a topical fungal rash perhaps because of moisture. We will put ketoconazole on this Donald Jones ([id number]) [id number] Page 2 of 5 Electronic Signature(s) Signed: 01/18/2023 5:27:16 PM By: Baltazar Najjar MD Entered By: Baltazar Najjar on 01/18/2023 06:57:41 -------------------------------------------------------------------------------- Physician Orders Details Patient Name: Date of Service: Donald Kells NT, [name] 01/18/2023 9:00 A M Medical Record Number: [id number] Patient Account Number: [id number] Date of Birth/Sex: Treating RN: Aug 03, 1945 (77 y.o. [name] Primary Care Provider: [name] Other Clinician: Referring Provider: Treating Provider/Extender: [name] in Treatment: 9 Verbal / Phone Orders: No Diagnosis Coding Discharge From [facility] Services Discharge from Wound Care Center - Congratulations on your wounds healing!! Wear Juxtalites daily Get Lotramin cream to appy daily to feet Bathing/ Shower/ Hygiene May shower and wash wound with soap and water. Edema Control - Lymphedema / SCD / Other Bilateral Lower Extremities Elevate legs to the level of the heart or above for 30 minutes daily and/or when sitting for 3-4 times a day throughout the day. Avoid standing for long periods of time. Exercise regularly - walking is good Compression stocking or Garment 30-40 mm/Hg pressure to: - Juxtalite HD bilateral lower legs. Electronic Signature(s) Signed: 01/18/2023 4:51:22 PM By: [name] RN, BSN Signed: 01/18/2023 5:27:16 PM By: Baltazar Najjar MD Entered By: [name] on 01/18/2023 06:53:41 -------------------------------------------------------------------------------- Problem List Details Patient Name: Date of Service: Donald Kells NT, [name] 01/18/2023 9:00 A M Medical Record Number: [id number] Patient Account Number: [id number] Date of Birth/Sex:  Treating RN: 03-08-1946 (77 y.o. M) Primary Care Provider: [name] Other Clinician: Referring Provider: Treating Provider/Extender: [name] in Treatment: 9 Active Problems ICD-10 Encounter Code Description Active Date MDM Diagnosis I87.313 Chronic venous hypertension (idiopathic) with ulcer of bilateral lower extremity 11/14/2022 No Yes L97.811 Non-pressure chronic ulcer of other part of right lower leg limited to breakdown 11/14/2022 No Yes of skin SHLOIMY, MICHALSKI ([id number]) [id number] Page 3 of 5 [phone] Non-pressure chronic ulcer of other part of left lower leg limited to breakdown 11/14/2022 No Yes of skin E11.622 Type 2 diabetes mellitus with other skin ulcer 11/14/2022 No Yes Inactive Problems Resolved Problems Electronic Signature(s) Signed: 01/18/2023 5:27:16 PM By: Baltazar Najjar MD Entered By: Baltazar Najjar on 01/18/2023 06:55:20 -------------------------------------------------------------------------------- Progress Note Details Patient Name: Date of Service: Donald Kells NT, [name] 01/18/2023 9:00 A M Medical Record Number: [id number] Patient Account Number: [id number] Date of Birth/Sex: Treating RN: 12-Feb-1946 (77 y.o. M) Primary Care Provider: [name] Other Clinician: Referring Provider: Treating Provider/Extender: [name], [name] in Treatment: 9 Subjective History of Present Illness (HPI) 11/14/2022 ABI Left: 0.96 and ABI Right: 1.03 11/14/2022 [name] is a 77 year old male with a past medical history of controlled type 2 diabetes on oral agents, hypertension, OSA and venous insufficiency that presents to the clinic for a 1-57-month history of increased swelling to the lower extremities with wounds. He visited urgent care on 7/20 for this issue. He was started on doxycycline for potential cellulitis and and is currently taking the medication. He does not have compression stockings. He  states that the wounds on his legs happened spontaneously. He has been using soap and water and keeping the areas covered. He currently denies signs of infection. 8/6; patient presents for follow-up. We have been using antibiotic ointment with silver alginate under Kerlix/Coban. He has done well with this and most of  his wounds have epithelialized. He has a few scattered open areas. He has his compression stockings with him today. He has no issues or complaints. 8/13; patient presents for follow-up. Last visit we used Xeroform under Kerlix/Coban to lower extremities bilaterally. His wounds of healed. He has his compression stockings with him today. READMISSION 01/02/2023. Patient is a 77 year old man who is a diabetic on oral agents however he has chronic venous insufficiency with a long history of wounds that usually heal. He was in the clinic from 11/14/2022 through 11/28/2022. He had wounds on his bilateral lower extremities we use Xeroform kerlix Coban and he had healed when he was discharged here on 11/28/2022. I believe he transitioned into 20/30 below-knee stockings from elastic therapy. It sounds as though he was compliant however 2 days ago noted a blister on the right anterior lower leg that ruptured when he was putting on his stockings. He has not worn his stockings since he has developed an area on the left anterior mid tibia as well. Somewhat painful but not particularly so. 9/24; patient's wounds are certainly smaller this week. I thought they might be healed. Still a small open area on the right lateral lower right anterior lateral lower leg and is even smaller area medially all of this is just about closed. 10/3; wounds are closed this week. The only thing we noticed with was a rash on the lateral part of both feet. There was not any drainage but he is complaining of this being pruritic. He has bilateral juxta lite stockings Objective Constitutional Patient is hypertensive.. Pulse  regular and within target range for patient.Marland Kitchen Respirations regular, non-labored and within target range.. Temperature is normal and within the target range for the patient.Marland Kitchen Appears in no distress. Vitals Time Taken: 9:43 AM, Height: 65 in, Weight: 240 lbs, BMI: 39.9, Temperature: 98.1 F, Pulse: 60 bpm, Respiratory Rate: 18 breaths/min, Blood Pressure: 151/76 mmHg. AWS, SHERE ([id number]) [id number] Page 4 of 5 General Notes: Wound exam; there is no open wound. We have good edema control. Significant chronic venous insufficiency with hemosiderin deposition. On the lateral aspect of both feet there is what appears to be a topical fungal rash perhaps because of moisture. We will put ketoconazole on this Integumentary (Hair, Skin) Wound #5 status is Open. Original cause of wound was Blister. The date acquired was: 12/26/2022. The wound has been in treatment 2 weeks. The wound is located on the Left,Medial Lower Leg. The wound measures 0cm length x 0cm width x 0cm depth; 0cm^2 area and 0cm^3 volume. There is no tunneling or undermining noted. There is a none present amount of drainage noted. There is no granulation within the wound bed. There is no necrotic tissue within the wound bed. Wound #6 status is Open. Original cause of wound was Blister. The date acquired was: 12/19/2022. The wound has been in treatment 2 weeks. The wound is located on the Right,Anterior Lower Leg. The wound measures 0cm length x 0cm width x 0cm depth; 0cm^2 area and 0cm^3 volume. There is no tunneling or undermining noted. There is a none present amount of drainage noted. There is no granulation within the wound bed. There is no necrotic tissue within the wound bed. Assessment Active Problems ICD-10 Chronic venous hypertension (idiopathic) with ulcer of bilateral lower extremity Non-pressure chronic ulcer of other part of right lower leg limited to breakdown of skin Non-pressure chronic ulcer  of other part of left lower leg limited to breakdown of skin Type 2 diabetes mellitus  with other skin ulcer Plan Discharge From [facility] Services: Discharge from Wound Care Center - Congratulations on your wounds healing!! Wear Juxtalites daily Get Lotramin cream to appy daily to feet Bathing/ Shower/ Hygiene: May shower and wash wound with soap and water. Edema Control - Lymphedema / SCD / Other: Elevate legs to the level of the heart or above for 30 minutes daily and/or when sitting for 3-4 times a day throughout the day. Avoid standing for long periods of time. Exercise regularly - walking is good Compression stocking or Garment 30-40 mm/Hg pressure to: - Juxtalite HD bilateral lower legs. 1. The patient is once again healed. Juxta lites stockings and we went over the application of these 2. The rash on his lateral feet looks fungal to me. We put ketoconazole on this, suggested OTC Lotrimin Electronic Signature(s) Signed: 01/18/2023 5:27:16 PM By: Baltazar Najjar MD Entered By: Baltazar Najjar on 01/18/2023 06:58:22 -------------------------------------------------------------------------------- SuperBill Details Patient Name: Date of Service: Donald Kells NT, [name] 01/18/2023 Medical Record Number: [id number] Patient Account Number: [id number] Date of Birth/Sex: Treating RN: 04/29/1945 (77 y.o. M) Primary Care Provider: [name] Other Clinician: Referring Provider: Treating Provider/Extender: [name] in Treatment: 9 Diagnosis Coding ICD-10 Codes Code Description [id number] Chronic venous hypertension (idiopathic) with ulcer of bilateral lower extremity L97.811 Non-pressure chronic ulcer of other part of right lower leg limited to breakdown of skin GARYSON, STELLY ([id number]) [id number] Page 5 of 5 [phone] Non-pressure chronic ulcer of other part of left lower leg limited to breakdown of skin E11.622 Type 2 diabetes mellitus with other  skin ulcer Facility Procedures : CPT4 Code: [id number] Description: 99213 - WOUND CARE VISIT-LEV 3 EST PT Modifier: Quantity: 1 Physician Procedures : CPT4 Code Description Modifier [id number] - WC PHYS LEVEL 2 - EST PT ICD-10 Diagnosis Description I87.313 Chronic venous hypertension (idiopathic) with ulcer of bilateral lower extremity L97.811 Non-pressure chronic ulcer of other part of right  lower leg limited to breakdown of skin L97.821 Non-pressure chronic ulcer of other part of left lower leg limited to breakdown of skin Quantity: 1 Electronic Signature(s) Signed: 01/18/2023 4:51:22 PM By: [name] RN, BSN Signed: 01/18/2023 5:27:16 PM By: Baltazar Najjar MD Entered By: [name] on 01/18/2023 08:35:04

## 2023-01-19 NOTE — Progress Notes (Signed)
CURTIS, CAIN ([id number]) [id number] Page 1 of 8 Visit Report for 01/18/2023 Arrival Information Details Patient Name: Date of Service: [name], [name] Donald H. 01/18/2023 9:00 A M Medical Record Number: [id number] Patient Account Number: [id number] Date of Birth/Sex: Treating RN: 1945-11-16 (78 y.o. Donald Jones Primary Care Donald Jones: Donald Jones Other Clinician: Referring Donald Jones: Treating Donald Jones/Extender: Donald Jones in Treatment: 9 Visit Information History Since Last Visit Added or deleted any medications: No Patient Arrived: Ambulatory Any new allergies or adverse reactions: No Arrival Time: 09:37 Had a fall or experienced change in No Accompanied By: self activities of daily living that may affect Transfer Assistance: None risk of falls: Patient Identification Verified: Yes Signs or symptoms of abuse/neglect since last visito No Secondary Verification Process Completed: Yes Hospitalized since last visit: No Patient Has Alerts: Yes Implantable device outside of the clinic excluding No Patient Alerts: Patient on Blood Thinner cellular tissue based products placed in the center since last visit: Has Dressing in Place as Prescribed: Yes Has Compression in Place as Prescribed: Yes Pain Present Now: No Electronic Signature(s) Signed: 01/18/2023 4:51:22 PM By: [name] RN, BSN Entered By: [name] on 01/18/2023 06:39:11 -------------------------------------------------------------------------------- Clinic Level of Care Assessment Details Patient Name: Date of Service: [name], [name] 01/18/2023 9:00 A M Medical Record Number: [id number] Patient Account Number: [id number] Date of Birth/Sex: Treating RN: 1945-08-14 (77 y.o. Donald Jones Primary Care Donald Jones: Donald Jones Other Clinician: Referring Donald Jones: Treating Donald Jones/Extender: Donald Jones in Treatment: 9 Clinic Level of Care  Assessment Items TOOL 4 Quantity Score X- 1 0 Use when only an EandM is performed on FOLLOW-UP visit ASSESSMENTS - Nursing Assessment / Reassessment X- 1 10 Reassessment of Co-morbidities (includes updates in patient status) X- 1 5 Reassessment of Adherence to Treatment Plan ASSESSMENTS - Wound and Skin A ssessment / Reassessment []  - 0 Simple Wound Assessment / Reassessment - one wound X- 2 5 Complex Wound Assessment / Reassessment - multiple wounds []  - 0 Dermatologic / Skin Assessment (not related to wound area) ASSESSMENTS - Focused Assessment X- 2 5 Circumferential Edema Measurements - multi extremities []  - 0 Nutritional Assessment / Counseling / Intervention [name], Jones ([id number]) [id number] Page 2 of 8 []  - 0 Lower Extremity Assessment (monofilament, tuning fork, pulses) []  - 0 Peripheral Arterial Disease Assessment (using hand held doppler) ASSESSMENTS - Ostomy and/or Continence Assessment and Care []  - 0 Incontinence Assessment and Management []  - 0 Ostomy Care Assessment and Management (repouching, etc.) PROCESS - Coordination of Care []  - 0 Simple Patient / Family Education for ongoing care []  - 0 Complex (extensive) Patient / Family Education for ongoing care X- 1 10 Staff obtains Chiropractor, Records, T Results / Process Orders est []  - 0 Staff telephones HHA, Nursing Homes / Clarify orders / etc []  - 0 Routine Transfer to another Facility (non-emergent condition) []  - 0 Routine Hospital Admission (non-emergent condition) []  - 0 New Admissions / Manufacturing engineer / Ordering NPWT Apligraf, etc. , []  - 0 Emergency Hospital Admission (emergent condition) X- 1 10 Simple Discharge Coordination []  - 0 Complex (extensive) Discharge Coordination PROCESS - Special Needs []  - 0 Pediatric / Minor Patient Management []  - 0 Isolation Patient Management []  - 0 Hearing / Language / Visual special needs []  - 0 Assessment  of Community assistance (transportation, D/C planning, etc.) []  - 0 Additional assistance / Altered mentation []  - 0 Support Surface(s) Assessment (bed, cushion, seat,  etc.) INTERVENTIONS - Wound Cleansing / Measurement []  - 0 Simple Wound Cleansing - one wound X- 2 5 Complex Wound Cleansing - multiple wounds []  - 0 Wound Imaging (photographs - any number of wounds) []  - 0 Wound Tracing (instead of photographs) []  - 0 Simple Wound Measurement - one wound X- 2 5 Complex Wound Measurement - multiple wounds INTERVENTIONS - Wound Dressings []  - 0 Small Wound Dressing one or multiple wounds []  - 0 Medium Wound Dressing one or multiple wounds []  - 0 Large Wound Dressing one or multiple wounds []  - 0 Application of Medications - topical []  - 0 Application of Medications - injection INTERVENTIONS - Miscellaneous []  - 0 External ear exam []  - 0 Specimen Collection (cultures, biopsies, blood, body fluids, etc.) []  - 0 Specimen(s) / Culture(s) sent or taken to Lab for analysis []  - 0 Patient Transfer (multiple staff / Nurse, adult / Similar devices) []  - 0 Simple Staple / Suture removal (25 or less) []  - 0 Complex Staple / Suture removal (26 or more) []  - 0 Hypo / Hyperglycemic Management (close monitor of Blood Glucose) Donald, Jones ([id number]) [id number].pdf Page 3 of 8 []  - 0 Ankle / Brachial Index (ABI) - do not check if billed separately X- 1 5 Vital Signs Has the patient been seen at the hospital within the last three years: Yes Total Score: 80 Level Of Care: New/Established - Level 3 Electronic Signature(s) Signed: 01/18/2023 4:51:22 PM By: [name] RN, BSN Entered By: [name] on 01/18/2023 08:34:56 -------------------------------------------------------------------------------- Encounter Discharge Information Details Patient Name: Date of Service: Donald Jones NT, [name] 01/18/2023 9:00 A M Medical Record Number:  [id number] Patient Account Number: [id number] Date of Birth/Sex: Treating RN: 06-12-1945 (77 y.o. Donald Jones Primary Care Donald Jones: Donald Jones Other Clinician: Referring Donald Jones: Treating Donald Jones/Extender: Donald Jones in Treatment: 9 Encounter Discharge Information Items Discharge Condition: Stable Ambulatory Status: Ambulatory Discharge Destination: Home Transportation: Private Auto Accompanied By: self Schedule Follow-up Appointment: Yes Clinical Summary of Care: Patient Declined Electronic Signature(s) Signed: 01/18/2023 4:51:22 PM By: [name] RN, BSN Entered By: [name] on 01/18/2023 08:36:09 -------------------------------------------------------------------------------- Lower Extremity Assessment Details Patient Name: Date of Service: [name], [name] 01/18/2023 9:00 A M Medical Record Number: [id number] Patient Account Number: [id number] Date of Birth/Sex: Treating RN: 13-Apr-1946 (77 y.o. Donald Jones Primary Care Kryssa Risenhoover: Donald Jones Other Clinician: Referring Jennene Downie: Treating May Ozment/Extender: Donald Jones in Treatment: 9 Edema Assessment Assessed: [Left: No] [Right: No] Edema: [Left: Yes] [Right: Yes] Calf Left: Right: Point of Measurement: 32 cm From Medial Instep 33 cm 35 cm Ankle Left: Right: Point of Measurement: 14 cm From Medial Instep 20.3 cm 19 cm Vascular Assessment Left: [[id number] Page 4 of 8Right:] Pulses: Dorsalis Pedis Palpable: [[id number] Page 4 of 8Yes Yes] Extremity colors, hair growth, and conditions: Extremity Color: [[id number] Page 4 of 8Hyperpigmented Hyperpigmented] Hair Growth on Extremity: [[id number] Page 4 of 8No No] Temperature of Extremity: [[id number] Page 4 of 8Warm Warm] Capillary Refill: [id number].pdf  Page 4 of 8< 3 seconds < 3 seconds] Dependent Rubor: [[id number].pdf Page 4 of 8No No No No] Electronic Signature(s) Signed: 01/18/2023 4:51:22 PM By: [name] RN, BSN Entered By: [name] on 01/18/2023 06:40:31 -------------------------------------------------------------------------------- Multi Wound Chart Details Patient Name: Date of Service: [name], [name] 01/18/2023 9:00 A M Medical Record Number: [id number] Patient Account Number: [id number] Date of Birth/Sex: Treating RN: 1945/08/25 (77 y.o. M) Primary Care  Orland Visconti: Donald Jones Other Clinician: Referring Siedah Sedor: Treating Katlyn Muldrew/Extender: [name], [name] in Treatment: 9 Vital Signs Height(in): 65 Pulse(bpm): 60 Weight(lbs): 240 Blood Pressure(mmHg): 151/76 Body Mass Index(BMI): 39.9 Temperature(F): 98.1 Respiratory Rate(breaths/min): 18 [5:Photos: No Photos Left, Medial Lower Leg Wound Location: Blister Wounding Event: Venous Leg Ulcer Primary Etiology: Diabetic Wound/Ulcer of the Lower Secondary Etiology: Extremity Chronic sinus problems/congestion, Comorbid History: Asthma, Sleep Apnea,  Hypertension, Type II Diabetes, Osteoarthritis, Confinement Anxiety 12/26/2022 Date Acquired: 2 Weeks of Treatment: Open Wound Status: No Wound Recurrence: No Clustered Wound: N/A Clustered Quantity: 0x0x0 Measurements L x W x D (cm) 0 A (cm) : rea 0  Volume (cm) : 100.00% % Reduction in Area: 100.00% % Reduction in Volume: Full Thickness Without Exposed Classification: Support Structures None Present Exudate Amount: None Present (0%) Granulation Amount: None Present (0%) Necrotic Amount: Fascia: No  Exposed Structures: Fat Layer (Subcutaneous Tissue): No Tendon: No Muscle: No Joint: No Bone: No Large (67-100%) Epithelialization:] [6:No Photos Right, Anterior Lower Leg Blister Venous Leg Ulcer Diabetic Wound/Ulcer of the Lower Extremity Chronic sinus  problems/congestion, Asthma, Sleep  Apnea, Hypertension, Type II Diabetes, Osteoarthritis, Confinement Anxiety 12/19/2022 2 Open No Yes 2 0x0x0 0 0 100.00% 100.00% Full Thickness Without Exposed Support Structures None Present None Present (0%) None  Present (0%) Fascia: No Fat Layer (Subcutaneous Tissue): No Tendon: No Muscle: No Joint: No Bone: No None] [N/A:N/A N/A N/A N/A N/A N/A N/A N/A N/A N/A N/A N/A N/A N/A N/A N/A N/A N/A N/A N/A N/A N/A N/A] Treatment Notes Donald, Jones ([id number]) [id number] Page 5 of 8 Electronic Signature(s) Signed: 01/18/2023 5:27:16 PM By: Baltazar Najjar MD Entered By: Baltazar Najjar on 01/18/2023 06:55:33 -------------------------------------------------------------------------------- Multi-Disciplinary Care Plan Details Patient Name: Date of Service: Donald Jones NT, [name] 01/18/2023 9:00 A M Medical Record Number: [id number] Patient Account Number: [id number] Date of Birth/Sex: Treating RN: 1945-09-03 (77 y.o. Donald Jones Primary Care Dwyane Dupree: Donald Jones Other Clinician: Referring Shenelle Klas: Treating Sebert Stollings/Extender: Donald Jones in Treatment: 9 Active Inactive Electronic Signature(s) Signed: 01/18/2023 4:51:22 PM By: [name] RN, BSN Entered By: [name] on 01/18/2023 08:31:39 -------------------------------------------------------------------------------- Pain Assessment Details Patient Name: Date of Service: Donald Jones NT, [name] 01/18/2023 9:00 A M Medical Record Number: [id number] Patient Account Number: [id number] Date of Birth/Sex: Treating RN: Mar 24, 1946 (77 y.o. Donald Jones Primary Care Mekaila Tarnow: Donald Jones Other Clinician: Referring Londyn Hotard: Treating Keamber Macfadden/Extender: Donald Jones in Treatment: 9 Active Problems Location of Pain Severity and Description of Pain Patient Has Paino No Site Locations Pain Management and Medication Current Pain Management: Donald, Jones ([id number])  [id number].pdf Page 6 of 8 Electronic Signature(s) Signed: 01/18/2023 4:51:22 PM By: [name] RN, BSN Entered By: [name] on 01/18/2023 06:39:29 -------------------------------------------------------------------------------- Patient/Caregiver Education Details Patient Name: Date of Service: Donald Jones NT, RO Donald Donald Jones 10/3/2024andnbsp9:00 A M Medical Record Number: [id number] Patient Account Number: [id number] Date of Birth/Gender: Treating RN: 09/18/1945 (77 y.o. Donald Jones Primary Care Physician: Donald Jones Other Clinician: Referring Physician: Treating Physician/Extender: Donald Jones in Treatment: 9 Education Assessment Education Provided To: Patient Education Topics Provided Venous: Methods: Explain/Verbal Responses: State content correctly Wound/Skin Impairment: Methods: Explain/Verbal Responses: State content correctly Electronic Signature(s) Signed: 01/18/2023 4:51:22 PM By: [name] RN, BSN Entered By: [name] on 01/18/2023 08:32:31 -------------------------------------------------------------------------------- Wound Assessment Details Patient Name: Date of Service: Donald Jones NT, [name] 01/18/2023 9:00 A M Medical Record Number: [id number] Patient Account  Number: [id number] Date of Birth/Sex: Treating RN: [birth year] (77 y.o. Donald Jones Primary Care Johany Hansman: Donald Jones Other Clinician: Referring Laparis Durrett: Treating Kilynn Fitzsimmons/Extender: Donald Jones in Treatment: 9 Wound Status Wound Number: 5 Primary Venous Leg Ulcer Etiology: Wound Location: Left, Medial Lower Leg Secondary Diabetic Wound/Ulcer of the Lower Extremity Wounding Event: Blister Etiology: Date Acquired: 12/26/2022 Wound Open Weeks Of Treatment: 2 Status: Clustered Wound: No Comorbid Chronic sinus problems/congestion, Asthma, Sleep Apnea, History: Hypertension, Type II Diabetes, Osteoarthritis,  Confinement Anxiety Wound Measurements Length: (cm) Width: (cm) Depth: (cm) Area: (cm) Donald, Jones ([id number]) Volume: (cm) 0 % Reduction in Area: 100% 0 % Reduction in Volume: 100% 0 Epithelialization: Large (67-100%) 0 Tunneling: No [id number].pdf Page 7 of 8 0 Undermining: No Wound Description Classification: Full Thickness Without Exposed Sup Exudate Amount: None Present port Structures Foul Odor After Cleansing: No Slough/Fibrino No Wound Bed Granulation Amount: None Present (0%) Exposed Structure Necrotic Amount: None Present (0%) Fascia Exposed: No Fat Layer (Subcutaneous Tissue) Exposed: No Tendon Exposed: No Muscle Exposed: No Joint Exposed: No Bone Exposed: No Periwound Skin Texture Texture Color No Abnormalities Noted: No No Abnormalities Noted: No Moisture No Abnormalities Noted: No Electronic Signature(s) Signed: 01/18/2023 4:51:22 PM By: [name] RN, BSN Entered By: [name] on 01/18/2023 06:41:36 -------------------------------------------------------------------------------- Wound Assessment Details Patient Name: Date of Service: Donald Jones NT, [name] 01/18/2023 9:00 A M Medical Record Number: [id number] Patient Account Number: [id number] Date of Birth/Sex: Treating RN: [birth year] (77 y.o. Donald Jones Primary Care Meet Weathington: Donald Jones Other Clinician: Referring Casson Catena: Treating Zackrey Dyar/Extender: Donald Jones in Treatment: 9 Wound Status Wound Number: 6 Primary Venous Leg Ulcer Etiology: Wound Location: Right, Anterior Lower Leg Secondary Diabetic Wound/Ulcer of the Lower Extremity Wounding Event: Blister Etiology: Date Acquired: 12/19/2022 Wound Open Weeks Of Treatment: 2 Status: Clustered Wound: Yes Comorbid Chronic sinus problems/congestion, Asthma, Sleep Apnea, History: Hypertension, Type II Diabetes, Osteoarthritis, Confinement Anxiety Wound Measurements Length:  (cm) Width: (cm) Depth: (cm) Clustered Quantity: Area: (cm) Volume: (cm) 0 % Reduction in Area: 100% 0 % Reduction in Volume: 100% 0 Epithelialization: None 2 Tunneling: No 0 Undermining: No 0 Wound Description Classification: Full Thickness Without Exposed Sup Exudate Amount: None Present port Structures Foul Odor After Cleansing: No Slough/Fibrino No Wound Bed Granulation Amount: None Present (0%) Exposed Structure Necrotic Amount: None Present (0%) Fascia Exposed: No Fat Layer (Subcutaneous Tissue) Exposed: No Tendon Exposed: No Muscle Exposed: No Donald, Jones ([id number]) [id number] Page 8 of 8 Joint Exposed: No Bone Exposed: No Periwound Skin Texture Texture Color No Abnormalities Noted: No No Abnormalities Noted: No Moisture No Abnormalities Noted: No Electronic Signature(s) Signed: 01/18/2023 4:51:22 PM By: [name] RN, BSN Entered By: [name] on 01/18/2023 06:42:55 -------------------------------------------------------------------------------- Vitals Details Patient Name: Date of Service: Donald Jones NT, [name] 01/18/2023 9:00 A M Medical Record Number: [id number] Patient Account Number: [id number] Date of Birth/Sex: Treating RN: [birth year] (77 y.o. Donald Jones Primary Care Mariell Nester: Donald Jones Other Clinician: Referring Yolandra Habig: Treating Norlan Rann/Extender: Donald Jones in Treatment: 9 Vital Signs Time Taken: 09:43 Temperature (F): 98.1 Height (in): 65 Pulse (bpm): 60 Weight (lbs): 240 Respiratory Rate (breaths/min): 18 Body Mass Index (BMI): 39.9 Blood Pressure (mmHg): 151/76 Reference Range: 80 - 120 mg / dl Electronic Signature(s) Signed: 01/18/2023 4:51:22 PM By: [name] RN, BSN Entered By: [name] on 01/18/2023 06:43:30

## 2023-01-23 DIAGNOSIS — N183 Chronic kidney disease, stage 3 unspecified: Secondary | ICD-10-CM | POA: Diagnosis not present

## 2023-01-23 DIAGNOSIS — E1165 Type 2 diabetes mellitus with hyperglycemia: Secondary | ICD-10-CM | POA: Diagnosis not present

## 2023-01-23 DIAGNOSIS — E0590 Thyrotoxicosis, unspecified without thyrotoxic crisis or storm: Secondary | ICD-10-CM | POA: Diagnosis not present

## 2023-01-23 DIAGNOSIS — E041 Nontoxic single thyroid nodule: Secondary | ICD-10-CM | POA: Diagnosis not present

## 2023-01-23 DIAGNOSIS — D751 Secondary polycythemia: Secondary | ICD-10-CM | POA: Diagnosis not present

## 2023-01-24 ENCOUNTER — Encounter: Payer: Self-pay | Admitting: Internal Medicine

## 2023-01-29 ENCOUNTER — Other Ambulatory Visit: Payer: Self-pay | Admitting: Internal Medicine

## 2023-01-30 ENCOUNTER — Other Ambulatory Visit: Payer: Self-pay | Admitting: Internal Medicine

## 2023-01-30 DIAGNOSIS — E041 Nontoxic single thyroid nodule: Secondary | ICD-10-CM

## 2023-02-02 ENCOUNTER — Other Ambulatory Visit (INDEPENDENT_AMBULATORY_CARE_PROVIDER_SITE_OTHER): Payer: Medicare Other

## 2023-02-02 DIAGNOSIS — I10 Essential (primary) hypertension: Secondary | ICD-10-CM | POA: Diagnosis not present

## 2023-02-02 LAB — BASIC METABOLIC PANEL
BUN: 19 mg/dL (ref 6–23)
CO2: 28 meq/L (ref 19–32)
Calcium: 9.8 mg/dL (ref 8.4–10.5)
Chloride: 101 meq/L (ref 96–112)
Creatinine, Ser: 1.32 mg/dL (ref 0.40–1.50)
GFR: 52.88 mL/min — ABNORMAL LOW (ref >60.00)
Glucose, Bld: 158 mg/dL — ABNORMAL HIGH (ref 70–99)
Potassium: 4.2 meq/L (ref 3.5–5.1)
Sodium: 139 meq/L (ref 135–145)

## 2023-02-12 ENCOUNTER — Telehealth: Payer: Self-pay | Admitting: Internal Medicine

## 2023-02-12 NOTE — Telephone Encounter (Signed)
Pt called and requested to speak with you regarding his jardiance medication.

## 2023-02-15 NOTE — Telephone Encounter (Signed)
Attempted to return patient call but patient not available. Family member asked me to call back in about 30 minutes.

## 2023-02-16 ENCOUNTER — Encounter: Payer: Self-pay | Admitting: Pharmacist

## 2023-02-16 NOTE — Telephone Encounter (Signed)
Returned patient's call - see documentation notes.

## 2023-02-16 NOTE — Progress Notes (Signed)
02/16/2023 Name: Donald Jones MRN: [id number] DOB: 20-Dec-1945  Chief Complaint  Patient presents with   Medication Management   Diabetes    Donald Jones is a 77 y.o. year old male who presented for a telephone visit.   They were referred to the pharmacist by their PCP for assistance in managing diabetes and medication access.    Subjective:  Medication Access/Adherence  Current Pharmacy:  [facility] - [place], [place] - [address] [place] [address] Phone: [phone] Fax: [phone]  [id number] - [place], [place] - [address] [place] [address] Phone: [phone] Fax: [phone]   Patient reports affordability concerns with their medications: Yes  Patient reports access/transportation concerns to their pharmacy: No  Patient reports adherence concerns with their medications:  Yes  - never started Mounjaro or Ozempid due to cost   Diabetes:  Current medications: Jardiance 10mg  daily; Januvia 100mg  daily   Current medication access support: Januvia - thru [employer] medication assistance program thru 04/17/2023.  Assessed for Jardiance medication assistance program in past but patient's household income was about program cut off.  Same for Breo / [employer] medication assistance program.    Objective:  Lab Results  Component Value Date   HGBA1C 8.1 (H) 12/27/2022    Lab Results  Component Value Date   CREATININE 1.32 02/02/2023   BUN 19 02/02/2023   NA 139 02/02/2023   K 4.2 02/02/2023   CL 101 02/02/2023   CO2 28 02/02/2023    Lab Results  Component Value Date   CHOL 103 06/06/2022   HDL 48.30 06/06/2022   LDLCALC 45 06/06/2022   TRIG 47.0 06/06/2022   CHOLHDL 2 06/06/2022    Medications Reviewed Today     Reviewed by Henrene Pastor, RPH-CPP (Pharmacist) on 02/16/23 at 1704  Med List Status: <None>   Medication Order Taking? Sig Documenting Provider Last Dose Status  Informant  Accu-Chek FastClix Lancets MISC [id number]  Use to check blood glucose once a day (Dx: type 2 DM - E 11.9) Paz, Nolon Rod, MD  Active   albuterol (VENTOLIN HFA) 108 (90 Base) MCG/ACT inhaler [id number]  INHALE 2 PUFFS INTO THE LUNGS EVERY 6 HOURS AS NEEDED FOR WHEEZING OR SHORTNESS OF BREATH. Wanda Plump, MD  Active   AMBULATORY Clent Demark MEDICATION [id number]  GI Cocktail - 90 mls of 2% Lidocaine                      90 mls Dicyclomine 10mg /55ml                    270 mls Maalox Take 5-10 ml every 4-6 hours as needed  Patient not taking: Reported on 12/27/2022   Barrett, Joline Salt, PA-C  Active            Med Note Clydie Braun, Aurelie Dicenzo B   Thu Feb 16, 2022  4:36 PM) Hasn't used recently   amLODipine (NORVASC) 2.5 MG tablet [id number]  TAKE 1 TABLET (2.5 MG TOTAL) BY MOUTH DAILY. Saguier, Kateri Mc  Active   aspirin EC 81 MG tablet [id number]  Take 81 mg by mouth daily. Each morning. Swallow whole. [provider]  Active   atorvastatin (LIPITOR) 40 MG tablet [id number]  Take 1 tablet (40 mg total) by mouth at bedtime. Wanda Plump, MD  Active   BREO ELLIPTA 100-25 MCG/ACT AEPB [id number]  Inhale 1 puff into the  lungs daily. Wanda Plump, MD  Active   Caraway Oil-Levomenthol (FDGARD) 25-20.75 MG CAPS [id number]  Take 2 tablets by mouth in the morning and at bedtime.  [provider]  Active Self  diclofenac sodium (VOLTAREN) 1 % GEL [id number]  Apply 4 g topically 3 (three) times daily as needed. Wanda Plump, MD  Active Self  dicyclomine (BENTYL) 20 MG tablet [id number]  Take 1 tablet (20 mg total) by mouth 3 (three) times daily before meals. Wanda Plump, MD  Active   doxycycline (VIBRAMYCIN) 100 MG capsule [id number]  Take 1 capsule (100 mg total) by mouth 2 (two) times daily. Lorre Munroe, NP  Active   empagliflozin (JARDIANCE) 10 MG TABS tablet [id number]  Take 1 tablet (10 mg total) by mouth daily before breakfast. Wanda Plump, MD  Active   escitalopram (LEXAPRO) 10 MG tablet  [id number]  Take 1 tablet (10 mg total) by mouth daily. Wanda Plump, MD  Active   famotidine (PEPCID) 20 MG tablet [id number]  TAKE 1 TABLET BY MOUTH 2 TIMES DAILY. Paz, Nolon Rod, MD  Active   Flaxseed, Linseed, (FLAXSEED OIL) 1000 MG CAPS [id number]  Take 2,000 mg by mouth daily.  [provider]  Active Self  fluticasone (FLONASE) 50 MCG/ACT nasal spray [id number]  Place 1 spray into both nostrils daily. Wanda Plump, MD  Active   furosemide (LASIX) 40 MG tablet [id number]  Take 1 tablet (40 mg total) by mouth daily. Wanda Plump, MD  Active   gabapentin (NEURONTIN) 300 MG capsule [id number]  Take 1 capsule (300 mg total) by mouth at bedtime. Clayborne Dana, NP  Active   glucose blood (ACCU-CHEK GUIDE) test strip [id number]  Use to check blood glucose once a day (Dx: type 2 DM - E 11.9) Wanda Plump, MD  Active   hydrocortisone 2.5 % cream [id number]  Apply topically 2 (two) times daily. Wanda Plump, MD  Active Self  ketoconazole (NIZORAL) 2 % cream [id number]  Apply 1 Application topically daily. Wanda Plump, MD  Active   losartan (COZAAR) 100 MG tablet [id number]  Take 1 tablet (100 mg total) by mouth daily. Wanda Plump, MD  Active   meclizine (ANTIVERT) 12.5 MG tablet [id number]  Take 1 tablet (12.5 mg total) by mouth 3 (three) times daily as needed for dizziness. Saguier, Ramon Dredge, PA-C  Active            Med Note (CANTER, Vicente Males D   Wed Oct 18, 2022  2:11 PM) PRN  Multiple Vitamin (MULTIVITAMIN) tablet [id number]  Take 1 tablet by mouth daily. [provider]  Active Self  omeprazole (PRILOSEC) 20 MG capsule [id number]  Take 1 capsule (20 mg total) by mouth daily. Wanda Plump, MD  Active   Semaglutide,0.25 or 0.5MG /DOS, (OZEMPIC, 0.25 OR 0.5 MG/DOSE,) 2 MG/3ML SOPN [id number] No Inject 0.25 mg into the skin once a week.  Patient not taking: Reported on 02/16/2023   Eulis Foster, FNP Not Taking Active   sildenafil (REVATIO) 20 MG tablet [id number]  TAKE 3 TO 4 TABLETS BY MOUTH AT BEDTIME AS  NEEDED. Wanda Plump, MD  Active   sitaGLIPtin (JANUVIA) 100 MG tablet [id number]  Take 1 tablet (100 mg total) by mouth daily. Wanda Plump, MD  Active            Med Note Clydie Braun, Cindie Laroche Jul 06, 2022  4:13 PM) Approved for [employer] medication  assistance program 2024              Assessment/Plan:   Diabetes: - Currently uncontrolled - Recommend to continue Jardiance and Januvia.  - Mailing medication assistance program app for Ozempic to patient. If approved and Ozmepic started, Januvia will be stopped.  - Discussed 2025 Medicare changes with patient. He should have lower out of pocket cost for 2025. Greggory Keen may also be an option in 2025.     Follow Up Plan: 2 to 4 weeks to check on medication assistance program  Henrene Pastor, PharmD Clinical Pharmacist [facility]

## 2023-02-21 ENCOUNTER — Other Ambulatory Visit: Payer: Self-pay | Admitting: Internal Medicine

## 2023-02-22 ENCOUNTER — Encounter: Payer: Self-pay | Admitting: Internal Medicine

## 2023-02-22 DIAGNOSIS — D3132 Benign neoplasm of left choroid: Secondary | ICD-10-CM | POA: Diagnosis not present

## 2023-02-22 DIAGNOSIS — H04123 Dry eye syndrome of bilateral lacrimal glands: Secondary | ICD-10-CM | POA: Diagnosis not present

## 2023-02-22 DIAGNOSIS — H25813 Combined forms of age-related cataract, bilateral: Secondary | ICD-10-CM | POA: Diagnosis not present

## 2023-02-22 DIAGNOSIS — G4730 Sleep apnea, unspecified: Secondary | ICD-10-CM | POA: Diagnosis not present

## 2023-02-22 DIAGNOSIS — E119 Type 2 diabetes mellitus without complications: Secondary | ICD-10-CM | POA: Diagnosis not present

## 2023-02-22 LAB — HM DIABETES EYE EXAM

## 2023-03-01 ENCOUNTER — Other Ambulatory Visit: Payer: Self-pay | Admitting: Internal Medicine

## 2023-03-02 ENCOUNTER — Other Ambulatory Visit (HOSPITAL_BASED_OUTPATIENT_CLINIC_OR_DEPARTMENT_OTHER): Payer: Self-pay

## 2023-03-02 MED ORDER — COVID-19 MRNA VAC-TRIS(PFIZER) 30 MCG/0.3ML IM SUSY
0.3000 mL | PREFILLED_SYRINGE | Freq: Once | INTRAMUSCULAR | 0 refills | Status: AC
  Filled 2023-03-02: qty 0.3, 1d supply, fill #0

## 2023-03-28 ENCOUNTER — Encounter: Payer: Self-pay | Admitting: Internal Medicine

## 2023-03-28 ENCOUNTER — Telehealth: Payer: Self-pay | Admitting: Internal Medicine

## 2023-03-28 ENCOUNTER — Ambulatory Visit (INDEPENDENT_AMBULATORY_CARE_PROVIDER_SITE_OTHER): Payer: Medicare Other | Admitting: Internal Medicine

## 2023-03-28 VITALS — BP 128/68 | HR 71 | Temp 97.7°F | Resp 18 | Ht 65.0 in | Wt 241.0 lb

## 2023-03-28 DIAGNOSIS — E11620 Type 2 diabetes mellitus with diabetic dermatitis: Secondary | ICD-10-CM

## 2023-03-28 DIAGNOSIS — E0590 Thyrotoxicosis, unspecified without thyrotoxic crisis or storm: Secondary | ICD-10-CM

## 2023-03-28 DIAGNOSIS — I10 Essential (primary) hypertension: Secondary | ICD-10-CM | POA: Diagnosis not present

## 2023-03-28 DIAGNOSIS — Z7984 Long term (current) use of oral hypoglycemic drugs: Secondary | ICD-10-CM | POA: Diagnosis not present

## 2023-03-28 LAB — MICROALBUMIN / CREATININE URINE RATIO
Creatinine,U: 21.7 mg/dL
Microalb Creat Ratio: 3.2 mg/g (ref 0.0–30.0)
Microalb, Ur: <0.7 mg/dL (ref 0.0–1.9)

## 2023-03-28 LAB — HEMOGLOBIN A1C: Hgb A1c MFr Bld: 8.7 % — ABNORMAL HIGH (ref 4.6–6.5)

## 2023-03-28 NOTE — Assessment & Plan Note (Addendum)
DM: Last A1c 8.1.  Poorly controlled. We attempted to prescribe Mounjaro and Ozempic: Unable to afford.  Currently on Januvia, Jardiance (denies any genital rash). Unable to take Actos due to edema, history of metformin intolerance. Check A1c, if needed will add glimepiride, he is aware that that medication can drive his blood sugars really low so he should not take it if he plans to skip lunch or dinner. Another option would be to asked Dr. Roanna Raider to assume DM care. HTN: Creatinine was 1.5, recheck on October came back to normal at 1.3.  BP today is very good, continue amlodipine, Lasix, losartan. Subclinical hypothyroidism, thyroid nodules, since LOV, had a NM thyroid uptake, dx w/ a cold nodule R lobe, FNA scheduled. RTC 3 months

## 2023-03-28 NOTE — Telephone Encounter (Signed)
Pt dropped off paperwork. Pt does not need back. Papers placed in your tray.

## 2023-03-28 NOTE — Patient Instructions (Signed)
Diabetes: Continue checking your blood sugar in the mornings, consider checking your blood sugars in the afternoon.  Here are your goals: - early in AM fasting  ( blood sugar goal 70-130) - 2 hours after a meal (blood sugar goal less than 180) - bedtime (goal 90-150)    GO TO THE LAB : Get the blood work     Next visit with me in 3 months Please schedule it at the front desk

## 2023-03-28 NOTE — Progress Notes (Signed)
Subjective:    Patient ID: Donald Jones, male    DOB: July 12, 1945, 77 y.o.   MRN: [id number]  DOS:  03/28/2023 Type of visit - description: Follow-up  Today we talk about diabetes. He is feeling well except for his mouth is slightly dry, he drinks plenty of water, not sure if that represents polyuria.  Admits to healthy urine output without dysuria or gross hematuria. No genital rash.  Review of Systems See above   Past Medical History:  Diagnosis Date   Anxiety    Arthritis    hands right   Diabetes mellitus without complication (HCC)    GERD (gastroesophageal reflux disease)    Hypertension    Kidney stones    several episodes.  Last one in 2011.     Obesity    100-lb weight loss since 2011.    Polycythemia    Sleep apnea    on Cpap    Past Surgical History:  Procedure Laterality Date   CHOLECYSTECTOMY     HAND SURGERY  2010   R hand , d/t a injury, 3 surgeries     Current Outpatient Medications  Medication Instructions   Accu-Chek FastClix Lancets MISC Use to check blood glucose once a day (Dx: type 2 DM - E 11.9)   albuterol (VENTOLIN HFA) 108 (90 Base) MCG/ACT inhaler 2 puffs, Inhalation, Every 6 hours PRN   AMBULATORY NON FORMULARY MEDICATION GI Cocktail - 90 mls of 2% Lidocaine<BR>                     90 mls Dicyclomine 10mg /70ml<BR>                   270 mls Maalox<BR>Take 5-10 ml every 4-6 hours as needed   amLODipine (NORVASC) 2.5 mg, Oral, Daily   aspirin EC 81 mg, Oral, Daily, Each morning. Swallow whole.    atorvastatin (LIPITOR) 40 mg, Oral, Daily at bedtime   BREO ELLIPTA 100-25 MCG/ACT AEPB 1 puff, Inhalation, Daily   Caraway Oil-Levomenthol (FDGARD) 25-20.75 MG CAPS 2 tablets, Oral, 2 times daily   diclofenac sodium (VOLTAREN) 4 g, Topical, 3 times daily PRN   dicyclomine (BENTYL) 20 mg, Oral, 3 times daily before meals   doxycycline (VIBRAMYCIN) 100 mg, Oral, 2 times daily   empagliflozin (JARDIANCE) 10 mg, Oral, Daily before breakfast    escitalopram (LEXAPRO) 10 mg, Oral, Daily   famotidine (PEPCID) 20 mg, Oral, 2 times daily   Flaxseed Oil 2,000 mg, Oral, Daily   fluticasone (FLONASE) 50 MCG/ACT nasal spray 1 spray, Each Nare, Daily   furosemide (LASIX) 40 mg, Oral, Daily   gabapentin (NEURONTIN) 300 mg, Oral, Daily at bedtime   glucose blood (ACCU-CHEK GUIDE) test strip Use to check blood glucose once a day (Dx: type 2 DM - E 11.9)   hydrocortisone 2.5 % cream Topical, 2 times daily   ketoconazole (NIZORAL) 2 % cream 1 Application, Topical, Daily   losartan (COZAAR) 100 mg, Oral, Daily   meclizine (ANTIVERT) 12.5 mg, Oral, 3 times daily PRN   Multiple Vitamin (MULTIVITAMIN) tablet 1 tablet, Oral, Daily   omeprazole (PRILOSEC) 20 mg, Oral, Daily   sildenafil (REVATIO) 20 MG tablet TAKE 3 TO 4 TABLETS BY MOUTH AT BEDTIME AS NEEDED.   sitaGLIPtin (JANUVIA) 100 mg, Oral, Daily       Objective:   Physical Exam BP 128/68   Pulse 71   Temp 97.7 F (36.5 C) (Oral)   Resp 18  Ht 5\' 5"  (1.651 m)   Wt 241 lb (109.3 kg)   SpO2 98%   BMI 40.10 kg/m  General:   Well developed, NAD, BMI noted. HEENT:  Normocephalic . Face symmetric, atraumatic Lungs:  CTA B Normal respiratory effort, no intercostal retractions, no accessory muscle use. Heart: RRR,  no murmur.  Lower extremities: no pretibial edema bilaterally  Skin: Not pale. Not jaundice Neurologic:  alert & oriented X3.  Speech normal, gait appropriate for age and unassisted Psych--  Cognition and judgment appear intact.  Cooperative with normal attention span and concentration.  Behavior appropriate. No anxious or depressed appearing.      Assessment    ASSESSMENT DM--Intolerant to Metformin, see office visit 09/09/2019; actos d/c 2024 d/t edema  HTN Polycythemia- sees hematology, likely d/t OSA-diuretics, JAK2 (-) ~ 2014, last OV 10-2014, f/u prn OSA on CPAP Asthma  Anxiety ("lexapro prn") DJD- saw Dr Jerl Santos before  R Foot drop  GI: -GERD -  IBS? GI sx on and off, previously dx w/ IBS -Cscopes: 1610,9604, 09/18/2017 - EGD 04/2018, showed gastritis, no H. pylori, no malignancy Urolithiasis, several episodes CV: Sees cardiology, negative Myoview 2019, CT coronary angiography 08/2019: Minimal disease Chronic dermatitis: Lower extremities, pretibial Subclinical hyperthyroidism:   thyroid nodules per [place] 05-2021, saw endo,Dr. Roanna Raider   PLAN DM: Last A1c 8.1.  Poorly controlled. We attempted to prescribe Mounjaro and Ozempic: Unable to afford.  Currently on Januvia, Jardiance (denies any genital rash). Unable to take Actos due to edema, history of metformin intolerance. Check A1c, if needed will add glimepiride, he is aware that that medication can drive his blood sugars really low so he should not take it if he plans to skip lunch or dinner. Another option would be to asked Dr. Roanna Raider to assume DM care. HTN: Creatinine was 1.5, recheck on October came back to normal at 1.3.  BP today is very good, continue amlodipine, Lasix, losartan. Subclinical hypothyroidism, thyroid nodules, since LOV, had a NM thyroid uptake, dx w/ a cold nodule R lobe, FNA scheduled. RTC 3 months

## 2023-03-30 ENCOUNTER — Telehealth: Payer: Self-pay | Admitting: Internal Medicine

## 2023-03-30 ENCOUNTER — Ambulatory Visit
Admission: RE | Admit: 2023-03-30 | Discharge: 2023-03-30 | Disposition: A | Discharge status: Home / Self Care | Payer: Medicare Other | Source: Ambulatory Visit | Attending: Internal Medicine | Admitting: Internal Medicine

## 2023-03-30 ENCOUNTER — Other Ambulatory Visit (HOSPITAL_COMMUNITY)
Admission: RE | Admit: 2023-03-30 | Discharge: 2023-03-30 | Disposition: A | Discharge status: Home / Self Care | Payer: Medicare Other | Source: Ambulatory Visit | Attending: Interventional Radiology | Admitting: Interventional Radiology

## 2023-03-30 DIAGNOSIS — E041 Nontoxic single thyroid nodule: Secondary | ICD-10-CM | POA: Insufficient documentation

## 2023-03-30 MED ORDER — GLIMEPIRIDE 2 MG PO TABS: 2.0000 mg | ORAL_TABLET | Freq: Every day | ORAL | 0 refills | Status: DC

## 2023-03-30 NOTE — Addendum Note (Signed)
Addended byConrad Emmaus D on: 03/30/2023 09:11 AM   Modules accepted: Orders

## 2023-03-30 NOTE — Telephone Encounter (Signed)
Pt called back to go over labs.  

## 2023-03-30 NOTE — Procedures (Signed)
Interventional Radiology Procedure Note  Procedure:  US guided right thyroid nodule FNA  Complications: None Recommendations:  - Ok to shower tomorrow - Do not submerge for 7 days - Routine care   Signed,  Yvone Neu. Loreta Ave, DO

## 2023-03-30 NOTE — Telephone Encounter (Signed)
See results notes. 

## 2023-03-30 NOTE — Telephone Encounter (Signed)
Heather from [facility] wanted to let us know that the pt is already est with them and he has an appt in January. She would like to know if it was something more urgent or we were just not aware he was already est. Please advise the office.

## 2023-03-30 NOTE — Telephone Encounter (Signed)
Noted  

## 2023-04-02 ENCOUNTER — Encounter: Payer: Self-pay | Admitting: Pharmacist

## 2023-04-02 ENCOUNTER — Other Ambulatory Visit: Payer: Self-pay | Admitting: Internal Medicine

## 2023-04-02 ENCOUNTER — Other Ambulatory Visit: Payer: Self-pay | Admitting: Pharmacist

## 2023-04-02 MED ORDER — ATORVASTATIN CALCIUM 40 MG PO TABS: 40.0000 mg | ORAL_TABLET | Freq: Every day | ORAL | 3 refills | Status: DC

## 2023-04-02 NOTE — Telephone Encounter (Signed)
Completed provide portion of [employer] application - forwarding to PCP.

## 2023-04-02 NOTE — Progress Notes (Unsigned)
04/02/2023 Name: Donald Jones MRN: [id number] DOB: [birth year]  Chief Complaint  Patient presents with   Medication Problem    Medication assistance program     Donald Jones is a 77 y.o. year old male who presented for a telephone visit.   They were referred to the pharmacist by their PCP for assistance in managing diabetes and medication access.    Subjective:  Diabetes:   Current medications: Jardiance 10mg  daily; Januvia 100mg  daily, glimepiride 2mg  once a day (started March 28, 2023)   Current medication access support: Januvia - thru [employer] medication assistance program thru 04/17/2023.  We would like to try Ozempic but need to see if he would qualify for medication assistance program first. Patient returned his portion of the [employer] medication assistance program.   Assessed for Jardiance medication assistance program in past but patient's household income was about program cut off.  Same for [employer] / [employer] medication assistance program.  Hyperlipidemia:  Taking atorvastatin 40mg  daily - due refill and needs updated Rx.   Patient also asks about another medication he was taking at night for nerve pain. Looks like he has been prescribed gabapentin 300mg  at night but last refill was 10/2022. He does has active Rx with refills.    Medication Access/Adherence  Current Pharmacy:  [facility] - [place], [place] - [address] [place] [address] Phone: [phone] Fax: [phone]  [id number] - [address] [place] - [address] [place] [address] Phone: [phone] Fax: [phone]   Patient reports affordability concerns with their medications: Yes  Patient reports access/transportation concerns to their pharmacy: No  Patient reports adherence concerns with their medications:  No         Objective:  Lab Results  Component Value Date   HGBA1C 8.7 (H) 03/28/2023    Lab Results   Component Value Date   CREATININE 1.32 02/02/2023   BUN 19 02/02/2023   NA 139 02/02/2023   K 4.2 02/02/2023   CL 101 02/02/2023   CO2 28 02/02/2023    Lab Results  Component Value Date   CHOL 103 06/06/2022   HDL 48.30 06/06/2022   LDLCALC 45 06/06/2022   TRIG 47.0 06/06/2022   CHOLHDL 2 06/06/2022    Medications Reviewed Today     Reviewed by Henrene Pastor, RPH-CPP (Pharmacist) on 04/02/23 at 1057  Med List Status: <None>   Medication Order Taking? Sig Documenting Provider Last Dose Status Informant  Accu-Chek FastClix Lancets MISC [id number] No Use to check blood glucose once a day (Dx: type 2 DM - E 11.9) Paz, Nolon Rod, MD Taking Active   albuterol (VENTOLIN HFA) 108 (90 Base) MCG/ACT inhaler [id number] No Inhale 2 puffs into the lungs every 6 (six) hours as needed for wheezing or shortness of breath. Wanda Plump, MD Taking Active   AMBULATORY Clent Demark MEDICATION [id number] No GI Cocktail - 90 mls of 2% Lidocaine                      90 mls Dicyclomine 10mg /58ml                    270 mls Maalox Take 5-10 ml every 4-6 hours as needed  Patient not taking: Reported on 12/27/2022   Barrett, Joline Salt, PA-C Not Taking Active            Med Note [facility], Hayslee Casebolt B  Thu Feb 16, 2022  4:36 PM) Hasn't used recently   amLODipine (NORVASC) 2.5 MG tablet [id number] No TAKE 1 TABLET (2.5 MG TOTAL) BY MOUTH DAILY. Saguier, Ramon Dredge, PA-C Taking Active   aspirin EC 81 MG tablet [id number] No Take 81 mg by mouth daily. Each morning. Swallow whole. [provider] Taking Active   atorvastatin (LIPITOR) 40 MG tablet [id number] No Take 1 tablet (40 mg total) by mouth at bedtime. Wanda Plump, MD Taking Active   BREO ELLIPTA 100-25 MCG/ACT AEPB [id number] No Inhale 1 puff into the lungs daily. Wanda Plump, MD Taking Active   Caraway Oil-Levomenthol (FDGARD) 25-20.75 MG CAPS [id number] No Take 2 tablets by mouth in the morning and at bedtime.  [provider] Taking Active Self   diclofenac sodium (VOLTAREN) 1 % GEL [id number] No Apply 4 g topically 3 (three) times daily as needed. Wanda Plump, MD Taking Active Self  dicyclomine (BENTYL) 20 MG tablet [id number] No Take 1 tablet (20 mg total) by mouth 3 (three) times daily before meals. Wanda Plump, MD Taking Active   doxycycline (VIBRAMYCIN) 100 MG capsule [id number] No Take 1 capsule (100 mg total) by mouth 2 (two) times daily.  Patient not taking: Reported on 03/28/2023   Lorre Munroe, NP Not Taking Active   empagliflozin (JARDIANCE) 10 MG TABS tablet [id number] No Take 1 tablet (10 mg total) by mouth daily before breakfast. Wanda Plump, MD Taking Active   escitalopram (LEXAPRO) 10 MG tablet [id number] No Take 1 tablet (10 mg total) by mouth daily. Wanda Plump, MD Taking Active   famotidine (PEPCID) 20 MG tablet [id number] No TAKE 1 TABLET BY MOUTH 2 TIMES DAILY. Wanda Plump, MD Taking Active   Flaxseed, Linseed, (FLAXSEED OIL) 1000 MG CAPS [id number] No Take 2,000 mg by mouth daily.  [provider] Taking Active Self  fluticasone (FLONASE) 50 MCG/ACT nasal spray [id number] No Place 1 spray into both nostrils daily. Wanda Plump, MD Taking Active   furosemide (LASIX) 40 MG tablet [id number] No Take 1 tablet (40 mg total) by mouth daily. Wanda Plump, MD Taking Active   gabapentin (NEURONTIN) 300 MG capsule [id number] No Take 1 capsule (300 mg total) by mouth at bedtime. Clayborne Dana, NP Taking Active   glimepiride (AMARYL) 2 MG tablet [id number]  Take 1 tablet (2 mg total) by mouth daily with breakfast. Wanda Plump, MD  Active   glucose blood (ACCU-CHEK GUIDE) test strip [id number] No Use to check blood glucose once a day (Dx: type 2 DM - E 11.9) Wanda Plump, MD Taking Active   hydrocortisone 2.5 % cream [id number] No Apply topically 2 (two) times daily. Wanda Plump, MD Taking Active Self  ketoconazole (NIZORAL) 2 % cream [id number] No Apply 1 Application topically daily. Wanda Plump, MD Taking Active   losartan (COZAAR)  100 MG tablet [id number] No Take 1 tablet (100 mg total) by mouth daily. Wanda Plump, MD Taking Active   meclizine (ANTIVERT) 12.5 MG tablet [id number] No Take 1 tablet (12.5 mg total) by mouth 3 (three) times daily as needed for dizziness. Saguier, Ramon Dredge, PA-C Taking Active            Med Note (CANTER, Vicente Males D   Wed Oct 18, 2022  2:11 PM) PRN  Multiple Vitamin (MULTIVITAMIN) tablet [id number] No Take 1 tablet by mouth daily. [provider] Taking Active Self  omeprazole (PRILOSEC) 20 MG capsule [id number] No Take 1  capsule (20 mg total) by mouth daily. Wanda Plump, MD Taking Active   sildenafil (REVATIO) 20 MG tablet [id number] No TAKE 3 TO 4 TABLETS BY MOUTH AT BEDTIME AS NEEDED. Wanda Plump, MD Taking Active   sitaGLIPtin (JANUVIA) 100 MG tablet [id number] No Take 1 tablet (100 mg total) by mouth daily. Wanda Plump, MD Taking Active            Med Note Clydie Braun, Cindie Laroche Jul 06, 2022  4:13 PM) Approved for [employer] medication assistance program 2024              Assessment/Plan:   Diabetes: Currently uncontrolled - Reviewed goal A1c, goal fasting, and goal 2 hour post prandial glucose - Recommend to continue Januvia, Jardiance and glimepiride.  - Completed provider portion of Ozempic / [employer] application. If approved will stop Januvia.   - Patient denies personal or family history of multiple endocrine neoplasia type 2, medullary thyroid cancer; personal history of pancreatitis or gallbladder disease. - Recommend to check glucose 1 to 2 times per day - Meets financial criteria for Ozmepic patient assistance program through [employer]. Application in process   Medication Management: - Assisted patient in requesting needed refills for gabapentin and losartan - updated Rx for atorvastatin at his pharmacy.    Follow Up Plan: 1 month  Henrene Pastor, PharmD Clinical Pharmacist [facility]

## 2023-04-02 NOTE — Telephone Encounter (Signed)
Spoke with patient. He reports he was taking a medication at night be ha has not had in awhile. He was not sure of the name. It looks like he was taking gabapentin but has not been filled since July 2024 - for 90 DS.  He also is due RF for atorvastatin. Patient endorses he does need this Rx filled. New Rx needed.

## 2023-04-03 ENCOUNTER — Other Ambulatory Visit: Payer: Self-pay | Admitting: Internal Medicine

## 2023-04-03 LAB — CYTOLOGY - NON PAP

## 2023-04-06 ENCOUNTER — Other Ambulatory Visit: Payer: Self-pay | Admitting: Internal Medicine

## 2023-04-16 ENCOUNTER — Ambulatory Visit (INDEPENDENT_AMBULATORY_CARE_PROVIDER_SITE_OTHER): Payer: Medicare Other | Admitting: Podiatry

## 2023-04-16 ENCOUNTER — Encounter: Payer: Self-pay | Admitting: Podiatry

## 2023-04-16 DIAGNOSIS — M79674 Pain in right toe(s): Secondary | ICD-10-CM | POA: Diagnosis not present

## 2023-04-16 DIAGNOSIS — M79675 Pain in left toe(s): Secondary | ICD-10-CM

## 2023-04-16 DIAGNOSIS — B351 Tinea unguium: Secondary | ICD-10-CM

## 2023-04-16 DIAGNOSIS — I872 Venous insufficiency (chronic) (peripheral): Secondary | ICD-10-CM | POA: Diagnosis not present

## 2023-04-16 NOTE — Progress Notes (Signed)
This patient presents to the office with chief complaint of long thick painful nails.  Patient says the nails are painful walking and wearing shoes.  This patient is unable to self treat.  This patient is unable to trim his nails since he is unable to reach his nails.  He is diagnosed with venous stasis.  he presents to the office for preventative foot care services.  General Appearance  Alert, conversant and in no acute stress.  Vascular  Dorsalis pedis and posterior tibial  pulses are absent  bilaterally.  Capillary return is within normal limits  bilaterally. Temperature is within normal limits  bilaterally.  Neurologic  Senn-Weinstein monofilament wire test within normal limits  bilaterally. Muscle power within normal limits bilaterally.  Nails Thick disfigured discolored nails with subungual debris  from hallux to fifth toes bilaterally. No evidence of bacterial infection or drainage bilaterally.  Orthopedic  No limitations of motion  feet .  No crepitus or effusions noted.  No bony pathology or digital deformities noted.  Skin  normotropic skin with no porokeratosis noted bilaterally.  No signs of infections or ulcers noted.     Onychomycosis  Nails  B/L.  Pain in right toes  Pain in left toes  Debridement of nails both feet followed trimming the nails with dremel tool.    RTC 3 months.   Helane Gunther DPM

## 2023-04-24 ENCOUNTER — Encounter: Payer: Self-pay | Admitting: Internal Medicine

## 2023-04-24 ENCOUNTER — Ambulatory Visit: Payer: Medicare Other | Attending: Interventional Cardiology | Admitting: Internal Medicine

## 2023-04-24 VITALS — BP 130/70 | HR 53 | Resp 16 | Ht 65.0 in | Wt 240.4 lb

## 2023-04-24 DIAGNOSIS — I2510 Atherosclerotic heart disease of native coronary artery without angina pectoris: Secondary | ICD-10-CM | POA: Diagnosis not present

## 2023-04-24 DIAGNOSIS — E782 Mixed hyperlipidemia: Secondary | ICD-10-CM

## 2023-04-24 DIAGNOSIS — I10 Essential (primary) hypertension: Secondary | ICD-10-CM

## 2023-04-24 DIAGNOSIS — E119 Type 2 diabetes mellitus without complications: Secondary | ICD-10-CM | POA: Diagnosis not present

## 2023-04-24 NOTE — Progress Notes (Signed)
 Cardiology Office Note   Date:  04/24/2023   ID:  JAMIEN CASANOVA, DOB [birth year], MRN [id number]  PCP:  Amon Aloysius BRAVO, MD   Pt presents for follow up of  CAD    History of Present Illness: Donald Jones is a 78 y.o. male with hx of CP, HTN, T2DM, obesity, HL and chronic GI problems     2019 stress test normal 2021 admitted with atypical CP  Echo LVEF normal  Normal valve function  2021  CCTA   Ca score 59 (33rd percentile age/sex)    Mild plaque in L MA  Mid LAD myocardial bridge      The pt was previously followed by JINNY Reek   Last seen in clinic in 2023      Past Medical History:  Diagnosis Date   Anxiety    Arthritis    hands right   Diabetes mellitus without complication (HCC)    GERD (gastroesophageal reflux disease)    Hypertension    Kidney stones    several episodes.  Last one in 2011.     Obesity    100-lb weight loss since 2011.    Polycythemia    Sleep apnea    on Cpap    Past Surgical History:  Procedure Laterality Date   CHOLECYSTECTOMY     HAND SURGERY  2010   R hand , d/t a injury, 3 surgeries      Current Outpatient Medications  Medication Sig Dispense Refill   Accu-Chek FastClix Lancets MISC Use to check blood glucose once a day (Dx: type 2 DM - E 11.9) 102 each 5   albuterol  (VENTOLIN  HFA) 108 (90 Base) MCG/ACT inhaler Inhale 2 puffs into the lungs every 6 (six) hours as needed for wheezing or shortness of breath. 18 g 5   amLODipine  (NORVASC ) 2.5 MG tablet TAKE 1 TABLET (2.5 MG TOTAL) BY MOUTH DAILY. 30 tablet 3   aspirin  EC 81 MG tablet Take 81 mg by mouth daily. Each morning. Swallow whole.     atorvastatin  (LIPITOR) 40 MG tablet Take 1 tablet (40 mg total) by mouth at bedtime. 100 tablet 3   BREO ELLIPTA  100-25 MCG/ACT AEPB Inhale 1 puff into the lungs daily. 30 each 5   Caraway Oil-Levomenthol (FDGARD) 25-20.75 MG CAPS Take 2 tablets by mouth in the morning and at bedtime.      dicyclomine  (BENTYL ) 20 MG tablet Take 1 tablet (20 mg  total) by mouth 3 (three) times daily before meals. 270 tablet 0   empagliflozin  (JARDIANCE ) 10 MG TABS tablet Take 1 tablet (10 mg total) by mouth daily before breakfast. 90 tablet 1   escitalopram  (LEXAPRO ) 10 MG tablet Take 1 tablet (10 mg total) by mouth daily. 90 tablet 1   famotidine  (PEPCID ) 20 MG tablet TAKE 1 TABLET BY MOUTH 2 TIMES DAILY. 180 tablet 1   Flaxseed, Linseed, (FLAXSEED OIL) 1000 MG CAPS Take 2,000 mg by mouth daily.      fluticasone  (FLONASE ) 50 MCG/ACT nasal spray Place 1 spray into both nostrils daily. 16 g 12   furosemide  (LASIX ) 40 MG tablet Take 1 tablet (40 mg total) by mouth daily. 90 tablet 1   glimepiride  (AMARYL ) 2 MG tablet Take 1 tablet (2 mg total) by mouth daily with breakfast. 90 tablet 0   glucose blood (ACCU-CHEK GUIDE) test strip Use to check blood glucose once a day (Dx: type 2 DM - E 11.9) 100 each 5   ketoconazole  (  NIZORAL ) 2 % cream Apply 1 Application topically daily. 30 g 0   losartan  (COZAAR ) 100 MG tablet Take 1 tablet (100 mg total) by mouth daily. 100 tablet 3   meclizine  (ANTIVERT ) 12.5 MG tablet Take 1 tablet (12.5 mg total) by mouth 3 (three) times daily as needed for dizziness. 30 tablet 0   omeprazole  (PRILOSEC ) 20 MG capsule Take 1 capsule (20 mg total) by mouth daily. 90 capsule 1   sitaGLIPtin  (JANUVIA ) 100 MG tablet Take 1 tablet (100 mg total) by mouth daily. 90 tablet 1   No current facility-administered medications for this visit.    Allergies:   Metformin  and related and Sulfa antibiotics    Social History:  The patient  reports that he has never smoked. He has never used smokeless tobacco. He reports that he does not drink alcohol and does not use drugs.   Family History:  The patient's family history includes Alcohol abuse in his sister; Colon cancer (age of onset: 30) in his sister; Coronary artery disease (age of onset: 62) in his mother; Diabetes in his mother; Heart disease in his sister; Heart failure in his brother and  sister; Hypertension in his father; Stroke in his father.    ROS:  Please see the history of present illness.   Otherwise, review of systems are positive for knee pain.   All other systems are reviewed and negative.    PHYSICAL EXAM: VS:  BP 130/70 (BP Location: Left Arm, Patient Position: Sitting, Cuff Size: Large)   Pulse (!) 53   Resp 16   Ht 5' 5 (1.651 m)   Wt 240 lb 6.4 oz (109 kg)   SpO2 97%   BMI 40.00 kg/m  , BMI Body mass index is 40 kg/m. GEN: Morbidly obese 78 yo  in no acute distress HEENT: normal Neck: no JVD, carotid bruit Cardiac: RRR; no murmurs,  1+ LE edema   Respiratory:  clear to auscultation bilaterally GI: soft, obese  Nontender  MS: no deformity or atrophy  EKG:   SB 53 bpm     Recent Labs: 06/06/2022: TSH 0.34 09/12/2022: ALT 21; Hemoglobin 13.8; Platelets 189.0 02/02/2023: BUN 19; Creatinine, Ser 1.32; Potassium 4.2; Sodium 139   Lipid Panel    Component Value Date/Time   CHOL 103 06/06/2022 0929   TRIG 47.0 06/06/2022 0929   HDL 48.30 06/06/2022 0929   CHOLHDL 2 06/06/2022 0929   VLDL 9.4 06/06/2022 0929   LDLCALC 45 06/06/2022 0929   LDLCALC 49 08/06/2020 1406     Other studies Reviewed: Additional studies/ records that were reviewed today with results demonstrating: labs reviewed.  ASSESSMENT AND PLAN:  CAD  Pt with mild CAD by CCTA in 2021  Also a myocardial bridge (LAD)    Doing fairly well   Does have some mild SOB with activity  FOllow   I am not convinced of active  angina     Continue risk factor modification  2  HL   Last lipids in Feb 2024 LDL 45  HDL 48  Trig 47   Continue current regimen   3  HTN  Good control  4  T2DM   A1C was 8.7 in Dec 2024   Reviewed diet  Cut out carbs   5 Obesity  Reviewed diet     Follow up next winter Orders Placed This Encounter  Procedures   EKG 12-Lead    Recommend 150 minutes/week of aerobic exercise Low fat, low carb, high fiber diet  recommended  Disposition:   FU in 1  year   Signed, Vina Gull, MD  04/24/2023 12:13 PM    [facility] [address] [place], [place], [place]  [address] Phone: [phone]; Fax: [phone]

## 2023-04-24 NOTE — Patient Instructions (Signed)
 Medication Instructions:   *If you need a refill on your cardiac medications before your next appointment, please call your pharmacy*   Lab Work:  If you have labs (blood work) drawn today and your tests are completely normal, you will receive your results only by: MyChart Message (if you have MyChart) OR A paper copy in the mail If you have any lab test that is abnormal or we need to change your treatment, we will call you to review the results.   Testing/Procedures:    Follow-Up: At [facility], you and your health needs are our priority.  As part of our continuing mission to provide you with exceptional heart care, we have created designated Provider Care Teams.  These Care Teams include your primary Cardiologist (physician) and Advanced Practice Providers (APPs -  Physician Assistants and Nurse Practitioners) who all work together to provide you with the care you need, when you need it.  We recommend signing up for the patient portal called "MyChart".  Sign up information is provided on this After Visit Summary.  MyChart is used to connect with patients for Virtual Visits (Telemedicine).  Patients are able to view lab/test results, encounter notes, upcoming appointments, etc.  Non-urgent messages can be sent to your provider as well.   To learn more about what you can do with MyChart, go to [email or web address].    Your next appointment:  Dec 2025

## 2023-05-02 ENCOUNTER — Ambulatory Visit (INDEPENDENT_AMBULATORY_CARE_PROVIDER_SITE_OTHER): Payer: Medicare Other | Admitting: Pharmacist

## 2023-05-02 ENCOUNTER — Telehealth: Payer: Medicare Other

## 2023-05-02 DIAGNOSIS — E11620 Type 2 diabetes mellitus with diabetic dermatitis: Secondary | ICD-10-CM

## 2023-05-02 NOTE — Progress Notes (Signed)
 05/02/2023 Name: Donald Jones MRN: [id number] DOB: Oct 22, 1945  Chief Complaint  Patient presents with   Medication Management   Diabetes    Donald Jones is a 78 y.o. year old male who presented for a telephone visit.   They were referred to the pharmacist by their PCP for assistance in managing diabetes and medication access.    Subjective:  Diabetes:   Current medications: Jardiance  10mg  daily; Januvia  100mg  daily, glimepiride  2mg  once a day (started March 28, 2023)   Current medication access support: Januvia  - thru [employer] medication assistance program thru 04/17/2023.  However patient states he ran out 3 days ago and has not requested a refill.  He has been approved to receive Ozempic  in 2025 form [name] - first fill is in process. Expect to receive first delivery around 05/08/2023.   Assessed for Jardiance  medication assistance program in past but patient's household income was about program cut off. Same for Breo / [employer] medication assistance program.  Patient mentions that he is having more gas and bloating and is wondering if it is related to glimepiride  or other medications. He states mostly occurs after eating red meat. He mentions that he has IBS and his sister had colon cancer. His last colonoscopy was 2019. He states he received a letter from GI to make an appointment but he has not made appointment yet (looks like letter was sent from [facility] June 2024).   Hyperlipidemia:  Taking atorvastatin  40mg  daily    Medication Access/Adherence  Current Pharmacy:  [facility] - [place], [place] - [address] [place] [address] Phone: [phone] Fax: [phone]  [id number] - [place], [place] - [address] [place] [address] Phone: [phone] Fax: [phone]   Patient reports affordability concerns with their medications: Yes  Patient reports access/transportation concerns to  their pharmacy: No  Patient reports adherence concerns with their medications:  No     Per Epic refill records patient should be due to refill amlodpine. He reports that he is still taking every day and he has plenty of amlodipine  at home.     Objective:  Lab Results  Component Value Date   HGBA1C 8.7 (H) 03/28/2023    Lab Results  Component Value Date   CREATININE 1.32 02/02/2023   BUN 19 02/02/2023   NA 139 02/02/2023   K 4.2 02/02/2023   CL 101 02/02/2023   CO2 28 02/02/2023    Lab Results  Component Value Date   CHOL 103 06/06/2022   HDL 48.30 06/06/2022   LDLCALC 45 06/06/2022   TRIG 47.0 06/06/2022   CHOLHDL 2 06/06/2022    Medications Reviewed Today     Reviewed by Cecilie Coffee, RPH-CPP (Pharmacist) on 05/02/23 at 1029  Med List Status: <None>   Medication Order Taking? Sig Documenting Provider Last Dose Status Informant  Accu-Chek FastClix Lancets MISC [id number] Yes Use to check blood glucose once a day (Dx: type 2 DM - E 11.9) Paz, Anitra Ket, MD Taking Active   albuterol  (VENTOLIN  HFA) 108 (90 Base) MCG/ACT inhaler [id number] Yes Inhale 2 puffs into the lungs every 6 (six) hours as needed for wheezing or shortness of breath. Paz, Jose E, MD Taking Active   amLODipine  (NORVASC ) 2.5 MG tablet [id number] Yes TAKE 1 TABLET (2.5 MG TOTAL) BY MOUTH DAILY. Saguier, Gaylin Ke, PA-C Taking Active   aspirin  EC 81 MG tablet [id number] Yes Take 81 mg by mouth daily.  Each morning. Swallow whole. [provider] Taking Active   atorvastatin  (LIPITOR) 40 MG tablet [id number] Yes Take 1 tablet (40 mg total) by mouth at bedtime. Paz, Jose E, MD Taking Active   BREO ELLIPTA  100-25 MCG/ACT AEPB [id number] Yes Inhale 1 puff into the lungs daily. Ezell Hollow, MD Taking Active   Caraway Oil-Levomenthol (FDGARD) 25-20.75 MG CAPS [id number] No Take 2 tablets by mouth in the morning and at bedtime.  [provider] Unknown Active Self  dicyclomine  (BENTYL ) 20 MG tablet [id number]  Yes Take 1 tablet (20 mg total) by mouth 3 (three) times daily before meals. Paz, Jose E, MD Taking Active   empagliflozin  (JARDIANCE ) 10 MG TABS tablet [id number] Yes Take 1 tablet (10 mg total) by mouth daily before breakfast. Paz, Jose E, MD Taking Active   escitalopram  (LEXAPRO ) 10 MG tablet [id number] Yes Take 1 tablet (10 mg total) by mouth daily. Ezell Hollow, MD Taking Active   famotidine  (PEPCID ) 20 MG tablet [id number] Yes TAKE 1 TABLET BY MOUTH 2 TIMES DAILY. Ezell Hollow, MD Taking Active   Flaxseed, Linseed, (FLAXSEED OIL) 1000 MG CAPS [id number] Yes Take 2,000 mg by mouth daily.  [provider] Taking Active Self  fluticasone  (FLONASE ) 50 MCG/ACT nasal spray [id number] Yes Place 1 spray into both nostrils daily. Ezell Hollow, MD Taking Active   furosemide  (LASIX ) 40 MG tablet [id number] Yes Take 1 tablet (40 mg total) by mouth daily. Paz, Jose E, MD Taking Active   glimepiride  (AMARYL ) 2 MG tablet [id number] Yes Take 1 tablet (2 mg total) by mouth daily with breakfast. Ezell Hollow, MD Taking Active   glucose blood (ACCU-CHEK GUIDE) test strip [id number] Yes Use to check blood glucose once a day (Dx: type 2 DM - E 11.9) Paz, Jose E, MD Taking Active   ketoconazole  (NIZORAL ) 2 % cream [id number] Yes Apply 1 Application topically daily. Ezell Hollow, MD Taking Active   losartan  (COZAAR ) 100 MG tablet [id number] Yes Take 1 tablet (100 mg total) by mouth daily. Paz, Jose E, MD Taking Active   meclizine  (ANTIVERT ) 12.5 MG tablet [id number] Yes Take 1 tablet (12.5 mg total) by mouth 3 (three) times daily as needed for dizziness. Saguier, Gaylin Ke, PA-C Taking Active            Med Note (CANTER, KAYLYN D   Wed Oct 18, 2022  2:11 PM) PRN  omeprazole  (PRILOSEC ) 20 MG capsule [id number] Yes Take 1 capsule (20 mg total) by mouth daily. Ezell Hollow, MD Taking Active   sitaGLIPtin  (JANUVIA ) 100 MG tablet [id number] No Take 1 tablet (100 mg total) by mouth daily.  Patient not taking: Reported on 05/02/2023    Ezell Hollow, MD Not Taking Active            Med Note Alida Ion, Loree Roe Jul 06, 2022  4:13 PM) Approved for [employer] medication assistance program 2024              Assessment/Plan:   Diabetes: Currently uncontrolled - Reviewed goal A1c, goal fasting, and goal 2 hour post prandial glucose - Recommend to continue Jardiance  and glimepiride .  - Since patient has been approved to receive Ozempic  form [name] we will start Ozempic  0.25mg  weekly in place of Januvia . Set up appointment for Wednesday, January 23rd for Ozempic  start and teach him how to inject Ozempic .   - Patient denies personal or family history of multiple endocrine neoplasia type 2, medullary thyroid   cancer; personal history of pancreatitis or gallbladder disease. - Recommend to check glucose 1 to 2 times per day - Meets financial criteria for Ozmepic patient assistance program through [name]. Application approved thry 04/16/2024.   Medication Management: -   Health Maintenance:  - Encouraged patient to contact GI office to make appointment to follow up gas / bloating and discuss if colonoscopy is needed.   Follow Up Plan: 05/10/2023  Cecilie Coffee, PharmD Clinical Pharmacist [facility]

## 2023-05-09 ENCOUNTER — Ambulatory Visit: Payer: Medicare Other

## 2023-05-09 ENCOUNTER — Telehealth: Payer: Self-pay | Admitting: Internal Medicine

## 2023-05-09 NOTE — Telephone Encounter (Signed)
Patient called and rescheduled appointment for Monday 05/14/2023.

## 2023-05-09 NOTE — Telephone Encounter (Signed)
Copied from CRM [id number]. Topic: General - Other >> May 09, 2023  8:45 AM Adele Barthel wrote: Reason for CRM: Patient needs to reschedule his pharmacist appointment today scheduled at 12 PM, due to weather conditions. CB# [phone]

## 2023-05-14 ENCOUNTER — Ambulatory Visit: Payer: Medicare Other | Admitting: Pharmacist

## 2023-05-14 DIAGNOSIS — E119 Type 2 diabetes mellitus without complications: Secondary | ICD-10-CM

## 2023-05-14 DIAGNOSIS — Z7984 Long term (current) use of oral hypoglycemic drugs: Secondary | ICD-10-CM | POA: Diagnosis not present

## 2023-05-14 DIAGNOSIS — E11620 Type 2 diabetes mellitus with diabetic dermatitis: Secondary | ICD-10-CM | POA: Diagnosis not present

## 2023-05-14 DIAGNOSIS — I10 Essential (primary) hypertension: Secondary | ICD-10-CM

## 2023-05-14 DIAGNOSIS — Z7985 Long-term (current) use of injectable non-insulin antidiabetic drugs: Secondary | ICD-10-CM | POA: Diagnosis not present

## 2023-05-14 MED ORDER — AMLODIPINE BESYLATE 2.5 MG PO TABS: 2.5000 mg | ORAL_TABLET | Freq: Every day | ORAL | 0 refills | Status: DC

## 2023-05-14 NOTE — Progress Notes (Signed)
05/14/2023 Name: Donald Jones MRN: [id number] DOB: 05-Sep-1945  Chief Complaint  Patient presents with   Diabetes   Medication Management    Donald Jones is a 78 y.o. year old male who presented for an office visit.    They were referred to the pharmacist by their PCP for assistance in managing diabetes and medication access.    Subjective:  Diabetes:   Current medications: Jardiance 10mg  daily; Januvia 100mg  daily, glimepiride 2mg  once a day (started March 28, 2023) He has been approved to receive Ozempic form [employer] medication assistance program but first delivery has not arrived yet. Ozempic would replace Januvia.   Recent home blood glucose readings - 90 to 125 since starting glimepiride.    Current medication access support: Approved to receive Ozempic in 2025 form [employer] - first fill is in process. Expect to receive first delivery around 05/08/2023.   Assessed for Jardiance medication assistance program in past but patient's household income was about program cut off today patient states he might qualify.    Hyperlipidemia:  Taking atorvastatin 40mg  daily    Medication Access/Adherence  Current Pharmacy:  [facility] - [place], [place] - [address] [place] [address] Phone: [phone] Fax: [phone]  [id number] - [place],  - [address] [address] Phone: [phone] Fax: [phone]   Patient reports affordability concerns with their medications: Yes  Patient reports access/transportation concerns to their pharmacy: No  Patient reports adherence concerns with their medications:  No     Per Epic refill records patient should be due to refill amlodpine 2.5mg . He reports that he is still taking every da.    Objective:  Lab Results  Component Value Date   HGBA1C 8.7 (H) 03/28/2023    Lab Results  Component Value Date   CREATININE 1.32 02/02/2023    BUN 19 02/02/2023   NA 139 02/02/2023   K 4.2 02/02/2023   CL 101 02/02/2023   CO2 28 02/02/2023    Lab Results  Component Value Date   CHOL 103 06/06/2022   HDL 48.30 06/06/2022   LDLCALC 45 06/06/2022   TRIG 47.0 06/06/2022   CHOLHDL 2 06/06/2022    Medications Reviewed Today   Medications were not reviewed in this encounter       Assessment/Plan:   Diabetes: Currently uncontrolled - Reviewed goal A1c, goal fasting, and goal 2 hour post prandial glucose - Recommend to continue Jardiance and glimepiride.  - Since patient has been approved to receive Ozempic from [employer], provided a sample - start Ozempic 0.25mg  weekly for 4 weeks, then increase to 0.5mg  thereafter. First dose given in office today. Provided injection technique counseling as well as storage and side effect for Ozempic - Stop Januvia. .   - Patient denies personal or family history of multiple endocrine neoplasia type 2, medullary thyroid cancer; personal history of pancreatitis or gallbladder disease. - Recommend to check glucose 1 to 2 times per day - Meets financial criteria for Ozmepic patient assistance program through [employer]. Application approved thry 04/16/2024.   Medication Management: - Provided information to patient about his 2025 Medicare Plan. Cost of Jardiance and Virgel Bouquet initially would be high due to $255 deductible but after he reaches deductible cost of each per month would be $47. Reviewed Medicare payment plan that he can request to enroll in. First month cost would be about $180 and then around $70 per month.  Patient will call to discuss further with [facility].    Follow Up Plan: 3 weeks.   Donald Jones, PharmD Clinical Pharmacist [facility]

## 2023-05-15 ENCOUNTER — Telehealth: Payer: Self-pay

## 2023-05-15 NOTE — Telephone Encounter (Signed)
LMOM informing Pt assistance Ozempic has arrived. Ready for pick up at his convenience.

## 2023-05-30 ENCOUNTER — Ambulatory Visit (INDEPENDENT_AMBULATORY_CARE_PROVIDER_SITE_OTHER): Payer: Medicare Other | Admitting: Pharmacist

## 2023-05-30 DIAGNOSIS — Z7985 Long-term (current) use of injectable non-insulin antidiabetic drugs: Secondary | ICD-10-CM

## 2023-05-30 DIAGNOSIS — E11620 Type 2 diabetes mellitus with diabetic dermatitis: Secondary | ICD-10-CM

## 2023-05-30 DIAGNOSIS — E119 Type 2 diabetes mellitus without complications: Secondary | ICD-10-CM

## 2023-05-30 MED ORDER — BREO ELLIPTA 100-25 MCG/ACT IN AEPB: 1.0000 | INHALATION_SPRAY | Freq: Every day | RESPIRATORY_TRACT | 2 refills | Status: DC

## 2023-05-30 NOTE — Progress Notes (Signed)
05/30/2023 Name: Donald Jones MRN: [id number] DOB: [birth year]  Chief Complaint  Patient presents with   Diabetes    Donald Jones is a 78 y.o. year old male who presented for an office visit.    They were referred to the pharmacist by their PCP for assistance in managing diabetes and medication access.    Subjective:  Diabetes:   Current medications: Jardiance 10mg  daily; Ozempic 0.25mg  once a week (started 05/14/2023); glimepiride 2mg  once a day (started March 28, 2023) He has been approved to receive Ozempic from [employer] medication assistance program received first delivery 05/15/2023.   Recent home blood glucose readings - 90 to 140 since starting glimepiride.    Current medication access support: Approved to receive Ozempic in 2025 form [employer].  Patient will see Dr Roanna Raider - he is seeing her for thyroid nodule. Biopsy completed 03/2023. FINAL MICROSCOPIC DIAGNOSIS:  - Benign follicular nodule ([name]  He has appointment next Friday 06/08/2023 to follow up with Dr Roanna Raider.   Assessed for Jardiance medication assistance program in past but patient's household income was about program cut off today patient states he might qualify.   He denies nausea or abdominal pain. He has not notice a big change in appetite yet.  Weight this morning 236 lbs.   Wt Readings from Last 3 Encounters:  04/24/23 240 lb 6.4 oz (109 kg)  03/28/23 241 lb (109.3 kg)  12/27/22 241 lb 9.6 oz (109.6 kg)    Hyperlipidemia:  Taking atorvastatin 40mg  daily    Medication Access/Adherence  Current Pharmacy:  [facility] - [place], [place] - [address] [place] [address] Phone: [phone] Fax: [phone]  [id number] - [address], [place] - [address] [address] Phone: [phone] Fax: [phone]   Patient reports affordability concerns with their medications: Yes  Patient reports  access/transportation concerns to their pharmacy: No  Patient reports adherence concerns with their medications:  No     Per Epic refill records patient should be due to refill amlodpine 2.5mg . He reports that he is still taking every da.    Objective:  Lab Results  Component Value Date   HGBA1C 8.7 (H) 03/28/2023    Lab Results  Component Value Date   CREATININE 1.32 02/02/2023   BUN 19 02/02/2023   NA 139 02/02/2023   K 4.2 02/02/2023   CL 101 02/02/2023   CO2 28 02/02/2023    Lab Results  Component Value Date   CHOL 103 06/06/2022   HDL 48.30 06/06/2022   LDLCALC 45 06/06/2022   TRIG 47.0 06/06/2022   CHOLHDL 2 06/06/2022    Medications Reviewed Today   Medications were not reviewed in this encounter       Assessment/Plan:   Diabetes: Currently uncontrolled - Reviewed goal A1c, goal fasting, and goal 2 hour post prandial glucose - Recommend to continue Jardiance and glimepiride.  - Continue Ozempic 0.25mg  weekly for 1 more dose, then increase to 0.5mg  thereafter.    - Patient denies personal or family history of multiple endocrine neoplasia type 2, medullary thyroid cancer; personal history of pancreatitis or gallbladder disease. - Recommend to check glucose 1 to 2 times per day - Meets financial criteria for Ozmepic patient assistance program through [employer]. Application approved thry 04/16/2024.   Medication Management: - Patient requested updated Rx for Breo.   Meds ordered this encounter  Medications   BREO ELLIPTA 100-25 MCG/ACT  AEPB    Sig: Inhale 1 puff into the lungs daily.    Dispense:  60 each    Refill:  2    Brand Virgel Bouquet is preferred per patient [facility] plan for 2024. Should be tier 3 $47/month.      Follow Up Plan: 4 to 6 weeks.   Henrene Pastor, PharmD Clinical Pharmacist [facility]

## 2023-05-30 NOTE — Progress Notes (Signed)
I have personally reviewed this encounter including the documentation in this note and have collaborated with the care management provider regarding care management and care coordination activities to include development and update of the comprehensive care plan. I am certifying that I agree with the content of this note and encounter as supervising physician.  [name], MD

## 2023-06-11 DIAGNOSIS — R17 Unspecified jaundice: Secondary | ICD-10-CM | POA: Diagnosis not present

## 2023-06-11 DIAGNOSIS — E041 Nontoxic single thyroid nodule: Secondary | ICD-10-CM | POA: Diagnosis not present

## 2023-06-11 DIAGNOSIS — E1165 Type 2 diabetes mellitus with hyperglycemia: Secondary | ICD-10-CM | POA: Diagnosis not present

## 2023-06-11 DIAGNOSIS — E0590 Thyrotoxicosis, unspecified without thyrotoxic crisis or storm: Secondary | ICD-10-CM | POA: Diagnosis not present

## 2023-06-11 DIAGNOSIS — I10 Essential (primary) hypertension: Secondary | ICD-10-CM | POA: Diagnosis not present

## 2023-06-11 DIAGNOSIS — N183 Chronic kidney disease, stage 3 unspecified: Secondary | ICD-10-CM | POA: Diagnosis not present

## 2023-06-11 LAB — BASIC METABOLIC PANEL
BUN: 20 (ref 4–21)
CO2: 29 — AB (ref 13–22)
Chloride: 102 (ref 99–108)
Creatinine: 1.4 — AB (ref 0.6–1.3)
Glucose: 137
Potassium: 4.6 meq/L (ref 3.5–5.1)
Sodium: 138 (ref 137–147)

## 2023-06-11 LAB — HEPATIC FUNCTION PANEL
ALT: 45 U/L — AB (ref 10–40)
AST: 34 (ref 14–40)
Alkaline Phosphatase: 89 (ref 25–125)
Bilirubin, Total: 1.6

## 2023-06-11 LAB — TSH: TSH: 0.65 (ref 0.41–5.90)

## 2023-06-11 LAB — COMPREHENSIVE METABOLIC PANEL
Albumin: 4.1 (ref 3.5–5.0)
Calcium: 10.2 (ref 8.7–10.7)
eGFR: 53

## 2023-06-18 ENCOUNTER — Encounter: Payer: Self-pay | Admitting: Adult Health

## 2023-06-18 ENCOUNTER — Ambulatory Visit: Payer: Medicare Other | Admitting: Adult Health

## 2023-06-18 VITALS — BP 138/74 | HR 55 | Temp 98.0°F | Ht 65.0 in | Wt 240.0 lb

## 2023-06-18 DIAGNOSIS — G4733 Obstructive sleep apnea (adult) (pediatric): Secondary | ICD-10-CM | POA: Diagnosis not present

## 2023-06-18 NOTE — Patient Instructions (Addendum)
 Continue on CPAP At bedtime  Change CPAP pressure to 12 to 16cmH2O .  CPAP download in 4 weeks  Keep up good work.  Work on healthy weight loss.  Saline nasal spray Twice daily   Saline nasal gel Twice daily  .  Follow up for virtual visit in 6-8 weeks and As needed

## 2023-06-18 NOTE — Progress Notes (Signed)
 @Patient  ID: Donald Jones, male    DOB: 1946/01/24, 78 y.o.   MRN: [id number]  Chief Complaint  Patient presents with   Follow-up    Referring provider: Wanda Plump, MD  HPI: 78 yo male followed for OSA (Dx 2003) on CPAP  Medical history significant for HTN, DM, and Asthma    TEST/EVENTS :  DME -[facility]   06/18/2023 Follow up : OSA Patient returns for a 1 year follow-up.  Patient has underlying sleep apnea is on nocturnal CPAP.  Patient says he is doing well on CPAP.  Wears it every single night.  Feels that he benefits from CPAP with decreased daytime sleepiness.  Uses a fullface mask.  CPAP download shows excellent compliance with daily average usage at 6.5 hours.  Patient is on auto CPAP 8 to 15 cm H2O.  AHI 9.6/hour with daily average pressure of 12.9 cm H2O.  Denies significant daytime sleepiness.   Allergies  Allergen Reactions   Metformin And Related     Intolerant, GI symptoms   Sulfa Antibiotics Nausea And Vomiting    Immunization History  Administered Date(s) Administered   Fluad Quad(high Dose 65+) 01/10/2019, 01/23/2020, 02/03/2022   Fluad Trivalent(High Dose 65+) 12/27/2022   Influenza Split 01/17/2011, 01/16/2012   Influenza Whole 02/18/2007   Influenza, High Dose Seasonal PF 01/22/2015, 02/03/2016, 01/29/2017, 01/09/2018   Influenza,inj,Quad PF,6+ Mos 02/05/2013, 01/27/2014, 03/28/2019   Influenza-Unspecified 01/15/2021   PFIZER Comirnaty(Gray Top)Covid-19 Tri-Sucrose Vaccine 09/24/2020   PFIZER(Purple Top)SARS-COV-2 Vaccination 04/12/2019, 06/03/2019, 04/07/2020   PNEUMOCOCCAL CONJUGATE-20 02/03/2022   Pfizer(Comirnaty)Fall Seasonal Vaccine 12 years and older 06/09/2022, 03/02/2023   Pneumococcal Conjugate-13 01/22/2015   Pneumococcal Polysaccharide-23 01/16/2003, 12/03/2012   Respiratory Syncytial Virus Vaccine,Recomb Aduvanted(Arexvy) 02/06/2022   Tdap 12/03/2012, 05/22/2016   Zoster Recombinant(Shingrix) 01/29/2018, 05/23/2018   Zoster, Live  02/09/2010    Past Medical History:  Diagnosis Date   Anxiety    Arthritis    hands right   Diabetes mellitus without complication (HCC)    GERD (gastroesophageal reflux disease)    Hypertension    Kidney stones    several episodes.  Last one in 2011.     Obesity    100-lb weight loss since 2011.    Polycythemia    Sleep apnea    on Cpap    Tobacco History: Social History   Tobacco Use  Smoking Status Never  Smokeless Tobacco Never   Counseling given: Not Answered   Outpatient Medications Prior to Visit  Medication Sig Dispense Refill   Accu-Chek FastClix Lancets MISC Use to check blood glucose once a day (Dx: type 2 DM - E 11.9) 102 each 5   albuterol (VENTOLIN HFA) 108 (90 Base) MCG/ACT inhaler Inhale 2 puffs into the lungs every 6 (six) hours as needed for wheezing or shortness of breath. 18 g 5   amLODipine (NORVASC) 2.5 MG tablet Take 1 tablet (2.5 mg total) by mouth daily. 90 tablet 0   aspirin EC 81 MG tablet Take 81 mg by mouth daily. Each morning. Swallow whole.     atorvastatin (LIPITOR) 40 MG tablet Take 1 tablet (40 mg total) by mouth at bedtime. 100 tablet 3   BREO ELLIPTA 100-25 MCG/ACT AEPB Inhale 1 puff into the lungs daily. 60 each 2   Caraway Oil-Levomenthol (FDGARD) 25-20.75 MG CAPS Take 2 tablets by mouth in the morning and at bedtime.      dicyclomine (BENTYL) 20 MG tablet Take 1 tablet (20 mg total) by mouth 3 (three)  times daily before meals. 270 tablet 0   empagliflozin (JARDIANCE) 10 MG TABS tablet Take 1 tablet (10 mg total) by mouth daily before breakfast. 90 tablet 1   escitalopram (LEXAPRO) 10 MG tablet Take 1 tablet (10 mg total) by mouth daily. 90 tablet 1   famotidine (PEPCID) 20 MG tablet TAKE 1 TABLET BY MOUTH 2 TIMES DAILY. 180 tablet 1   Flaxseed, Linseed, (FLAXSEED OIL) 1000 MG CAPS Take 2,000 mg by mouth daily.      fluticasone (FLONASE) 50 MCG/ACT nasal spray Place 1 spray into both nostrils daily. 16 g 12   furosemide (LASIX) 40 MG  tablet Take 1 tablet (40 mg total) by mouth daily. 90 tablet 1   glimepiride (AMARYL) 2 MG tablet Take 1 tablet (2 mg total) by mouth daily with breakfast. 90 tablet 0   glucose blood (ACCU-CHEK GUIDE) test strip Use to check blood glucose once a day (Dx: type 2 DM - E 11.9) 100 each 5   ketoconazole (NIZORAL) 2 % cream Apply 1 Application topically daily. 30 g 0   losartan (COZAAR) 100 MG tablet Take 1 tablet (100 mg total) by mouth daily. 100 tablet 3   meclizine (ANTIVERT) 12.5 MG tablet Take 1 tablet (12.5 mg total) by mouth 3 (three) times daily as needed for dizziness. 30 tablet 0   omeprazole (PRILOSEC) 20 MG capsule Take 1 capsule (20 mg total) by mouth daily. 90 capsule 1   Semaglutide,0.25 or 0.5MG /DOS, (OZEMPIC, 0.25 OR 0.5 MG/DOSE,) 2 MG/3ML SOPN Inject 0.25 mg Subcutaneous once weekly     No facility-administered medications prior to visit.     Review of Systems:   Constitutional:   No  weight loss, night sweats,  Fevers, chills, fatigue, or  lassitude.  HEENT:   No headaches,  Difficulty swallowing,  Tooth/dental problems, or  Sore throat,                No sneezing, itching, ear ache, nasal congestion, post nasal drip,   CV:  No chest pain,  Orthopnea, PND, swelling in lower extremities, anasarca, dizziness, palpitations, syncope.   GI  No heartburn, indigestion, abdominal pain, nausea, vomiting, diarrhea, change in bowel habits, loss of appetite, bloody stools.   Resp: No shortness of breath with exertion or at rest.  No excess mucus, no productive cough,  No non-productive cough,  No coughing up of blood.  No change in color of mucus.  No wheezing.  No chest wall deformity  Skin: no rash or lesions.  GU: no dysuria, change in color of urine, no urgency or frequency.  No flank pain, no hematuria   MS:  No joint pain or swelling.  No decreased range of motion.  No back pain.    Physical Exam  BP 138/74 (BP Location: Left Arm, Patient Position: Sitting)   Pulse (!)  55   Temp 98 F (36.7 C) (Oral)   Ht 5\' 5"  (1.651 m)   Wt 240 lb (108.9 kg)   SpO2 98%   BMI 39.94 kg/m   GEN: A/Ox3; pleasant , NAD, well nourished    HEENT:  [place]/AT,  NOSE-clear, THROAT-clear, no lesions, no postnasal drip or exudate noted.   NECK:  Supple w/ fair ROM; no JVD; normal carotid impulses w/o bruits; no thyromegaly or nodules palpated; no lymphadenopathy.    RESP  Clear  P & A; w/o, wheezes/ rales/ or rhonchi. no accessory muscle use, no dullness to percussion  CARD:  RRR, no m/r/g, no  peripheral edema, pulses intact, no cyanosis or clubbing.  GI:   Soft & nt; nml bowel sounds; no organomegaly or masses detected.   Musco: Warm bil, no deformities or joint swelling noted.   Neuro: alert, no focal deficits noted.    Skin: Warm, no lesions or rashes    Lab Results:  CBC    Component Value Date/Time   WBC 6.0 09/12/2022 1114   RBC 4.39 09/12/2022 1114   HGB 13.8 09/12/2022 1114   HGB 12.3 (L) 10/27/2014 1125   HGB 17.3 (H) 09/01/2013 1429   HCT 42.2 09/12/2022 1114   HCT 38.9 10/27/2014 1125   HCT 49.0 09/01/2013 1429   PLT 189.0 09/12/2022 1114   PLT 256 10/27/2014 1125   PLT 204 09/01/2013 1429   MCV 96.3 09/12/2022 1114   MCV 76 (L) 10/27/2014 1125   MCV 89.7 09/01/2013 1429   MCH 30.6 08/06/2020 1406   MCHC 32.7 09/12/2022 1114   RDW 14.1 09/12/2022 1114   RDW 16.4 (H) 10/27/2014 1125   RDW 13.0 09/01/2013 1429   LYMPHSABS 1.5 09/12/2022 1114   LYMPHSABS 2.8 10/27/2014 1125   LYMPHSABS 2.1 09/01/2013 1429   MONOABS 0.5 09/12/2022 1114   MONOABS 0.6 09/01/2013 1429   EOSABS 0.2 09/12/2022 1114   EOSABS 0.3 10/27/2014 1125   BASOSABS 0.0 09/12/2022 1114   BASOSABS 0.0 10/27/2014 1125   BASOSABS 0.0 09/01/2013 1429    BMET    Component Value Date/Time   NA 138 06/11/2023 0000   K 4.6 06/11/2023 0000   CL 102 06/11/2023 0000   CO2 29 (A) 06/11/2023 0000   GLUCOSE 158 (H) 02/02/2023 0909   BUN 20 06/11/2023 0000   CREATININE 1.4 (A)  06/11/2023 0000   CREATININE 1.32 02/02/2023 0909   CREATININE 1.24 (H) 08/06/2020 1406   CALCIUM 10.2 06/11/2023 0000   GFRNONAA 52 (L) 08/29/2019 1511   GFRAA 60 08/29/2019 1511    BNP No results found for: "BNP"  ProBNP    Component Value Date/Time   PROBNP 26.0 11/25/2007 2054    Imaging: No results found.  Administration History     None           No data to display          No results found for: "NITRICOXIDE"      Assessment & Plan:   OSA (obstructive sleep apnea) Patient had excellent compliance on CPAP.  Has some residual events.  Will adjust CPAP pressure 12 to 16 cm H2O.  If continues to have significant events will need to consider a in lab CPAP titration study.  Plan Patient Instructions  Continue on CPAP At bedtime  Change CPAP pressure to 12 to 16cmH2O .  CPAP download in 4 weeks  Keep up good work.  Work on healthy weight loss.  Saline nasal spray Twice daily   Saline nasal gel Twice daily  .  Follow up for virtual visit in 6-8 weeks and As needed      Morbid obesity (HCC) Continue to work on healthy weight loss     Rubye Oaks, NP 06/18/2023

## 2023-06-18 NOTE — Assessment & Plan Note (Signed)
 Continue to work on healthy weight loss

## 2023-06-18 NOTE — Assessment & Plan Note (Signed)
 Patient had excellent compliance on CPAP.  Has some residual events.  Will adjust CPAP pressure 12 to 16 cm H2O.  If continues to have significant events will need to consider a in lab CPAP titration study.  Plan Patient Instructions  Continue on CPAP At bedtime  Change CPAP pressure to 12 to 16cmH2O .  CPAP download in 4 weeks  Keep up good work.  Work on healthy weight loss.  Saline nasal spray Twice daily   Saline nasal gel Twice daily  .  Follow up for virtual visit in 6-8 weeks and As needed

## 2023-06-25 ENCOUNTER — Other Ambulatory Visit: Payer: Self-pay | Admitting: Internal Medicine

## 2023-06-26 ENCOUNTER — Encounter: Payer: Self-pay | Admitting: Internal Medicine

## 2023-06-26 ENCOUNTER — Ambulatory Visit (INDEPENDENT_AMBULATORY_CARE_PROVIDER_SITE_OTHER): Payer: Medicare Other | Admitting: Internal Medicine

## 2023-06-26 VITALS — BP 136/80 | HR 69 | Temp 98.3°F | Resp 18 | Ht 65.0 in | Wt 235.4 lb

## 2023-06-26 DIAGNOSIS — Z7985 Long-term (current) use of injectable non-insulin antidiabetic drugs: Secondary | ICD-10-CM

## 2023-06-26 DIAGNOSIS — E119 Type 2 diabetes mellitus without complications: Secondary | ICD-10-CM

## 2023-06-26 DIAGNOSIS — I10 Essential (primary) hypertension: Secondary | ICD-10-CM

## 2023-06-26 NOTE — Progress Notes (Signed)
 Subjective:    Patient ID: Donald Jones, male    DOB: [birth year], 78 y.o.   MRN: [id number]  DOS:  06/26/2023 Type of visit - description: Follow-up  Since the last office visit is doing well.    Review of Systems See above   Past Medical History:  Diagnosis Date   Anxiety    Arthritis    hands right   Diabetes mellitus without complication (HCC)    GERD (gastroesophageal reflux disease)    Hypertension    Kidney stones    several episodes.  Last one in 2011.     Obesity    100-lb weight loss since 2011.    Polycythemia    Sleep apnea    on Cpap    Past Surgical History:  Procedure Laterality Date   CHOLECYSTECTOMY     HAND SURGERY  2010   R hand , d/t a injury, 3 surgeries     Current Outpatient Medications  Medication Instructions   Accu-Chek FastClix Lancets MISC Use to check blood glucose once a day (Dx: type 2 DM - E 11.9)   albuterol (VENTOLIN HFA) 108 (90 Base) MCG/ACT inhaler 2 puffs, Inhalation, Every 6 hours PRN   amLODipine (NORVASC) 2.5 mg, Oral, Daily   aspirin EC 81 mg, Daily   atorvastatin (LIPITOR) 40 mg, Oral, Daily at bedtime   BREO ELLIPTA 100-25 MCG/ACT AEPB 1 puff, Inhalation, Daily   Caraway Oil-Levomenthol (FDGARD) 25-20.75 MG CAPS 2 tablets, 2 times daily   dicyclomine (BENTYL) 20 mg, Oral, 3 times daily before meals   empagliflozin (JARDIANCE) 10 mg, Oral, Daily before breakfast   escitalopram (LEXAPRO) 10 mg, Oral, Daily   famotidine (PEPCID) 20 mg, Oral, 2 times daily   Flaxseed Oil 2,000 mg, Daily   fluticasone (FLONASE) 50 MCG/ACT nasal spray 1 spray, Each Nare, Daily   furosemide (LASIX) 40 mg, Oral, Daily   glimepiride (AMARYL) 2 mg, Oral, Daily with breakfast   glucose blood (ACCU-CHEK GUIDE) test strip Use to check blood glucose once a day (Dx: type 2 DM - E 11.9)   ketoconazole (NIZORAL) 2 % cream 1 Application, Topical, Daily   losartan (COZAAR) 100 mg, Oral, Daily   meclizine (ANTIVERT) 12.5 mg, Oral, 3 times daily PRN    omeprazole (PRILOSEC) 20 mg, Oral, Daily   Semaglutide,0.25 or 0.5MG /DOS, (OZEMPIC, 0.25 OR 0.5 MG/DOSE,) 2 MG/3ML SOPN Inject 0.25 mg Subcutaneous once weekly       Objective:   Physical Exam BP 136/80   Pulse 69   Temp 98.3 F (36.8 C) (Oral)   Resp 18   Ht 5\' 5"  (1.651 m)   Wt 235 lb 6 oz (106.8 kg)   SpO2 96%   BMI 39.17 kg/m  General:   Well developed, NAD, BMI noted. HEENT:  Normocephalic . Face symmetric, atraumatic Lungs:  CTA B Normal respiratory effort, no intercostal retractions, no accessory muscle use. Heart: RRR,  no murmur.  Lower extremities: no pretibial edema bilaterally  Skin: Not pale. Not jaundice Neurologic:  alert & oriented X3.  Speech normal, gait appropriate for age and unassisted Psych--  Cognition and judgment appear intact.  Cooperative with normal attention span and concentration.  Behavior appropriate. No anxious or depressed appearing.      Assessment     ASSESSMENT DM--Intolerant to Metformin, see office visit 09/09/2019; actos d/c 2024 d/t edema  HTN Polycythemia- sees hematology, likely d/t OSA-diuretics, JAK2 (-) ~ 2014, last OV 10-2014, f/u prn OSA on CPAP Asthma  Anxiety ("lexapro prn") DJD- saw Dr Jerl Santos before  R Foot drop  GI: -GERD - IBS? GI sx on and off, previously dx w/ IBS -Cscopes: 4098,1191, 09/18/2017 - EGD 04/2018, showed gastritis, no H. pylori, no malignancy Urolithiasis, several episodes CV: Sees cardiology, negative Myoview 2019, CT coronary angiography 08/2019: Minimal disease Chronic dermatitis: Lower extremities, pretibial Subclinical hyperthyroidism:   thyroid nodules per [place] 05-2021, saw endo,Dr. Roanna Raider, BX 03/2023 benign  PLAN DM: Last A1c 8.7, worse than before.  My advice was to continue Januvia, Jardiance and add glimepiride and see endocrinology. He reports was prescribed Ozempic, currently 5 mg weekly, shortly after the injection he has some GI discomfort but that quickly resolves. Not taking  Januvia. Ambulatory CBGs: This morning it was 90. Fructosamine 243 WNL when she saw endocrinology last month. Plan: Continue Ozempic, Jardiance.  Get Endo notes HTN: Last creatinine is stable, BP today satisfactory.  Continue losartan, furosemide, amlodipine Increased creatinine:   creatinine hovering around 1.2, 1.4. OSA: Good compliance with CPAP  RTC 6 months

## 2023-06-26 NOTE — Assessment & Plan Note (Addendum)
 DM: Last A1c 8.7, worse than before.  My advice was to continue Januvia, Jardiance and add glimepiride and see endocrinology. He reports was prescribed Ozempic, currently 5 mg weekly, shortly after the injection he has some GI discomfort but that quickly resolves. Not taking Januvia. Ambulatory CBGs: This morning it was 90. Fructosamine 243 WNL when she saw endocrinology last month. Plan: Continue Ozempic, Jardiance.  Get Endo notes HTN: Last creatinine is stable, BP today satisfactory.  Continue losartan, furosemide, amlodipine Increased creatinine:   creatinine hovering around 1.2, 1.4. OSA: Good compliance with CPAP  RTC 6 months

## 2023-06-26 NOTE — Patient Instructions (Signed)
  Check the  blood pressure regularly Blood pressure goal:  between 110/65 and  135/85. If it is consistently higher or lower, let me know       Please go to the front desk: Arrange for a office visit in 6 months

## 2023-07-09 ENCOUNTER — Other Ambulatory Visit: Payer: Self-pay | Admitting: Internal Medicine

## 2023-07-09 DIAGNOSIS — R600 Localized edema: Secondary | ICD-10-CM

## 2023-07-11 ENCOUNTER — Ambulatory Visit (INDEPENDENT_AMBULATORY_CARE_PROVIDER_SITE_OTHER): Payer: Medicare Other | Admitting: Pharmacist

## 2023-07-11 DIAGNOSIS — Z7985 Long-term (current) use of injectable non-insulin antidiabetic drugs: Secondary | ICD-10-CM

## 2023-07-11 DIAGNOSIS — I10 Essential (primary) hypertension: Secondary | ICD-10-CM

## 2023-07-11 DIAGNOSIS — E119 Type 2 diabetes mellitus without complications: Secondary | ICD-10-CM

## 2023-07-11 MED ORDER — LOSARTAN POTASSIUM 100 MG PO TABS: 100.0000 mg | ORAL_TABLET | Freq: Every day | ORAL | 3 refills | Status: AC

## 2023-07-11 NOTE — Progress Notes (Signed)
 07/11/2023 Name: Donald Jones MRN: [id number] DOB: 11-10-1945  No chief complaint on file.   Donald Jones is a 78 y.o. year old male who presented for an office visit.    They were referred to the pharmacist by their PCP for assistance in managing diabetes and medication access.    Subjective:  Diabetes:   Current medications: Jardiance 10mg  daily; Ozempic 0.5mg  once a week (started 05/14/2023) He has been approved to receive Ozempic from [employer] medication assistance program received first delivery 05/15/2023.   Recent home blood glucose readings - 91 to 131   Current medication access support: Approved to receive Ozempic in 2025 form [employer].  Patient will see Dr Roanna Raider - he is seeing her for thyroid nodule. Biopsy completed 03/2023. FINAL MICROSCOPIC DIAGNOSIS:  - Benign follicular nodule ([name]  Next appointment with Dr Roanna Raider Aug 22, 2023  Assessed for Jardiance medication assistance program in past but patient's household income was about program cut off today patient states he might qualify.   Some foods causes him to feel more bloated - beans with skins, salads He is taking a stool softener from time to time for constipation  Wt Readings from Last 3 Encounters:  06/26/23 235 lb 6 oz (106.8 kg)  06/18/23 240 lb (108.9 kg)  04/24/23 240 lb 6.4 oz (109 kg)    Hyperlipidemia:  Taking atorvastatin 40mg  daily   Hypertension:  Current medications - losartan 100mg  daily and amlodipine 2.5mg   Patient checks blood pressure at home sometimes but did not check today - reports blood pressure usually in 120 to 130's / 70's  BP Readings from Last 3 Encounters:  06/26/23 136/80  06/18/23 138/74  04/24/23 130/70    Medication Access/Adherence  Current Pharmacy:  [facility] - [place], [place] - [address] [place] [address] Phone: [phone] Fax: [phone]  [id number] - [place], [place] - [address] [place] [address] Phone: [phone] Fax: [phone]   Patient reports affordability concerns with their medications: Yes  Patient reports access/transportation concerns to their pharmacy: No  Patient reports adherence concerns with their medications:  No        Objective:  Lab Results  Component Value Date   HGBA1C 8.7 (H) 03/28/2023    Lab Results  Component Value Date   CREATININE 1.4 (A) 06/11/2023   BUN 20 06/11/2023   NA 138 06/11/2023   K 4.6 06/11/2023   CL 102 06/11/2023   CO2 29 (A) 06/11/2023    Lab Results  Component Value Date   CHOL 103 06/06/2022   HDL 48.30 06/06/2022   LDLCALC 45 06/06/2022   TRIG 47.0 06/06/2022   CHOLHDL 2 06/06/2022    Medications Reviewed Today     Reviewed by Henrene Pastor, RPH-CPP (Pharmacist) on 07/11/23 at 1419  Med List Status: <None>   Medication Order Taking? Sig Documenting Provider Last Dose Status Informant  Accu-Chek FastClix Lancets MISC [id number] Yes Use to check blood glucose once a day (Dx: type 2 DM - E 11.9) Paz, Nolon Rod, MD Taking Active   albuterol (VENTOLIN HFA) 108 (90 Base) MCG/ACT inhaler [id number] Yes Inhale 2 puffs into the lungs every 6 (six) hours as needed for wheezing or shortness of breath. Wanda Plump, MD Taking Active   amLODipine (NORVASC) 2.5 MG tablet [id number] Yes Take 1 tablet (2.5 mg total) by mouth daily. Wanda Plump, MD Taking Active  aspirin EC 81 MG tablet [id number] Yes Take 81 mg by mouth daily. Each morning. Swallow whole. [provider] Taking Active   atorvastatin (LIPITOR) 40 MG tablet [id number] Yes Take 1 tablet (40 mg total) by mouth at bedtime. Wanda Plump, MD Taking Active   BREO ELLIPTA 100-25 MCG/ACT AEPB [id number] Yes Inhale 1 puff into the lungs daily. Wanda Plump, MD Taking Active   Caraway Oil-Levomenthol (FDGARD) 25-20.75 MG CAPS [id number] Yes Take 2 tablets by mouth in the morning and at bedtime.  [provider]  Taking Active Self  dicyclomine (BENTYL) 20 MG tablet [id number] Yes Take 1 tablet (20 mg total) by mouth 3 (three) times daily before meals. Wanda Plump, MD Taking Active   empagliflozin (JARDIANCE) 10 MG TABS tablet [id number] Yes Take 1 tablet (10 mg total) by mouth daily before breakfast. Wanda Plump, MD Taking Active   escitalopram (LEXAPRO) 10 MG tablet [id number] Yes Take 1 tablet (10 mg total) by mouth daily. Wanda Plump, MD Taking Active   famotidine (PEPCID) 20 MG tablet [id number] Yes Take 1 tablet (20 mg total) by mouth 2 (two) times daily. Wanda Plump, MD Taking Active   Flaxseed, Linseed, (FLAXSEED OIL) 1000 MG CAPS [id number] Yes Take 2,000 mg by mouth daily.  [provider] Taking Active Self  fluticasone (FLONASE) 50 MCG/ACT nasal spray [id number] Yes Place 1 spray into both nostrils daily. Wanda Plump, MD Taking Active   furosemide (LASIX) 40 MG tablet [id number] Yes Take 1 tablet (40 mg total) by mouth daily. Wanda Plump, MD Taking Active   glimepiride (AMARYL) 2 MG tablet [id number] Yes Take 1 tablet (2 mg total) by mouth daily with breakfast. Wanda Plump, MD Taking Active   glucose blood (ACCU-CHEK GUIDE) test strip [id number] Yes Use to check blood glucose once a day (Dx: type 2 DM - E 11.9) Paz, Nolon Rod, MD Taking Active   ketoconazole (NIZORAL) 2 % cream [id number] Yes Apply 1 Application topically daily. Wanda Plump, MD Taking Active   losartan (COZAAR) 100 MG tablet [id number] Yes Take 1 tablet (100 mg total) by mouth daily. Wanda Plump, MD Taking Active   meclizine (ANTIVERT) 12.5 MG tablet [id number] Yes Take 1 tablet (12.5 mg total) by mouth 3 (three) times daily as needed for dizziness. Saguier, Ramon Dredge, PA-C Taking Active            Med Note (CANTER, Vicente Males D   Wed Oct 18, 2022  2:11 PM) PRN  omeprazole (PRILOSEC) 20 MG capsule [id number] Yes Take 1 capsule (20 mg total) by mouth daily. Wanda Plump, MD Taking Active   Semaglutide,0.25 or 0.5MG /DOS, (OZEMPIC, 0.25 OR 0.5  MG/DOSE,) 2 MG/3ML Namon Cirri [id number] Yes Inject 0.25 mg Subcutaneous once weekly [provider] Taking Active            Med Note Clydie Braun, Audria Takeshita B   Wed Jul 11, 2023  2:18 PM) [name] medication assistance program thru 04/16/2024              Assessment/Plan:   Diabetes: Currently uncontrolled  per last A1c but he is due to recheck anytime. HOme blood pressure has been improved since starting Ozempic. Weight is down 5 lbs. - Recommend to continue Jardiance, glimepiride and Ozempic 0.5mg  weekly - Patient denies personal or family history of multiple endocrine neoplasia type 2, medullary thyroid cancer; personal history of pancreatitis or gallbladder disease. - Continue to follow up with endocrinology, Dr Roanna Raider for  thyroid nodule and diabetes - Recommend to check glucose 1 to 2 times per day - Meets financial criteria for Ozmepic patient assistance program through [employer]. Application approved thry 04/16/2024.   Hyperlipidemia:  LDL and Tg are at goal - continue atorvastatin   Hypertension: office and home blood pressure have been at goal. - continue losartan and amlodipine  Medication Management: - Updated Rx for losartan  Meds ordered this encounter  Medications   losartan (COZAAR) 100 MG tablet    Sig: Take 1 tablet (100 mg total) by mouth daily.    Dispense:  100 tablet    Refill:  3     Follow Up Plan: 4 to 6 weeks.   Henrene Pastor, PharmD Clinical Pharmacist [facility]

## 2023-07-16 ENCOUNTER — Ambulatory Visit (INDEPENDENT_AMBULATORY_CARE_PROVIDER_SITE_OTHER): Payer: Medicare Other | Admitting: Podiatry

## 2023-07-16 ENCOUNTER — Encounter: Payer: Self-pay | Admitting: Podiatry

## 2023-07-16 DIAGNOSIS — B351 Tinea unguium: Secondary | ICD-10-CM

## 2023-07-16 DIAGNOSIS — I872 Venous insufficiency (chronic) (peripheral): Secondary | ICD-10-CM

## 2023-07-16 DIAGNOSIS — M79675 Pain in left toe(s): Secondary | ICD-10-CM | POA: Diagnosis not present

## 2023-07-16 DIAGNOSIS — M79674 Pain in right toe(s): Secondary | ICD-10-CM

## 2023-07-16 NOTE — Progress Notes (Signed)
This patient presents to the office with chief complaint of long thick painful nails.  Patient says the nails are painful walking and wearing shoes.  This patient is unable to self treat.  This patient is unable to trim his nails since he is unable to reach his nails.  He is diagnosed with venous stasis.  he presents to the office for preventative foot care services.  General Appearance  Alert, conversant and in no acute stress.  Vascular  Dorsalis pedis and posterior tibial  pulses are absent  bilaterally.  Capillary return is within normal limits  bilaterally. Temperature is within normal limits  bilaterally.  Neurologic  Senn-Weinstein monofilament wire test within normal limits  bilaterally. Muscle power within normal limits bilaterally.  Nails Thick disfigured discolored nails with subungual debris  from hallux to fifth toes bilaterally. No evidence of bacterial infection or drainage bilaterally.  Orthopedic  No limitations of motion  feet .  No crepitus or effusions noted.  No bony pathology or digital deformities noted.  Skin  normotropic skin with no porokeratosis noted bilaterally.  No signs of infections or ulcers noted.     Onychomycosis  Nails  B/L.  Pain in right toes  Pain in left toes  Debridement of nails both feet followed trimming the nails with dremel tool.    RTC 3 months.   Helane Gunther DPM

## 2023-07-23 ENCOUNTER — Other Ambulatory Visit: Payer: Self-pay | Admitting: Internal Medicine

## 2023-07-26 ENCOUNTER — Other Ambulatory Visit: Payer: Self-pay | Admitting: Internal Medicine

## 2023-08-06 ENCOUNTER — Other Ambulatory Visit: Payer: Self-pay | Admitting: Internal Medicine

## 2023-08-07 ENCOUNTER — Ambulatory Visit: Admitting: Adult Health

## 2023-08-07 ENCOUNTER — Encounter: Payer: Self-pay | Admitting: Adult Health

## 2023-08-07 ENCOUNTER — Telehealth: Admitting: Adult Health

## 2023-08-07 VITALS — BP 120/66 | HR 73 | Ht 65.0 in | Wt 238.6 lb

## 2023-08-07 DIAGNOSIS — G4733 Obstructive sleep apnea (adult) (pediatric): Secondary | ICD-10-CM

## 2023-08-07 NOTE — Progress Notes (Signed)
 @Patient  ID: Donald Jones, male    DOB: February 29, 1946, 78 y.o.   MRN: [id number]  Chief Complaint  Patient presents with   Follow-up    Referring provider: Ezell Hollow, MD  HPI: 78 year old male followed for obstructive sleep apnea (diagnosed in 2003) on nocturnal CPAP Medical history is significant for hypertension, diabetes and asthma  TEST/EVENTS :   08/07/2023 Follow up : OSA  Patient returns for follow up for sleep apnea. Has been on CPAP for >20 yrs. Feels he benefits from CPAP with decreased daytime sleepiness.  He says he does not sleep without his CPAP.  Last visit CPAP pressure changed to auto CPAP 12 to 16 cm H2O.  Due to residual events.  CPAP download shows no significant improvement with ongoing residual episodes.  Patient says his mask is leaking quite a bit.  His machine is also gotten old greater than 10 years feels it does not work as well as it used to.  Patient is also only gets about 6 hours of sleep because he is the caregiver for his wife who has Parkinson's.  CPAP download shows 100% compliance.  Daily average usage at 6 hours.  Patient is on auto CPAP 12 to 16 cm H2O.  AHI 10.5/hour.  Residual central episodes at 5.1/hour.  Daily average pressure at 13.8 cm H2O.  We discussed an in lab CPAP titration study.  Patient wants to hold off at this time due to his responsibilities at home.  Patient says he has some daytime sleepiness but just feels that he is tired in general.  He is not taking any pain or sedating medications.  Allergies  Allergen Reactions   Metformin  And Related     Intolerant, GI symptoms   Sulfa Antibiotics Nausea And Vomiting    Immunization History  Administered Date(s) Administered   Fluad Quad(high Dose 65+) 01/10/2019, 01/23/2020, 02/03/2022   Fluad Trivalent(High Dose 65+) 12/27/2022   Influenza Split 01/17/2011, 01/16/2012   Influenza Whole 02/18/2007   Influenza, High Dose Seasonal PF 01/22/2015, 02/03/2016, 01/29/2017, 01/09/2018    Influenza,inj,Quad PF,6+ Mos 02/05/2013, 01/27/2014, 03/28/2019   Influenza-Unspecified 01/15/2021   PFIZER Comirnaty (Gray Top)Covid-19 Tri-Sucrose Vaccine 09/24/2020   PFIZER(Purple Top)SARS-COV-2 Vaccination 04/12/2019, 06/03/2019, 04/07/2020   PNEUMOCOCCAL CONJUGATE-20 02/03/2022   Pfizer(Comirnaty )Fall Seasonal Vaccine 12 years and older 06/09/2022, 03/02/2023   Pneumococcal Conjugate-13 01/22/2015   Pneumococcal Polysaccharide-23 01/16/2003, 12/03/2012   Respiratory Syncytial Virus Vaccine ,Recomb Aduvanted(Arexvy ) 02/06/2022   Tdap 12/03/2012, 05/22/2016   Zoster Recombinant(Shingrix) 01/29/2018, 05/23/2018   Zoster, Live 02/09/2010    Past Medical History:  Diagnosis Date   Anxiety    Arthritis    hands right   Diabetes mellitus without complication (HCC)    GERD (gastroesophageal reflux disease)    Hypertension    Kidney stones    several episodes.  Last one in 2011.     Obesity    100-lb weight loss since 2011.    Polycythemia    Sleep apnea    on Cpap    Tobacco History: Social History   Tobacco Use  Smoking Status Never  Smokeless Tobacco Never   Counseling given: Not Answered   Outpatient Medications Prior to Visit  Medication Sig Dispense Refill   Accu-Chek FastClix Lancets MISC USE TO CHECK BLOOD GLUCOSE ONCE A DAY 102 each 12   albuterol  (VENTOLIN  HFA) 108 (90 Base) MCG/ACT inhaler Inhale 2 puffs into the lungs every 6 (six) hours as needed for wheezing or shortness of breath. 18 g 5  amLODipine  (NORVASC ) 2.5 MG tablet Take 1 tablet (2.5 mg total) by mouth daily. 90 tablet 0   aspirin  EC 81 MG tablet Take 81 mg by mouth daily. Each morning. Swallow whole.     atorvastatin  (LIPITOR) 40 MG tablet Take 1 tablet (40 mg total) by mouth at bedtime. 100 tablet 3   BREO ELLIPTA  100-25 MCG/ACT AEPB Inhale 1 puff into the lungs daily. 60 each 2   Caraway Oil-Levomenthol (FDGARD) 25-20.75 MG CAPS Take 2 tablets by mouth in the morning and at bedtime.       dicyclomine  (BENTYL ) 20 MG tablet Take 1 tablet (20 mg total) by mouth 3 (three) times daily before meals. 270 tablet 0   empagliflozin  (JARDIANCE ) 10 MG TABS tablet Take 1 tablet (10 mg total) by mouth daily before breakfast. 90 tablet 1   escitalopram  (LEXAPRO ) 10 MG tablet Take 1 tablet (10 mg total) by mouth daily. 90 tablet 1   famotidine  (PEPCID ) 20 MG tablet Take 1 tablet (20 mg total) by mouth 2 (two) times daily. 180 tablet 1   Flaxseed, Linseed, (FLAXSEED OIL) 1000 MG CAPS Take 2,000 mg by mouth daily.      fluticasone  (FLONASE ) 50 MCG/ACT nasal spray Place 1 spray into both nostrils daily. 16 g 12   furosemide  (LASIX ) 40 MG tablet Take 1 tablet (40 mg total) by mouth daily. 90 tablet 1   glimepiride  (AMARYL ) 2 MG tablet Take 1 tablet (2 mg total) by mouth daily with breakfast. 90 tablet 0   glucose blood (ACCU-CHEK GUIDE TEST) test strip USE TO CHECK BLOOD GLUCOSE ONCE A DAY 100 strip 12   ketoconazole  (NIZORAL ) 2 % cream Apply 1 Application topically daily. 30 g 0   losartan  (COZAAR ) 100 MG tablet Take 1 tablet (100 mg total) by mouth daily. 100 tablet 3   meclizine  (ANTIVERT ) 12.5 MG tablet Take 1 tablet (12.5 mg total) by mouth 3 (three) times daily as needed for dizziness. 30 tablet 0   omeprazole  (PRILOSEC ) 20 MG capsule Take 1 capsule (20 mg total) by mouth daily. 90 capsule 1   Semaglutide ,0.25 or 0.5MG /DOS, (OZEMPIC , 0.25 OR 0.5 MG/DOSE,) 2 MG/3ML SOPN Inject 0.25 mg Subcutaneous once weekly     No facility-administered medications prior to visit.     Review of Systems:   Constitutional:   No  weight loss, night sweats,  Fevers, chills,+ fatigue, or  lassitude.  HEENT:   No headaches,  Difficulty swallowing,  Tooth/dental problems, or  Sore throat,                No sneezing, itching, ear ache, nasal congestion, post nasal drip,   CV:  No chest pain,  Orthopnea, PND, swelling in lower extremities, anasarca, dizziness, palpitations, syncope.   GI  No heartburn,  indigestion, abdominal pain, nausea, vomiting, diarrhea, change in bowel habits, loss of appetite, bloody stools.   Resp: No shortness of breath with exertion or at rest.  No excess mucus, no productive cough,  No non-productive cough,  No coughing up of blood.  No change in color of mucus.  No wheezing.  No chest wall deformity  Skin: no rash or lesions.  GU: no dysuria, change in color of urine, no urgency or frequency.  No flank pain, no hematuria   MS:  No joint pain or swelling.  No decreased range of motion.  No back pain.    Physical Exam  BP 120/66 (BP Location: Left Arm, Patient Position: Sitting, Cuff Size: Large)  Pulse 73   Ht 5\' 5"  (1.651 m)   Wt 238 lb 9.6 oz (108.2 kg)   SpO2 100%   BMI 39.71 kg/m   GEN: A/Ox3; pleasant , NAD, well nourished    HEENT:  /AT,   NOSE-clear, THROAT-clear, no lesions, no postnasal drip or exudate noted.  Class III-IV MP airway  NECK:  Supple w/ fair ROM; no JVD; normal carotid impulses w/o bruits; no thyromegaly or nodules palpated; no lymphadenopathy.    RESP  Clear  P & A; w/o, wheezes/ rales/ or rhonchi. no accessory muscle use, no dullness to percussion  CARD:  RRR, no m/r/g, no peripheral edema, pulses intact, no cyanosis or clubbing.  GI:   Soft & nt; nml bowel sounds; no organomegaly or masses detected.   Musco: Warm bil, no deformities or joint swelling noted.   Neuro: alert, no focal deficits noted.    Skin: Warm, no lesions or rashes    Lab Results:  CBC     BNP No results found for: "BNP"    Imaging: No results found.  Administration History     None           No data to display          No results found for: "NITRICOXIDE"      Assessment & Plan:   OSA (obstructive sleep apnea) Longstanding obstructive sleep apnea on CPAP for greater than 20 years.  Patient is having some residual sleep apneic events.  Will adjust CPAP pressure at 13 cm H2O.  Check CPAP download in 4 weeks.  Order  for new CPAP machine.  Also needs new supplies.   Order sent to DME company [employer].  CPAP care discussed.  Plan  Patient Instructions  Change CPAP pressure to 13 cmH2o.  CPAP download in 4 weeks. I will call if changes are needed.  Continue on CPAP At bedtime wear all night long for at least 6hr or more.  Order for new supplies and new CPAP machine .  Keep up good work.  Work on healthy weight loss.  Saline nasal spray Twice daily   Saline nasal gel Twice daily  .  Follow up for 1 year with Dr. Alva  or Markevious Ehmke NP as needed         Roena Clark, NP 08/07/2023

## 2023-08-07 NOTE — Patient Instructions (Addendum)
 Change CPAP pressure to 13 cmH2o.  CPAP download in 4 weeks. I will call if changes are needed.  Continue on CPAP At bedtime wear all night long for at least 6hr or more.  Order for new supplies and new CPAP machine .  Keep up good work.  Work on healthy weight loss.  Saline nasal spray Twice daily   Saline nasal gel Twice daily  .  Follow up for 1 year with Dr. Alva  or Tieasha Larsen NP as needed

## 2023-08-07 NOTE — Assessment & Plan Note (Signed)
 Longstanding obstructive sleep apnea on CPAP for greater than 20 years.  Patient is having some residual sleep apneic events.  Will adjust CPAP pressure at 13 cm H2O.  Check CPAP download in 4 weeks.  Order for new CPAP machine.  Also needs new supplies.   Order sent to DME company [employer].  CPAP care discussed.  Plan  Patient Instructions  Change CPAP pressure to 13 cmH2o.  CPAP download in 4 weeks. I will call if changes are needed.  Continue on CPAP At bedtime wear all night long for at least 6hr or more.  Order for new supplies and new CPAP machine .  Keep up good work.  Work on healthy weight loss.  Saline nasal spray Twice daily   Saline nasal gel Twice daily  .  Follow up for 1 year with Dr. Alva  or Raju Coppolino NP as needed

## 2023-08-09 ENCOUNTER — Telehealth: Payer: Self-pay | Admitting: Adult Health

## 2023-08-09 DIAGNOSIS — G4733 Obstructive sleep apnea (adult) (pediatric): Secondary | ICD-10-CM

## 2023-08-09 NOTE — Telephone Encounter (Signed)
 Please let patient know that homecare company /DME and insurance are required at a sleep study in order to cover new CPAP machine.  Home sleep study ordered

## 2023-08-09 NOTE — Telephone Encounter (Signed)
 Called and spoke with patient, advised of message per Roena Clark NP.  He verbalized understanding.  Nothing further needed.

## 2023-08-09 NOTE — Telephone Encounter (Signed)
"  Donald Jones; Manga, Anniece Base; Tucker, Dolanda; Vikki Graves, [facility],  This order has been received. However, I am in need of a copy of the sleep study to process this request. 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 I looked through the cone account and did not see a copy. please advise."  Hi Tammy,  Could you please order a new sleep study for this patient? We don't have any records of the old sleep Study Study, so we'll need a fresh one to move forward the request. Thank you!

## 2023-08-22 DIAGNOSIS — E1165 Type 2 diabetes mellitus with hyperglycemia: Secondary | ICD-10-CM | POA: Diagnosis not present

## 2023-08-22 DIAGNOSIS — D751 Secondary polycythemia: Secondary | ICD-10-CM | POA: Diagnosis not present

## 2023-08-22 DIAGNOSIS — G4733 Obstructive sleep apnea (adult) (pediatric): Secondary | ICD-10-CM | POA: Diagnosis not present

## 2023-08-22 DIAGNOSIS — N183 Chronic kidney disease, stage 3 unspecified: Secondary | ICD-10-CM | POA: Diagnosis not present

## 2023-08-22 DIAGNOSIS — I10 Essential (primary) hypertension: Secondary | ICD-10-CM | POA: Diagnosis not present

## 2023-08-22 DIAGNOSIS — E041 Nontoxic single thyroid nodule: Secondary | ICD-10-CM | POA: Diagnosis not present

## 2023-08-22 DIAGNOSIS — H6690 Otitis media, unspecified, unspecified ear: Secondary | ICD-10-CM | POA: Diagnosis not present

## 2023-08-23 ENCOUNTER — Telehealth: Payer: Self-pay

## 2023-08-23 NOTE — Telephone Encounter (Signed)
 LMOM informing Pt that Ozempic  has arrived from Pt assistance.

## 2023-08-28 ENCOUNTER — Telehealth: Payer: Self-pay

## 2023-08-28 NOTE — Telephone Encounter (Signed)
 Copied from CRM [id number]. Topic: Clinical - Request for Lab/Test Order >> Aug 28, 2023 12:44 PM Eveleen Hinds B wrote: Reason for CRM: Patient call stating he was suppose to receive a call regarding setting up his home sleep study. Please call.  I called and spoke to pt. Pt states he did set this up but he has not received his kit in the mail. Pt states he spoke with them a week ago. I verified the address for pt. I informed pt to give this more time and if he has not received anything, to call our office or call [facility]. I gave pt [facility] number. Pt verbalized understanding. NFN

## 2023-08-29 ENCOUNTER — Ambulatory Visit: Admitting: Pharmacist

## 2023-08-29 ENCOUNTER — Telehealth: Payer: Self-pay | Admitting: Pharmacist

## 2023-08-29 ENCOUNTER — Telehealth

## 2023-08-29 VITALS — BP 120/68 | HR 67 | Wt 237.0 lb

## 2023-08-29 DIAGNOSIS — Z7985 Type 2 diabetes mellitus without complications: Secondary | ICD-10-CM

## 2023-08-29 DIAGNOSIS — I10 Essential (primary) hypertension: Secondary | ICD-10-CM

## 2023-08-29 MED ORDER — AMLODIPINE BESYLATE 2.5 MG PO TABS: 2.5000 mg | ORAL_TABLET | Freq: Every day | ORAL | 1 refills | Status: DC

## 2023-08-29 NOTE — Telephone Encounter (Signed)
 Left message on VM of Dr Illene Malm medical assistant regarding request to increase Ozmepic to 1mg  . Patient reports Dr Vick Gram recommended he increase dose last week but will need new Rx sent to [name]. Need to see if Dr Vick Gram will take over getting Ozempic .  Also requested last OV to be sent to our office by fax. Awaiting call back.

## 2023-08-29 NOTE — Progress Notes (Addendum)
 08/29/2023 Name: Donald Jones MRN: [id number] DOB: 09/24/1945  Chief Complaint  Patient presents with   Diabetes   Medication Management   Hyperlipidemia   Hypertension    Donald Jones is a 78 y.o. year old male who presented for an office visit.    They were referred to the pharmacist by their PCP for assistance in managing diabetes and medication access.    Subjective:  Diabetes:   Current medications: Jardiance  10mg  daily; Ozempic  0.5mg  once a week (started 05/14/2023) He has been approved to receive Ozempic  from [employer] medication assistance program received delivery 08/23/2023   Glimepiride  - on hold since starting Ozempic   Recent home blood glucose readings - 91 to 131   Current medication access support: Approved to receive Ozempic  in 2025 form [name].  Patient saw Dr Vick Gram 08/22/2023. She mentioned increasing Ozempic  to 1mg  weekly to help lower A1c but I cannot see A1c results from the 08/22/2023 visit.  He is also seeing Dr Vick Gram for thyroid  nodule. Biopsy completed 03/2023. FINAL MICROSCOPIC DIAGNOSIS: Benign follicular nodule ([name]  Next appointment with Dr Vick Gram August 2025.  Assessed for Jardiance  medication assistance program in past but patient's household income was above program cut off today patient states he might qualify.   Some foods causes him to feel more bloated - beans with skins, salads He is taking Align / probiotic to reduce gas and bloating.  He had constipation last week and the thought this could be due to Ozempic  but this week since he started Probiotic his bowels have been regular.  Wt Readings from Last 3 Encounters:  08/29/23 237 lb (107.5 kg)  08/07/23 238 lb 9.6 oz (108.2 kg)  06/26/23 235 lb 6 oz (106.8 kg)    Hyperlipidemia:  Taking atorvastatin  40mg  daily   Hypertension:  Current medications - losartan  100mg  daily and amlodipine  2.5mg   Patient checks blood pressure at home sometimes  Checked today  120/68 and pulse was 67.   BP Readings from Last 3 Encounters:  08/29/23 120/68  08/07/23 120/66  06/26/23 136/80    Medication Access/Adherence  Current Pharmacy:  [facility] - [place], [place] - [address] [place] [address] Phone: [phone] Fax: [phone]  [id number] - [place], [place] - [address] [place] [address] Phone: [phone] Fax: [phone]   Patient reports affordability concerns with their medications: Yes  Patient reports access/transportation concerns to their pharmacy: No  Patient reports adherence concerns with their medications:  No      Objective:  Lab Results  Component Value Date   HGBA1C 8.7 (H) 03/28/2023    Lab Results  Component Value Date   CREATININE 1.4 (A) 06/11/2023   BUN 20 06/11/2023   NA 138 06/11/2023   K 4.6 06/11/2023   CL 102 06/11/2023   CO2 29 (A) 06/11/2023    Lab Results  Component Value Date   CHOL 103 06/06/2022   HDL 48.30 06/06/2022   LDLCALC 45 06/06/2022   TRIG 47.0 06/06/2022   CHOLHDL 2 06/06/2022    Medications Reviewed Today     Reviewed by Cecilie Coffee, RPH-CPP (Pharmacist) on 08/29/23 at 1105  Med List Status: <None>   Medication Order Taking? Sig Documenting Provider Last Dose Status Informant  Accu-Chek FastClix Lancets MISC [id number]  USE TO CHECK BLOOD GLUCOSE ONCE A DAY Paz, Jose E, MD  Active   albuterol  (VENTOLIN  HFA) 108 [id number] Base) MCG/ACT inhaler  [id number] Yes Inhale 2 puffs into the lungs every 6 (six) hours as needed for wheezing or shortness of breath. Ezell Hollow, MD Taking Active   amLODipine  (NORVASC ) 2.5 MG tablet [id number]  Take 1 tablet (2.5 mg total) by mouth daily. Paz, Jose E, MD  Active   amoxicillin -clavulanate (AUGMENTIN ) 500-125 MG tablet [id number] Yes Take 500 mg by mouth every 12 (twelve) hours. [provider] Taking Active   aspirin  EC 81 MG tablet [id number] Yes Take 81 mg by  mouth daily. Each morning. Swallow whole. [provider] Taking Active   atorvastatin  (LIPITOR) 40 MG tablet [id number] Yes Take 1 tablet (40 mg total) by mouth at bedtime. Paz, Jose E, MD Taking Active   BREO ELLIPTA  100-25 MCG/ACT AEPB [id number] Yes Inhale 1 puff into the lungs daily. Paz, Jose E, MD Taking Active   dicyclomine  (BENTYL ) 20 MG tablet [id number] Yes Take 1 tablet (20 mg total) by mouth 3 (three) times daily before meals. Paz, Jose E, MD Taking Active   empagliflozin  (JARDIANCE ) 10 MG TABS tablet [id number] Yes Take 1 tablet (10 mg total) by mouth daily before breakfast. Paz, Jose E, MD Taking Active   escitalopram  (LEXAPRO ) 10 MG tablet [id number] Yes Take 1 tablet (10 mg total) by mouth daily. Ezell Hollow, MD Taking Active   famotidine  (PEPCID ) 20 MG tablet [id number] Yes Take 1 tablet (20 mg total) by mouth 2 (two) times daily. Ezell Hollow, MD Taking Active   Flaxseed, Linseed, (FLAXSEED OIL) 1000 MG CAPS [id number] Yes Take 2,000 mg by mouth daily.  [provider] Taking Active Self  fluticasone  (FLONASE ) 50 MCG/ACT nasal spray [id number]  Place 1 spray into both nostrils daily. Ezell Hollow, MD  Active   furosemide  (LASIX ) 40 MG tablet [id number] Yes Take 1 tablet (40 mg total) by mouth daily. Ezell Hollow, MD Taking Active   glimepiride  (AMARYL ) 2 MG tablet [id number] No Take 1 tablet (2 mg total) by mouth daily with breakfast.  Patient not taking: Reported on 08/29/2023   Ezell Hollow, MD Not Taking Active            Med Note [facility] [place] B   Wed Aug 29, 2023 11:04 AM) On hold currently  glucose blood (ACCU-CHEK GUIDE TEST) test strip [id number]  USE TO CHECK BLOOD GLUCOSE ONCE A DAY Ezell Hollow, MD  Active   losartan  (COZAAR ) 100 MG tablet [id number] Yes Take 1 tablet (100 mg total) by mouth daily. Ezell Hollow, MD Taking Active   meclizine  (ANTIVERT ) 12.5 MG tablet [id number] No Take 1 tablet (12.5 mg total) by mouth 3 (three) times daily as needed for dizziness.   Patient not taking: Reported on 08/29/2023   Sylvia Everts, PA-C Not Taking Active            Med Note (CANTER, KAYLYN D   Wed Oct 18, 2022  2:11 PM) PRN  omeprazole  (PRILOSEC ) 20 MG capsule [id number] Yes Take 1 capsule (20 mg total) by mouth daily. Paz, Jose E, MD Taking Active   Probiotic Product Humphreys) CHEW [id number] Yes Chew 2 each by mouth daily. [provider] Taking Active   psyllium (METAMUCIL) 58.6 % powder [id number] Yes Take 1 packet by mouth daily. [provider] Taking Active   Semaglutide ,0.25 or 0.5MG /DOS, (OZEMPIC , 0.25 OR 0.5 MG/DOSE,) 2 MG/3ML SOPN [id number] Yes Inject 0.5 mg into the skin once a week. [provider] Taking Active  Med Note Alida Ion, Shima Compere B   Wed Jul 11, 2023  2:18 PM) [name] medication assistance program thru 04/16/2024              Assessment/Plan:   Diabetes: Currently uncontrolled  per last A1c but he is due to recheck anytime - will request labs from Dr Illene Malm office. Home blood glucose has improved.  - Recommend to continue Jardiance  10mg   daily and Ozempic  0.5mg  weekly - reached out to Dr Twila Gale about dose increase for Ozempic  to 1mg  weekly and to see if she will take over ordering from medication assistance program. Awaiting call back.  - Requested Dr Illene Malm office to fax over last office notes and labs.  - Continue to follow up with endocrinology, Dr Vick Gram for thyroid  nodule and diabetes - Recommend to check glucose 1 to 2 times per day - Meets financial criteria for Ozmepic patient assistance program through [name]. Application approved thry 04/16/2024.   Hyperlipidemia:  LDL and Tg are at goal - continue atorvastatin    Hypertension: office and home blood pressure have been at goal. - continue losartan  and amlodipine   Medication Management: - Updated Rx for amlodipine   Meds ordered this encounter  Medications   amLODipine  (NORVASC ) 2.5 MG tablet    Sig: Take 1 tablet  (2.5 mg total) by mouth daily.    Dispense:  100 tablet    Refill:  1    Follow Up Plan: 4 to 6 weeks.   Cecilie Coffee, PharmD Clinical Pharmacist [facility]   08/29/2023 at 4:10pm Received call back from Dr Illene Malm office. They will take over ordering Ozempic  from [employer] medication assistance program. Dr Illene Malm office has a change request form for [employer] and will complete and send over.  CMA also reported that an A1c was not checked at last office visit.   Cecilie Coffee, PharmD Clinical Pharmacist [facility]

## 2023-09-03 NOTE — Telephone Encounter (Signed)
 Received call back from Dr Illene Malm CMA. She will start process for increasing dose of Ozempic  and will have provider changed to Dr Vick Gram.  Per CMA, an A1c was not checked at last appointment with Dr Vick Gram. She will send fax of last OV.

## 2023-09-12 ENCOUNTER — Encounter: Payer: Self-pay | Admitting: Pharmacist

## 2023-09-12 ENCOUNTER — Encounter

## 2023-09-12 ENCOUNTER — Other Ambulatory Visit: Admitting: Pharmacist

## 2023-09-12 DIAGNOSIS — G4730 Sleep apnea, unspecified: Secondary | ICD-10-CM | POA: Diagnosis not present

## 2023-09-12 DIAGNOSIS — G4733 Obstructive sleep apnea (adult) (pediatric): Secondary | ICD-10-CM

## 2023-09-12 DIAGNOSIS — E119 Type 2 diabetes mellitus without complications: Secondary | ICD-10-CM

## 2023-09-12 NOTE — Progress Notes (Signed)
 09/12/2023 Name: Donald Jones MRN: [id number] DOB: 1946/03/07  Chief Complaint  Patient presents with   Medication Management   Diabetes    Donald Jones is a 78 y.o. year old male who presented for an office visit.    They were referred to the pharmacist by their PCP for assistance in managing diabetes and medication access.    Subjective:  Diabetes:   Current medications: Jardiance  10mg  daily; Ozempic  0.5mg  once a week (started 05/14/2023) He has been approved to receive Ozempic  from [employer] medication assistance program received delivery 08/23/2023   Glimepiride  - on hold since starting Ozempic   Recent home blood glucose readings - 110 this morning but mostly in the 90's. He states highest blood glucose has been 145.    Current medication access support: Approved to receive Ozempic  in 2025 form [name].  Patient saw Dr Vick Gram 08/22/2023. She mentioned increasing Ozempic  to 1mg  weekly to help lower A1c but I cannot see A1c results from the 08/22/2023 visit. Patient is hesitant to increase Ozempic  due to having gas pains from time to time.  He is also seeing Dr Vick Gram for thyroid  nodule. Biopsy completed 03/2023. FINAL MICROSCOPIC DIAGNOSIS: Benign follicular nodule ([name]  Next appointment with Dr Vick Gram August 2025.  Assessed for Jardiance  medication assistance program in past but patient's household income was above program cut off today patient states he might qualify.   Some foods causes him to feel more bloated - beans with skins, salads He is taking Align / probiotic to reduce gas and bloating.    Wt Readings from Last 3 Encounters:  08/29/23 237 lb (107.5 kg)  08/07/23 238 lb 9.6 oz (108.2 kg)  06/26/23 235 lb 6 oz (106.8 kg)    Hyperlipidemia:  Taking atorvastatin  40mg  daily   Hypertension:  Current medications - losartan  100mg  daily and amlodipine  2.5mg   Patient checks blood pressure at home sometimes  Has not checked today yet.   BP  Readings from Last 3 Encounters:  08/29/23 120/68  08/07/23 120/66  06/26/23 136/80    Medication Access/Adherence  Current Pharmacy:  [facility] - [place], [place] - [address] [address] Phone: [phone] Fax: [phone]  [id number] - [place], [place] - [address] [place] [address] Phone: [phone] Fax: [phone]   Patient reports affordability concerns with their medications: Yes  Patient reports access/transportation concerns to their pharmacy: No  Patient reports adherence concerns with their medications:  No      Objective:  Lab Results  Component Value Date   HGBA1C 8.7 (H) 03/28/2023    Lab Results  Component Value Date   CREATININE 1.4 (A) 06/11/2023   BUN 20 06/11/2023   NA 138 06/11/2023   K 4.6 06/11/2023   CL 102 06/11/2023   CO2 29 (A) 06/11/2023    Lab Results  Component Value Date   CHOL 103 06/06/2022   HDL 48.30 06/06/2022   LDLCALC 45 06/06/2022   TRIG 47.0 06/06/2022   CHOLHDL 2 06/06/2022    Medications Reviewed Today     Reviewed by Cecilie Coffee, RPH-CPP (Pharmacist) on 09/12/23 at 1154  Med List Status: <None>   Medication Order Taking? Sig Documenting Provider Last Dose Status Informant  Accu-Chek FastClix Lancets MISC [id number] Yes USE TO CHECK BLOOD GLUCOSE ONCE A DAY Ezell Hollow, MD Taking Active   albuterol  (VENTOLIN  HFA) 108 [phone] Base) MCG/ACT inhaler [id number] No Inhale 2  puffs into the lungs every 6 (six) hours as needed for wheezing or shortness of breath.  Patient not taking: Reported on 09/12/2023   Ezell Hollow, MD Not Taking Active   amLODipine  (NORVASC ) 2.5 MG tablet [id number] Yes Take 1 tablet (2.5 mg total) by mouth daily. Ezell Hollow, MD Taking Active   aspirin  EC 81 MG tablet [id number]  Take 81 mg by mouth daily. Each morning. Swallow whole. [provider]  Active   atorvastatin  (LIPITOR) 40 MG tablet [id number]  Yes Take 1 tablet (40 mg total) by mouth at bedtime. Ezell Hollow, MD Taking Active   BREO ELLIPTA  100-25 MCG/ACT AEPB [id number] No Inhale 1 puff into the lungs daily.  Patient not taking: Reported on 09/12/2023   Ezell Hollow, MD Not Taking Active   dicyclomine  (BENTYL ) 20 MG tablet [id number] Yes Take 1 tablet (20 mg total) by mouth 3 (three) times daily before meals. Ezell Hollow, MD Taking Active   empagliflozin  (JARDIANCE ) 10 MG TABS tablet [id number] Yes Take 1 tablet (10 mg total) by mouth daily before breakfast. Paz, Jose E, MD Taking Active   escitalopram  (LEXAPRO ) 10 MG tablet [id number] Yes Take 1 tablet (10 mg total) by mouth daily. Ezell Hollow, MD Taking Active   famotidine  (PEPCID ) 20 MG tablet [id number] Yes Take 1 tablet (20 mg total) by mouth 2 (two) times daily. Ezell Hollow, MD Taking Active   Flaxseed, Linseed, (FLAXSEED OIL) 1000 MG CAPS [id number] Yes Take 2,000 mg by mouth daily.  [provider] Taking Active Self  fluticasone  (FLONASE ) 50 MCG/ACT nasal spray [id number]  Place 1 spray into both nostrils daily. Paz, Jose E, MD  Active   furosemide  (LASIX ) 40 MG tablet [id number] Yes Take 1 tablet (40 mg total) by mouth daily. Ezell Hollow, MD Taking Active   glimepiride  (AMARYL ) 2 MG tablet [id number] No Take 1 tablet (2 mg total) by mouth daily with breakfast.  Patient not taking: Reported on 09/12/2023   Ezell Hollow, MD Not Taking Active            Med Note Alida Ion, [place] B   Wed Aug 29, 2023 11:04 AM) On hold currently  glucose blood (ACCU-CHEK GUIDE TEST) test strip [id number]  USE TO CHECK BLOOD GLUCOSE ONCE A DAY Ezell Hollow, MD  Active   losartan  (COZAAR ) 100 MG tablet [id number] Yes Take 1 tablet (100 mg total) by mouth daily. Paz, Jose E, MD Taking Active   meclizine  (ANTIVERT ) 12.5 MG tablet [id number]  Take 1 tablet (12.5 mg total) by mouth 3 (three) times daily as needed for dizziness.  Patient not taking: Reported on 08/29/2023   Saguier, Edward, PA-C  Active             Med Note (CANTER, KAYLYN D   Wed Oct 18, 2022  2:11 PM) PRN  omeprazole  (PRILOSEC ) 20 MG capsule [id number] Yes Take 1 capsule (20 mg total) by mouth daily. Paz, Jose E, MD Taking Active   Probiotic Product [place]) CHEW [id number] Yes Chew 2 each by mouth daily. [provider] Taking Active   psyllium (METAMUCIL) 58.6 % powder [id number]  Take 1 packet by mouth daily. [provider]  Active   Semaglutide ,0.25 or 0.5MG /DOS, (OZEMPIC , 0.25 OR 0.5 MG/DOSE,) 2 MG/3ML SOPN [id number] Yes Inject 0.5 mg into the skin once a week. [provider] Taking Active            Med Note (Alida Ion, Keyra Virella B  Wed Jul 11, 2023  2:18 PM) [name] medication assistance program thru 04/16/2024              Assessment/Plan:   Diabetes: Currently uncontrolled  per last A1c but he states A1c was rechecked in May 2025 at endo office but unable to see results.   - Recommend to continue Jardiance  10mg   daily and Ozempic  0.5mg  weekly. He should reach out to Dr Vick Gram regarding gas with Ozempic .   - Ways to decreased gas / bloating provided thru mychart message  Limit carbonated beverages. Increase water intake  Eat smaller but if needed more frequent meals  Avoid / limit beans and dairy products which tend to increase gas and bloating.   Increase exercise / physical activity.   Try simethicone  for gas relief as needed.  - Continue to follow up with endocrinology, Dr Vick Gram for thyroid  nodule and diabetes - Recommend to check glucose 1 to 2 times per day - Meets financial criteria for Ozmepic patient assistance program through [name]. Application approved thry 04/16/2024.   Hyperlipidemia:  LDL and Tg are at goal - continue atorvastatin    Hypertension: office and home blood pressure have been at goal. - continue losartan  and amlodipine   Reviewed med list and refill history  Follow Up Plan: 4 to 6 weeks.   Cecilie Coffee, PharmD Clinical Pharmacist [facility]  [facility]

## 2023-09-25 ENCOUNTER — Other Ambulatory Visit: Payer: Self-pay | Admitting: Internal Medicine

## 2023-09-25 ENCOUNTER — Ambulatory Visit (INDEPENDENT_AMBULATORY_CARE_PROVIDER_SITE_OTHER)

## 2023-09-25 VITALS — Ht 65.0 in | Wt 237.0 lb

## 2023-09-25 DIAGNOSIS — Z0000 Encounter for general adult medical examination without abnormal findings: Secondary | ICD-10-CM | POA: Diagnosis not present

## 2023-09-25 NOTE — Progress Notes (Signed)
 Subjective:   Donald Jones is a 78 y.o. who presents for a Medicare Wellness preventive visit.  As a reminder, Annual Wellness Visits don't include a physical exam, and some assessments may be limited, especially if this visit is performed virtually. We may recommend an in-person follow-up visit with your provider if needed.  Visit Complete: Virtual I connected with  Donald Jones on 09/25/23 by a audio enabled telemedicine application and verified that I am speaking with the correct person using two identifiers.  Patient Location: Home  Provider Location: Home Office  I discussed the limitations of evaluation and management by telemedicine. The patient expressed understanding and agreed to proceed.  Vital Signs: Because this visit was a virtual/telehealth visit, some criteria may be missing or patient reported. Any vitals not documented were not able to be obtained and vitals that have been documented are patient reported.    Persons Participating in Visit: Patient.  AWV Questionnaire: No: Patient Medicare AWV questionnaire was not completed prior to this visit.  Cardiac Risk Factors include: advanced age (>28men, >74 women);male gender;diabetes mellitus;hypertension     Objective:     Today's Vitals   09/25/23 1015  Weight: 237 lb (107.5 kg)  Height: 5\' 5"  (1.651 m)   Body mass index is 39.44 kg/m.     09/25/2023   10:23 AM 09/22/2022    2:16 PM 09/06/2021    1:04 PM 07/24/2019   11:00 PM 07/04/2019    5:57 AM 05/27/2019    5:33 PM 04/03/2018    9:44 AM  Advanced Directives  Does Patient Have a Medical Advance Directive? Yes Yes No No No No No  Type of Estate agent of [place];Living will Living will       Copy of Healthcare Power of Attorney in Chart? No - copy requested        Would patient like information on creating a medical advance directive?   No - Patient declined Yes (Inpatient - patient requests chaplain consult to create a medical  advance directive) No - Patient declined No - Patient declined No - Patient declined    Current Medications (verified) Outpatient Encounter Medications as of 09/25/2023  Medication Sig   Accu-Chek FastClix Lancets MISC USE TO CHECK BLOOD GLUCOSE ONCE A DAY   albuterol  (VENTOLIN  HFA) 108 (90 Base) MCG/ACT inhaler Inhale 2 puffs into the lungs every 6 (six) hours as needed for wheezing or shortness of breath. (Patient not taking: Reported on 09/12/2023)   amLODipine  (NORVASC ) 2.5 MG tablet Take 1 tablet (2.5 mg total) by mouth daily.   aspirin  EC 81 MG tablet Take 81 mg by mouth daily. Each morning. Swallow whole.   atorvastatin  (LIPITOR) 40 MG tablet Take 1 tablet (40 mg total) by mouth at bedtime.   BREO ELLIPTA  100-25 MCG/ACT AEPB Inhale 1 puff into the lungs daily. (Patient not taking: Reported on 09/12/2023)   dicyclomine  (BENTYL ) 20 MG tablet Take 1 tablet (20 mg total) by mouth 3 (three) times daily before meals.   empagliflozin  (JARDIANCE ) 10 MG TABS tablet Take 1 tablet (10 mg total) by mouth daily before breakfast.   escitalopram  (LEXAPRO ) 10 MG tablet Take 1 tablet (10 mg total) by mouth daily.   famotidine  (PEPCID ) 20 MG tablet Take 1 tablet (20 mg total) by mouth 2 (two) times daily.   Flaxseed, Linseed, (FLAXSEED OIL) 1000 MG CAPS Take 2,000 mg by mouth daily.    fluticasone  (FLONASE ) 50 MCG/ACT nasal spray Place 1 spray into  both nostrils daily.   furosemide  (LASIX ) 40 MG tablet Take 1 tablet (40 mg total) by mouth daily.   glimepiride  (AMARYL ) 2 MG tablet Take 1 tablet (2 mg total) by mouth daily with breakfast. (Patient not taking: Reported on 09/12/2023)   glucose blood (ACCU-CHEK GUIDE TEST) test strip USE TO CHECK BLOOD GLUCOSE ONCE A DAY   losartan  (COZAAR ) 100 MG tablet Take 1 tablet (100 mg total) by mouth daily.   meclizine  (ANTIVERT ) 12.5 MG tablet Take 1 tablet (12.5 mg total) by mouth 3 (three) times daily as needed for dizziness. (Patient not taking: Reported on 08/29/2023)    omeprazole  (PRILOSEC ) 20 MG capsule Take 1 capsule (20 mg total) by mouth daily.   Probiotic Product (ALIGN) CHEW Chew 2 each by mouth daily.   psyllium (METAMUCIL) 58.6 % powder Take 1 packet by mouth daily.   Semaglutide ,0.25 or 0.5MG /DOS, (OZEMPIC , 0.25 OR 0.5 MG/DOSE,) 2 MG/3ML SOPN Inject 0.5 mg into the skin once a week.   No facility-administered encounter medications on file as of 09/25/2023.    Allergies (verified) Metformin  and related and Sulfa antibiotics   History: Past Medical History:  Diagnosis Date   Anxiety    Arthritis    hands right   Diabetes mellitus without complication (HCC)    GERD (gastroesophageal reflux disease)    Hypertension    Kidney stones    several episodes.  Last one in 2011.     Obesity    100-lb weight loss since 2011.    Polycythemia    Sleep apnea    on Cpap   Past Surgical History:  Procedure Laterality Date   CHOLECYSTECTOMY     HAND SURGERY  2010   R hand , d/t a injury, 3 surgeries    Family History  Problem Relation Age of Onset   Diabetes Mother    Coronary artery disease Mother 26   Stroke Father        M and F   Hypertension Father    Heart disease Sister        ?   Alcohol abuse Sister    Heart failure Sister    Colon cancer Sister 83       age ~ 52   Heart failure Brother    Prostate cancer Neg Hx    Stomach cancer Neg Hx    Rectal cancer Neg Hx    Esophageal cancer Neg Hx    Social History   Socioeconomic History   Marital status: Married    Spouse name: Not on file   Number of children: Not on file   Years of education: Not on file   Highest education level: Not on file  Occupational History   Occupation: RETIREd ---[employer], 2021  Tobacco Use   Smoking status: Never   Smokeless tobacco: Never  Vaping Use   Vaping status: Never Used  Substance and Sexual Activity   Alcohol use: No    Comment: none for 15 years   Drug use: No   Sexual activity: Not Currently  Other Topics Concern   Not on  file  Social History Narrative   1ST WIFE DIED FROM LUNG CANCER, 2ND WIFE DIED FROM RENAL CANCER.     Married x 4 , lives w/ wife   3 sisters and 3 brother : lost 2 sisters    Social Drivers of Corporate investment banker Strain: Low Risk  (09/25/2023)   Overall Financial Resource Strain (CARDIA)    Difficulty  of Paying Living Expenses: Not hard at all  Food Insecurity: No Food Insecurity (09/25/2023)   Hunger Vital Sign    Worried About Running Out of Food in the Last Year: Never true    Ran Out of Food in the Last Year: Never true  Transportation Needs: No Transportation Needs (09/25/2023)   PRAPARE - Administrator, Civil Service (Medical): No    Lack of Transportation (Non-Medical): No  Physical Activity: Inactive (09/25/2023)   Exercise Vital Sign    Days of Exercise per Week: 0 days    Minutes of Exercise per Session: 0 min  Stress: No Stress Concern Present (09/25/2023)   [employer] of Occupational Health - Occupational Stress Questionnaire    Feeling of Stress : Not at all  Social Connections: Socially Integrated (09/25/2023)   Social Connection and Isolation Panel [NHANES]    Frequency of Communication with Friends and Family: More than three times a week    Frequency of Social Gatherings with Friends and Family: More than three times a week    Attends Religious Services: More than 4 times per year    Active Member of [employer] or Organizations: Yes    Attends Engineer, structural: More than 4 times per year    Marital Status: Married    Tobacco Counseling Counseling given: Not Answered    Clinical Intake:  Pre-visit preparation completed: Yes  Pain : No/denies pain     BMI - recorded: 39.44 Nutritional Status: BMI > 30  Obese Nutritional Risks: None Diabetes: Yes CBG done?: Yes (CBG 114 per patient) CBG resulted in Enter/ Edit results?: Yes Did pt. bring in CBG monitor from home?: No  Lab Results  Component Value Date   HGBA1C 8.7  (H) 03/28/2023   HGBA1C 8.1 (H) 12/27/2022   HGBA1C 7.1 (H) 09/12/2022     How often do you need to have someone help you when you read instructions, pamphlets, or other written materials from your doctor or pharmacy?: 3 - Sometimes (Wife assist)  Interpreter Needed?: No  Information entered by :: Farris Hong LPN   Activities of Daily Living     09/25/2023   10:22 AM  In your present state of health, do you have any difficulty performing the following activities:  Hearing? 0  Vision? 0  Difficulty concentrating or making decisions? 0  Walking or climbing stairs? 1  Comment Uses a Cane  Dressing or bathing? 0  Doing errands, shopping? 0  Preparing Food and eating ? N  Using the Toilet? N  In the past six months, have you accidently leaked urine? N  Do you have problems with loss of bowel control? N  Managing your Medications? N  Managing your Finances? N  Housekeeping or managing your Housekeeping? N    Patient Care Team: Ezell Hollow, MD as PCP - General (Internal Medicine) Lucendia Rusk, MD as PCP - Cardiology (Cardiology) Almeda Jacobs, MD as Consulting Physician (Hematology and Oncology) Tish Forge, NP as Nurse Practitioner (Nurse Practitioner) Maris Sickle, MD as Referring Physician (Ophthalmology) Laurence Pons, MD as Referring Physician (Endocrinology)  I have updated your Care Teams any recent Medical Services you may have received from other providers in the past year.     Assessment:    This is a routine wellness examination for Donald Jones.  Hearing/Vision screen Hearing Screening - Comments:: Denies hearing difficulties   Vision Screening - Comments:: Wears rx glasses - up to date with routine eye exams  with  Dr Joanne Muckle   Goals Addressed               This Visit's Progress     Remain active (pt-stated)        Take care of my wife.       Depression Screen     09/25/2023   10:21 AM 06/26/2023    1:08 PM 03/28/2023   11:38 AM 10/18/2022     2:14 PM 09/22/2022    2:18 PM 09/12/2022   10:31 AM 06/06/2022    8:59 AM  PHQ 2/9 Scores  PHQ - 2 Score 0 0 0 0 0 0 0    Fall Risk     09/25/2023   10:22 AM 08/07/2023    9:02 AM 06/26/2023    1:08 PM 03/28/2023   11:38 AM 10/18/2022    2:14 PM  Fall Risk   Falls in the past year? 0 1 0 0 0  Number falls in past yr: 0 0 0 0 0  Injury with Fall? 0 0 0 0 0  Risk for fall due to : No Fall Risks Other (Comment)     Follow up Falls evaluation completed  Falls evaluation completed;Education provided Falls evaluation completed Falls evaluation completed    MEDICARE RISK AT HOME:  Medicare Risk at Home Any stairs in or around the home?: No If so, are there any without handrails?: No Home free of loose throw rugs in walkways, pet beds, electrical cords, etc?: Yes Adequate lighting in your home to reduce risk of falls?: Yes Life alert?: No Use of a cane, walker or w/c?: Yes Grab bars in the bathroom?: Yes Shower chair or bench in shower?: Yes Elevated toilet seat or a handicapped toilet?: Yes  TIMED UP AND GO:  Was the test performed?  No  Cognitive Function: 6CIT completed        09/25/2023   10:23 AM 09/22/2022    2:24 PM 09/06/2021    1:15 PM  6CIT Screen  What Year? 0 points 0 points 0 points  What month? 0 points 0 points 0 points  What time? 0 points 3 points 0 points  Count back from 20 0 points 4 points 0 points  Months in reverse 0 points 2 points 0 points  Repeat phrase 0 points 0 points 2 points  Total Score 0 points 9 points 2 points    Immunizations Immunization History  Administered Date(s) Administered   Fluad Quad(high Dose 65+) 01/10/2019, 01/23/2020, 02/03/2022   Fluad Trivalent(High Dose 65+) 12/27/2022   Influenza Split 01/17/2011, 01/16/2012   Influenza Whole 02/18/2007   Influenza, High Dose Seasonal PF 01/22/2015, 02/03/2016, 01/29/2017, 01/09/2018   Influenza,inj,Quad PF,6+ Mos 02/05/2013, 01/27/2014, 03/28/2019   Influenza-Unspecified  01/15/2021   PFIZER Comirnaty (Gray Top)Covid-19 Tri-Sucrose Vaccine 09/24/2020   PFIZER(Purple Top)SARS-COV-2 Vaccination 04/12/2019, 06/03/2019, 04/07/2020   PNEUMOCOCCAL CONJUGATE-20 02/03/2022   Pfizer(Comirnaty )Fall Seasonal Vaccine 12 years and older 06/09/2022, 03/02/2023   Pneumococcal Conjugate-13 01/22/2015   Pneumococcal Polysaccharide-23 01/16/2003, 12/03/2012   Respiratory Syncytial Virus Vaccine ,Recomb Aduvanted(Arexvy ) 02/06/2022   Tdap 12/03/2012, 05/22/2016   Zoster Recombinant(Shingrix) 01/29/2018, 05/23/2018   Zoster, Live 02/09/2010    Screening Tests Health Maintenance  Topic Date Due   COVID-19 Vaccine (7 - 2024-25 season) 08/30/2023   HEMOGLOBIN A1C  09/26/2023   FOOT EXAM  10/18/2023   INFLUENZA VACCINE  11/16/2023   OPHTHALMOLOGY EXAM  02/22/2024   Diabetic kidney evaluation - Urine ACR  03/27/2024   Diabetic kidney evaluation - eGFR  measurement  06/10/2024   Medicare Annual Wellness (AWV)  09/24/2024   DTaP/Tdap/Td (3 - Td or Tdap) 05/22/2026   Colonoscopy  09/19/2027   Pneumonia Vaccine 7+ Years old  Completed   Hepatitis C Screening  Completed   Zoster Vaccines- Shingrix  Completed   HPV VACCINES  Aged Out   Meningococcal B Vaccine  Aged Out    Health Maintenance  Health Maintenance Due  Topic Date Due   COVID-19 Vaccine (7 - 2024-25 season) 08/30/2023   Health Maintenance Items Addressed:   Additional Screening:  Vision Screening: Recommended annual ophthalmology exams for early detection of glaucoma and other disorders of the eye. Would you like a referral to an eye doctor? No    Dental Screening: Recommended annual dental exams for proper oral hygiene  Community Resource Referral / Chronic Care Management: CRR required this visit?  No   CCM required this visit?  No   Plan:    I have personally reviewed and noted the following in the patient's chart:   Medical and social history Use of alcohol, tobacco or illicit drugs   Current medications and supplements including opioid prescriptions. Patient is not currently taking opioid prescriptions. Functional ability and status Nutritional status Physical activity Advanced directives List of other physicians Hospitalizations, surgeries, and ER visits in previous 12 months Vitals Screenings to include cognitive, depression, and falls Referrals and appointments  In addition, I have reviewed and discussed with patient certain preventive protocols, quality metrics, and best practice recommendations. A written personalized care plan for preventive services as well as general preventive health recommendations were provided to patient.   Dewayne Ford, LPN   1/61/0960   After Visit Summary: (MyChart) Due to this being a telephonic visit, the after visit summary with patients personalized plan was offered to patient via MyChart   Notes: Nothing significant to report at this time.

## 2023-09-25 NOTE — Patient Instructions (Addendum)
 [name] , Thank you for taking time out of your busy schedule to complete your Annual Wellness Visit with me. I enjoyed our conversation and look forward to speaking with you again next year. I, as well as your care team,  appreciate your ongoing commitment to your health goals. Please review the following plan we discussed and let me know if I can assist you in the future. Your Game plan/ To Do List    Referrals: If you haven't heard from the office you've been referred to, please reach out to them at the phone provided.   Follow up Visits: Next Medicare AWV with our clinical staff: 09/30/24 @ 10:10a   Have you seen your provider in the last 6 months (3 months if uncontrolled diabetes)?  Next Office Visit with your provider: 12/28/23 @ 2p  Clinician Recommendations:  Aim for 30 minutes of exercise or brisk walking, 6-8 glasses of water, and 5 servings of fruits and vegetables each day.       This is a list of the screening recommended for you and due dates:  Health Maintenance  Topic Date Due   COVID-19 Vaccine (7 - 2024-25 season) 08/30/2023   Hemoglobin A1C  09/26/2023   Complete foot exam   10/18/2023   Flu Shot  11/16/2023   Eye exam for diabetics  02/22/2024   Yearly kidney health urinalysis for diabetes  03/27/2024   Yearly kidney function blood test for diabetes  06/10/2024   Medicare Annual Wellness Visit  09/24/2024   DTaP/Tdap/Td vaccine (3 - Td or Tdap) 05/22/2026   Colon Cancer Screening  09/19/2027   Pneumonia Vaccine  Completed   Hepatitis C Screening  Completed   Zoster (Shingles) Vaccine  Completed   HPV Vaccine  Aged Out   Meningitis B Vaccine  Aged Out    Advanced directives: (Copy Requested) Please bring a copy of your health care power of attorney and living will to the office to be added to your chart at your convenience. You can mail to [facility] [address] [place], [place] [address] or email to [email or web address] Advance Care  Planning is important because it:  [x]  Makes sure you receive the medical care that is consistent with your values, goals, and preferences  [x]  It provides guidance to your family and loved ones and reduces their decisional burden about whether or not they are making the right decisions based on your wishes.  Follow the link provided in your after visit summary or read over the paperwork we have mailed to you to help you started getting your Advance Directives in place. If you need assistance in completing these, please reach out to us  so that we can help you!  See attachments for Preventive Care and Fall Prevention Tips.

## 2023-10-02 ENCOUNTER — Other Ambulatory Visit: Payer: Self-pay | Admitting: Internal Medicine

## 2023-10-04 ENCOUNTER — Other Ambulatory Visit (HOSPITAL_BASED_OUTPATIENT_CLINIC_OR_DEPARTMENT_OTHER): Payer: Self-pay

## 2023-10-04 MED ORDER — COVID-19 MRNA VAC-TRIS(PFIZER) 30 MCG/0.3ML IM SUSY
0.3000 mL | PREFILLED_SYRINGE | Freq: Once | INTRAMUSCULAR | 0 refills | Status: AC
  Filled 2023-10-04: qty 0.3, 1d supply, fill #0

## 2023-10-09 ENCOUNTER — Ambulatory Visit: Payer: Self-pay | Admitting: Adult Health

## 2023-10-09 ENCOUNTER — Other Ambulatory Visit: Payer: Self-pay | Admitting: Internal Medicine

## 2023-10-10 ENCOUNTER — Other Ambulatory Visit: Payer: Self-pay | Admitting: *Deleted

## 2023-10-10 DIAGNOSIS — G4733 Obstructive sleep apnea (adult) (pediatric): Secondary | ICD-10-CM

## 2023-10-10 NOTE — Progress Notes (Signed)
 Called and spoke with patient, advised of information per Tammy.  He said he would be more comfortable if we could set up the HST through our office where he could take one of our kits home instead of going through [facility].  Advised I would let Tammy know and we would arrange that for him and he would get a call to set up a time for him to come to the office.  He verbalized understanding.  Nothing further needed.  PCCs, please contact patient to get HST set up through our office with one of our kits.  Thank you.

## 2023-10-15 ENCOUNTER — Telehealth: Payer: Self-pay | Admitting: Pharmacist

## 2023-10-15 ENCOUNTER — Ambulatory Visit (INDEPENDENT_AMBULATORY_CARE_PROVIDER_SITE_OTHER): Admitting: Pharmacist

## 2023-10-15 ENCOUNTER — Ambulatory Visit: Admitting: Podiatry

## 2023-10-15 DIAGNOSIS — Z7985 Long-term (current) use of injectable non-insulin antidiabetic drugs: Secondary | ICD-10-CM

## 2023-10-15 DIAGNOSIS — E119 Type 2 diabetes mellitus without complications: Secondary | ICD-10-CM

## 2023-10-15 DIAGNOSIS — I10 Essential (primary) hypertension: Secondary | ICD-10-CM

## 2023-10-15 NOTE — Telephone Encounter (Signed)
 Patient is requesting Ozempic  1mg  dose from medication assistance program with [name]. Dose increase was recommended at last OV with Dr Braulio. Their office will need to send in a change in dose form to [name]. LM on VM of Amber, Dr Doerr's CMA.

## 2023-10-15 NOTE — Progress Notes (Signed)
 10/15/2023 Name: Donald Jones MRN: [id number] DOB: 05-29-1945  Chief Complaint  Patient presents with   Medication Management   Diabetes   Hyperlipidemia   Hypertension    Donald Jones is a 78 y.o. year old male who presented for an office visit.    They were referred to the pharmacist by their PCP for assistance in managing diabetes and medication access.    Subjective:  Diabetes:   Current medications: Jardiance  10mg  daily; Ozempic  0.5mg  once a week (started 05/14/2023) He has been approved to receive Ozempic  from [employer] medication assistance program received delivery 08/23/2023   Glimepiride  - on hold since starting Ozempic   Recent home blood glucose readings - mostly in the 90's. He states highest blood glucose has been 145. Was 197 today - 1 hour post prandial.    Current medication access support: Approved to receive Ozempic  in 2025 form [name].  Patient saw Dr Braulio 08/22/2023. She mentioned increasing Ozempic  to 1mg  weekly to help lower A1c but I cannot see A1c results from the 08/22/2023 visit.  At our last visit, Donald Jones was hesitant to increase Ozempic  due to having gas pains from time to time. We discussed some ways to decrease gas and bloating and today he reports that gas and bloating has improved - he is rarely having these symptoms.  He is also seeing Dr Braulio for thyroid  nodule. Biopsy completed 03/2023. FINAL MICROSCOPIC DIAGNOSIS: Benign follicular nodule ([name]  Next appointment with Dr Braulio August 2025.  Assessed for Jardiance  medication assistance program in past but patient's household income was above program cut off today patient states he might qualify.   Some foods causes him to feel more bloated - beans with skins, salads He continues to take Align / probiotic to reduce gas and bloating.   Weight before starting Ozempic  = 240 lbs BMI prior to starting Ozempic  = 40  Wt Readings from Last 3 Encounters:  09/25/23 237 lb  (107.5 kg)  08/29/23 237 lb (107.5 kg)  08/07/23 238 lb 9.6 oz (108.2 kg)    Hyperlipidemia:  Taking atorvastatin  40mg  daily   Hypertension:  Current medications - losartan  100mg  daily and amlodipine  2.5mg   Patient checks blood pressure at home sometimes but did not have any readings to report today  BP Readings from Last 3 Encounters:  08/29/23 120/68  08/07/23 120/66  06/26/23 136/80    Medication Access/Adherence  Current Pharmacy:  [facility] - [place], [place] - [address] [place] [address] Phone: [phone] Fax: [phone]  [id number] - [place],  - [address] [place] [address] Phone: [phone] Fax: [phone]   Patient reports affordability concerns with their medications: Yes  Patient reports access/transportation concerns to their pharmacy: No  Patient reports adherence concerns with their medications:  No      Objective:  Lab Results  Component Value Date   HGBA1C 8.7 (H) 03/28/2023    Lab Results  Component Value Date   CREATININE 1.4 (A) 06/11/2023   BUN 20 06/11/2023   NA 138 06/11/2023   K 4.6 06/11/2023   CL 102 06/11/2023   CO2 29 (A) 06/11/2023    Lab Results  Component Value Date   CHOL 103 06/06/2022   HDL 48.30 06/06/2022   LDLCALC 45 06/06/2022   TRIG 47.0 06/06/2022   CHOLHDL 2 06/06/2022    Medications Reviewed Today     Reviewed by Carla Milling, RPH-CPP (  Pharmacist) on 10/15/23 at 1202  Med List Status: <None>   Medication Order Taking? Sig Documenting Provider Last Dose Status Informant  Accu-Chek FastClix Lancets MISC [id number] Yes USE TO CHECK BLOOD GLUCOSE ONCE A DAY Paz, Jose E, MD  Active   albuterol  (VENTOLIN  HFA) 108 [phone] Base) MCG/ACT inhaler [id number] Yes Inhale 2 puffs into the lungs every 6 (six) hours as needed for wheezing or shortness of breath. Paz, Jose E, MD  Active   amLODipine  (NORVASC ) 2.5 MG tablet [id number]  Yes Take 1 tablet (2.5 mg total) by mouth daily. Amon Aloysius BRAVO, MD  Active   aspirin  EC 81 MG tablet [id number] Yes Take 81 mg by mouth daily. Each morning. Swallow whole. [provider]  Active   atorvastatin  (LIPITOR) 40 MG tablet [id number] Yes Take 1 tablet (40 mg total) by mouth at bedtime. Paz, Jose E, MD  Active   BREO ELLIPTA  100-25 MCG/ACT AEPB [id number]  Inhale 1 puff into the lungs daily.  Patient not taking: Reported on 10/15/2023   Paz, Jose E, MD  Active   dicyclomine  (BENTYL ) 20 MG tablet [id number]  Take 1 tablet (20 mg total) by mouth 3 (three) times daily before meals. Paz, Jose E, MD  Active   empagliflozin  (JARDIANCE ) 10 MG TABS tablet [id number] Yes Take 1 tablet (10 mg total) by mouth daily before breakfast. Paz, Jose E, MD  Active   escitalopram  (LEXAPRO ) 10 MG tablet [id number] Yes Take 1 tablet (10 mg total) by mouth daily. Amon Aloysius BRAVO, MD  Active   famotidine  (PEPCID ) 20 MG tablet [id number] Yes Take 1 tablet (20 mg total) by mouth 2 (two) times daily. Paz, Jose E, MD  Active   Flaxseed, Linseed, (FLAXSEED OIL) 1000 MG CAPS [id number] Yes Take 2,000 mg by mouth daily.  [provider]  Active Self  fluticasone  (FLONASE ) 50 MCG/ACT nasal spray [id number]  Place 1 spray into both nostrils daily. Paz, Jose E, MD  Active   furosemide  (LASIX ) 40 MG tablet [id number] Yes Take 1 tablet (40 mg total) by mouth daily. Paz, Jose E, MD  Active   glimepiride  (AMARYL ) 2 MG tablet [id number]  Take 1 tablet (2 mg total) by mouth daily with breakfast.  Patient not taking: Reported on 10/15/2023   Paz, Jose E, MD  Active   glucose blood (ACCU-CHEK GUIDE TEST) test strip [id number] Yes USE TO CHECK BLOOD GLUCOSE ONCE A DAY Amon Aloysius BRAVO, MD  Active   losartan  (COZAAR ) 100 MG tablet [id number] Yes Take 1 tablet (100 mg total) by mouth daily. Paz, Jose E, MD  Active   meclizine  (ANTIVERT ) 12.5 MG tablet [id number]  Take 1 tablet (12.5 mg total) by mouth 3 (three) times daily as needed for  dizziness.  Patient not taking: Reported on 08/29/2023   Saguier, Edward, PA-C  Active            Med Note (CANTER, KAYLYN D   Wed Oct 18, 2022  2:11 PM) PRN  omeprazole  (PRILOSEC ) 20 MG capsule [id number] Yes Take 1 capsule (20 mg total) by mouth daily. Paz, Jose E, MD  Active   Probiotic Product [place]) CHEW [id number] Yes Chew 2 each by mouth daily. [provider]  Active   psyllium (METAMUCIL) 58.6 % powder [id number] Yes Take 1 packet by mouth daily. [provider]  Active   Semaglutide ,0.25 or 0.5MG /DOS, (OZEMPIC , 0.25 OR 0.5 MG/DOSE,) 2 MG/3ML SOPN [id number] Yes Inject 0.5 mg into the skin once a week. [provider]  Active            Med Note JUSTINO, Kamesha Herne B   Wed Jul 11, 2023  2:18 PM) [name] medication assistance program thru 04/16/2024              Assessment/Plan:   Diabetes: Currently uncontrolled  per last A1c but he states A1c was rechecked in May 2025 at endo office but unable to see results.  Today's home blood glucose reading was high at 197. - Recommend to continue Jardiance  10mg   daily and Ozempic  0.5mg  weekly. He should reach out to Dr Braulio regarding increasing dose os Ozempic  - their office has taken over prescribing Ozempic  and should send in updated Rx to [employer] medication assistance program for this. I also reached out to their office and left a message on voicemail with Dr Starlyn CMA, Hospital doctor.  - Continue to follow up with endocrinology, Dr Braulio for thyroid  nodule and diabetes - Recommend to check glucose 1 to 2 times per day - Meets financial criteria for Ozmepic patient assistance program through [name]. Application approved thry 04/16/2024.   Hyperlipidemia:  LDL and Tg are at goal - continue atorvastatin  - assisted with requesting refill at his pharmacy  Hypertension: office and home blood pressure have been at goal. - continue losartan  and amlodipine  - assisted with requesting refills at his pharmacy  Reviewed med  list and refill history -   Follow Up Plan: 4 weeks.   Madelin Ray, PharmD Clinical Pharmacist [facility]

## 2023-10-15 NOTE — Telephone Encounter (Signed)
 Copied from CRM [id number]. Topic: General - Call Back - No Documentation >> Oct 15, 2023  2:24 PM Ernestene P wrote: Reason for CRM: Amber from [employer] called regarding a missed call- call back number  [phone]

## 2023-10-17 NOTE — Telephone Encounter (Signed)
 Florence Gauze, CMA with Dr Braulio. Unable to reach her. Left message on VM with my CB# [phone].

## 2023-10-17 NOTE — Telephone Encounter (Signed)
 Amber with [facility] called me back and states she has sent forms to [employer] x 2. Called [employer] and they state that a new application was sent but they just need forms for dose and HPC change. They canno accept the forms previously faxed.  Notified Hospital doctor at [employer] and she will submit correct forms.

## 2023-10-20 IMAGING — DX DG CHEST 2V
2 series · 2 of 2 positions shown · non-contrast
Comparison: 03/13/2020

CLINICAL DATA: Persistent cough

EXAM:
CHEST - 2 VIEW

[chest pa]
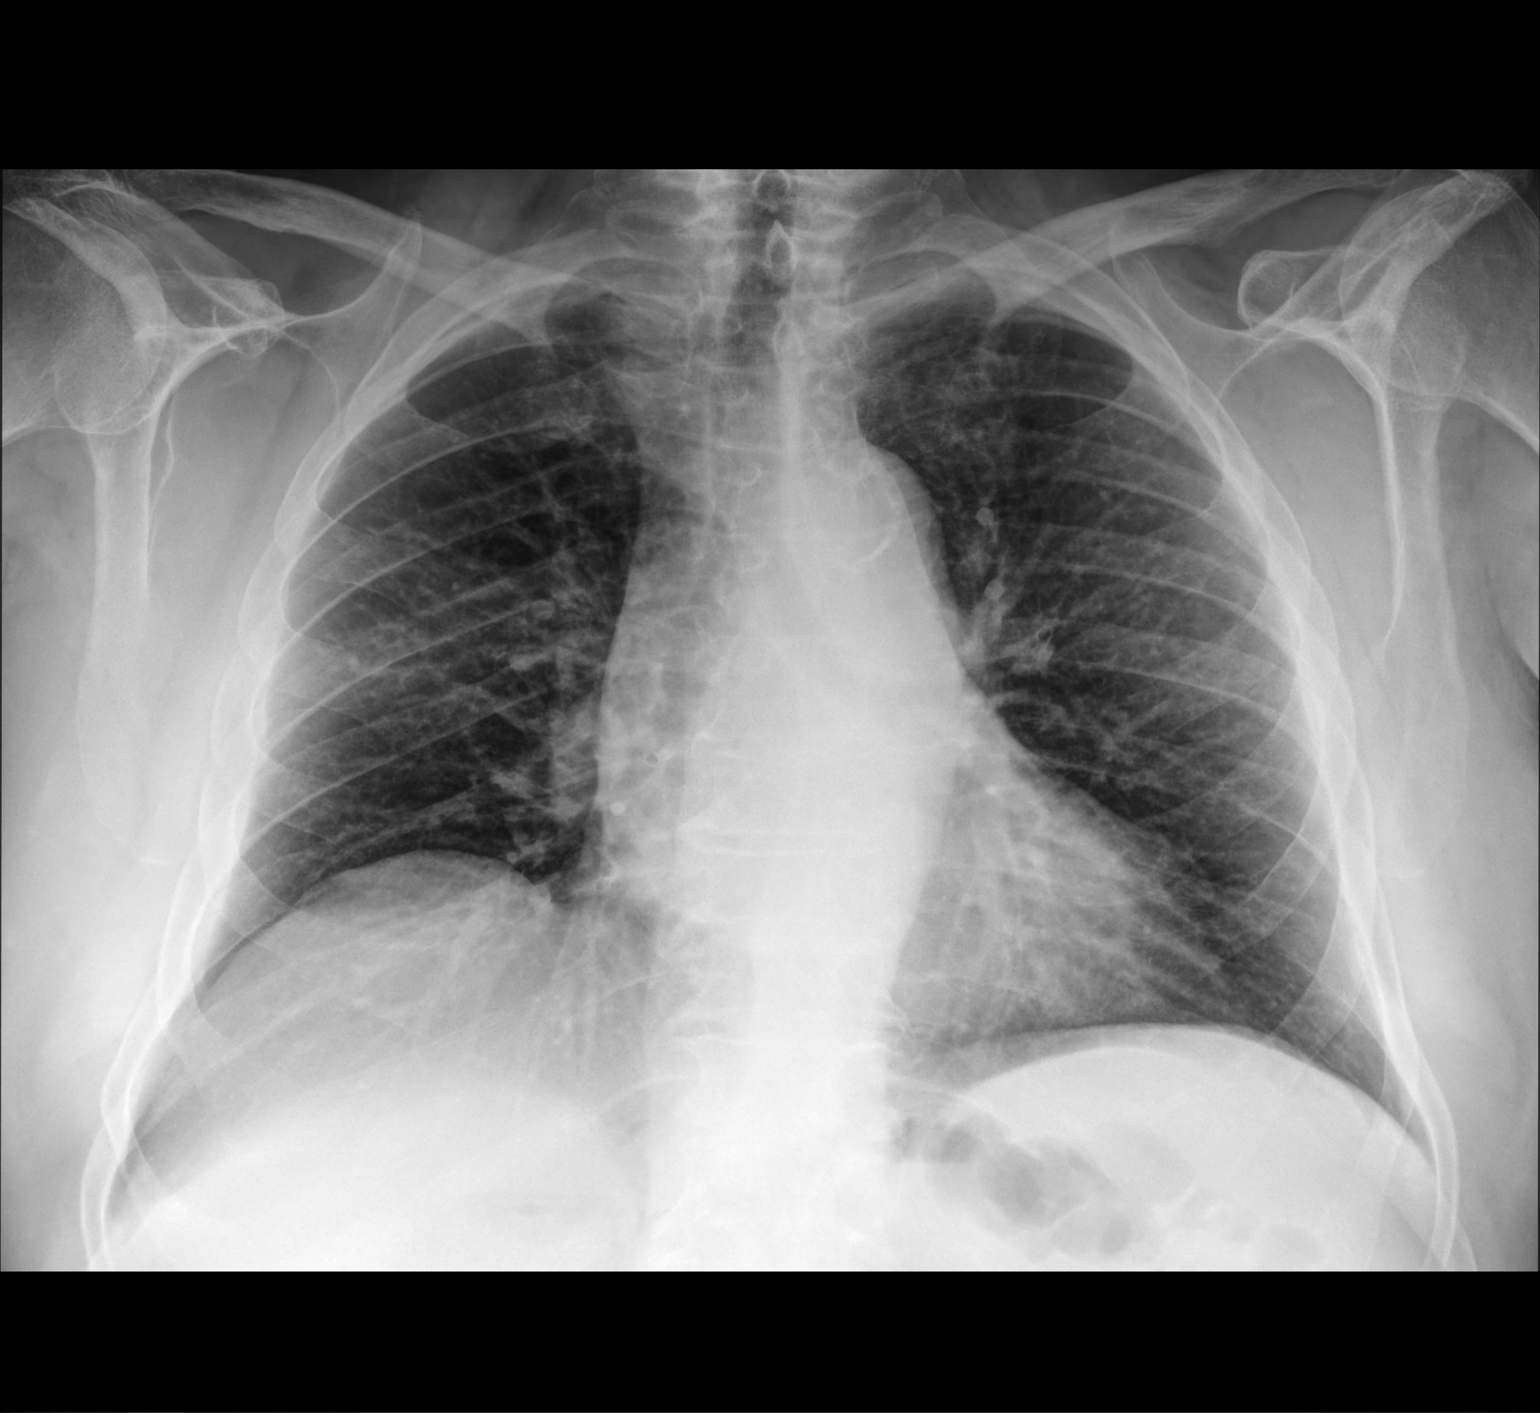

[chest lat]
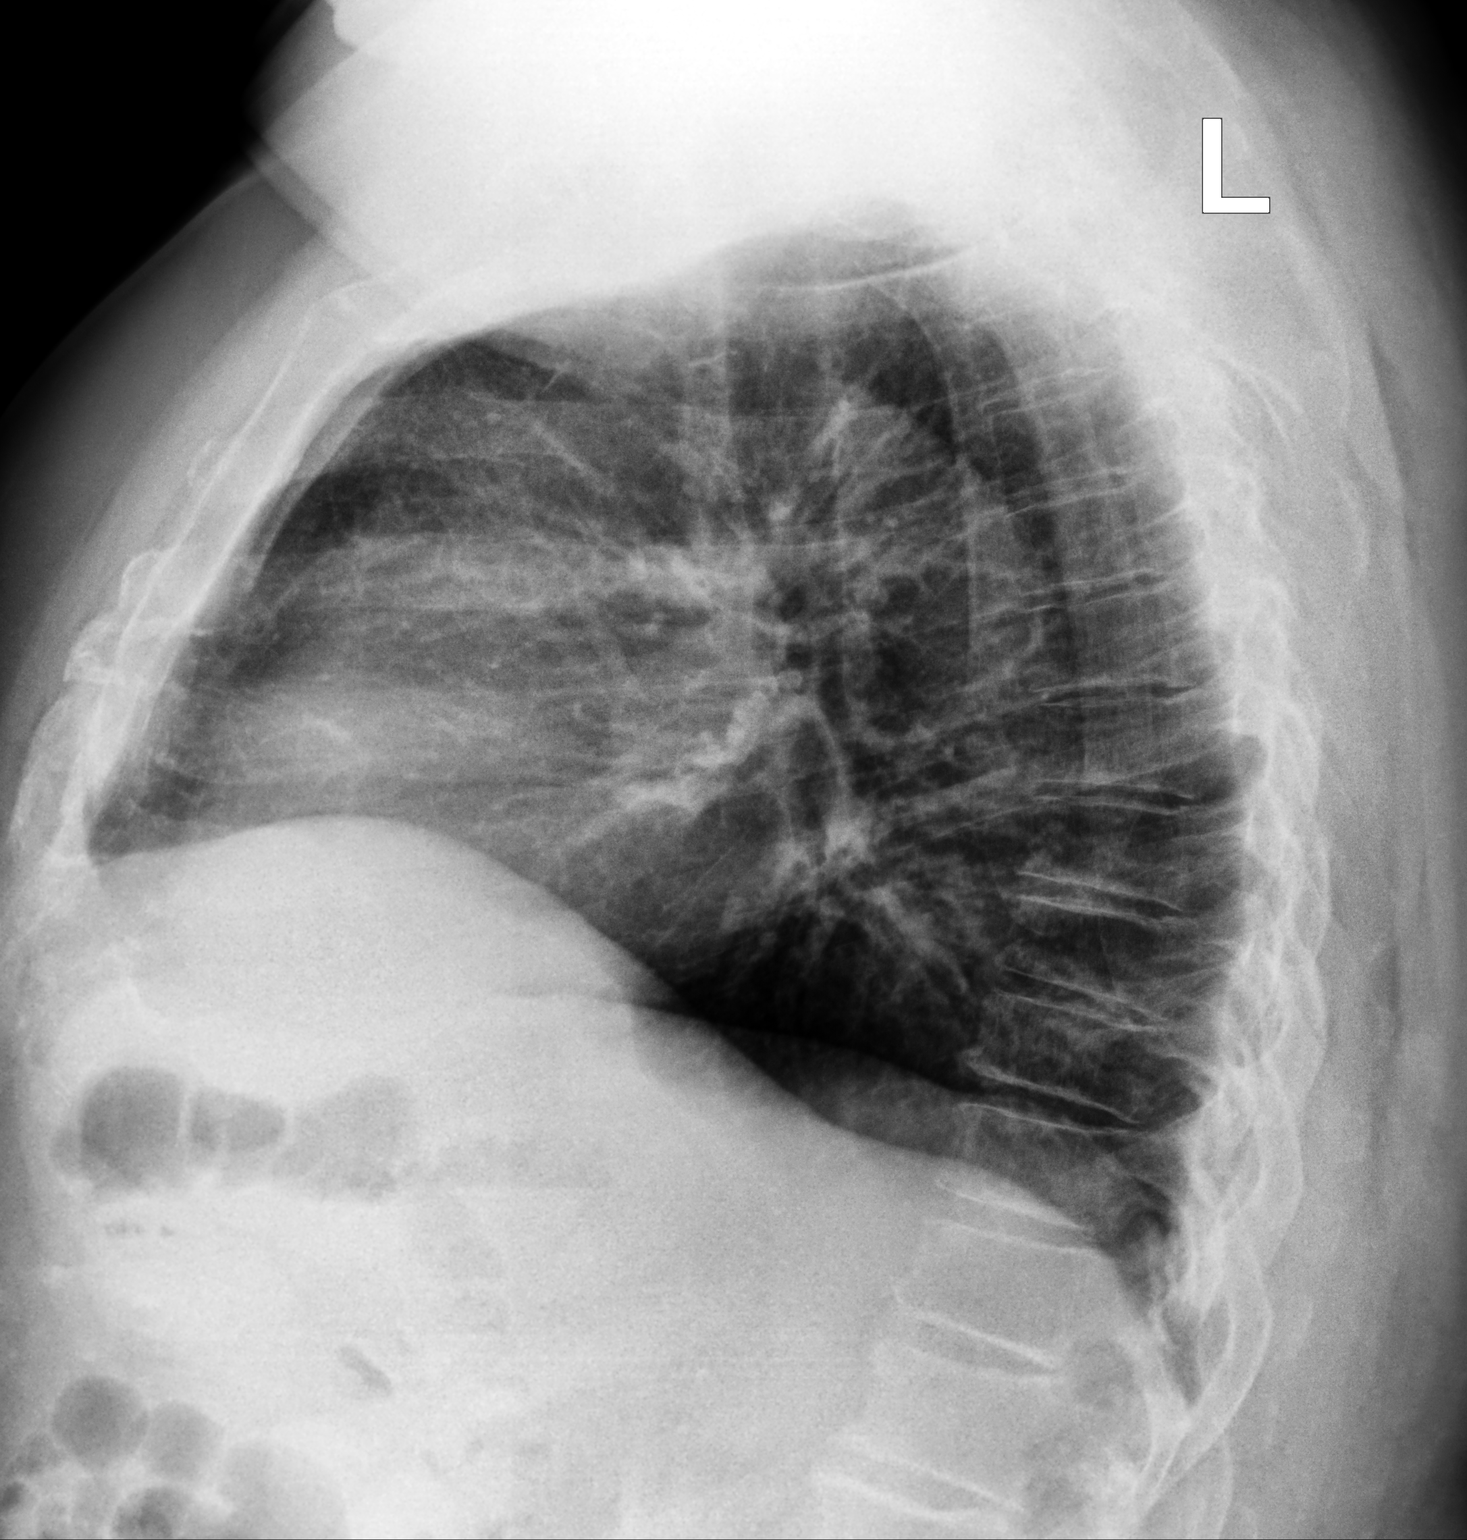

[2 of 2 positions shown; findings below may reference images not displayed]

FINDINGS: Cardiac size is within normal limits. Thoracic aorta is tortuous.
There are no signs of pulmonary edema or focal pulmonary
consolidation. There is no pleural effusion or pneumothorax.
IMPRESSION: No active cardiopulmonary disease.

## 2023-10-23 ENCOUNTER — Ambulatory Visit: Admitting: Podiatry

## 2023-10-23 ENCOUNTER — Encounter: Payer: Self-pay | Admitting: Podiatry

## 2023-10-23 DIAGNOSIS — M79674 Pain in right toe(s): Secondary | ICD-10-CM

## 2023-10-23 DIAGNOSIS — E11620 Type 2 diabetes mellitus with diabetic dermatitis: Secondary | ICD-10-CM

## 2023-10-23 DIAGNOSIS — B351 Tinea unguium: Secondary | ICD-10-CM | POA: Diagnosis not present

## 2023-10-23 DIAGNOSIS — I872 Venous insufficiency (chronic) (peripheral): Secondary | ICD-10-CM

## 2023-10-23 DIAGNOSIS — M79675 Pain in left toe(s): Secondary | ICD-10-CM | POA: Diagnosis not present

## 2023-10-23 NOTE — Progress Notes (Signed)
This patient presents to the office with chief complaint of long thick painful nails.  Patient says the nails are painful walking and wearing shoes.  This patient is unable to self treat.  This patient is unable to trim his nails since he is unable to reach his nails.  He is diagnosed with venous stasis.  he presents to the office for preventative foot care services.  General Appearance  Alert, conversant and in no acute stress.  Vascular  Dorsalis pedis and posterior tibial  pulses are absent  bilaterally.  Capillary return is within normal limits  bilaterally. Temperature is within normal limits  bilaterally.  Neurologic  Senn-Weinstein monofilament wire test within normal limits  bilaterally. Muscle power within normal limits bilaterally.  Nails Thick disfigured discolored nails with subungual debris  from hallux to fifth toes bilaterally. No evidence of bacterial infection or drainage bilaterally.  Orthopedic  No limitations of motion  feet .  No crepitus or effusions noted.  No bony pathology or digital deformities noted.  Skin  normotropic skin with no porokeratosis noted bilaterally.  No signs of infections or ulcers noted.     Onychomycosis  Nails  B/L.  Pain in right toes  Pain in left toes  Debridement of nails both feet followed trimming the nails with dremel tool.    RTC 3 months.   Helane Gunther DPM

## 2023-11-08 ENCOUNTER — Encounter

## 2023-11-09 ENCOUNTER — Ambulatory Visit

## 2023-11-09 DIAGNOSIS — G4733 Obstructive sleep apnea (adult) (pediatric): Secondary | ICD-10-CM

## 2023-11-12 ENCOUNTER — Other Ambulatory Visit: Payer: Self-pay

## 2023-11-12 ENCOUNTER — Ambulatory Visit (INDEPENDENT_AMBULATORY_CARE_PROVIDER_SITE_OTHER): Admitting: Pharmacist

## 2023-11-12 DIAGNOSIS — Z7985 Long-term (current) use of injectable non-insulin antidiabetic drugs: Secondary | ICD-10-CM

## 2023-11-12 DIAGNOSIS — E119 Type 2 diabetes mellitus without complications: Secondary | ICD-10-CM

## 2023-11-12 DIAGNOSIS — I10 Essential (primary) hypertension: Secondary | ICD-10-CM

## 2023-11-12 MED ORDER — DICYCLOMINE HCL 20 MG PO TABS: 20.0000 mg | ORAL_TABLET | Freq: Three times a day (TID) | ORAL | 0 refills | Status: DC

## 2023-11-12 NOTE — Progress Notes (Signed)
 11/12/2023 Name: Donald Jones MRN: [id number] DOB: 21-Nov-1945  Chief Complaint  Patient presents with   Medication Management   Diabetes    Donald Jones is a 78 y.o. year old male who presented for a phone visit   They were referred to the pharmacist by their PCP for assistance in managing diabetes and medication access.    Subjective:  Diabetes:   Current medications: Jardiance  10mg  daily; Ozempic  0.5mg  once a week (started 05/14/2023) - he is suppose to increase to 1mg  daily but is waiting on patient assistance program to send Ozempic  1mg .  He has been approved to receive Ozempic  from [employer] medication assistance program received delivery 08/23/2023   Glimepiride  - on hold since starting Ozempic   Recent home blood glucose readings - usually 100 to 120.    Current medication access support: Approved to receive Ozempic  in 2025 from [name].  Patient saw Dr Braulio 08/22/2023. She mentioned increasing Ozempic  to 1mg  weekly to help lower A1c. Her office submitted dose increase paperwork to [employer] but per patient today he has not received new Ozempic  dose yet.  He reports he still has gas pain every day - mostly when he eats greasy foods.   He is also seeing Dr Braulio for thyroid  nodule. Biopsy completed 03/2023. FINAL MICROSCOPIC DIAGNOSIS: Benign follicular nodule ([name]  Next appointment with Dr Braulio August 2025.  Assessed for Jardiance  medication assistance program in past but patient's household income was above program cut off today patient states he might qualify.   Weight before starting Ozempic  = 240 lbs BMI prior to starting Ozempic  = 40  Wt Readings from Last 3 Encounters:  09/25/23 237 lb (107.5 kg)  08/29/23 237 lb (107.5 kg)  08/07/23 238 lb 9.6 oz (108.2 kg)    Hyperlipidemia:  Taking atorvastatin  40mg  daily   Hypertension:  Current medications - losartan  100mg  daily and amlodipine  2.5mg   Patient checks blood pressure at home sometimes  but did not have any readings to report today  BP Readings from Last 3 Encounters:  08/29/23 120/68  08/07/23 120/66  06/26/23 136/80    Medication Access/Adherence  Current Pharmacy:  [facility] - [place], [place] - [address] [place] [address] Phone: [phone] Fax: [phone]  [id number] - [place], [place] - [address] [place] [address] Phone: [phone] Fax: [phone]   Patient reports affordability concerns with their medications: Yes  Patient reports access/transportation concerns to their pharmacy: No  Patient reports adherence concerns with their medications:  No      Objective:  Lab Results  Component Value Date   HGBA1C 8.7 (H) 03/28/2023    Lab Results  Component Value Date   CREATININE 1.4 (A) 06/11/2023   BUN 20 06/11/2023   NA 138 06/11/2023   K 4.6 06/11/2023   CL 102 06/11/2023   CO2 29 (A) 06/11/2023    Lab Results  Component Value Date   CHOL 103 06/06/2022   HDL 48.30 06/06/2022   LDLCALC 45 06/06/2022   TRIG 47.0 06/06/2022   CHOLHDL 2 06/06/2022    Medications Reviewed Today     Reviewed by Carla Milling, RPH-CPP (Pharmacist) on 11/12/23 at 1059  Med List Status: <None>   Medication Order Taking? Sig Documenting Provider Last Dose Status Informant  Accu-Chek FastClix Lancets MISC [id number] Yes USE TO CHECK BLOOD GLUCOSE ONCE A DAY Paz, Jose E, MD  Active   albuterol  (VENTOLIN  HFA)  108 (90 Base) MCG/ACT inhaler [id number] Yes Inhale 2 puffs into the lungs every 6 (six) hours as needed for wheezing or shortness of breath. Paz, Jose E, MD  Active   amLODipine  (NORVASC ) 2.5 MG tablet [id number] Yes Take 1 tablet (2.5 mg total) by mouth daily. Amon Aloysius BRAVO, MD  Active   aspirin  EC 81 MG tablet [id number] Yes Take 81 mg by mouth daily. Each morning. Swallow whole. [provider]  Active   atorvastatin  (LIPITOR) 40 MG tablet [id number] Yes Take 1  tablet (40 mg total) by mouth at bedtime. Paz, Jose E, MD  Active   BREO ELLIPTA  100-25 MCG/ACT AEPB [id number] Yes Inhale 1 puff into the lungs daily.  Patient taking differently: Inhale 1 puff into the lungs daily.   Paz, Jose E, MD  Active   dicyclomine  (BENTYL ) 20 MG tablet [id number] Yes Take 1 tablet (20 mg total) by mouth 3 (three) times daily before meals.  Patient taking differently: Take 20 mg by mouth daily.   Paz, Jose E, MD  Active   empagliflozin  (JARDIANCE ) 10 MG TABS tablet [id number] Yes Take 1 tablet (10 mg total) by mouth daily before breakfast. Paz, Jose E, MD  Active   escitalopram  (LEXAPRO ) 10 MG tablet [id number] Yes Take 1 tablet (10 mg total) by mouth daily. Paz, Jose E, MD  Active   famotidine  (PEPCID ) 20 MG tablet [id number] Yes Take 1 tablet (20 mg total) by mouth 2 (two) times daily. Paz, Jose E, MD  Active   Flaxseed, Linseed, (FLAXSEED OIL) 1000 MG CAPS [id number] Yes Take 2,000 mg by mouth daily.  [provider]  Active Self  fluticasone  (FLONASE ) 50 MCG/ACT nasal spray [id number] Yes Place 1 spray into both nostrils daily. Paz, Jose E, MD  Active   furosemide  (LASIX ) 40 MG tablet [id number] Yes Take 1 tablet (40 mg total) by mouth daily. Paz, Jose E, MD  Active   glimepiride  (AMARYL ) 2 MG tablet [id number] Yes Take 1 tablet (2 mg total) by mouth daily with breakfast. Amon Aloysius BRAVO, MD  Active   glucose blood (ACCU-CHEK GUIDE TEST) test strip [id number] Yes USE TO CHECK BLOOD GLUCOSE ONCE A DAY Paz, Jose E, MD  Active   losartan  (COZAAR ) 100 MG tablet [id number] Yes Take 1 tablet (100 mg total) by mouth daily. Paz, Jose E, MD  Active   meclizine  (ANTIVERT ) 12.5 MG tablet [id number] Yes Take 1 tablet (12.5 mg total) by mouth 3 (three) times daily as needed for dizziness. Saguier, [place], PA-C  Active            Med Note (CANTER, KAYLYN D   Wed Oct 18, 2022  2:11 PM) PRN  omeprazole  (PRILOSEC ) 20 MG capsule [id number] Yes Take 1 capsule (20 mg total) by mouth daily. Paz,  Jose E, MD  Active   Probiotic Product [place]) CHEW [id number] Yes Chew 2 each by mouth daily. [provider]  Active   psyllium (METAMUCIL) 58.6 % powder [id number] Yes Take 1 packet by mouth daily.  Patient taking differently: Take 1 packet by mouth every other day.   [provider]  Active   Semaglutide ,0.25 or 0.5MG /DOS, (OZEMPIC , 0.25 OR 0.5 MG/DOSE,) 2 MG/3ML SOPN [id number] Yes Inject 0.5 mg into the skin once a week. [provider]  Active            Med Note JUSTINO, Faron Whitelock B   Wed Jul 11, 2023  2:18 PM) [name] medication assistance program thru 04/16/2024  Assessment/Plan:   Diabetes: Currently uncontrolled  per last A1c. Due to recheck 11/2023 - Recommend to continue Jardiance  10mg  daily and Ozempic  0.5mg  weekly. Called [name] and updated paperwork has not been received for new dose of Ozempic  1mg  weekly. I will try reaching out to Dr Starlyn office again. Will print correct reorder / dose update form and fax to them since their office has taken over prescribing Ozempic .  - Continue to follow up with endocrinology, Dr Braulio for thyroid  nodule and diabetes - Recommend to check glucose 1 to 2 times per day - Meets financial criteria for Ozmepic patient assistance program through [name]. Application approved thry 04/16/2024.   Hyperlipidemia:  LDL and Tg are at goal - continue atorvastatin  - assisted with requesting refill at his pharmacy  Hypertension: office and home blood pressure have been at goal. - continue losartan  and amlodipine  - assisted with requesting refills at his pharmacy  Reviewed med list and refill history   Follow Up Plan: 4 weeks.   Madelin Ray, PharmD Clinical Pharmacist [facility]

## 2023-11-15 ENCOUNTER — Telehealth: Payer: Self-pay | Admitting: Pharmacist

## 2023-11-15 NOTE — Progress Notes (Signed)
   11/15/2023  Patient ID: Donald Jones, male   DOB: 05-30-1945, 78 y.o.   MRN: [id number]  Faxed proper application after speaking with Novo about what they would like. Attached original application with pages 7-10 as requested to input new provider information (Dr. Braulio). Selected Ozempic  1mg  for 4 boxes.   Will keep eye out for application approval.    Donald Jones, PharmD [facility]  Phone Number: [phone]

## 2023-11-16 DIAGNOSIS — G4733 Obstructive sleep apnea (adult) (pediatric): Secondary | ICD-10-CM | POA: Diagnosis not present

## 2023-11-27 ENCOUNTER — Ambulatory Visit: Payer: Self-pay | Admitting: Adult Health

## 2023-11-27 DIAGNOSIS — G4733 Obstructive sleep apnea (adult) (pediatric): Secondary | ICD-10-CM

## 2023-11-28 ENCOUNTER — Telehealth: Payer: Self-pay

## 2023-11-28 ENCOUNTER — Ambulatory Visit: Payer: Self-pay | Admitting: Adult Health

## 2023-11-28 NOTE — Telephone Encounter (Signed)
 Patient has been approved to receive Ozempic  1mg  pens through Novo through 04/16/2024. First shipment expected within 10-14 business days (3 weeks). Patient has 7 weeks of Ozempic  0.5mg  left on hand if he gives himself 2 shots each week to total 1mg , would like to go ahead and increase.  Okay to give 2 injections, counseled to use different pen needle for each stick. Recommended patient call back if he has not received Ozempic  1mg  by 12/21/23 so we can attempt to expedite order. Patient expressed understanding.

## 2023-11-28 NOTE — Telephone Encounter (Addendum)
 Awaiting provider signature for DME order. Once, signed, will send order for CPAP. Donald Jones has a follow-up with Tammy scheduled for 11/13. Patient is aware, printed and mailed reminder. Called patient to inform him we are awaiting provider signature. Once signed the order will be sent to DME. Patient is aware, and the process was explained.

## 2023-11-28 NOTE — Telephone Encounter (Signed)
 Melvenia Wilford SAUND[place], [place]    11/28/23  8:58 AM Result Note ATCx1 Home sleep test Parrett, Madelin RAMAN, NP to [facility]    11/27/23  6:10 PM Result Note Sleep study shows moderate sleep apnea -order for new CPAP  Please make sure has ov in 3-4 months  Rx for CPAP 8-13cmH2o.  Home sleep test   I called and spoke with the pt and notified of results  He verbalized understanding  Order for CPAP was placed and appt scheduled

## 2023-11-28 NOTE — Telephone Encounter (Signed)
 Copied from CRM [id number]. Topic: Clinical - Medication Question >> Nov 28, 2023 11:58 AM Donald Jones wrote: Reason for CRM: Patient would like a call back from [place]. He would like to know about Ozempic  and if he needs to take two shots. The paperwork had come in for the approval to take two and he just wants to make sure it's okay to do that.   [phone] (H)

## 2023-11-28 NOTE — Telephone Encounter (Signed)
 Patient called back for his CPAP results and they were read to him verbatim:  Parrett, Madelin RAMAN, NP to [facility]    11/27/23  6:10 PM Result Note Sleep study shows moderate sleep apnea -order for new CPAP  Please make sure has ov in 3-4 months  Rx for CPAP 8-13cmH2o.    Patient states that he has an old one and asked if this new one would be set---This RN called CAL and warm transferred patient for further assistance on getting this new CPAP and follow up appointment set up.                       Copied from CRM [id number]. Topic: Clinical - Lab/Test Results >> Nov 28, 2023 11:12 AM Nathanel DEL wrote: Reason for CRM: pt returning call for cpap results. Reason for Disposition . [1] Caller requesting NON-URGENT health information AND [2] PCP's office is the best resource  Answer Assessment - Initial Assessment Questions Patient called back for his CPAP results and they were read to him verbatim:   Parrett, Madelin RAMAN, NP to [facility]     11/27/23  6:10 PM Result Note Sleep study shows moderate sleep apnea -order for new CPAP  Please make sure has ov in 3-4 months  Rx for CPAP 8-13cmH2o.      Patient states that he has an old one and asked if this new one would be set---This RN called [facility] and warm transferred patient for further assistance on getting this new CPAP and follow up appointment set up.  Protocols used: Information Only Call - No Triage-A-AH

## 2023-11-28 NOTE — Telephone Encounter (Signed)
 PCC's, can you look into this? Madelin Stank, NP sent this in yesterday so it may just be processing through insurance.

## 2023-11-29 NOTE — Telephone Encounter (Addendum)
 Received provider signature and sent to [facility]. Order is now in processing. No further action needed at this time.   New, Adine Mechanic, Silvano ALECK Joylene Carlean Jackson Avelina; Tucker, Dolanda; Cain, Groom, Adine Mechanic Silvano ALECK Joylene, Carlean Jackson Avelina; Tucker, Dolanda; Cain, Mitchell Received, thank you!

## 2023-12-12 ENCOUNTER — Ambulatory Visit (INDEPENDENT_AMBULATORY_CARE_PROVIDER_SITE_OTHER): Admitting: Pharmacist

## 2023-12-12 DIAGNOSIS — Z7985 Long-term (current) use of injectable non-insulin antidiabetic drugs: Secondary | ICD-10-CM

## 2023-12-12 DIAGNOSIS — I10 Essential (primary) hypertension: Secondary | ICD-10-CM

## 2023-12-12 DIAGNOSIS — E7800 Pure hypercholesterolemia, unspecified: Secondary | ICD-10-CM

## 2023-12-12 DIAGNOSIS — J4520 Mild intermittent asthma, uncomplicated: Secondary | ICD-10-CM

## 2023-12-12 DIAGNOSIS — E119 Type 2 diabetes mellitus without complications: Secondary | ICD-10-CM

## 2023-12-12 NOTE — Progress Notes (Signed)
 12/12/2023 Name: Donald Jones MRN: [id number] DOB: 02-27-1946  Chief Complaint  Patient presents with   Diabetes   Medication Management    Donald Jones is a 78 y.o. year old male who presented for a phone visit   They were referred to the pharmacist by their PCP for assistance in managing diabetes and medication access.    Subjective:  Diabetes:   Current medications: Jardiance  10mg  daily; Ozempic  1mg  once a week (he is injecting 0.5mg  x 2 until he uses up his current supply and then will change to 1mg  strength). He has a delivery at his endo office of Ozempic  1mg  to pick up  Glimepiride  2mg  - supposed to be on hold but patient reports he somehow restarted. He reports that he has had more low blood glucose events where he feels sweaty and shaky.   Recent home blood glucose readings - usually FBG is 110's to 130's. He has had a few reading that were higher - earlier this week had FBG of 169 and 159. He fells like nighttime snacks might have caused highs.    Current medication access support: Approved to receive Ozempic  in 2025 from [name].    He is also seeing Dr Braulio for thyroid  nodule. Biopsy completed 03/2023. FINAL MICROSCOPIC DIAGNOSIS: Benign follicular nodule ([name]   Assessed for Jardiance  medication assistance program in past but patient's household income was above program cut off   Weight before starting Ozempic  = 240 lbs BMI prior to starting Ozempic  = 40  Wt Readings from Last 3 Encounters:  09/25/23 237 lb (107.5 kg)  08/29/23 237 lb (107.5 kg)  08/07/23 238 lb 9.6 oz (108.2 kg)   Patient reports that he has not been taking gabapentin  300mg  (I don't see it on his list but he had a bottle at home). He states that the 300mg  dose made him very dizzy and disoriented. He usually has tingling when he wakes from sleeping / nap. Arm will get numb but improved with position change. No other times during the day is he having numbness or  suspected neuropathy.    Hyperlipidemia:  Taking atorvastatin  40mg  daily   Hypertension:  Current medications - losartan  100mg  daily and amlodipine  2.5mg   Patient checks blood pressure at home - checked 2 days ago and blood pressure was 120/70  BP Readings from Last 3 Encounters:  08/29/23 120/68  08/07/23 120/66  06/26/23 136/80    Medication Access/Adherence  Current Pharmacy:  [facility] - [place], [place] - [address] [place] [place] [address] Phone: [phone] Fax: [phone]  [id number] - [place], [place] - [address] [place] [address] Phone: [phone] Fax: [phone]   Patient reports affordability concerns with their medications: Yes  Patient reports access/transportation concerns to their pharmacy: No  Patient reports adherence concerns with their medications:  No      Asthma:  Current medications: Breo once a day (patient taking as needed) and albuterol  as needed.   Patient states he will take Breo during the spring and fall when he has more allergy symptoms but he also mentioned that he feels like the last time that he too [place] it made it more difficult for him to breath.   Reports no exacerbations in the past year  Objective:  Lab Results  Component Value Date   HGBA1C 8.7 (H) 03/28/2023    Lab Results  Component Value Date   CREATININE 1.4 (A)  06/11/2023   BUN 20 06/11/2023   NA 138 06/11/2023   K 4.6 06/11/2023   CL 102 06/11/2023   CO2 29 (A) 06/11/2023    Lab Results  Component Value Date   CHOL 103 06/06/2022   HDL 48.30 06/06/2022   LDLCALC 45 06/06/2022   TRIG 47.0 06/06/2022   CHOLHDL 2 06/06/2022    Medications Reviewed Today     Reviewed by Carla Milling, RPH-CPP (Pharmacist) on 12/12/23 at 1036  Med List Status: <None>   Medication Order Taking? Sig Documenting Provider Last Dose Status Informant  Accu-Chek FastClix Lancets MISC [id number] Yes  USE TO CHECK BLOOD GLUCOSE ONCE A DAY Paz, Jose E, MD  Active   albuterol  (VENTOLIN  HFA) 108 [phone] Base) MCG/ACT inhaler [id number] Yes Inhale 2 puffs into the lungs every 6 (six) hours as needed for wheezing or shortness of breath. Paz, Jose E, MD  Active   amLODipine  (NORVASC ) 2.5 MG tablet [id number] Yes Take 1 tablet (2.5 mg total) by mouth daily. Amon Aloysius BRAVO, MD  Active   aspirin  EC 81 MG tablet [id number] Yes Take 81 mg by mouth daily. Each morning. Swallow whole. [provider]  Active   atorvastatin  (LIPITOR) 40 MG tablet [id number] Yes Take 1 tablet (40 mg total) by mouth at bedtime. Paz, Jose E, MD  Active   BREO ELLIPTA  100-25 MCG/ACT AEPB [id number] Yes Inhale 1 puff into the lungs daily.  Patient taking differently: Inhale 1 puff into the lungs daily.   Paz, Jose E, MD  Active   dicyclomine  (BENTYL ) 20 MG tablet [id number] Yes Take 1 tablet (20 mg total) by mouth 3 (three) times daily before meals. Paz, Jose E, MD  Active   empagliflozin  (JARDIANCE ) 10 MG TABS tablet [id number] Yes Take 1 tablet (10 mg total) by mouth daily before breakfast. Paz, Jose E, MD  Active   escitalopram  (LEXAPRO ) 10 MG tablet [id number] Yes Take 1 tablet (10 mg total) by mouth daily. Paz, Jose E, MD  Active   famotidine  (PEPCID ) 20 MG tablet [id number] Yes Take 1 tablet (20 mg total) by mouth 2 (two) times daily. Paz, Jose E, MD  Active   Flaxseed, Linseed, (FLAXSEED OIL) 1000 MG CAPS [id number] Yes Take 2,000 mg by mouth daily.  [provider]  Active Self  fluticasone  (FLONASE ) 50 MCG/ACT nasal spray [id number] Yes Place 1 spray into both nostrils daily. Paz, Jose E, MD  Active   furosemide  (LASIX ) 40 MG tablet [id number] Yes Take 1 tablet (40 mg total) by mouth daily. Paz, Jose E, MD  Active   glimepiride  (AMARYL ) 2 MG tablet [id number] Yes Take 1 tablet (2 mg total) by mouth daily with breakfast. Amon Aloysius BRAVO, MD  Active   glucose blood (ACCU-CHEK GUIDE TEST) test strip [id number] Yes USE TO CHECK BLOOD  GLUCOSE ONCE A DAY Paz, Jose E, MD  Active   losartan  (COZAAR ) 100 MG tablet [id number] Yes Take 1 tablet (100 mg total) by mouth daily. Paz, Jose E, MD  Active   meclizine  (ANTIVERT ) 12.5 MG tablet [id number]  Take 1 tablet (12.5 mg total) by mouth 3 (three) times daily as needed for dizziness. Saguier, [place], PA-C  Active            Med Note (CANTER, KAYLYN D   Wed Oct 18, 2022  2:11 PM) PRN  omeprazole  (PRILOSEC ) 20 MG capsule [id number]  Take 1 capsule (20 mg total) by mouth daily. Paz, Jose E, MD  Active   Probiotic Product (  CLORETTA PURL [id number] Yes Chew 2 each by mouth daily. [provider]  Active   psyllium (METAMUCIL) 58.6 % powder [id number] Yes Take 1 packet by mouth daily.  Patient taking differently: Take 1 packet by mouth as needed.   [provider]  Active     Discontinued 12/12/23 1018 (Dose change)            Med Note JUSTINO, Denai Caba B   Wed Jul 11, 2023  2:18 PM) [name] medication assistance program thru 04/16/2024              Assessment/Plan:   Diabetes: Currently uncontrolled  per last A1c. Due to recheck soon - Recommend to continue Jardiance  10mg  daily and Ozempic  1mg  weekly.  - Recommended since he was having some symptoms of low blood glucose that he hold glimepiride  again (had been instructed to hold in the past when he started Ozempic )  - Continue to follow up with endocrinology, Dr Braulio for thyroid  nodule and diabetes - Discuss lower carbohydrate / higher protein snack ideas. Recommended he substitute water for juice (or do half juice and half water).  - Recommend to check glucose 1 to 2 times per day - Meets financial criteria for Ozmepic patient assistance program through [name]. Application approved thry 04/16/2024.   Hyperlipidemia:  LDL and Tg are at goal - continue atorvastatin   Hypertension: office and home blood pressure have been at goal. - continue losartan  and amlodipine   Asthma: - Discussed the different  between maintenance and rescue inhalers.  - Encouraged patient to retry taking Breo on a daily basis - 1 puff daily  - Continue to follow up with pulmonology - next appointment is November 2025.   Medication management:  -Reviewed med list and refill history - regarding gabapentin  - patient was not able to tolerate the 300mg  strength. I think that his neuropathy symptoms sound more related to circulation / position when sleeping. Discussed symptoms of neuropathy. Patient to monitor for symptoms and if he is experience true neuropathy will discuss with Dr Amon in October - could try low dose gabapentin  100mg  at night and increase as needed  / tolerated.     Follow Up Plan: 8 weeks.   Madelin Ray, PharmD Clinical Pharmacist [facility]

## 2023-12-26 DIAGNOSIS — E0590 Thyrotoxicosis, unspecified without thyrotoxic crisis or storm: Secondary | ICD-10-CM | POA: Diagnosis not present

## 2023-12-26 DIAGNOSIS — G4733 Obstructive sleep apnea (adult) (pediatric): Secondary | ICD-10-CM | POA: Diagnosis not present

## 2023-12-26 DIAGNOSIS — N183 Chronic kidney disease, stage 3 unspecified: Secondary | ICD-10-CM | POA: Diagnosis not present

## 2023-12-26 DIAGNOSIS — E1165 Type 2 diabetes mellitus with hyperglycemia: Secondary | ICD-10-CM | POA: Diagnosis not present

## 2023-12-26 DIAGNOSIS — E041 Nontoxic single thyroid nodule: Secondary | ICD-10-CM | POA: Diagnosis not present

## 2023-12-26 DIAGNOSIS — D751 Secondary polycythemia: Secondary | ICD-10-CM | POA: Diagnosis not present

## 2023-12-26 DIAGNOSIS — I10 Essential (primary) hypertension: Secondary | ICD-10-CM | POA: Diagnosis not present

## 2023-12-26 DIAGNOSIS — E559 Vitamin D deficiency, unspecified: Secondary | ICD-10-CM | POA: Diagnosis not present

## 2023-12-26 LAB — HEPATIC FUNCTION PANEL
ALT: 31 U/L (ref 10–40)
AST: 24 (ref 14–40)
Alkaline Phosphatase: 102 (ref 25–125)
Bilirubin, Total: 1.4

## 2023-12-26 LAB — VITAMIN D 25 HYDROXY (VIT D DEFICIENCY, FRACTURES): Vit D, 25-Hydroxy: 32.9

## 2023-12-26 LAB — COMPREHENSIVE METABOLIC PANEL WITH GFR
Albumin: 4.1 (ref 3.5–5.0)
Calcium: 9.9 (ref 8.7–10.7)
eGFR: 54

## 2023-12-26 LAB — BASIC METABOLIC PANEL WITH GFR
BUN: 19 (ref 4–21)
CO2: 29 — AB (ref 13–22)
Chloride: 102 (ref 99–108)
Creatinine: 1.4 — AB (ref 0.6–1.3)
Glucose: 148
Potassium: 4.2 meq/L (ref 3.5–5.1)
Sodium: 138 (ref 137–147)

## 2023-12-26 LAB — TSH: TSH: 0.32 — AB (ref 0.41–5.90)

## 2023-12-26 LAB — CBC AND DIFFERENTIAL
HCT: 48 (ref 41–53)
Hemoglobin: 16.6 (ref 13.5–17.5)
Platelets: 192 K/uL (ref 150–400)
WBC: 7.1

## 2023-12-26 LAB — HEMOGLOBIN A1C: Hemoglobin A1C: 6.6

## 2023-12-26 LAB — CBC: RBC: 5.22 — AB (ref 3.87–5.11)

## 2023-12-28 ENCOUNTER — Ambulatory Visit: Admitting: Internal Medicine

## 2024-01-01 ENCOUNTER — Telehealth: Payer: Self-pay

## 2024-01-01 NOTE — Telephone Encounter (Signed)
 Copied from CRM [id number]. Topic: Clinical - Medication Question >> Jan 01, 2024 10:34 AM Maisie C wrote: Reason for CRM: pt called in requested to speak with Tammy directly regarding his CPAP machine. Please call and advise at [phone].

## 2024-01-01 NOTE — Telephone Encounter (Signed)
 Tried to call patient back. LM on VM with CB# [phone]

## 2024-01-03 NOTE — Telephone Encounter (Signed)
 Tried to call patient again - 2nd unsuccessful outreach. LM on VM with CB# [phone]

## 2024-01-04 ENCOUNTER — Other Ambulatory Visit: Payer: Self-pay | Admitting: Internal Medicine

## 2024-01-04 NOTE — Telephone Encounter (Signed)
 Pt now seeing Dr. Braulio at [place] for diabetes management.

## 2024-01-07 ENCOUNTER — Telehealth: Payer: Self-pay | Admitting: Adult Health

## 2024-01-07 NOTE — Telephone Encounter (Signed)
 I called and spoke with patient, he states that he has received several messages from different people, one was calling from [name]  and wanted to make sure he knew where we sent the order.  I advised him that we sent the order to [facility].  He said he wife fell and broke her hip and was now in rehab and was not getting his messages until late and they were closed by the time he got home from visiting her at rehab.  He states he will try to call them today.  Nothing further needed.

## 2024-01-07 NOTE — Telephone Encounter (Signed)
 Copied from CRM [id number]. Topic: Clinical - Order For Equipment >> Jan 07, 2024  8:52 AM Isabell A wrote: Reason for CRM: Patient is requesting a call back in regard to CPAP machine - he has 2-3 differnet companies thats been calling him & hes not sure who he should be calling back.    Callback number: [phone]

## 2024-01-08 ENCOUNTER — Other Ambulatory Visit: Payer: Self-pay | Admitting: Internal Medicine

## 2024-01-11 ENCOUNTER — Telehealth: Payer: Self-pay | Admitting: Internal Medicine

## 2024-01-11 NOTE — Telephone Encounter (Signed)
 Copied from CRM #[id number]. Topic: Clinical - Order For Equipment >> Jan 11, 2024 10:42 AM Donald Jones wrote: Reason for CRM:  Pt calling b/c he has not heard  anything about his CPAP. Chart states ordered 11/27/2023. When verifying pt's info, his correct phone number was not in the chart. Only house phone which is not in service at this time. Called [facility] and they had tried to contact him 6 times. (On house phone) Updates were made, and [facility] told me to let the pt know to expect a call from them today, but no later than Monday. (Within 48 hrs) to set the appt for his cpap. Called pt and let him know. Pt was grateful and excited to be getting his cpap now.  Nothing further needed.  Spoke w/ Davion at [facility], then Natalia in the cpap dept .

## 2024-01-21 ENCOUNTER — Encounter: Payer: Self-pay | Admitting: Internal Medicine

## 2024-01-21 ENCOUNTER — Ambulatory Visit: Admitting: Internal Medicine

## 2024-01-21 VITALS — BP 132/66 | HR 72 | Temp 97.6°F | Resp 16 | Ht 65.0 in | Wt 233.0 lb

## 2024-01-21 DIAGNOSIS — Z7985 Long-term (current) use of injectable non-insulin antidiabetic drugs: Secondary | ICD-10-CM

## 2024-01-21 DIAGNOSIS — E0590 Thyrotoxicosis, unspecified without thyrotoxic crisis or storm: Secondary | ICD-10-CM

## 2024-01-21 DIAGNOSIS — N1831 Chronic kidney disease, stage 3a: Secondary | ICD-10-CM | POA: Diagnosis not present

## 2024-01-21 DIAGNOSIS — N183 Chronic kidney disease, stage 3 unspecified: Secondary | ICD-10-CM | POA: Insufficient documentation

## 2024-01-21 DIAGNOSIS — S0340XA Sprain of jaw, unspecified side, initial encounter: Secondary | ICD-10-CM | POA: Diagnosis not present

## 2024-01-21 DIAGNOSIS — Z125 Encounter for screening for malignant neoplasm of prostate: Secondary | ICD-10-CM | POA: Diagnosis not present

## 2024-01-21 DIAGNOSIS — E7800 Pure hypercholesterolemia, unspecified: Secondary | ICD-10-CM

## 2024-01-21 DIAGNOSIS — Z23 Encounter for immunization: Secondary | ICD-10-CM

## 2024-01-21 DIAGNOSIS — E11620 Type 2 diabetes mellitus with diabetic dermatitis: Secondary | ICD-10-CM | POA: Diagnosis not present

## 2024-01-21 DIAGNOSIS — E0822 Diabetes mellitus due to underlying condition with diabetic chronic kidney disease: Secondary | ICD-10-CM | POA: Diagnosis not present

## 2024-01-21 DIAGNOSIS — I10 Essential (primary) hypertension: Secondary | ICD-10-CM | POA: Diagnosis not present

## 2024-01-21 LAB — MICROALBUMIN / CREATININE URINE RATIO
Creatinine,U: 76.1 mg/dL
Microalb Creat Ratio: UNDETERMINED mg/g (ref 0.0–30.0)
Microalb, Ur: <0.7 mg/dL

## 2024-01-21 LAB — PSA: PSA: 1.78 ng/mL (ref 0.10–4.00)

## 2024-01-21 NOTE — Patient Instructions (Addendum)
 GO TO THE LAB :  Get the blood work    Then, go to the front desk for the checkout Please make an appointment in 4 months   You got a flu shot Recommend a COVID-vaccine  Check the  blood pressure regularly Blood pressure goal:  between 110/65 and  135/85. If it is consistently higher or lower, let me know  Diabetes: Reach out to endocrinology  LEFT EAR PAIN IS LIKELY FROM TEMPOROMANDIBULAR JOINT:  -Avoid taking large bites and chewing gum. -Consider seeing a dentist if the pain continues.  Please read more detailed instructions below   TYPE 2 DIABETES MELLITUS:  -Continue taking your current diabetes medications. -Follow up with your endocrinologist regarding your elevated blood sugar and gastrointestinal side effects.  HYPERTENSION: Your blood pressure readings at home are within an acceptable range. -Continue taking your current antihypertensive medications. -Keep monitoring your blood pressure at home regularly.  HYPERLIPIDEMIA: Your cholesterol levels are being managed with atorvastatin . -Continue taking atorvastatin  40 mg daily. -Get a fasting lipid panel (FLP) test.  ASTHMA: Your asthma is well controlled with Breo Ellipta  as needed. -Continue using Breo Ellipta  as needed.     IMMUNIZATION: You are due for a flu shot and a COVID booster. -You received your flu shot today. -Get a COVID booster.

## 2024-01-21 NOTE — Assessment & Plan Note (Signed)
 DM with CKD Increase in semaglutide  (Ozempic ) to 1 mg causing GI s/e  including burping and constipation. Last A1c few weeks ago was 6.6.  Recommend to discuss s/e w/ endocrine HTN: Blood pressure readings at home are within acceptable range, with recent readings of 139/80 mmHg and 132/66 mmHg. - ContinueAmlodipine, losartan , CKD: Last creatinine 1.4.  Stable Hyperlipidemia Currently managed with atorvastatin  40 mg daily.  No change, FLP Asthma: Well controlled with Breo Ellipta  as needed. Thyroid  nodule:  Continue to follow with endocrinology Left temporomandibular joint pain Pain in the L TMJ,  rec  conservative treatment including avoiding large bites and no chewing gum.  See dentist if symptoms persist preventive care: Prostate cancer screening: No symptoms, check PSA. Colon cancer screening: Patient is hesitant to proceed due to a number of reasons, he reports has no major problems.  He is 89, at this point screening is optional.  He will let me know if he likes to proceed at some point - Administer flu shot today - Recommend COVID booster RTC 4 months

## 2024-01-21 NOTE — Progress Notes (Signed)
 Subjective:    Patient ID: Donald Jones, male    DOB: 1946/02/12, 78 y.o.   MRN: [id number]  DOS:  01/21/2024 Discussed the use of AI scribe software for clinical note transcription with the patient, who gave verbal consent to proceed.  History of Present Illness Follow-up  Gastrointestinal symptoms - Increased gastrointestinal symptoms since Ozempic  (semaglutide ) dose increased to 1 mg - Frequent burping and stomach upset present - Constipation present, with some relief from stool softeners  Otolaryngologic and orofacial pain - Left-sided ear and jaw pain  - Pain occurs spontaneously and while chewing.  No exertional symptoms  Glycemic control - Diabetes managed with semaglutide , glimepiride , and Jardiance  - Recent elevated blood glucose levels: 169 mg/dL and 799 mg/dL  Blood pressure monitoring - Home blood pressure readings around 139/80 mmHg and 132/66 mmHg  Cardiopulmonary and peripheral symptoms - No chest pain - No shortness of breath - No leg swelling  Genitourinary symptoms - No urinary symptoms     Review of Systems See above   Past Medical History:  Diagnosis Date   Anxiety    Arthritis    hands right   Diabetes mellitus without complication (HCC)    GERD (gastroesophageal reflux disease)    Hypertension    Kidney stones    several episodes.  Last one in 2011.     Obesity    100-lb weight loss since 2011.    Polycythemia    Sleep apnea    on Cpap    Past Surgical History:  Procedure Laterality Date   CHOLECYSTECTOMY     HAND SURGERY  2010   R hand , d/t a injury, 3 surgeries     Current Outpatient Medications  Medication Instructions   Accu-Chek FastClix Lancets MISC USE TO CHECK BLOOD GLUCOSE ONCE A DAY   albuterol  (VENTOLIN  HFA) 108 (90 Base) MCG/ACT inhaler 2 puffs, Inhalation, Every 6 hours PRN   amLODipine  (NORVASC ) 2.5 mg, Oral, Daily   aspirin  EC 81 mg, Daily   atorvastatin  (LIPITOR) 40 mg, Oral, Daily at bedtime   BREO  ELLIPTA 100-25 MCG/ACT AEPB 1 puff, Inhalation, Daily   dicyclomine  (BENTYL ) 20 mg, Oral, 3 times daily before meals   empagliflozin  (JARDIANCE ) 10 mg, Oral, Daily before breakfast   escitalopram  (LEXAPRO ) 10 mg, Oral, Daily   famotidine  (PEPCID ) 20 mg, Oral, 2 times daily   Flaxseed Oil 2,000 mg, Daily   fluticasone  (FLONASE ) 50 MCG/ACT nasal spray 1 spray, Each Nare, Daily   furosemide  (LASIX ) 40 mg, Oral, Daily   glimepiride  (AMARYL ) 2 mg, Oral, Daily with breakfast   glucose blood (ACCU-CHEK GUIDE TEST) test strip USE TO CHECK BLOOD GLUCOSE ONCE A DAY   losartan  (COZAAR ) 100 mg, Oral, Daily   meclizine  (ANTIVERT ) 12.5 mg, Oral, 3 times daily PRN   omeprazole  (PRILOSEC ) 20 mg, Oral, Daily   Ozempic  (1 MG/DOSE) 1 mg, Weekly   Probiotic Product (ALIGN) CHEW 2 each, Daily   psyllium (METAMUCIL) 58.6 % powder 1 packet, Daily       Objective:   Physical Exam BP 132/66   Pulse 72   Temp 97.6 F (36.4 C) (Oral)   Resp 16   Ht 5' 5 (1.651 m)   Wt 233 lb (105.7 kg)   SpO2 97%   BMI 38.77 kg/m  General:   Well developed, NAD, BMI noted. HEENT:  Normocephalic . Face symmetric, atraumatic TMJ: No click, some discomfort at the left TMJ with opening and closing the mouth. Ears: Normal.  Lungs:  CTA B Normal respiratory effort, no intercostal retractions, no accessory muscle use. Heart: RRR,  no murmur.  Lower extremities: no pretibial edema bilaterally  Skin: Not pale. Not jaundice Neurologic:  alert & oriented X3.  Speech normal, gait at baseline, history of R foot drop  psych--  Cognition and judgment appear intact.  Cooperative with normal attention span and concentration.  Behavior appropriate. No anxious or depressed appearing.      Assessment     ASSESSMENT DM--Intolerant to Metformin , see office visit 09/09/2019; actos  d/c 2024 d/t edema  HTN Polycythemia- sees hematology, likely d/t OSA-diuretics, JAK2 (-) ~ 2014, last OV 10-2014, f/u prn OSA on CPAP Asthma   Anxiety (lexapro  prn) DJD- saw Dr Sheril before  R Foot drop  GI: -GERD - IBS? GI sx on and off, previously dx w/ IBS -Cscopes: 7995,[id number], 09/18/2017 - EGD 04/2018, showed gastritis, no H. pylori, no malignancy Urolithiasis, several episodes CV: Sees cardiology, negative Myoview  2019, CT coronary angiography 08/2019: Minimal disease Chronic dermatitis: Lower extremities, pretibial Subclinical hyperthyroidism:   thyroid  nodules per US  05-2021, saw endo,Dr. Braulio, BX 03/2023 benign   Assessment & Plan DM with CKD Increase in semaglutide  (Ozempic ) to 1 mg causing GI s/e  including burping and constipation. Last A1c few weeks ago was 6.6.  Recommend to discuss s/e w/ endocrine HTN: Blood pressure readings at home are within acceptable range, with recent readings of 139/80 mmHg and 132/66 mmHg. - ContinueAmlodipine, losartan , CKD: Last creatinine 1.4.  Stable Hyperlipidemia Currently managed with atorvastatin  40 mg daily.  No change, FLP Asthma: Well controlled with Breo Ellipta  as needed. Thyroid  nodule:  Continue to follow with endocrinology Left temporomandibular joint pain Pain in the L TMJ,  rec  conservative treatment including avoiding large bites and no chewing gum.  See dentist if symptoms persist preventive care: Prostate cancer screening: No symptoms, check PSA. Colon cancer screening: Patient is hesitant to proceed due to a number of reasons, he reports has no major problems.  He is 36, at this point screening is optional.  He will let me know if he likes to proceed at some point - Administer flu shot today - Recommend COVID booster RTC 4 months

## 2024-01-22 LAB — LIPID PANEL
Cholesterol: 110 mg/dL (ref 0–200)
HDL: 43.9 mg/dL (ref >39.00)
LDL Cholesterol: 52 mg/dL (ref 0–99)
NonHDL: 65.72
Total CHOL/HDL Ratio: 2
Triglycerides: 70 mg/dL (ref 0.0–149.0)
VLDL: 14 mg/dL (ref 0.0–40.0)

## 2024-01-23 ENCOUNTER — Ambulatory Visit: Admitting: Podiatry

## 2024-01-23 ENCOUNTER — Encounter: Payer: Self-pay | Admitting: Podiatry

## 2024-01-23 ENCOUNTER — Ambulatory Visit: Payer: Self-pay | Admitting: Internal Medicine

## 2024-01-23 ENCOUNTER — Other Ambulatory Visit: Payer: Self-pay | Admitting: Internal Medicine

## 2024-01-23 DIAGNOSIS — B351 Tinea unguium: Secondary | ICD-10-CM

## 2024-01-23 DIAGNOSIS — E11620 Type 2 diabetes mellitus with diabetic dermatitis: Secondary | ICD-10-CM

## 2024-01-23 DIAGNOSIS — M79675 Pain in left toe(s): Secondary | ICD-10-CM

## 2024-01-23 DIAGNOSIS — M79674 Pain in right toe(s): Secondary | ICD-10-CM

## 2024-01-23 NOTE — Progress Notes (Addendum)
 This patient presents to the office with chief complaint of long thick painful nails.  Patient says the nails are painful walking and wearing shoes.  This patient is unable to self treat.  This patient is unable to trim his nails since he is unable to reach his nails.  He is diagnosed with venous stasis.  he presents to the office for preventative foot care services.  General Appearance  Alert, conversant and in no acute stress.  Vascular  Dorsalis pedis and posterior tibial  pulses are absent  bilaterally.  Capillary return is within normal limits  bilaterally. Temperature is within normal limits  bilaterally.  Neurologic  Senn-Weinstein monofilament wire test within normal limits  bilaterally. Muscle power within normal limits bilaterally.  Nails Thick disfigured discolored nails with subungual debris  from hallux to fifth toes bilaterally. No evidence of bacterial infection or drainage bilaterally.  Orthopedic  No limitations of motion  feet .  No crepitus or effusions noted.  No bony pathology or digital deformities noted.  Skin  normotropic skin with no porokeratosis noted bilaterally.  No signs of infections or ulcers noted.     Onychomycosis  Nails  B/L.  Pain in right toes  Pain in left toes  Debridement of nails both feet followed trimming the nails with dremel tool.    RTC 3 months.   Cordella Bold DPM  tomma

## 2024-01-28 ENCOUNTER — Other Ambulatory Visit: Payer: Self-pay | Admitting: Internal Medicine

## 2024-01-28 ENCOUNTER — Other Ambulatory Visit: Payer: Self-pay | Admitting: Medical

## 2024-01-28 DIAGNOSIS — R600 Localized edema: Secondary | ICD-10-CM

## 2024-01-28 NOTE — Progress Notes (Unsigned)
 Donald Jones                                          MRN: [id number]   01/28/2024   The VBCI Quality Team Specialist reviewed this patient medical record for the purposes of chart review for care gap closure. The following were reviewed: SABRA    VBCI Quality Team abstraction for care gap closure-kidney health evaluation for diabetes:{CHL VBCI QUALITY SPECIALIST CARE GAP SUB MEASURES:[phone]}

## 2024-01-30 ENCOUNTER — Telehealth: Payer: Self-pay | Admitting: Pharmacy Technician

## 2024-01-30 NOTE — Progress Notes (Signed)
   01/30/2024  Patient ID: Donald Jones, male   DOB: [birth year], 78 y.o.   MRN: [id number]  Patient engaged with clinical pharmacist for management of diabetes on 12/12/2023. Outreach by [employer] technician was requested.   Outreached patient to discuss diabetes medication management. Left voicemail for patient to return my call at their convenience.    Shakerra Red, CPhT [facility] Office: [phone] Email: [email or web address]

## 2024-02-04 ENCOUNTER — Other Ambulatory Visit: Payer: Self-pay | Admitting: Internal Medicine

## 2024-02-06 ENCOUNTER — Ambulatory Visit: Admitting: Pharmacist

## 2024-02-06 DIAGNOSIS — I10 Essential (primary) hypertension: Secondary | ICD-10-CM

## 2024-02-06 DIAGNOSIS — E11620 Type 2 diabetes mellitus with diabetic dermatitis: Secondary | ICD-10-CM

## 2024-02-06 DIAGNOSIS — E7800 Pure hypercholesterolemia, unspecified: Secondary | ICD-10-CM

## 2024-02-06 MED ORDER — ATORVASTATIN CALCIUM 40 MG PO TABS: 40.0000 mg | ORAL_TABLET | Freq: Every day | ORAL | 1 refills | Status: AC

## 2024-02-06 NOTE — Progress Notes (Signed)
 02/06/2024 Name: Donald Jones MRN: [id number] DOB: 1946-01-21  Chief Complaint  Patient presents with   Medication Management   Diabetes    Donald Jones is a 78 y.o. year old male who presented for a phone visit   They were referred to the pharmacist by their PCP for assistance in managing diabetes and medication access.    Subjective:  Medicaton Access: He is currently receiving Ozempic  from [name] thru 04/16/2024.  He is also using Medicare Payment Plan - he pays a set amount thru [name] per month - about $150. He pays $0 for medications at the pharmacy.  We have assessed in past for Jardiance  but he was above income cut off  Diabetes:   Current medications:  Jardiance  10mg  daily Ozempic  0.5mg  weekly. He tried higher dose 1mg  weekly but had some side effects so Dr Braulio lowered dose back to 0.5mg  weekly  Glimepiride  2mg  - once a day with breakfast. Restarted when he lowered dose of Ozempic   Recent home blood glucose readings - today was 146; recently 160's but was as high as 200 when he lowered dose of Ozempic  but had not restarted glimepiride  yet.    Current medication access support: Approved to receive Ozempic  in 2025 from [name]. Expires 04/16/2024   He is also seeing Dr Braulio for thyroid  nodule. Biopsy completed 03/2023. FINAL MICROSCOPIC DIAGNOSIS: Benign follicular nodule ([name]   Assessed for Jardiance  medication assistance program in past but patient's household income was above program cut off.   Weight before starting Ozempic  = 240 lbs BMI prior to starting Ozempic  = 40 Has lost about 7lbs total  Wt Readings from Last 3 Encounters:  01/21/24 233 lb (105.7 kg)  09/25/23 237 lb (107.5 kg)  08/29/23 237 lb (107.5 kg)    Hyperlipidemia:  Taking atorvastatin  40mg  daily   Hypertension:  Current medications - losartan  100mg  daily and amlodipine  2.5mg  daily  Patient checks blood pressure at home - checked 2 days  ago and blood pressure was 120/70  BP Readings from Last 3 Encounters:  01/21/24 132/66  08/29/23 120/68  08/07/23 120/66    Medication Access/Adherence  Current Pharmacy:  [name], [name] Phone: [phone] Fax: [phone]   Patient reports affordability concerns with their medications: Yes  Patient reports access/transportation concerns to their pharmacy: No  Patient reports adherence concerns with their medications:  No      Asthma:  Current medications: Breo once a day (patient taking as needed) and albuterol  as needed.   Patient states he will take Breo during the spring and fall when he has more allergy symptoms but he also mentioned that he feels like the last time that he too [place] it made it more difficult for him to breath.   Reports no exacerbations in the past year  Objective:  Lab Results  Component Value Date   HGBA1C 6.6 12/26/2023    Lab Results  Component Value Date   CREATININE 1.4 (A) 12/26/2023   BUN 19 12/26/2023   NA 138 12/26/2023   K 4.2 12/26/2023   CL 102 12/26/2023   CO2 29 (A) 12/26/2023    Lab Results  Component Value Date   CHOL 110 01/21/2024   HDL 43.90 01/21/2024   LDLCALC 52 01/21/2024   TRIG 70.0 01/21/2024   CHOLHDL 2 01/21/2024    Medications Reviewed Today   Medications were  not reviewed in this encounter       Assessment/Plan:   Diabetes: Currently uncontrolled  per last A1c. Due to recheck soon - Recommend to continue Jardiance  10mg  daily and Ozempic  1mg  weekly.  - Recommended since he was having some symptoms of low blood glucose that he hold glimepiride  again (had been instructed to hold in the past when he started Ozempic )  - Continue to follow up with endocrinology, Dr Braulio for thyroid  nodule and diabetes - Discuss lower carbohydrate / higher protein snack ideas. Recommended he substitute water for juice (or do half  juice and half water).  - Recommend to check glucose 1 to 2 times per day - Meets financial criteria for Ozmepic patient assistance program through [name]. Application approved thry 04/16/2024.   Hyperlipidemia:  LDL and Tg are at goal - continue atorvastatin   Hypertension: office and home blood pressure have been at goal. - continue losartan  and amlodipine   Asthma: - Discussed the different between maintenance and rescue inhalers.  - Encouraged patient to retry taking Breo on a daily basis - 1 puff daily  - Continue to follow up with pulmonology - next appointment is November 2025.   Medication management:  - Reviewed med list and refill history. Needed updated Rx for atorvastatin  - Dicussed that Ozempic  will no longer be available to Medicare recipients in 2026 thru the [name] plan.  Reviewed his 2026 [facility] plan if he keeps the same plan.   - Deductible = $355  - Ozempic  and Jardiance  would be tier 3 with copay of 21% of medication cost. Ozempic  = $210 anJardiance  = $48.   - I would recommend he discuss Medicare options either with insurance agent or [employer]. Patient states he is planning to call his agent for appointment today.      Follow Up Plan: 8-12 weeks.   Madelin Ray, PharmD Clinical Pharmacist [facility]

## 2024-02-25 ENCOUNTER — Ambulatory Visit: Payer: Self-pay

## 2024-02-25 NOTE — Telephone Encounter (Signed)
 FYI Only or Action Required?: FYI only for provider: appointment scheduled on 02/26/2024.  Patient was last seen in primary care on 01/21/2024 by Donald Aloysius BRAVO, MD.  Called Nurse Triage reporting Cough and Sinusitis.  Symptoms began a week ago.  Interventions attempted: OTC medications: tussin DM for diabetics.  Symptoms are: gradually worsening.  Triage Disposition: See PCP When Office is Open (Within 3 Days)  Patient/caregiver understands and will follow disposition?: Yes  Copied from CRM [id number]. Topic: Clinical - Medication Question >> Feb 25, 2024  4:47 PM [name] wrote: Reason for CRM: Patient has cough and body aches - would like a antibiotic sent to pharmacy for sinus infection Reason for Disposition  [1] Sinus congestion (pressure, fullness) AND [2] present > 10 days  Answer Assessment - Initial Assessment Questions 1. LOCATION: Where does it hurt?      Front of head near eyes 2. ONSET: When did the sinus pain start?  (e.g., hours, days)      One week ago 3. SEVERITY: How bad is the pain?   (Scale 0-10; or none, mild, moderate or severe)     5/10 4. RECURRENT SYMPTOM: Have you ever had sinus problems before? If Yes, ask: When was the last time? and What happened that time?      Yes, history of sinus issues 5. NASAL CONGESTION: Is the nose blocked? If Yes, ask: Can you open it or must you breathe through your mouth?     Stopped up at times 6. NASAL DISCHARGE: Do you have discharge from your nose? If so ask, What color?     green 7. FEVER: Do you have a fever? If Yes, ask: What is it, how was it measured, and when did it start?      denies 8. OTHER SYMPTOMS: Do you have any other symptoms? (e.g., sore throat, cough, earache, difficulty breathing)     Post nasal drip/cough. Green sputum. Sometimes wheezing associated with cough  Protocols used: Sinus Pain or Congestion-A-AH

## 2024-02-26 ENCOUNTER — Ambulatory Visit: Admitting: Family Medicine

## 2024-02-26 ENCOUNTER — Encounter: Payer: Self-pay | Admitting: Family Medicine

## 2024-02-26 VITALS — BP 130/68 | HR 78 | Temp 97.9°F | Resp 16 | Ht 65.0 in | Wt 230.4 lb

## 2024-02-26 DIAGNOSIS — J0140 Acute pansinusitis, unspecified: Secondary | ICD-10-CM

## 2024-02-26 LAB — POCT INFLUENZA A/B
Influenza A, POC: NEGATIVE
Influenza B, POC: NEGATIVE

## 2024-02-26 LAB — POC COVID19 BINAXNOW: SARS Coronavirus 2 Ag: NEGATIVE

## 2024-02-26 MED ORDER — AMOXICILLIN-POT CLAVULANATE 875-125 MG PO TABS: 1.0000 | ORAL_TABLET | Freq: Two times a day (BID) | ORAL | 0 refills | Status: AC

## 2024-02-26 NOTE — Telephone Encounter (Signed)
 Appt scheduled

## 2024-02-26 NOTE — Progress Notes (Signed)
 Chief Complaint  Patient presents with   Sinusitis    Sinusitis and Cough    Donald Jones here for URI complaints.  Duration: 2 weeks  Associated symptoms: sinus headache, sinus congestion, sinus pain, rhinorrhea, upper dental pain, and coughing from drainage Denies: itchy watery eyes, ear pain, ear drainage, sore throat, wheezing, shortness of breath, myalgia, and fevers Treatment to date: salt water gargles, Tylenol , robitussin, albuterol  Sick contacts: Yes- sister in law  Past Medical History:  Diagnosis Date   Anxiety    Arthritis    hands right   Diabetes mellitus without complication (HCC)    GERD (gastroesophageal reflux disease)    Hypertension    Kidney stones    several episodes.  Last one in 2011.     Obesity    100-lb weight loss since 2011.    Polycythemia    Sleep apnea    on Cpap    Objective BP 130/68 (BP Location: Left Arm, Patient Position: Sitting)   Pulse 78   Temp 97.9 F (36.6 C) (Oral)   Resp 16   Ht 5' 5 (1.651 m)   Wt 230 lb 6.4 oz (104.5 kg)   SpO2 96%   BMI 38.34 kg/m  General: Awake, alert, appears stated age HEENT: AT, [place], ears patent b/l and TM's neg, nares patent w/o discharge, pharynx pink and without exudates, MMM, +max and frontal sinus ttp Neck: No masses or asymmetry Heart: RRR Lungs: CTAB, no accessory muscle use Psych: Age appropriate judgment and insight, normal mood and affect  Acute pansinusitis, recurrence not specified - Plan: amoxicillin -clavulanate (AUGMENTIN ) 875-125 MG tablet, POCT Influenza A/B, POC COVID-19  Given duration of illness without improvement, will treat with antibiotics.  7 days of Augmentin  as above.  COVID and flu testing negative.  Tylenol  as needed.  Continue to push fluids, practice good hand hygiene, cover mouth when coughing. F/u prn. If starting to experience fevers, shaking, or shortness of breath, seek immediate care. Pt voiced understanding and agreement to the plan.  Mabel Mt  [place], DO 02/26/24 1:35 PM

## 2024-02-26 NOTE — Patient Instructions (Signed)
 Continue to push fluids, practice good hand hygiene, and cover your mouth if you cough. ? ?If you start having fevers, shaking or shortness of breath, seek immediate care. ? ?OK to take Tylenol 1000 mg (2 extra strength tabs) or 975 mg (3 regular strength tabs) every 6 hours as needed. ? ?Let us know if you need anything. ?

## 2024-02-28 ENCOUNTER — Ambulatory Visit: Admitting: Adult Health

## 2024-02-28 ENCOUNTER — Other Ambulatory Visit: Payer: Self-pay | Admitting: Internal Medicine

## 2024-02-28 ENCOUNTER — Encounter: Payer: Self-pay | Admitting: Adult Health

## 2024-03-03 ENCOUNTER — Telehealth: Payer: Self-pay | Admitting: Pharmacy Technician

## 2024-03-03 NOTE — Progress Notes (Cosign Needed Addendum)
 03/03/2024  Patient ID: Donald Jones, male   DOB: Aug 17, 1945, 78 y.o.   MRN: [id number]  Patient engaged with clinical pharmacist for management of diabetes on 02/06/2024. Outreach by [employer] technician was requested.   Outreached patient to discuss diabetes medication management. Left voicemail for patient to return my call at their convenience.  Of note, patient is followed by [facility]. Last appointment was 12/26/2023 with next appointment scheduled for 04/23/24. Last A1C was 6.6% collected on 12/26/2023 Will attempt one more outreach.   ADDENDUM 03/03/2024 1:17PM-Patient returned the call.  Patient is appearing for a follow-up visit with the population health pharmacy technician. Last engaged with the clinical pharmacist to discuss diabetes, hypertension, and high cholesterol on 02/06/2024. Contacted patient today to discuss diabetes.   Plan from last clinical pharmacist appointment:  Diabetes: Currently uncontrolled  per last A1c. Due to recheck soon - Recommend to continue Jardiance  10mg  daily and Ozempic  1mg  weekly.  - Recommended since he was having some symptoms of low blood glucose that he hold glimepiride  again (had been instructed to hold in the past when he started Ozempic )  - Continue to follow up with endocrinology, Dr Braulio for thyroid  nodule and diabetes - Discuss lower carbohydrate / higher protein snack ideas. Recommended he substitute water for juice (or do half juice and half water).  - Recommend to check glucose 1 to 2 times per day - Meets financial criteria for Ozmepic patient assistance program through [name]. Application approved thry 04/16/2024.  Hyperlipidemia:  LDL and Tg are at goal - continue atorvastatin  Hypertension: office and home blood pressure have been at goal. - continue losartan  and amlodipine  Medication management:  - Reviewed med list and refill history. Needed updated Rx for atorvastatin  - Dicussed that Ozempic  will no  longer be available to Medicare recipients in 2026 thru the [name] plan.  Reviewed his 2026 [facility] plan if he keeps the same plan.              - Deductible = $355             - Ozempic  and Jardiance  would be tier 3 with copay of 21% of medication cost. Ozempic  = $210 anJardiance  = $48.              - I would recommend he discuss Medicare options either with insurance agent or [employer]. Patient states he is planning to call his agent for appointment today.  Follow Up Plan: 8-12 weeks(copy/paste from last note)   Medication Adherence Barriers Identified:  Patient made recommended medication changes per plan: Yes Patient informs he takes Ozempic  0.5mg  weekly, Glimepiride  2mg  daily (previously on hold but patient reports taking it today) and Jardiance  10mg  daily. Access issues with any new medication or testing device: No Patient last filled 90 tablets of Jardiance  on 10/20 and 90 tablets of Glimepiride  2mg  on 6/10. He reports receiving Ozempic  0.5mg  from patient assistance. He reports having Ozempic  1mg  on hand that he was unable to take. He is inquiring if he can still use those but inject a 0.5mg  dose from a 1mg  pen. Attempted to reach PharmD while on the phone with the patient but was unable to reach PharmD. Will send message to PharmD with patient;s question. Patient is checking blood sugars as prescribed: Yes Patient informs he typically checks his blood sugar once a day in the morning. He informs it is typically between 156-157 but this morning before breakfast, he reports a  blood sugar of 129. Patient appears adherent to statin and acei/arbs. Patient last filled Atorvastatin  and Losartan   for 100 tabs on 10/20. Medication Adherence Barriers Addressed/Actions Taken:  Reviewed medication changes per plan from last clinical pharmacist note Educated patient to contact pharmacy regarding new prescriptions Reviewed instructions for monitoring blood sugars at home and reminded patient  to keep a written log to review with pharmacist Reminded patient of date/time of upcoming clinical pharmacist follow up and any upcoming PCP/specialists visits. Patient denies transportation barriers to the appointment. Yes  Next clinical pharmacist appointment is scheduled for: 05/07/2024   Jeneva Schweizer, CPhT [facility] Office: [phone] Email: [email or web address]

## 2024-03-04 ENCOUNTER — Ambulatory Visit (HOSPITAL_BASED_OUTPATIENT_CLINIC_OR_DEPARTMENT_OTHER)

## 2024-03-04 ENCOUNTER — Encounter (HOSPITAL_BASED_OUTPATIENT_CLINIC_OR_DEPARTMENT_OTHER): Payer: Self-pay

## 2024-03-04 VITALS — BP 132/78 | HR 65 | Temp 98.0°F | Ht 65.0 in | Wt 232.0 lb

## 2024-03-04 DIAGNOSIS — G4733 Obstructive sleep apnea (adult) (pediatric): Secondary | ICD-10-CM

## 2024-03-04 MED ORDER — BREO ELLIPTA 100-25 MCG/ACT IN AEPB
1.0000 | INHALATION_SPRAY | Freq: Every day | RESPIRATORY_TRACT | 1 refills | Status: AC
Start: 2024-03-04 — End: ?

## 2024-03-04 NOTE — Progress Notes (Signed)
 @Patient  ID: Donald Jones, male    DOB: [birth year], 78 y.o.   MRN: [id number]  Chief Complaint  Patient presents with   Sleep Apnea    Referring provider: Amon Aloysius BRAVO, MD  HPI: Discussed the use of AI scribe software for clinical note transcription with the patient, who gave verbal consent to proceed.  History of Present Illness Donald Jones is a 78 year old male with sleep apnea who presents for follow-up of his sleep therapy.  He started using a new CPAP machine since his last visit in April, which he finds significantly quieter and more comfortable than his previous machine of six years. The mask fits well, and he feels rested upon waking, but still admits persistent fatigue throughout the day. He is compliant with wearing the CPAP every night for just under six hours, which he describes as 'pretty good'.  He mentions a recent sinus infection for which he was prescribed antibiotics about eight days ago. There has been significant improvement in symptoms, including mucus drainage, after starting the antibiotics. No chest involvement is noted, and he is feeling much better now.  Review of his compliance download indicates excellent compliance, though he still has residual AHI of 10.9/hr with some central apneas and an acceptable leak profile.  He mentions having a home sleep study due to caregiving responsibilities for his wife, who has Parkinson's disease and recently underwent hip surgery. He expresses concern about his oxygen levels dropping when he relaxes and falls asleep, recalling an incident in the hospital where he was reminded to breathe due to low oxygen levels.  He is not currently taking Breo for asthma but plans to resume it due to recent symptoms of airway constriction and difficulty breathing. He has albuterol  for use as needed.   Last OV 08/07/2023: 78 year old male followed for obstructive sleep apnea (diagnosed in 2003) on nocturnal CPAP Medical history is  significant for hypertension, diabetes and asthma   TEST/EVENTS :    08/07/2023 Follow up : OSA  Patient returns for follow up for sleep apnea. Has been on CPAP for >20 yrs. Feels he benefits from CPAP with decreased daytime sleepiness.  He says he does not sleep without his CPAP.  Last visit CPAP pressure changed to auto CPAP 12 to 16 cm H2O.  Due to residual events.  CPAP download shows no significant improvement with ongoing residual episodes.  Patient says his mask is leaking quite a bit.  His machine is also gotten old greater than 10 years feels it does not work as well as it used to.  Patient is also only gets about 6 hours of sleep because he is the caregiver for his wife who has Parkinson's.  CPAP download shows 100% compliance.  Daily average usage at 6 hours.  Patient is on auto CPAP 12 to 16 cm H2O.  AHI 10.5/hour.  Residual central episodes at 5.1/hour.  Daily average pressure at 13.8 cm H2O.  We discussed an in lab CPAP titration study.  Patient wants to hold off at this time due to his responsibilities at home.  Patient says he has some daytime sleepiness but just feels that he is tired in general.  He is not taking any pain or sedating medications.   TEST/EVENTS : DME [employer]  HST 11/09/2023:  moderate OSA AHI 17/hr with O2 sat nadir 83%  Allergies  Allergen Reactions   Metformin  And Related     Intolerant, GI symptoms   Sulfa Antibiotics Nausea And  Vomiting    Immunization History  Administered Date(s) Administered   Fluad  Quad(high Dose 65+) 01/10/2019, 01/23/2020, 02/03/2022   Fluad  Trivalent(High Dose 65+) 12/27/2022   INFLUENZA, HIGH DOSE SEASONAL PF 01/22/2015, 02/03/2016, 01/29/2017, 01/09/2018, 01/21/2024   Influenza Split 01/17/2011, 01/16/2012   Influenza Whole 02/18/2007   Influenza,inj,Quad PF,6+ Mos 02/05/2013, 01/27/2014, 03/28/2019   Influenza-Unspecified 01/15/2021   PFIZER Comirnaty (Gray Top)Covid-19 Tri-Sucrose Vaccine 09/24/2020   PFIZER(Purple  Top)SARS-COV-2 Vaccination 04/12/2019, 06/03/2019, 04/07/2020   PNEUMOCOCCAL CONJUGATE-20 02/03/2022   Pfizer(Comirnaty )Fall Seasonal Vaccine 12 years and older 06/09/2022, 03/02/2023, 10/04/2023   Pneumococcal Conjugate-13 01/22/2015   Pneumococcal Polysaccharide-23 01/16/2003, 12/03/2012   Respiratory Syncytial Virus Vaccine ,Recomb Aduvanted(Arexvy ) 02/06/2022   Tdap 12/03/2012, 05/22/2016   Zoster Recombinant(Shingrix) 01/29/2018, 05/23/2018   Zoster, Live 02/09/2010    Past Medical History:  Diagnosis Date   Anxiety    Arthritis    hands right   Diabetes mellitus without complication (HCC)    GERD (gastroesophageal reflux disease)    Hypertension    Kidney stones    several episodes.  Last one in 2011.     Obesity    100-lb weight loss since 2011.    Polycythemia    Sleep apnea    on Cpap    Tobacco History: Social History   Tobacco Use  Smoking Status Never  Smokeless Tobacco Never   Counseling given: Not Answered   Outpatient Medications Prior to Visit  Medication Sig Dispense Refill   Accu-Chek FastClix Lancets MISC USE TO CHECK BLOOD GLUCOSE ONCE A DAY 102 each 12   albuterol  (VENTOLIN  HFA) 108 (90 Base) MCG/ACT inhaler Inhale 2 puffs into the lungs every 6 (six) hours as needed for wheezing or shortness of breath. 18 g 5   amLODipine  (NORVASC ) 2.5 MG tablet Take 1 tablet (2.5 mg total) by mouth daily. 100 tablet 1   amoxicillin -clavulanate (AUGMENTIN ) 875-125 MG tablet Take 1 tablet by mouth 2 (two) times daily for 7 days. 14 tablet 0   aspirin  EC 81 MG tablet Take 81 mg by mouth daily. Each morning. Swallow whole.     atorvastatin  (LIPITOR) 40 MG tablet Take 1 tablet (40 mg total) by mouth at bedtime. 100 tablet 1   dicyclomine  (BENTYL ) 20 MG tablet Take 1 tablet (20 mg total) by mouth 3 (three) times daily before meals. 270 tablet 0   empagliflozin  (JARDIANCE ) 10 MG TABS tablet Take 1 tablet (10 mg total) by mouth daily before breakfast. 90 tablet 1    escitalopram  (LEXAPRO ) 10 MG tablet Take 1 tablet (10 mg total) by mouth daily. 90 tablet 1   famotidine  (PEPCID ) 20 MG tablet Take 1 tablet (20 mg total) by mouth 2 (two) times daily. 180 tablet 1   Flaxseed, Linseed, (FLAXSEED OIL) 1000 MG CAPS Take 2,000 mg by mouth daily.      fluticasone  (FLONASE ) 50 MCG/ACT nasal spray PLACE 1 SPRAY INTO BOTH NOSTRILS DAILY. 16 g 12   furosemide  (LASIX ) 40 MG tablet Take 1 tablet (40 mg total) by mouth daily. 90 tablet 1   glimepiride  (AMARYL ) 2 MG tablet Take 1 tablet (2 mg total) by mouth daily with breakfast. 90 tablet 0   glucose blood (ACCU-CHEK GUIDE TEST) test strip USE TO CHECK BLOOD GLUCOSE ONCE A DAY 100 strip 12   losartan  (COZAAR ) 100 MG tablet Take 1 tablet (100 mg total) by mouth daily. 100 tablet 3   meclizine  (ANTIVERT ) 12.5 MG tablet TAKE 1 TABLET BY MOUTH 3 TIMES DAILY AS NEEDED FOR DIZZINESS. 30 tablet  0   omeprazole  (PRILOSEC ) 20 MG capsule Take 1 capsule (20 mg total) by mouth daily. 90 capsule 1   Probiotic Product (FLORAJEN DIGESTION PO) Take 1 capsule by mouth daily.     psyllium (METAMUCIL) 58.6 % powder Take 1 packet by mouth daily. (Patient taking differently: Take 1 packet by mouth as needed.)     BREO ELLIPTA  100-25 MCG/ACT AEPB Inhale 1 puff into the lungs daily. (Patient taking differently: Inhale 1 puff into the lungs daily. Only PRN) 60 each 2   Semaglutide , 1 MG/DOSE, (OZEMPIC , 1 MG/DOSE,) 2 MG/1.5ML SOPN Inject 1 mg into the skin once a week. (Patient not taking: Reported on 02/06/2024)     No facility-administered medications prior to visit.     Review of Systems: as per HPI  Constitutional:   No  weight loss, night sweats,  Fevers, chills, fatigue, or  lassitude.  HEENT:   No headaches,  Difficulty swallowing,  Tooth/dental problems, or  Sore throat,                No sneezing, itching, ear ache, nasal congestion, post nasal drip,   CV:  No chest pain,  Orthopnea, PND, swelling in lower extremities, anasarca,  dizziness, palpitations, syncope.   GI  No heartburn, indigestion, abdominal pain, nausea, vomiting, diarrhea, change in bowel habits, loss of appetite, bloody stools.   Resp: No shortness of breath with exertion or at rest.  No excess mucus, no productive cough,  No non-productive cough,  No coughing up of blood.  No change in color of mucus.  No wheezing.  No chest wall deformity  Skin: no rash or lesions.  GU: no dysuria, change in color of urine, no urgency or frequency.  No flank pain, no hematuria   MS:  No joint pain or swelling.  No decreased range of motion.  No back pain.    Physical Exam  BP 132/78   Pulse 65   Temp 98 F (36.7 C)   Ht 5' 5 (1.651 m)   Wt 232 lb (105.2 kg)   SpO2 93%   BMI 38.61 kg/m   GEN: A/Ox3; pleasant , NAD, well nourished    HEENT:  [place]/AT,  EACs-clear, TMs-wnl, NOSE-clear, THROAT-clear, no lesions, no postnasal drip or exudate noted. Mallampati 4  NECK:  Supple w/ fair ROM; no JVD; normal carotid impulses w/o bruits; no thyromegaly or nodules palpated; no lymphadenopathy.    RESP  Clear  P & A; w/o, wheezes/ rales/ or rhonchi. no accessory muscle use, no dullness to percussion  CARD:  RRR, no m/r/g, no peripheral edema, pulses intact, no cyanosis or clubbing.  GI:   obese, soft & nt; nml bowel sounds; no organomegaly or masses detected.   Musco: Warm bil, no deformities or joint swelling noted.   Neuro: alert, no focal deficits noted.    Skin: Warm, no lesions or rashes    Lab Results:  CBC    Component Value Date/Time   WBC 7.1 12/26/2023 0000   WBC 6.0 09/12/2022 1114   RBC 5.22 (A) 12/26/2023 0000   HGB 16.6 12/26/2023 0000   HGB 12.3 (L) 10/27/2014 1125   HGB 17.3 (H) 09/01/2013 1429   HCT 48 12/26/2023 0000   HCT 38.9 10/27/2014 1125   HCT 49.0 09/01/2013 1429   PLT 192 12/26/2023 0000   PLT 256 10/27/2014 1125   PLT 204 09/01/2013 1429   MCV 96.3 09/12/2022 1114   MCV 76 (L) 10/27/2014 1125   MCV  89.7 09/01/2013  1429   MCH 30.6 08/06/2020 1406   MCHC 32.7 09/12/2022 1114   RDW 14.1 09/12/2022 1114   RDW 16.4 (H) 10/27/2014 1125   RDW 13.0 09/01/2013 1429   LYMPHSABS 1.5 09/12/2022 1114   LYMPHSABS 2.8 10/27/2014 1125   LYMPHSABS 2.1 09/01/2013 1429   MONOABS 0.5 09/12/2022 1114   MONOABS 0.6 09/01/2013 1429   EOSABS 0.2 09/12/2022 1114   EOSABS 0.3 10/27/2014 1125   BASOSABS 0.0 09/12/2022 1114   BASOSABS 0.0 10/27/2014 1125   BASOSABS 0.0 09/01/2013 1429    BMET    Component Value Date/Time   NA 138 12/26/2023 0000   K 4.2 12/26/2023 0000   CL 102 12/26/2023 0000   CO2 29 (A) 12/26/2023 0000   GLUCOSE 158 (H) 02/02/2023 0909   BUN 19 12/26/2023 0000   CREATININE 1.4 (A) 12/26/2023 0000   CREATININE 1.32 02/02/2023 0909   CREATININE 1.24 (H) 08/06/2020 1406   CALCIUM  9.9 12/26/2023 0000   GFRNONAA 52 (L) 08/29/2019 1511   GFRAA 60 08/29/2019 1511    BNP No results found for: BNP  ProBNP    Component Value Date/Time   PROBNP 26.0 11/25/2007 2054    Imaging: No results found.  Administration History     None           No data to display          No results found for: NITRICOXIDE   Assessment & Plan:   Assessment & Plan OSA (obstructive sleep apnea)  Assessment and Plan Assessment & Plan Obstructive sleep apnea with incomplete control on CPAP Continues with residual events >10/hour despite adjustments to settings and new machine. Possible central events; In-lab sleep study needed for further evaluation; may need BiPAP for better control. - Continue CPAP nightly with goal of at least 4-6 hours or more. - Scheduled in-lab sleep study for titration.  Patient feels that he would be able to get coverage for caretaking of his wife to accommodate.  Asthma Uses on a prn basis; but now symptoms are increased with the seasonal changes - Resume daily Breo. - Use albuterol  as needed for symptoms.    Return in about 10 weeks (around 05/13/2024) for sleep  study review.  Candis Dandy, PA-C 03/04/2024

## 2024-03-04 NOTE — Patient Instructions (Addendum)
 Continue CPAP nightly with goal of 4-6 hours or more of usage.  Resume taking Breo daily.  Use Albuterol  every 4-6 hours as needed for shortness of breath.  Complete titration study as ordered; follow up after completion to discuss results.

## 2024-03-06 ENCOUNTER — Telehealth: Payer: Self-pay | Admitting: Pharmacist

## 2024-03-06 NOTE — Telephone Encounter (Signed)
 Tried to contact patient regarding Ozempic  1mg  pen adjustment to get 0.5mg  dose.   Sent patient message in MyChart with dosing recommendation below.   Ozempic  4 mg/2 mL - Teal Color Pen Dose  # Clicks  0.25 mg 19 clicks  0.5 mg 37 clicks  1 mg 74 clicks

## 2024-03-06 NOTE — Telephone Encounter (Signed)
-----   Message from Kate PARAS Simcox sent at 03/03/2024  2:02 PM EST ----- Patient has question about Ozempic  dosing. He is currently on Ozempic  0.5mg . He informs he has several boxes of Ozempic  1mg  and is inquiring if he can get a 0.5mg  dose from the 1mg  pen. Please advise. Thanks.

## 2024-03-12 ENCOUNTER — Other Ambulatory Visit (INDEPENDENT_AMBULATORY_CARE_PROVIDER_SITE_OTHER): Admitting: Pharmacist

## 2024-03-12 DIAGNOSIS — E11620 Type 2 diabetes mellitus with diabetic dermatitis: Secondary | ICD-10-CM

## 2024-03-12 DIAGNOSIS — I10 Essential (primary) hypertension: Secondary | ICD-10-CM

## 2024-03-12 DIAGNOSIS — E7800 Pure hypercholesterolemia, unspecified: Secondary | ICD-10-CM

## 2024-03-12 NOTE — Progress Notes (Signed)
 03/12/2024 Name: Donald Jones MRN: [id number] DOB: [birth year]  Chief Complaint  Patient presents with   Diabetes   Medication Management    Donald Jones is a 78 y.o. year old male who presented for a phone visit   They were referred to the pharmacist by their PCP for assistance in managing diabetes and medication access.    Subjective:  Medicaton Access: He is currently receiving Ozempic  from [name] thru 04/16/2024. His last delivery was the 1mg  dose though and dose has been lowered down to 0.5mg   He is also using Medicare Payment Plan - he pays thru [facility] per month - about $37 to 150. He pays $0 for medications at the pharmacy.  We have assessed in past for Jardiance  but he was above income cut off.   Diabetes: Managed by endocrinologist - Dr Braulio - last appointment was 12/2023  Current medications:  Jardiance  10mg  daily Ozempic  0.5mg  weekly. He tried higher dose 1mg  weekly but had some side effects so Dr Braulio lowered dose back to 0.5mg  weekly  Glimepiride  2mg  - once a day with breakfast. Restarted when he lowered dose of Ozempic   Recent home blood glucose readings - highest - 190 / lowest - 105   Current medication access support: Approved to receive Ozempic  in 2025 from [name]. Expires 04/16/2024. Patient will not be eligible for program in 2026 due to program changes.    He is also seeing Dr Braulio for thyroid  nodule. Biopsy completed 03/2023. FINAL MICROSCOPIC DIAGNOSIS: Benign follicular nodule ([name]   Weight before starting Ozempic  = 240 lbs BMI prior to starting Ozempic  = 40 Has lost about 8lbs total  Wt Readings from Last 3 Encounters:  03/04/24 232 lb (105.2 kg)  02/26/24 230 lb 6.4 oz (104.5 kg)  01/21/24 233 lb (105.7 kg)    Macrovascular and Microvascular Risk Reduction:  Statin? yes (atorvastatin ); ACEi/ARB? yes (losartan  and also taking SGLT2 - Jardiance ) Last urinary albumin/creatinine ratio checked at  same time so unable to calculate UACR.  Lab Results  Component Value Date   MICRALBCREAT Unable to calculate 01/21/2024   MICRALBCREAT 2.8 01/22/2015   Last eye exam:  Lab Results  Component Value Date   HMDIABEYEEXA No Retinopathy 02/22/2023   Last foot exam: 10/18/2022 Tobacco Use:  Tobacco Use: Low Risk  (03/04/2024)   Patient History    Smoking Tobacco Use: Never    Smokeless Tobacco Use: Never    Passive Exposure: Not on file     Hyperlipidemia:  Taking atorvastatin  40mg  daily   Hypertension:  Current medications - losartan  100mg  daily and amlodipine  2.5mg  daily  Patient checks blood pressure at home - checked 2 days ago and blood pressure was 120/70  BP Readings from Last 3 Encounters:  03/04/24 132/78  02/26/24 130/68  01/21/24 132/66    Medication Access/Adherence  Current Pharmacy:  [facility] - [place], [place] - [address] [place] [address] Phone: [phone] Fax: [phone]   Patient reports affordability concerns with their medications: Yes  Patient reports access/transportation concerns to their pharmacy: No  Patient reports adherence concerns with their medications:  No      Objective:  Lab Results  Component Value Date   HGBA1C 6.6 12/26/2023    Lab Results  Component Value Date   CREATININE 1.4 (A) 12/26/2023   BUN 19 12/26/2023   NA 138 12/26/2023   K 4.2 12/26/2023   CL 102 12/26/2023  CO2 29 (A) 12/26/2023    Lab Results  Component Value Date   CHOL 110 01/21/2024   HDL 43.90 01/21/2024   LDLCALC 52 01/21/2024   TRIG 70.0 01/21/2024   CHOLHDL 2 01/21/2024    Medications Reviewed Today     Reviewed by Carla Milling, RPH-CPP (Pharmacist) on 03/12/24 at 1148  Med List Status: <None>   Medication Order Taking? Sig Documenting Provider Last Dose Status Informant  Accu-Chek FastClix Lancets MISC [id number] Yes USE TO CHECK BLOOD GLUCOSE ONCE A DAY Paz, Jose E, MD  Active    albuterol  (VENTOLIN  HFA) 108 (90 Base) MCG/ACT inhaler [id number] Yes Inhale 2 puffs into the lungs every 6 (six) hours as needed for wheezing or shortness of breath. Paz, Jose E, MD  Active   amLODipine  (NORVASC ) 2.5 MG tablet [id number] Yes Take 1 tablet (2.5 mg total) by mouth daily. Amon Aloysius BRAVO, MD  Active   aspirin  EC 81 MG tablet [id number] Yes Take 81 mg by mouth daily. Each morning. Swallow whole. [provider]  Active   atorvastatin  (LIPITOR) 40 MG tablet [id number] Yes Take 1 tablet (40 mg total) by mouth at bedtime. Amon Aloysius BRAVO, MD  Active   BREO ELLIPTA  100-25 MCG/ACT AEPB [id number] Yes Inhale 1 puff into the lungs daily. Only PRN Gillis, Joan, PA-C  Active   dicyclomine  (BENTYL ) 20 MG tablet [id number]  Take 1 tablet (20 mg total) by mouth 3 (three) times daily before meals. Paz, Jose E, MD  Active   empagliflozin  (JARDIANCE ) 10 MG TABS tablet [id number] Yes Take 1 tablet (10 mg total) by mouth daily before breakfast. Paz, Jose E, MD  Active   escitalopram  (LEXAPRO ) 10 MG tablet [id number] Yes Take 1 tablet (10 mg total) by mouth daily. Paz, Jose E, MD  Active   famotidine  (PEPCID ) 20 MG tablet [id number] Yes Take 1 tablet (20 mg total) by mouth 2 (two) times daily. Paz, Jose E, MD  Active   Flaxseed, Linseed, (FLAXSEED OIL) 1000 MG CAPS [id number]  Take 2,000 mg by mouth daily.  [provider]  Active Self  fluticasone  (FLONASE ) 50 MCG/ACT nasal spray [id number]  PLACE 1 SPRAY INTO BOTH NOSTRILS DAILY. Paz, Jose E, MD  Active   furosemide  (LASIX ) 40 MG tablet [id number] Yes Take 1 tablet (40 mg total) by mouth daily. Paz, Jose E, MD  Active   glimepiride  (AMARYL ) 2 MG tablet [id number] Yes Take 1 tablet (2 mg total) by mouth daily with breakfast. Amon Aloysius BRAVO, MD  Active   glucose blood (ACCU-CHEK GUIDE TEST) test strip [id number] Yes USE TO CHECK BLOOD GLUCOSE ONCE A DAY Paz, Jose E, MD  Active   losartan  (COZAAR ) 100 MG tablet [id number] Yes Take 1 tablet (100 mg total) by mouth  daily. Paz, Jose E, MD  Active   meclizine  (ANTIVERT ) 12.5 MG tablet [id number]  TAKE 1 TABLET BY MOUTH 3 TIMES DAILY AS NEEDED FOR DIZZINESS. Saguier, [place], PA-C  Active   omeprazole  (PRILOSEC ) 20 MG capsule [id number] Yes Take 1 capsule (20 mg total) by mouth daily. Paz, Jose E, MD  Active   Probiotic Product [facility] DIGESTION PO) [id number]  Take 1 capsule by mouth daily. [provider]  Active   psyllium (METAMUCIL) 58.6 % powder [id number]  Take 1 packet by mouth daily.  Patient taking differently: Take 1 packet by mouth as needed.   [provider]  Active    Patient not taking:   Discontinued 03/12/24 1121 (Dose change)  Semaglutide ,0.25 or 0.5MG /DOS, (OZEMPIC , 0.25 OR 0.5 MG/DOSE,) 2 MG/3ML SOPN [id number] Yes Inject 0.5 mg into the skin once a week. [provider]  Active               Assessment/Plan:   Diabetes: Currently controlled  per last A1c. - Recommend to continue Jardiance  10mg  daily and Ozempic  0.5mg  weekly. Education provided about how to use clicks to use Ozempic  1mg  pens for 0.5mg  dose. Ozempice 0.5mg  = 37 clicks (will do this until he uses the 1mg  pens on hand and them start back on the 0.25/0.5mg  Ozempic  pens.  - Continue to follow up with endocrinology, Dr Braulio for thyroid  nodule and diabetes - he is to call her office to see when his next appointment is scheduled - per lst office visit in September 2025 she recommended follow up in 4 months.   - Discuss lower carbohydrate / higher protein snack ideas. Recommended he substitute water for juice (or do half juice and half water).  - Recommend to check glucose 1 to 2 times per day - Recommend rechecking UACR / microalbumin + serum creatine / BMET in February when patient has CPE with PCP. Placed note with his appointment notes.   Hyperlipidemia:  LDL and Tg are at goal - continue atorvastatin   Hypertension: office and home blood pressure have been at goal. - continue losartan  and  amlodipine   Medication management:  - Reviewed med list and refill history.  - At paitent request again reviewed his 2026 [facility]  - Deductible = $355  - Ozempic  and Jardiance  would be tier 3 with copay of 21% of medication cost. Ozempic  = $210 anJardiance  = $48.   - Patient is planning to use the pharmacy payment plan again in 2026 which should make monthly payment around $150 - $175 / month     Follow Up Plan: 8 weeks.   Madelin Ray, PharmD Clinical Pharmacist [facility]

## 2024-03-18 ENCOUNTER — Other Ambulatory Visit: Payer: Self-pay | Admitting: Internal Medicine

## 2024-03-18 MED ORDER — ACCU-CHEK GUIDE TEST VI STRP: ORAL_STRIP | 12 refills | Status: AC

## 2024-03-18 MED ORDER — ACCU-CHEK FASTCLIX LANCETS MISC: 12 refills | Status: AC

## 2024-03-18 NOTE — Telephone Encounter (Signed)
 Copied from CRM #[id number]. Topic: Clinical - Medication Refill >> Mar 18, 2024 12:27 PM Ahlexyia S wrote: Medication:  glucose blood (ACCU-CHEK GUIDE TEST) test strip Accu-Chek FastClix Lancets MISC  Has the patient contacted their pharmacy? No, pt wasn't sure who to call first. (Agent: If no, request that the patient contact the pharmacy for the refill. If patient does not wish to contact the pharmacy document the reason why and proceed with request.) (Agent: If yes, when and what did the pharmacy advise?)  This is the patient's preferred pharmacy:  [facility] - [place], [place] - [address] [place] [address] Phone: [phone] Fax: [phone]  Is this the correct pharmacy for this prescription? Yes If no, delete pharmacy and type the correct one.   Has the prescription been filled recently? Yes, pt stated he lost the kit so he is needing it again.  Is the patient out of the medication? Yes  Has the patient been seen for an appointment in the last year OR does the patient have an upcoming appointment? Yes  Can we respond through MyChart? Yes  Agent: Please be advised that Rx refills may take up to 3 business days. We ask that you follow-up with your pharmacy.

## 2024-04-09 ENCOUNTER — Other Ambulatory Visit: Payer: Self-pay | Admitting: Internal Medicine

## 2024-04-23 ENCOUNTER — Ambulatory Visit: Admitting: Podiatry

## 2024-04-23 LAB — BASIC METABOLIC PANEL WITH GFR
BUN: 20 (ref 4–21)
CO2: 29 — AB (ref 13–22)
Chloride: 100 (ref 99–108)
Creatinine: 1.4 — AB (ref 0.6–1.3)
Glucose: 221
Potassium: 4.4 meq/L (ref 3.5–5.1)
Sodium: 136 — AB (ref 137–147)

## 2024-04-23 LAB — CBC AND DIFFERENTIAL
HCT: 50 (ref 41–53)
Hemoglobin: 16.3 (ref 13.5–17.5)
Platelets: 188 K/uL (ref 150–400)
WBC: 6.8

## 2024-04-23 LAB — HEPATIC FUNCTION PANEL
ALT: 55 U/L — AB (ref 10–40)
AST: 31 (ref 14–40)
Alkaline Phosphatase: 116 (ref 25–125)
Bilirubin, Total: 1.7

## 2024-04-23 LAB — HEMOGLOBIN A1C: Hemoglobin A1C: 8.1

## 2024-04-23 LAB — TSH: TSH: 0.45 (ref 0.41–5.90)

## 2024-04-23 LAB — COMPREHENSIVE METABOLIC PANEL WITH GFR
Albumin: 4.4 (ref 3.5–5.0)
Calcium: 10 (ref 8.7–10.7)
eGFR: 50

## 2024-04-23 LAB — CBC: RBC: 5.47 — AB (ref 3.87–5.11)

## 2024-04-24 ENCOUNTER — Encounter: Payer: Self-pay | Admitting: Internal Medicine

## 2024-04-28 ENCOUNTER — Encounter: Payer: Self-pay | Admitting: Podiatry

## 2024-04-28 ENCOUNTER — Ambulatory Visit (INDEPENDENT_AMBULATORY_CARE_PROVIDER_SITE_OTHER): Admitting: Podiatry

## 2024-04-28 DIAGNOSIS — E11620 Type 2 diabetes mellitus with diabetic dermatitis: Secondary | ICD-10-CM | POA: Diagnosis not present

## 2024-04-28 DIAGNOSIS — I872 Venous insufficiency (chronic) (peripheral): Secondary | ICD-10-CM | POA: Diagnosis not present

## 2024-04-28 DIAGNOSIS — M79675 Pain in left toe(s): Secondary | ICD-10-CM | POA: Diagnosis not present

## 2024-04-28 DIAGNOSIS — B351 Tinea unguium: Secondary | ICD-10-CM

## 2024-04-28 DIAGNOSIS — M79674 Pain in right toe(s): Secondary | ICD-10-CM

## 2024-04-28 NOTE — Progress Notes (Signed)
 This patient presents to the office with chief complaint of long thick painful nails.  Patient says the nails are painful walking and wearing shoes.  This patient is unable to self treat.  This patient is unable to trim his nails since he is unable to reach his nails.  He is diagnosed with venous stasis.  he presents to the office for preventative foot care services.  General Appearance  Alert, conversant and in no acute stress.  Vascular  Dorsalis pedis and posterior tibial  pulses are absent  bilaterally.  Capillary return is within normal limits  bilaterally. Temperature is within normal limits  bilaterally.  Neurologic  Senn-Weinstein monofilament wire test within normal limits  bilaterally. Muscle power within normal limits bilaterally.  Nails Thick disfigured discolored nails with subungual debris  from hallux to fifth toes bilaterally. No evidence of bacterial infection or drainage bilaterally.  Orthopedic  No limitations of motion  feet .  No crepitus or effusions noted.  No bony pathology or digital deformities noted.  Skin  normotropic skin with no porokeratosis noted bilaterally.  No signs of infections or ulcers noted.     Onychomycosis  Nails  B/L.  Pain in right toes  Pain in left toes  Debridement of nails both feet followed trimming the nails with dremel tool.    RTC 3 months.   Cordella Bold DPM  lazarus

## 2024-05-05 ENCOUNTER — Ambulatory Visit (HOSPITAL_BASED_OUTPATIENT_CLINIC_OR_DEPARTMENT_OTHER): Admitting: Pulmonary Disease

## 2024-05-05 DIAGNOSIS — G4731 Primary central sleep apnea: Secondary | ICD-10-CM | POA: Diagnosis not present

## 2024-05-05 DIAGNOSIS — G4733 Obstructive sleep apnea (adult) (pediatric): Secondary | ICD-10-CM | POA: Insufficient documentation

## 2024-05-07 ENCOUNTER — Other Ambulatory Visit: Admitting: Pharmacist

## 2024-05-07 ENCOUNTER — Other Ambulatory Visit: Payer: Self-pay | Admitting: Internal Medicine

## 2024-05-07 MED ORDER — AMLODIPINE BESYLATE 2.5 MG PO TABS: 2.5000 mg | ORAL_TABLET | Freq: Every day | ORAL | 0 refills | Status: DC

## 2024-05-07 NOTE — Progress Notes (Signed)
 "  05/07/2024 Name: Donald Jones MRN: [id number] DOB: 28-Sep-1945  Chief Complaint  Patient presents with   Diabetes   Medication Adherence    Donald Jones is a 79 y.o. year old male who presented for a phone visit   They were referred to the pharmacist by their PCP for assistance in managing diabetes and medication access.    Subjective:  Patient has question about sleep study. 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 Results are not available yet. Recommended he discuss results as planned with pulmonology office.   Medicaton Access: He is also using Medicare Payment Plan - he pays thru [facility] per month - about $37 to $150. He pays $0 for medications at the pharmacy.  We have assessed in past for Jardiance  but he was above income cut off.   Diabetes: Managed by endocrinologist - Dr Braulio - last appointment was 04/24/2024  Current medications:  Jardiance  10mg  daily Ozempic  1mg  weekly - he will start today. Dr Braulio increased dose from 0.5mg  to 1mg  at last appointment 04/24/2024  Recent home blood glucose readings - highest - 185 / lowest - 105; per patient blood glucose usually in 140's    He is also seeing Dr Braulio for thyroid  nodule. Biopsy completed 03/2023. FINAL MICROSCOPIC DIAGNOSIS: Benign follicular nodule ([name]   Weight before starting Ozempic  = 240 lbs BMI prior to starting Ozempic  = 40 Has lost about 20 lbs total  Wt Readings from Last 3 Encounters:  05/05/24 220 lb (99.8 kg)  03/04/24 232 lb (105.2 kg)  02/26/24 230 lb 6.4 oz (104.5 kg)    Macrovascular and Microvascular Risk Reduction:  Statin? yes (atorvastatin ); ACEi/ARB? yes (losartan  and also taking SGLT2 - Jardiance ) Last urinary albumin/creatinine ratio checked at same time so unable to calculate UACR.  Lab Results  Component Value Date   MICRALBCREAT Unable to calculate 01/21/2024   MICRALBCREAT  2.8 01/22/2015   Last eye exam:  Lab Results  Component Value Date   HMDIABEYEEXA No Retinopathy 02/22/2023   Last foot exam: 10/18/2022 Tobacco Use:  Tobacco Use: Low Risk (04/28/2024)   Patient History    Smoking Tobacco Use: Never    Smokeless Tobacco Use: Never    Passive Exposure: Not on file     Hyperlipidemia:  Taking atorvastatin  40mg  daily   Hypertension:  Current medications - losartan  100mg  daily and amlodipine  2.5mg  daily  Patient checks blood pressure at home - about 2 times per week. Last blood pressure was 126/67 and pulse 69  BP Readings from Last 3 Encounters:  03/04/24 132/78  02/26/24 130/68  01/21/24 132/66    Medication Access/Adherence  Current Pharmacy:  [facility] - [place], [place] - [address] Donald Jones [place] [address] Phone: [phone] Fax: [phone]   Patient reports affordability concerns with their medications: Yes  Patient reports access/transportation concerns to their pharmacy: No  Patient reports adherence concerns with their medications:  No      Objective:  Lab Results  Component Value Date   HGBA1C 8.1 04/23/2024    Lab Results  Component Value Date   CREATININE 1.4 (A) 04/23/2024   BUN 20 04/23/2024   NA 136 (A) 04/23/2024   K 4.4 04/23/2024   CL 100 04/23/2024   CO2 29 (A) 04/23/2024    Lab Results  Component Value Date   CHOL 110 01/21/2024  HDL 43.90 01/21/2024   LDLCALC 52 01/21/2024   TRIG 70.0 01/21/2024   CHOLHDL 2 01/21/2024    Medications Reviewed Today     Reviewed by Carla Milling, RPH-CPP (Pharmacist) on 05/07/24 at 1117  Med List Status: <None>   Medication Order Taking? Sig Documenting Provider Last Dose Status Informant  Accu-Chek FastClix Lancets MISC [id number]  USE TO CHECK BLOOD GLUCOSE ONCE A DAY Paz, Jose E, MD  Active   albuterol  (VENTOLIN  HFA) [phone] Base) MCG/ACT inhaler [id number] Yes Inhale 2 puffs into the lungs every 6 (six) hours as  needed for wheezing or shortness of breath. Paz, Jose E, MD  Active   amLODipine  (NORVASC ) 2.5 MG tablet [id number] Yes Take 1 tablet (2.5 mg total) by mouth daily. Amon Aloysius BRAVO, MD  Active   aspirin  EC 81 MG tablet [id number] Yes Take 81 mg by mouth daily. Each morning. Swallow whole. [provider]  Active   atorvastatin  (LIPITOR) 40 MG tablet [id number] Yes Take 1 tablet (40 mg total) by mouth at bedtime. Amon Aloysius BRAVO, MD  Active   BREO ELLIPTA  100-25 MCG/ACT AEPB [id number] Yes Inhale 1 puff into the lungs daily. Only PRN Gillis, Joan, PA-C  Active   dicyclomine  (BENTYL ) 20 MG tablet [id number] Yes Take 1 tablet (20 mg total) by mouth 3 (three) times daily before meals. Paz, Jose E, MD  Active   empagliflozin  (JARDIANCE ) 10 MG TABS tablet [id number] Yes Take 1 tablet (10 mg total) by mouth daily before breakfast. Paz, Jose E, MD  Active   escitalopram  (LEXAPRO ) 10 MG tablet [id number] Yes Take 1 tablet (10 mg total) by mouth daily. Paz, Jose E, MD  Active   famotidine  (PEPCID ) 20 MG tablet [id number] Yes Take 1 tablet (20 mg total) by mouth 2 (two) times daily. Paz, Jose E, MD  Active   Flaxseed, Linseed, (FLAXSEED OIL) 1000 MG CAPS [id number] Yes Take 2,000 mg by mouth daily.  [provider]  Active Self  fluticasone  (FLONASE ) 50 MCG/ACT nasal spray [id number] Yes PLACE 1 SPRAY INTO BOTH NOSTRILS DAILY. Paz, Jose E, MD  Active   furosemide  (LASIX ) 40 MG tablet [id number] Yes Take 1 tablet (40 mg total) by mouth daily. Amon Aloysius BRAVO, MD  Active    Patient not taking:   Discontinued 05/07/24 1108   glucose blood (ACCU-CHEK GUIDE TEST) test strip [id number] Yes Check blood sugars once daily Paz, Jose E, MD  Active   losartan  (COZAAR ) 100 MG tablet [id number] Yes Take 1 tablet (100 mg total) by mouth daily. Paz, Jose E, MD  Active   meclizine  (ANTIVERT ) 12.5 MG tablet [id number]  TAKE 1 TABLET BY MOUTH 3 TIMES DAILY AS NEEDED FOR DIZZINESS. Saguier, [place], PA-C  Active   Misc Natural Products  (BEET ROOT PO) [id number] Yes Take 1 each by mouth daily. [provider]  Active   omeprazole  (PRILOSEC ) 20 MG capsule [id number] Yes Take 1 capsule (20 mg total) by mouth daily. Paz, Jose E, MD  Active   Probiotic Product [facility] DIGESTION PO) [id number] Yes Take 1 capsule by mouth daily. [provider]  Active   psyllium (METAMUCIL) 58.6 % powder [id number]  Take 1 packet by mouth daily.  Patient taking differently: Take 1 packet by mouth as needed.   [provider]  Active   Semaglutide , 1 MG/DOSE, (OZEMPIC , 1 MG/DOSE,) 4 MG/3ML SOPN [id number] Yes Inject 1 mg into the skin once a week. [provider]  Active     Discontinued  05/07/24 1101 (Dose change)               Assessment/Plan:   Diabetes: Currently controlled  per last A1c. - Recommend to continue Jardiance  10mg  daily - Recommended he start Ozempic  1mg  with next dose (Dr Doehrr increases at last appointment 04/24/2024).  - Continue to follow up with endocrinology, Dr Braulio for thyroid  nodule and diabetes.   - Discuss lower carbohydrate / higher protein snack ideas. Recommended he substitute water for juice (or do half juice and half water).  - Recommend to check glucose 1 to 2 times per day - Recommend rechecking UACR / microalbumin + serum creatine / BMET in February when patient has CPE with PCP. Placed note with his appointment notes.  - Reminded to get annual eye exam - patient states he has appointment in February 2026.   Hyperlipidemia:  LDL and Tg are at goal - continue atorvastatin   Hypertension: office and home blood pressure have been at goal. - continue losartan  and amlodipine   Medication management:  - Reviewed med list and refill history.  - At paitent request again reviewed his 2026 [facility]  - Deductible = $355  - Ozempic  and Jardiance  would be tier 3 with copay of 21% of medication cost. Ozempic  = $210 anJardiance  = $48.   - Patient is planning to use  the pharmacy payment plan again in 2026 which should make monthly payment around $150 - $175 / month - Coordinated with pharmacy to refill needed medications - amlodipine , atorvastatin , Jardiance , losartan  and Breo.   Meds ordered this encounter  Medications   amLODipine  (NORVASC ) 2.5 MG tablet    Sig: Take 1 tablet (2.5 mg total) by mouth daily.    Dispense:  100 tablet    Refill:  0   Appt with PCP 05/2024  Follow Up Plan: 8 weeks.   Madelin Ray, PharmD Clinical Pharmacist [facility]      "

## 2024-05-12 NOTE — Procedures (Signed)
 [facility] [address] [place], [place] [address] Tel: [phone]   Fax: [phone]  Titration Interpretation  Patient Name:  Donald Jones, Donald Jones Date:  05/05/2024 Referring Physician:  JOAN GILLIS [phone]) %%startinterp%% Indications for Polysomnography The patient is a 79 year old Male who is 5' 5 and weighs 220.0 lbs. His BMI equals 36.7.  A full night titration treatment study was performed. High residual AHI on CPAP downloads HST 11/09/2023: moderate OSA AHI 17/hr with O2 sat nadir 83%     Polysomnogram Data A full night polysomnogram recorded the standard physiologic parameters including EEG, EOG, EMG, EKG, nasal and oral airflow.  Respiratory parameters of chest and abdominal movements were recorded with Respiratory Inductance Plethysmography belts.  Oxygen saturation was recorded by pulse oximetry.   Sleep Architecture The total recording time of the polysomnogram was 427.9 minutes.  The total sleep time was 100.0 minutes.  The patient spent 6.5% of total sleep time in Stage N1, 93.5% in Stage N2, 0.0% in Stages N3, and 0.0% in REM.  Sleep latency was 129.8 minutes.  REM latency was - minutes.  Sleep Efficiency was 23.4%.  Wake after Sleep Onset time was 198.0 minutes.  Titration Summary The patient was titrated at pressures ranging from 6* cm/H20 up to 14/9/0** cm/H20 -.  The last pressure used in the study was 14/9/0** cm/H20   Respiratory Events The polysomnogram revealed a presence of 7 obstructive, 27 central, and 2 mixed apneas resulting in an Apnea index of 21.6 events per hour.  There were 30 hypopneas (>=3% desaturation and/or arousal) resulting in an Apnea\Hypopnea Index (AHI >=3% desaturation and/or arousal) of 39.6 events per hour.  There were 9 hypopneas (>=4% desaturation) resulting in an Apnea\Hypopnea Index (AHI >=4% desaturation) of 27.0 events per hour.  There were 21 Respiratory Effort Related Arousals resulting in a  RERA index of 12.6 events per hour. The Respiratory Disturbance Index is 52.2 events per hour.  The snore index was 17.4 events per hour.  Mean oxygen saturation was 92.5%.  The lowest oxygen saturation during sleep was 87.0%.  Time spent <=88% oxygen saturation was 2.8 minutes (0.7%).  Limb Activity There were - limb movements recorded.  Of this total, - were classified as PLMs.  Of the PLMs, - were associated with arousals.  The Limb Movement index was - per hour while the PLM index was - per hour.  Cardiac Summary The average pulse rate was 61.2 bpm.  The minimum pulse rate was 53.0 bpm while the maximum pulse rate was 98.0 bpm.  Cardiac rhythm was normal/abnormal.  Comments: The patient slept for only 100 mins.. CPAP was started at a pressure of 6 cm H2O and was increased to 12 cm H2O. Due to persistent CSA, the patient was switched to bilevel at a starting pressure of 13/9 cm H2O and was increased to a pressure of 14/9cm H2O. The patient tolerated the pressures well.  Diagnosis:ModerateOSA Treatment emergent central apneas  Recommendations: Accept residual AHI of 10/h on CPAP downloads on current auto settings especially if patient is asymptomatic If patient reports non refreshing sleep, then consider switching to auto bilevel. This study was inadequate to predict final biPAP level   This study was personally reviewed and electronically signed by: JUDE HARDEN GAILS, MD Accredited Board Certified in Sleep Medicine Date/Time: 05/12/2024

## 2024-05-22 ENCOUNTER — Other Ambulatory Visit: Payer: Self-pay | Admitting: Internal Medicine

## 2024-05-27 ENCOUNTER — Encounter: Admitting: Internal Medicine

## 2024-06-09 ENCOUNTER — Ambulatory Visit (HOSPITAL_BASED_OUTPATIENT_CLINIC_OR_DEPARTMENT_OTHER)

## 2024-07-02 ENCOUNTER — Other Ambulatory Visit

## 2024-07-28 ENCOUNTER — Ambulatory Visit: Admitting: Podiatry

## 2024-09-30 ENCOUNTER — Ambulatory Visit
